# Patient Record
Sex: Female | Born: 1935 | Race: White | Hispanic: No | Smoking: Never smoker
Health system: Southern US, Community
[De-identification: ages and names within clinical notes are randomized; demographics above are authoritative.]

## PROBLEM LIST (undated history)

## (undated) DIAGNOSIS — H409 Unspecified glaucoma: Secondary | ICD-10-CM

## (undated) DIAGNOSIS — G4700 Insomnia, unspecified: Secondary | ICD-10-CM

## (undated) DIAGNOSIS — R1310 Dysphagia, unspecified: Secondary | ICD-10-CM

## (undated) DIAGNOSIS — E785 Hyperlipidemia, unspecified: Secondary | ICD-10-CM

## (undated) DIAGNOSIS — E039 Hypothyroidism, unspecified: Secondary | ICD-10-CM

## (undated) DIAGNOSIS — M542 Cervicalgia: Secondary | ICD-10-CM

## (undated) DIAGNOSIS — I639 Cerebral infarction, unspecified: Secondary | ICD-10-CM

## (undated) DIAGNOSIS — H9190 Unspecified hearing loss, unspecified ear: Secondary | ICD-10-CM

## (undated) DIAGNOSIS — M47812 Spondylosis without myelopathy or radiculopathy, cervical region: Secondary | ICD-10-CM

## (undated) DIAGNOSIS — IMO0001 Reserved for inherently not codable concepts without codable children: Secondary | ICD-10-CM

## (undated) DIAGNOSIS — K219 Gastro-esophageal reflux disease without esophagitis: Secondary | ICD-10-CM

## (undated) DIAGNOSIS — M47816 Spondylosis without myelopathy or radiculopathy, lumbar region: Secondary | ICD-10-CM

## (undated) DIAGNOSIS — I10 Essential (primary) hypertension: Secondary | ICD-10-CM

## (undated) DIAGNOSIS — I4891 Unspecified atrial fibrillation: Secondary | ICD-10-CM

## (undated) DIAGNOSIS — I4510 Unspecified right bundle-branch block: Secondary | ICD-10-CM

## (undated) DIAGNOSIS — R351 Nocturia: Secondary | ICD-10-CM

## (undated) DIAGNOSIS — M4180 Other forms of scoliosis, site unspecified: Secondary | ICD-10-CM

## (undated) DIAGNOSIS — G8929 Other chronic pain: Secondary | ICD-10-CM

## (undated) DIAGNOSIS — M1990 Unspecified osteoarthritis, unspecified site: Secondary | ICD-10-CM

## (undated) HISTORY — DX: Other chronic pain: G89.29

## (undated) HISTORY — DX: Other forms of scoliosis, site unspecified: M41.80

## (undated) HISTORY — PX: OTHER SURGICAL HISTORY: SHX169

## (undated) HISTORY — DX: Spondylosis without myelopathy or radiculopathy, cervical region: M47.812

## (undated) HISTORY — DX: Unspecified glaucoma: H40.9

## (undated) HISTORY — PX: BACK SURGERY: SHX140

## (undated) HISTORY — DX: Unspecified right bundle-branch block: I45.10

## (undated) HISTORY — DX: Unspecified hearing loss, unspecified ear: H91.90

## (undated) HISTORY — PX: BREAST SURGERY: SHX581

## (undated) HISTORY — DX: Spondylosis without myelopathy or radiculopathy, lumbar region: M47.816

## (undated) HISTORY — DX: Essential (primary) hypertension: I10

## (undated) HISTORY — DX: Insomnia, unspecified: G47.00

## (undated) HISTORY — DX: Hypothyroidism, unspecified: E03.9

## (undated) HISTORY — DX: Cerebral infarction, unspecified: I63.9

## (undated) HISTORY — DX: Nocturia: R35.1

## (undated) HISTORY — DX: Cervicalgia: M54.2

---

## 1998-07-08 HISTORY — PX: OTHER SURGICAL HISTORY: SHX169

## 1999-05-04 ENCOUNTER — Ambulatory Visit (HOSPITAL_BASED_OUTPATIENT_CLINIC_OR_DEPARTMENT_OTHER): Admission: RE | Admit: 1999-05-04 | Discharge: 1999-05-05 | Payer: Self-pay | Admitting: Plastic Surgery

## 2000-09-15 ENCOUNTER — Other Ambulatory Visit: Admission: RE | Admit: 2000-09-15 | Discharge: 2000-09-15 | Payer: Self-pay | Admitting: Internal Medicine

## 2000-09-19 ENCOUNTER — Encounter: Payer: Self-pay | Admitting: Ophthalmology

## 2000-09-23 ENCOUNTER — Ambulatory Visit (HOSPITAL_COMMUNITY): Admission: RE | Admit: 2000-09-23 | Discharge: 2000-09-23 | Payer: Self-pay | Admitting: Ophthalmology

## 2001-03-16 ENCOUNTER — Encounter: Admission: RE | Admit: 2001-03-16 | Discharge: 2001-03-16 | Payer: Self-pay | Admitting: Internal Medicine

## 2001-03-16 ENCOUNTER — Encounter: Payer: Self-pay | Admitting: Internal Medicine

## 2001-12-10 ENCOUNTER — Encounter: Admission: RE | Admit: 2001-12-10 | Discharge: 2001-12-10 | Payer: Self-pay | Admitting: Internal Medicine

## 2001-12-10 ENCOUNTER — Encounter: Payer: Self-pay | Admitting: Internal Medicine

## 2001-12-16 ENCOUNTER — Other Ambulatory Visit: Admission: RE | Admit: 2001-12-16 | Discharge: 2001-12-16 | Payer: Self-pay | Admitting: Gynecology

## 2002-06-07 ENCOUNTER — Encounter: Payer: Self-pay | Admitting: Urology

## 2002-06-08 ENCOUNTER — Encounter (INDEPENDENT_AMBULATORY_CARE_PROVIDER_SITE_OTHER): Payer: Self-pay

## 2002-06-08 ENCOUNTER — Ambulatory Visit (HOSPITAL_COMMUNITY): Admission: RE | Admit: 2002-06-08 | Discharge: 2002-06-08 | Payer: Self-pay | Admitting: Urology

## 2002-09-21 ENCOUNTER — Other Ambulatory Visit: Admission: RE | Admit: 2002-09-21 | Discharge: 2002-09-21 | Payer: Self-pay | Admitting: Internal Medicine

## 2003-01-19 ENCOUNTER — Encounter: Admission: RE | Admit: 2003-01-19 | Discharge: 2003-01-19 | Payer: Self-pay | Admitting: Neurosurgery

## 2003-01-19 ENCOUNTER — Encounter: Payer: Self-pay | Admitting: Neurosurgery

## 2003-02-16 ENCOUNTER — Encounter: Payer: Self-pay | Admitting: Neurosurgery

## 2003-02-21 ENCOUNTER — Inpatient Hospital Stay (HOSPITAL_COMMUNITY): Admission: RE | Admit: 2003-02-21 | Discharge: 2003-02-22 | Payer: Self-pay | Admitting: Neurosurgery

## 2003-02-21 ENCOUNTER — Encounter: Payer: Self-pay | Admitting: Neurosurgery

## 2003-03-24 ENCOUNTER — Encounter: Admission: RE | Admit: 2003-03-24 | Discharge: 2003-03-24 | Payer: Self-pay | Admitting: Neurosurgery

## 2003-03-24 ENCOUNTER — Encounter: Payer: Self-pay | Admitting: Neurosurgery

## 2003-06-09 ENCOUNTER — Encounter: Admission: RE | Admit: 2003-06-09 | Discharge: 2003-06-09 | Payer: Self-pay | Admitting: Neurosurgery

## 2004-10-11 ENCOUNTER — Other Ambulatory Visit: Admission: RE | Admit: 2004-10-11 | Discharge: 2004-10-11 | Payer: Self-pay | Admitting: Internal Medicine

## 2005-10-17 ENCOUNTER — Other Ambulatory Visit: Admission: RE | Admit: 2005-10-17 | Discharge: 2005-10-17 | Payer: Self-pay | Admitting: Internal Medicine

## 2007-11-16 ENCOUNTER — Other Ambulatory Visit: Admission: RE | Admit: 2007-11-16 | Discharge: 2007-11-16 | Payer: Self-pay | Admitting: Internal Medicine

## 2008-11-23 ENCOUNTER — Ambulatory Visit (HOSPITAL_COMMUNITY): Admission: RE | Admit: 2008-11-23 | Discharge: 2008-11-23 | Payer: Self-pay | Admitting: Gastroenterology

## 2010-05-23 ENCOUNTER — Other Ambulatory Visit: Admission: RE | Admit: 2010-05-23 | Discharge: 2010-05-23 | Payer: Self-pay | Admitting: Internal Medicine

## 2010-11-20 NOTE — Op Note (Signed)
[name], Keith                ACCOUNT NO.:  [id number]   MEDICAL RECORD NO.:  [id number]          PATIENT TYPE:  AMB   LOCATION:  ENDO                         FACILITY:  [facility]   PHYSICIAN:  Danise Edge, M.D.   DATE OF BIRTH:  [birth year]   DATE OF PROCEDURE:  DATE OF DISCHARGE:  11/23/2008                               OPERATIVE REPORT   REFERRING PHYSICIAN:  Candyce Churn, MD   PROCEDURE INDICATIONS:  [name] is a 77 year old female born  1929/09/11.  Ms. Holtrop has undergone an  esophagogastroduodenoscopy and diagnosed with an esophageal stricture in  the past.  She has developed esophageal dysphagia.   CHRONIC MEDICATION:  Levothyroxine, simvastatin, omeprazole, Diovan HCT,  Oxybutynin, Cosopt eye drops, Xalatan eye drops and Osteo Bi-Flex.   PAST MEDICAL HISTORY:  1. Hypercholesterolemia.  2. Hypertension.  3. Hypothyroidism.  4. Severe hearing loss.  5. Gastroesophageal reflux disease complicated by an esophageal      stricture.  6. Lumbar degenerative joint disease.  7. Glaucoma.  8. Laser surgery to the bladder to treat nocturia.   PAST SURGICAL HISTORY:  Bilateral saline breast implants, left carpal  tunnel surgery, facelift surgery, bilateral cataract surgery, lumbar  disk surgery, laser surgery of the bladder to treat nocturia.   MEDICATION ALLERGIES:  LIPITOR and ACE INHIBITORS.   ENDOSCOPIST:  Danise Edge, MD   PREMEDICATIONS:  Versed 2.5 mg and fentanyl 25 mcg.   PROCEDURE:  After obtaining informed consent, the patient was placed in  a left lateral decubitus position on the fluoroscopy table.  I  administered intravenous fentanyl and intravenous Versed to achieve  conscious sedation for the procedure.  The patient's blood pressure,  oxygen saturation and cardiac rhythm were monitored throughout the  procedure and documented in the medical record.   A Pentax gastroscope was passed through the posterior hypopharynx into  the  proximal esophagus without difficulty.  The hypopharynx, larynx and  vocal cords appeared normal.   ESOPHAGOSCOPY:  The proximal and mid segments of the esophagus appeared  normal.  At the esophagogastric junction (35 cm from the incisor teeth),  there is a short, benign-appearing peptic stricture.  There is no  endoscopic evidence for the presence of erosive esophagitis, Barrett  esophagus or esophageal tumor formation.   GASTROSCOPY:  There is a moderate-sized hiatal hernia.  Retroflex view  of the gastric cardia and fundus reveals a patulous diaphragmatic  hiatus.  The gastric body, antrum and pylorus appear normal.  The  pylorus is patent.   DUODENOSCOPY:  The duodenal bulb appears normal.  I was unable to  examine the second portion of the duodenum.   SAVARY ESOPHAGEAL DILATION:  The Savary dilator wire was passed through  the gastroscope and the tip of the guidewire advanced to the distal  gastric antrum.  Under fluoroscopic guidance, the 15-mm Savary dilator  passed without resistance.  Repeat esophagogastrostomy confirmed  satisfactory dilation of the benign peptic stricture at the  esophagogastric junction and no gastric trauma due to the guidewire.   ASSESSMENT:  Gastroesophageal reflux disease associated with a  moderate-  sized hiatal hernia and associated with a benign peptic stricture at the  esophagogastric junction (35 cm from the incisor teeth) dilated with the  15-mm Savary dilator.  There is no endoscopic evidence for the presence  of erosive esophagitis or Barrett esophagus.  The patient is on a proton  pump inhibitor.           ______________________________  Danise Edge, M.D.     MJ/MEDQ  D:  11/23/2008  T:  11/23/2008  Job:  [id number]   cc:   Candyce Churn, M.D.

## 2010-11-23 NOTE — Op Note (Signed)
NAME:  Angela Keith, Angela Keith                          ACCOUNT NO.:  [id number]   MEDICAL RECORD NO.:  [id number]                   PATIENT TYPE:  AMB   LOCATION:  DAY                                  [facility]ACILITY[facility]  [facility]   PHYSICIAN:  Jamison Neighbor, M.D.               DATE OF BIRTH:  Aug 08, 1929   DATE OF PROCEDURE:  06/08/2002  DATE OF DISCHARGE:                                 OPERATIVE REPORT   SERVICE:  Urology.   PREOPERATIVE DIAGNOSES:  Bladder lesion, rule out carcinoma in situ.   POSTOPERATIVE DIAGNOSES:  Bladder lesion, rule out carcinoma in situ.   PROCEDURE:  Cystoscopy, TURB.   SURGEON:  Jamison Neighbor, M.D.   ANESTHESIA:  General.   COMPLICATIONS:  None.   DRAINS:  Foley catheter.   BRIEF HISTORY:  This 77 year old female underwent a cystoscopic evaluation  because of hematuria. She was found to have a flat lesion on the right side  of the bladder back of the trigone. This was very flat and was felt to be  consistent with possible carcinoma in situ. The patient to undergo biopsy  and fulguration. She understands the risks and benefits of the procedure and  gave full and informed consent.   DESCRIPTION OF PROCEDURE:  After successful induction of general anesthesia,  the patient was placed in dorsal lithotomy position, prepped with Betadine  and draped in the usual sterile fashion. The bladder was carefully inspected  once again and the only area in question was the reddened patch on the right  hand side of the bladder. This appeared somewhat larger than it did when it  was evaluated with flexible cystoscope. There were no pedunculated tumors  and there were no other reddened patches anywhere else within the bladder.  The patient underwent resection of this area, the edges were carefully  fulgurated and all the reddened area was adequately resected. It was clear  that the resection had gone down into deeper tissues and because some fat  was seen it was felt the  patient should have a catheter for several days. A  Foley catheter was inserted after the chips had been irrigated. This was  placed to straight  drainage and ran freely. The patient tolerated the procedure well and was  taken to the recovery room in good condition. She will be sent home with  Darvocet-N 100, UroMax and Macrodantin one daily and will return to see [place]  in follow-up next week for catheter removal.                                               Jamison Neighbor, M.D.    RJE/MEDQ  D:  06/08/2002  T:  06/08/2002  Job:  [id number]   cc:   Maryln Gottron  Kevan Ny, M.D.  [address] [place]  [place] [address]  Fax: [phone]

## 2010-11-23 NOTE — Op Note (Signed)
NAME:  Angela Keith, Angela Keith                          ACCOUNT NO.:  [id number]   MEDICAL RECORD NO.:  [id number]                   PATIENT TYPE:  INP   LOCATION:  3172                                 FACILITY:  [facility]   PHYSICIAN:  Kathaleen Maser. Pool, M.D.                 DATE OF BIRTH:  01-28-1930   DATE OF PROCEDURE:  02/21/2003  DATE OF DISCHARGE:                                 OPERATIVE REPORT   PREOPERATIVE DIAGNOSES:  L4-5 degenerative spondylolisthesis with stenosis.   POSTOPERATIVE DIAGNOSES:  L4-5 degenerative spondylolisthesis with stenosis.   OPERATION PERFORMED:  L4-5 decompressive laminectomy with foraminotomies.  L4-5 posterior lumbar interbody fusion utilizing tangent wedges of local  autograft.  L4-5 posterolateral fusion utilizing pedicle screw  instrumentation and local autograft.   SURGEON:  Kathaleen Maser. Pool, M.D.   ASSISTANT:  Reinaldo Meeker, M.D.   ANESTHESIA:  General endotracheal.   INDICATIONS FOR PROCEDURE:  Angela Keith is a 77 year old female with a history  of severe back and bilateral lower extremity pain, right much greater than  left failing conservative management.  Work-up demonstrated evidence of  multiple level degenerative disk disease.  At L4-5 she has evidence of a  worsening grade 2 degenerative spondylolisthesis with severe stenosis.  The  patient presents now for decompression and fusion at the L4-5 level for  hopeful improvement in her symptoms.   DESCRIPTION OF PROCEDURE:  The patient was taken to the operating room and  placed on the table in the supine position.  After adequate level of  anesthesia was achieved, the patient was positioned prone onto a Wilson  frame and appropriately padded.  The patient's lumbar region was prepped and  draped sterilely.  A 10 blade was used to make a linear skin incision  overlying the L3, 4, and 5 levels.  This was carried down sharply in the  midline.  A subperiosteal dissection was then performed exposing the  lamina  and facet joints of L3, L4 and L5 as well as the transverse processes of L4  and L5.  Deep self-retaining retractor was placed.  Intraoperative x-ray was  taken, the levels were confirmed.  Decompressive laminectomy was then  performed at L4-5 utilizing Leksell rongeurs, Kerrison rongeurs and high  speed drill to completely remove the lamina at L4, remove the superior one  third of  the lamina at L5, completely remove the inferior facets at L4 and  completely remove the superior facets of L5.  All bone was cleaned and used  in later autografting.  The ligamentum flavum was then elevated and resected  in piecemeal fashion using Kerrison rongeurs.  The underlying thecal sac and  exiting L4 and L5 nerve roots were identified.  A wide foraminotomy was  performed along their courses.  Starting first at L4-5, epidural venous  plexus was coagulated and cut.  Thecal sac and nerve roots were protected on  the right.  Disk space was incised with a 15 blade in rectangular fashion.  A wide disk space cleanout was then achieved using pituitary rongeurs and  upward angled pituitary rongeurs and Epstein curets.  The procedure was then  repeated on the contralateral side.  Disk space was then sequentially  dilated up to 10 mm.  A 10 mm distractor was left on the right.  The nerve  root was protected on the left.  The disk space was then reamed and then cut  with 10 mm tangent instruments.  Soft tissue was removed from the  interspace.  A 10 x 26 mm tangent wedge was then impacted in place and  recessed approximately 1 mm from the posterior cortical surface.  Distractor  was removed from the contralateral side.  The thecal sac and nerve roots  were then protected on the right.  Disk space once again reamed and then  cut.  Soft tissue was removed.  Disk space further curettaged.  Morcelized  autograft was then packed into the interspace and then a second tangent  wedge was also packed and recessed  approximately 2 mm from the posterior  cortical margin.  The patient's spondylolisthesis was markedly reduced.  Pedicles at L4 and L5 were then isolated using surface landmarks and  intraoperative fluoroscopy.  Superficial bone overlying the pedicle was  removed using the high speed drill.  Each pedicle was then probed using  pedicle awls.  Each pedicle awl tract was found to be solidly within bone.  Each pedicle awl tract was then tapped with a 5.2 mm screw tap.  Each screw  tap hole was then probed and found to be solidly within bone.  6.75 x 45 mm  spiral 90 screws were placed bilaterally at L4.  6.75 x 40 mm screws were  placed bilaterally at L5.  Transverse processes at L4 and L5 were then  decorticated using a high speed drill.  Morcelized autograft was packed  posterolaterally.  Short segment titanium rod was then placed over the screw  heads of L4 and L5.  Locking caps were then placed over the screw heads.  The caps were then engaged in sequential fashion with the construct under  compression.  Gelfoam and a medium Hemovac drain was then left in the  interspace.  Prior to this a blunt probe was passed easily along the course  of the exiting nerve roots.  There was no evidence of injury to thecal sac  or nerve roots.  Final images revealed good position of bone grafts and  hardware at the proper operative level and normal alignment of the spine.  The wound was then irrigated one final time.  Then closed in routine  fashion.  Steri-Strips and sterile dressing were applied.  There were no  apparent complications.  The patient tolerated the procedure well and  returned to the recovery room postoperatively.                                                Henry A. Pool, M.D.    HAP/MEDQ  D:  02/21/2003  T:  02/21/2003  Job:  [id number]

## 2010-11-23 NOTE — Op Note (Signed)
[place]  Patient:    Angela Keith, Angela Keith                       MRN: [id number] Proc. Date: 09/23/00 Adm. Date:  [id number] Disc. Date: [id number] Attending:  Ivor Messier CC:         [name], M.D.   Operative Report  PREOPERATIVE DIAGNOSIS: Senile cataract, left eye.  POSTOPERATIVE DIAGNOSIS: Senile cataract, left eye.  OPERATION/PROCEDURE: Planned extracapsular cataract extraction with primary insertion of posterior chamber intraocular lens implant.  SURGEON: Guadelupe Sabin, M.D.  ASSISTANT: Nurse.  ANESTHESIA: Local 4% xylocaine, 0.75% Marcaine.  Anesthesia standby required. The patient was given sodium pentothal during the period of retrobulbar injection.  DESCRIPTION OF PROCEDURE: After the patient was prepped and draped a speculum was inserted in the left eye.  The eye was turned downward and a superior rectus traction suture placed.  Schiotz tonometry was recorded at 6-7 scale units with a 5.5 g weight.  A peritomy was performed adjacent to the limbus from the 11 oclock to the one oclock position.  The corneoscleral junction was cleaned and a corneoscleral groove made with a 45 degree Superblade.  The anterior chamber was then entered with the 2.5 mm diamond keratome.  The patient then had a circular capsulorrhexis performed with the Grabow forceps. Hydrodissection and hydrodelineation were performed using 1% xylocaine.  The 30 degree phacoemulsification tip was then inserted with slow emulsification of the lens nucleus.  Total ultrasonic time was one minute and 36 seconds. Average power level was ? 42%.  Fluid used was 80 ccs.  Following removal of the nucleus the residual cortex was aspirated with the irrigation-aspiration tip.  The posterior capsule appeared intact with a brilliant red fundus reflex.  It was therefore elected to insert an [employer] SI40MB silicone three-piece posterior chamber  intraocular lens implant with UV absorber.  Diopter strength was +25.00.  This was inserted with the [employer] forceps into the anterior chamber and then into the capsular bag with a Lister lens rotator.  The lens appeared to be well centered.  The Healon which had been used during the procedure was then aspirated and replaced with balanced salt solution and Miochol ophthalmic solution.  Maxitrol ointment was instilled in the conjunctival cul-de-sac and a light patch and protective shield applied.  Duration of the procedure and anesthesia administration was 45 minutes.  The patient tolerated the procedure well in general and left the operating room for the recovery room in good condition. DD:  09/23/00 TD:  09/23/00 Job: [id number]

## 2010-11-23 NOTE — H&P (Signed)
[place]  Patient:    Angela Keith                       MRN: [id number] Adm. Date:  [id number] Disc. Date: [id number] Attending:  Ivor Messier CC:         [name], M.D.   History and Physical  CHIEF COMPLAINT: This was a planned outpatient surgical admission of this 77 year old white female, admitted for cataract implant surgery of the left eye.  HISTORY OF PRESENT ILLNESS: This patient has noted gradual deterioration of vision in both eyes due to cataract formation.  The patient also has chronic open angle glaucoma in both eyes.  The patient has undergone previous cataract implant surgery by Dr. Antony Contras at the surgical center on Nov 26, 1999. The patient tolerated this procedure well and has done well since the operative procedure.  The unoperated eye has slowly deteriorated to 20/50 and the patient has elected to proceed with similar surgery of the left eye at this time.  She signed an informed consent and arrangements were made for her outpatient admission.  PAST MEDICAL HISTORY: The patient is in stable general health, under the care of her regular physician, Dr. Robley Fries.  She is said to be in satisfactory condition for the proposed surgery.  Dr. Kevan Ny notes the patients allergy to LIPITOR.  He is following the patient for hypothyroidism, hyperlipidemia, gastric reflux syndrome, history of right bundle branch block, and severe hearing loss.  ALLERGIES: LIPITOR.  REVIEW OF SYSTEMS: No cardiorespiratory complaints.  PHYSICAL EXAMINATION:  VITAL SIGNS: As recorded on admission, blood pressure was 156/76, temperature 97.6 degrees, heart rate 60.  GENERAL: The patient is a pleasant, well-developed, well-nourished white female in no acute distress.  HEENT: Ocular examination shows the eyes are white and clear.  Slit lamp examination reveals nuclear cataract formation in the left eye and a  clear posterior chamber intraocular lens implant in the right eye.  Applanation tonometry is stable at 19 mm in each eye.  Funduscopic examination reveals a clear vitreous and attached retina.  The optic nerve shows glaucomatous cupping, disc cup ratio 0.08 right eye and 0.06 left eye.  The blood vessels, macula, and retina appear normal.  CHEST: Lungs clear to auscultation and percussion.  HEART: Normal sinus rhythm.  No cardiomegaly.  No murmurs.  ABDOMEN: Negative.  EXTREMITIES: Negative.  ADMISSION DIAGNOSIS:  1. Senile cataract, left eye.  2. Pseudophakia, right eye.  SURGICAL PLAN: Cataract implant surgery, left eye. DD:  09/23/00 TD:  09/23/00 Job: [id number]

## 2011-07-11 DIAGNOSIS — Z1231 Encounter for screening mammogram for malignant neoplasm of breast: Secondary | ICD-10-CM | POA: Diagnosis not present

## 2011-11-20 DIAGNOSIS — E039 Hypothyroidism, unspecified: Secondary | ICD-10-CM | POA: Diagnosis not present

## 2011-11-20 DIAGNOSIS — I10 Essential (primary) hypertension: Secondary | ICD-10-CM | POA: Diagnosis not present

## 2011-11-20 DIAGNOSIS — E7800 Pure hypercholesterolemia, unspecified: Secondary | ICD-10-CM | POA: Diagnosis not present

## 2011-11-20 DIAGNOSIS — H9190 Unspecified hearing loss, unspecified ear: Secondary | ICD-10-CM | POA: Diagnosis not present

## 2011-12-19 DIAGNOSIS — R3915 Urgency of urination: Secondary | ICD-10-CM | POA: Diagnosis not present

## 2011-12-19 DIAGNOSIS — Z1331 Encounter for screening for depression: Secondary | ICD-10-CM | POA: Diagnosis not present

## 2011-12-19 DIAGNOSIS — R350 Frequency of micturition: Secondary | ICD-10-CM | POA: Diagnosis not present

## 2011-12-19 DIAGNOSIS — M5430 Sciatica, unspecified side: Secondary | ICD-10-CM | POA: Diagnosis not present

## 2012-02-12 DIAGNOSIS — H4011X0 Primary open-angle glaucoma, stage unspecified: Secondary | ICD-10-CM | POA: Diagnosis not present

## 2012-02-12 DIAGNOSIS — H409 Unspecified glaucoma: Secondary | ICD-10-CM | POA: Diagnosis not present

## 2012-02-24 DIAGNOSIS — H4011X0 Primary open-angle glaucoma, stage unspecified: Secondary | ICD-10-CM | POA: Diagnosis not present

## 2012-02-24 DIAGNOSIS — H353190 Nonexudative age-related macular degeneration, unspecified eye, stage unspecified: Secondary | ICD-10-CM | POA: Diagnosis not present

## 2012-03-11 DIAGNOSIS — M1710 Unilateral primary osteoarthritis, unspecified knee: Secondary | ICD-10-CM | POA: Diagnosis not present

## 2012-03-16 DIAGNOSIS — R079 Chest pain, unspecified: Secondary | ICD-10-CM | POA: Diagnosis not present

## 2012-04-01 DIAGNOSIS — I4510 Unspecified right bundle-branch block: Secondary | ICD-10-CM | POA: Diagnosis not present

## 2012-04-01 DIAGNOSIS — R609 Edema, unspecified: Secondary | ICD-10-CM | POA: Diagnosis not present

## 2012-04-01 DIAGNOSIS — I10 Essential (primary) hypertension: Secondary | ICD-10-CM | POA: Diagnosis not present

## 2012-04-01 DIAGNOSIS — R072 Precordial pain: Secondary | ICD-10-CM | POA: Diagnosis not present

## 2012-04-01 DIAGNOSIS — E782 Mixed hyperlipidemia: Secondary | ICD-10-CM | POA: Diagnosis not present

## 2012-04-08 DIAGNOSIS — R072 Precordial pain: Secondary | ICD-10-CM | POA: Diagnosis not present

## 2012-04-08 DIAGNOSIS — I4510 Unspecified right bundle-branch block: Secondary | ICD-10-CM | POA: Diagnosis not present

## 2012-04-08 DIAGNOSIS — I10 Essential (primary) hypertension: Secondary | ICD-10-CM | POA: Diagnosis not present

## 2012-04-08 DIAGNOSIS — R609 Edema, unspecified: Secondary | ICD-10-CM | POA: Diagnosis not present

## 2012-04-08 DIAGNOSIS — E782 Mixed hyperlipidemia: Secondary | ICD-10-CM | POA: Diagnosis not present

## 2012-04-13 DIAGNOSIS — E782 Mixed hyperlipidemia: Secondary | ICD-10-CM | POA: Diagnosis not present

## 2012-04-13 DIAGNOSIS — Z0000 Encounter for general adult medical examination without abnormal findings: Secondary | ICD-10-CM | POA: Diagnosis not present

## 2012-04-13 DIAGNOSIS — R1310 Dysphagia, unspecified: Secondary | ICD-10-CM | POA: Diagnosis not present

## 2012-04-13 DIAGNOSIS — Z23 Encounter for immunization: Secondary | ICD-10-CM | POA: Diagnosis not present

## 2012-04-13 DIAGNOSIS — I4510 Unspecified right bundle-branch block: Secondary | ICD-10-CM | POA: Diagnosis not present

## 2012-04-13 DIAGNOSIS — I10 Essential (primary) hypertension: Secondary | ICD-10-CM | POA: Diagnosis not present

## 2012-04-13 DIAGNOSIS — M1710 Unilateral primary osteoarthritis, unspecified knee: Secondary | ICD-10-CM | POA: Diagnosis not present

## 2012-04-13 DIAGNOSIS — R072 Precordial pain: Secondary | ICD-10-CM | POA: Diagnosis not present

## 2012-06-01 ENCOUNTER — Other Ambulatory Visit: Payer: Self-pay | Admitting: Internal Medicine

## 2012-06-01 ENCOUNTER — Other Ambulatory Visit (HOSPITAL_COMMUNITY)
Admission: RE | Admit: 2012-06-01 | Discharge: 2012-06-01 | Disposition: A | Discharge status: Home / Self Care | Payer: Medicare Other | Source: Ambulatory Visit | Attending: Internal Medicine | Admitting: Internal Medicine

## 2012-06-01 DIAGNOSIS — Z1151 Encounter for screening for human papillomavirus (HPV): Secondary | ICD-10-CM | POA: Insufficient documentation

## 2012-06-01 DIAGNOSIS — I4510 Unspecified right bundle-branch block: Secondary | ICD-10-CM | POA: Diagnosis not present

## 2012-06-01 DIAGNOSIS — Z01419 Encounter for gynecological examination (general) (routine) without abnormal findings: Secondary | ICD-10-CM | POA: Diagnosis not present

## 2012-06-01 DIAGNOSIS — E782 Mixed hyperlipidemia: Secondary | ICD-10-CM | POA: Diagnosis not present

## 2012-06-01 DIAGNOSIS — R1310 Dysphagia, unspecified: Secondary | ICD-10-CM | POA: Diagnosis not present

## 2012-06-01 DIAGNOSIS — Z0000 Encounter for general adult medical examination without abnormal findings: Secondary | ICD-10-CM | POA: Diagnosis not present

## 2012-06-01 DIAGNOSIS — Z1331 Encounter for screening for depression: Secondary | ICD-10-CM | POA: Diagnosis not present

## 2012-06-01 DIAGNOSIS — E039 Hypothyroidism, unspecified: Secondary | ICD-10-CM | POA: Diagnosis not present

## 2012-06-01 DIAGNOSIS — Z79899 Other long term (current) drug therapy: Secondary | ICD-10-CM | POA: Diagnosis not present

## 2012-06-01 DIAGNOSIS — I10 Essential (primary) hypertension: Secondary | ICD-10-CM | POA: Diagnosis not present

## 2012-06-01 DIAGNOSIS — R072 Precordial pain: Secondary | ICD-10-CM | POA: Diagnosis not present

## 2012-06-01 DIAGNOSIS — M1710 Unilateral primary osteoarthritis, unspecified knee: Secondary | ICD-10-CM | POA: Diagnosis not present

## 2012-06-08 ENCOUNTER — Ambulatory Visit (HOSPITAL_COMMUNITY)
Admission: RE | Admit: 2012-06-08 | Discharge: 2012-06-08 | Disposition: A | Discharge status: Home / Self Care | Payer: Medicare Other | Source: Ambulatory Visit | Attending: Gastroenterology | Admitting: Gastroenterology

## 2012-06-08 ENCOUNTER — Encounter (HOSPITAL_COMMUNITY)
Admission: RE | Disposition: A | Discharge status: Home / Self Care | Payer: Self-pay | Source: Ambulatory Visit | Attending: Gastroenterology

## 2012-06-08 ENCOUNTER — Encounter (HOSPITAL_COMMUNITY): Payer: Self-pay | Admitting: *Deleted

## 2012-06-08 DIAGNOSIS — Z79899 Other long term (current) drug therapy: Secondary | ICD-10-CM | POA: Diagnosis not present

## 2012-06-08 DIAGNOSIS — H409 Unspecified glaucoma: Secondary | ICD-10-CM | POA: Diagnosis not present

## 2012-06-08 DIAGNOSIS — K222 Esophageal obstruction: Secondary | ICD-10-CM | POA: Insufficient documentation

## 2012-06-08 DIAGNOSIS — I10 Essential (primary) hypertension: Secondary | ICD-10-CM | POA: Diagnosis not present

## 2012-06-08 DIAGNOSIS — K449 Diaphragmatic hernia without obstruction or gangrene: Secondary | ICD-10-CM | POA: Insufficient documentation

## 2012-06-08 DIAGNOSIS — K219 Gastro-esophageal reflux disease without esophagitis: Secondary | ICD-10-CM | POA: Insufficient documentation

## 2012-06-08 DIAGNOSIS — Z7982 Long term (current) use of aspirin: Secondary | ICD-10-CM | POA: Diagnosis not present

## 2012-06-08 DIAGNOSIS — E7800 Pure hypercholesterolemia, unspecified: Secondary | ICD-10-CM | POA: Insufficient documentation

## 2012-06-08 DIAGNOSIS — E039 Hypothyroidism, unspecified: Secondary | ICD-10-CM | POA: Insufficient documentation

## 2012-06-08 HISTORY — DX: Hypothyroidism, unspecified: E03.9

## 2012-06-08 HISTORY — DX: Gastro-esophageal reflux disease without esophagitis: K21.9

## 2012-06-08 HISTORY — DX: Hyperlipidemia, unspecified: E78.5

## 2012-06-08 HISTORY — DX: Dysphagia, unspecified: R13.10

## 2012-06-08 HISTORY — PX: ESOPHAGOGASTRODUODENOSCOPY (EGD) WITH ESOPHAGEAL DILATION: SHX5812

## 2012-06-08 HISTORY — DX: Unspecified osteoarthritis, unspecified site: M19.90

## 2012-06-08 SURGERY — ESOPHAGOGASTRODUODENOSCOPY (EGD) WITH ESOPHAGEAL DILATION
Anesthesia: Moderate Sedation

## 2012-06-08 MED ORDER — SODIUM CHLORIDE 0.9 % IV SOLN: INTRAVENOUS | Status: DC

## 2012-06-08 MED ORDER — SODIUM CHLORIDE 0.9 % IV SOLN
INTRAVENOUS | Status: DC
  Administered 2012-06-08: 500 mL via INTRAVENOUS

## 2012-06-08 MED ORDER — BUTAMBEN-TETRACAINE-BENZOCAINE 2-2-14 % EX AERO
INHALATION_SPRAY | CUTANEOUS | Status: DC | PRN
  Administered 2012-06-08: 1 via TOPICAL

## 2012-06-08 MED ORDER — FENTANYL CITRATE 0.05 MG/ML IJ SOLN
INTRAMUSCULAR | Status: DC | PRN
  Administered 2012-06-08: 25 ug via INTRAVENOUS

## 2012-06-08 MED ORDER — MIDAZOLAM HCL 10 MG/2ML IJ SOLN
INTRAMUSCULAR | Status: AC
  Filled 2012-06-08: qty 2

## 2012-06-08 MED ORDER — FENTANYL CITRATE 0.05 MG/ML IJ SOLN
INTRAMUSCULAR | Status: AC
  Filled 2012-06-08: qty 2

## 2012-06-08 MED ORDER — MIDAZOLAM HCL 10 MG/2ML IJ SOLN
INTRAMUSCULAR | Status: DC | PRN
  Administered 2012-06-08: 1 mg via INTRAVENOUS
  Administered 2012-06-08: 1.5 mg via INTRAVENOUS
  Administered 2012-06-08: 1 mg via INTRAVENOUS

## 2012-06-08 NOTE — H&P (Signed)
  Problem: Esophageal dysphagia associated with a benign distal esophageal stricture.  History: The patient is a 78 year old female born 07-Feb-1930.  The patient has a benign distal esophageal stricture which last required esophageal dilation in May 2010. There is no history of Barrett's esophagus.  The patient has developed solid food esophageal dysphagia without odynophagia. She is taking a proton pump inhibitor.  The patient is scheduled to undergo diagnostic esophagogastroduodenoscopy with balloon dilation of the distal esophageal stricture.  Medication allergies: Lipitor causes mood swings. ACE inhibitors cause fatigue.  Chronic medication: Aspirin. Calcium. Vitamin D. Oxybutynin. Levothyroxin. Ambien. Nasonex. Xalatan. Cosopt. Omeprazole. Nitroglycerin. Metoprolol. Diovan.  Past medical and surgical history: Hypercholesterolemia. Hypertension. Hypothyroidism. Bilateral hearing loss. Distal esophageal stricture associated with gastroesophageal reflux. Lumbar degenerative joint disease. Lumbar surgery. Glaucoma. Insomnia. Bilateral saline breast implants. Left carpal tunnel surgery. Facelift and eyelid surgery. Bilateral cataract surgery. Laser surgery of the urinary bladder.  Exam: The patient is alert and lying comfortably on the endoscopy stretcher. Cardiac exam reveals a regular rhythm. Lungs are clear to auscultation. Abdomen is soft, flat, and nontender to palpation in all quadrants.  Plan: Proceed with diagnostic esophagogastroduodenoscopy and dilation of the benign distal esophageal stricture.

## 2012-06-08 NOTE — Op Note (Signed)
Procedure: Diagnostic esophagogastroduodenoscopy with balloon dilation of a distal esophageal Schatzki's ring at the esophagogastric junction.  Endoscopist: Danise Edge  Premedication: Fentanyl 25 mcg intravenously. Versed 3.5 mg intravenously. Cetacaine spray.  Procedure: The patient was placed in the left lateral decubitus position. The Pentax gastroscope was passed through the posterior hypopharynx into the proximal esophagus without difficulty. The hypopharynx, larynx, and vocal cords appeared normal.  Esophagoscopy: The proximal and mid segments of the esophageal mucosa appear normal. The squamocolumnar junction is regular and noted at 30 cm from the incisor teeth. There is a shallow Schatzki's ring at the esophagogastric junction. There is no endoscopic evidence for the presence of erosive esophagitis, Barrett's esophagus, or esophageal cancer. Using the esophageal balloon dilator, the benign Schatzki's ring at the esophagogastric junction was dilated to 16.5 mm without apparent complications.  Gastroscopy: There is a large hiatal hernia. Retroflexed view of the gastric cardia and fundus was normal. The gastric body, antrum, and pylorus appeared normal. There were no Sheria Lang gastric erosions at the diaphragmatic hiatus.  Duodenoscopy: The duodenal bulb and descending duodenum appeared normal.  Assessment: Chronic gastroesophageal reflux associated with a large hiatal hernia and shallow Schatzki's ring at the esophagogastric junction dilated to 16.5 mm using the esophageal balloon dilator.

## 2012-06-09 ENCOUNTER — Encounter (HOSPITAL_COMMUNITY): Payer: Self-pay

## 2012-06-09 ENCOUNTER — Encounter (HOSPITAL_COMMUNITY): Payer: Self-pay | Admitting: Gastroenterology

## 2012-08-25 DIAGNOSIS — M1710 Unilateral primary osteoarthritis, unspecified knee: Secondary | ICD-10-CM | POA: Diagnosis not present

## 2012-09-07 DIAGNOSIS — H353190 Nonexudative age-related macular degeneration, unspecified eye, stage unspecified: Secondary | ICD-10-CM | POA: Diagnosis not present

## 2012-09-07 DIAGNOSIS — H4011X0 Primary open-angle glaucoma, stage unspecified: Secondary | ICD-10-CM | POA: Diagnosis not present

## 2012-10-28 DIAGNOSIS — J209 Acute bronchitis, unspecified: Secondary | ICD-10-CM | POA: Diagnosis not present

## 2012-10-29 ENCOUNTER — Inpatient Hospital Stay (HOSPITAL_BASED_OUTPATIENT_CLINIC_OR_DEPARTMENT_OTHER)
Admission: EM | Admit: 2012-10-29 | Discharge: 2012-10-31 | DRG: 391 | Disposition: A | Discharge status: Home / Self Care | Payer: Medicare Other | Attending: Internal Medicine | Admitting: Internal Medicine

## 2012-10-29 ENCOUNTER — Encounter (HOSPITAL_BASED_OUTPATIENT_CLINIC_OR_DEPARTMENT_OTHER): Payer: Self-pay | Admitting: Emergency Medicine

## 2012-10-29 DIAGNOSIS — E039 Hypothyroidism, unspecified: Secondary | ICD-10-CM | POA: Diagnosis present

## 2012-10-29 DIAGNOSIS — K219 Gastro-esophageal reflux disease without esophagitis: Secondary | ICD-10-CM | POA: Diagnosis present

## 2012-10-29 DIAGNOSIS — R112 Nausea with vomiting, unspecified: Secondary | ICD-10-CM | POA: Diagnosis not present

## 2012-10-29 DIAGNOSIS — Z79899 Other long term (current) drug therapy: Secondary | ICD-10-CM | POA: Diagnosis not present

## 2012-10-29 DIAGNOSIS — E860 Dehydration: Secondary | ICD-10-CM | POA: Diagnosis present

## 2012-10-29 DIAGNOSIS — E876 Hypokalemia: Secondary | ICD-10-CM | POA: Diagnosis not present

## 2012-10-29 DIAGNOSIS — R748 Abnormal levels of other serum enzymes: Secondary | ICD-10-CM | POA: Diagnosis not present

## 2012-10-29 DIAGNOSIS — K449 Diaphragmatic hernia without obstruction or gangrene: Secondary | ICD-10-CM | POA: Diagnosis not present

## 2012-10-29 DIAGNOSIS — Z7982 Long term (current) use of aspirin: Secondary | ICD-10-CM | POA: Diagnosis not present

## 2012-10-29 DIAGNOSIS — R1310 Dysphagia, unspecified: Secondary | ICD-10-CM | POA: Diagnosis present

## 2012-10-29 DIAGNOSIS — E785 Hyperlipidemia, unspecified: Secondary | ICD-10-CM | POA: Diagnosis present

## 2012-10-29 DIAGNOSIS — H9190 Unspecified hearing loss, unspecified ear: Secondary | ICD-10-CM | POA: Diagnosis present

## 2012-10-29 DIAGNOSIS — K859 Acute pancreatitis without necrosis or infection, unspecified: Secondary | ICD-10-CM | POA: Diagnosis not present

## 2012-10-29 DIAGNOSIS — M129 Arthropathy, unspecified: Secondary | ICD-10-CM | POA: Diagnosis present

## 2012-10-29 DIAGNOSIS — R197 Diarrhea, unspecified: Secondary | ICD-10-CM | POA: Diagnosis present

## 2012-10-29 DIAGNOSIS — I10 Essential (primary) hypertension: Secondary | ICD-10-CM | POA: Diagnosis present

## 2012-10-29 DIAGNOSIS — T368X5A Adverse effect of other systemic antibiotics, initial encounter: Secondary | ICD-10-CM | POA: Diagnosis present

## 2012-10-29 DIAGNOSIS — I4510 Unspecified right bundle-branch block: Secondary | ICD-10-CM | POA: Diagnosis not present

## 2012-10-29 DIAGNOSIS — R05 Cough: Secondary | ICD-10-CM | POA: Clinically undetermined

## 2012-10-29 LAB — COMPREHENSIVE METABOLIC PANEL
ALT: 21 U/L (ref 0–35)
AST: 24 U/L (ref 0–37)
Albumin: 4.2 g/dL (ref 3.5–5.2)
Alkaline Phosphatase: 62 U/L (ref 39–117)
Chloride: 103 mEq/L (ref 96–112)
Potassium: 3.6 mEq/L (ref 3.5–5.1)
Total Bilirubin: 0.4 mg/dL (ref 0.3–1.2)

## 2012-10-29 LAB — CBC WITH DIFFERENTIAL/PLATELET
Basophils Absolute: 0 10*3/uL (ref 0.0–0.1)
Basophils Relative: 0 % (ref 0–1)
Hemoglobin: 12.7 g/dL (ref 12.0–15.0)
Lymphocytes Relative: 11 % — ABNORMAL LOW (ref 12–46)
MCHC: 33.4 g/dL (ref 30.0–36.0)
Monocytes Relative: 4 % (ref 3–12)
Neutro Abs: 8.4 10*3/uL — ABNORMAL HIGH (ref 1.7–7.7)
Neutrophils Relative %: 84 % — ABNORMAL HIGH (ref 43–77)
RDW: 14.8 % (ref 11.5–15.5)
WBC: 9.9 10*3/uL (ref 4.0–10.5)

## 2012-10-29 MED ORDER — ONDANSETRON HCL 4 MG/2ML IJ SOLN
4.0000 mg | Freq: Once | INTRAMUSCULAR | Status: AC
  Administered 2012-10-29: 4 mg via INTRAVENOUS
  Filled 2012-10-29: qty 2

## 2012-10-29 MED ORDER — SODIUM CHLORIDE 0.9 % IV BOLUS (SEPSIS)
1000.0000 mL | Freq: Once | INTRAVENOUS | Status: AC
  Administered 2012-10-29: 1000 mL via INTRAVENOUS

## 2012-10-29 NOTE — ED Provider Notes (Signed)
History     CSN: [id number]  Arrival date & time 10/29/12  1941   First MD Initiated Contact with Patient 10/29/12 2008      Chief Complaint  Patient presents with  . Nausea  . Diarrhea  . Vomiting    (Consider location/radiation/quality/duration/timing/severity/associated sxs/prior treatment) HPI Comments: Patient brought by family for evaluation of a 12 hour history of n/v/d.  All have been non-bloody.  She denies abdominal pain.  No fevers or chills.  No urinary complaints.  Her husband passed away last week and she has had many family members from out of town, however none have been ill.    Patient is a 79 y.o. female presenting with vomiting. The history is provided by the patient.  Emesis Severity:  Moderate Duration:  12 hours Timing:  Constant Quality:  Stomach contents Progression:  Worsening Chronicity:  New Recent urination:  Normal Relieved by:  Nothing Worsened by:  Nothing tried Ineffective treatments:  None tried Associated symptoms: diarrhea   Associated symptoms: no abdominal pain, no chills, no fever and no headaches     Past Medical History  Diagnosis Date  . Hypothyroidism   . Arthritis   . GERD (gastroesophageal reflux disease)   . Dysphagia   . Hyperlipidemia   . Hypertension     Past Surgical History  Procedure Laterality Date  . Breast surgery    . Back surgery      lower back   . Esophagogastroduodenoscopy (egd) with esophageal dilation  06/08/2012    Procedure: ESOPHAGOGASTRODUODENOSCOPY (EGD) WITH ESOPHAGEAL DILATION;  Surgeon: Charolett Bumpers, MD;  Location: [facility];  Service: Endoscopy;  Laterality: N/A;    History reviewed. No pertinent family history.  History  Substance Use Topics  . Smoking status: Never Smoker   . Smokeless tobacco: Not on file  . Alcohol Use:     OB History   Grav Para Term Preterm Abortions TAB SAB Ect Mult Living                  Review of Systems  Constitutional: Negative for chills.   Gastrointestinal: Positive for vomiting and diarrhea. Negative for abdominal pain.  Neurological: Negative for headaches.  All other systems reviewed and are negative.    Allergies  Motrin  Home Medications   Current Outpatient Rx  Name  Route  Sig  Dispense  Refill  . Fluticasone-Salmeterol (ADVAIR) 100-50 MCG/DOSE AEPB   Inhalation   Inhale 1 puff into the lungs every 12 (twelve) hours.         Marland Kitchen levofloxacin (LEVAQUIN) 250 MG tablet   Oral   Take 250 mg by mouth daily.         Marland Kitchen aspirin 81 MG tablet   Oral   Take 81 mg by mouth daily.         . calcium carbonate (OS-CAL) 600 MG TABS   Oral   Take 600 mg by mouth daily.         . dorzolamide-timolol (COSOPT) 22.3-6.8 MG/ML ophthalmic solution   Both Eyes   Place 1 drop into both eyes 2 (two) times daily.         Marland Kitchen latanoprost (XALATAN) 0.005 % ophthalmic solution   Both Eyes   Place 1 drop into both eyes at bedtime.         Marland Kitchen levothyroxine (SYNTHROID, LEVOTHROID) 75 MCG tablet   Oral   Take 75 mcg by mouth daily.         Marland Kitchen  Multiple Vitamins-Minerals (ICAPS PO)   Oral   Take by mouth daily.         . nitroGLYCERIN (NITROSTAT) 0.4 MG SL tablet   Sublingual   Place 0.4 mg under the tongue as directed.         Marland Kitchen omeprazole (PRILOSEC) 20 MG capsule   Oral   Take 20 mg by mouth 2 (two) times daily.         Marland Kitchen oxybutynin (DITROPAN-XL) 10 MG 24 hr tablet   Oral   Take 10 mg by mouth daily.         . valsartan (DIOVAN) 80 MG tablet   Oral   Take 80 mg by mouth daily.           BP 153/70  Pulse 80  Temp(Src) 97.7 F (36.5 C) (Oral)  Resp 14  Ht 5\' 7"  (1.702 m)  Wt 160 lb (72.576 kg)  BMI 25.05 kg/m2  SpO2 98%  Physical Exam  Nursing note and vitals reviewed. Constitutional: She is oriented to person, place, and time. She appears well-developed and well-nourished. No distress.  She does appear somewhat pale.  HENT:  Head: Normocephalic and atraumatic.  Mouth/Throat:  Oropharynx is clear and moist.  Neck: Normal range of motion. Neck supple.  Cardiovascular: Normal rate and regular rhythm.  Exam reveals no gallop and no friction rub.   No murmur heard. Pulmonary/Chest: Effort normal and breath sounds normal. No respiratory distress. She has no wheezes.  Abdominal: Soft. Bowel sounds are normal. She exhibits no distension. There is no tenderness.  Musculoskeletal: Normal range of motion.  Neurological: She is alert and oriented to person, place, and time.  Skin: Skin is warm and dry. She is not diaphoretic.    ED Course  Procedures (including critical care time)  Labs Reviewed  CBC WITH DIFFERENTIAL  COMPREHENSIVE METABOLIC PANEL  LIPASE, BLOOD  URINALYSIS, ROUTINE W REFLEX MICROSCOPIC  TROPONIN I   No results found.   No diagnosis found.   Date: 10/29/2012  Rate: 78  Rhythm: normal sinus rhythm  QRS Axis: normal  Intervals: normal  ST/T Wave abnormalities: nonspecific T wave changes  Conduction Disutrbances:right bundle branch block  Narrative Interpretation:   Old EKG Reviewed: unchanged    MDM  The patient presents with epigastric pain for the past several weeks.  This has worsened over the past several days and now involves vomiting and some diarrhea.  The labs reveal an elevated lipase consistent with pancreatitis.  I have spoken with Dr. Toniann Fail from [facility] who agrees to accept the patient in transfer for treatment and further workup.  She will likely need a ct scan, but this will be ordered at the discretion of the admitting physician.  She was hydrated and medicated and is feeling somewhat better.         Geoffery Lyons, MD 10/30/12 [phone]

## 2012-10-29 NOTE — ED Notes (Signed)
Family reports patient vomiting since 230 yesterday, saw pcp yesterday, dx with bronchitis, family reports patient has been having severe epigastric pain for several weeks

## 2012-10-29 NOTE — ED Notes (Signed)
Per pt's family, pt started getting sick around 2/230 today. Per pt's family, pt is getting worse. Per pt's family, pt has not been able to keep any food/liquids down.

## 2012-10-29 NOTE — ED Notes (Signed)
Daughter dena notified of patients room assignment, provided carelink report

## 2012-10-30 ENCOUNTER — Inpatient Hospital Stay (HOSPITAL_COMMUNITY): Payer: Medicare Other

## 2012-10-30 ENCOUNTER — Encounter (HOSPITAL_COMMUNITY): Payer: Self-pay | Admitting: *Deleted

## 2012-10-30 DIAGNOSIS — I10 Essential (primary) hypertension: Secondary | ICD-10-CM

## 2012-10-30 DIAGNOSIS — R197 Diarrhea, unspecified: Secondary | ICD-10-CM | POA: Diagnosis not present

## 2012-10-30 DIAGNOSIS — R748 Abnormal levels of other serum enzymes: Secondary | ICD-10-CM

## 2012-10-30 DIAGNOSIS — E039 Hypothyroidism, unspecified: Secondary | ICD-10-CM

## 2012-10-30 DIAGNOSIS — K449 Diaphragmatic hernia without obstruction or gangrene: Secondary | ICD-10-CM | POA: Diagnosis not present

## 2012-10-30 DIAGNOSIS — K859 Acute pancreatitis without necrosis or infection, unspecified: Secondary | ICD-10-CM | POA: Diagnosis not present

## 2012-10-30 DIAGNOSIS — R112 Nausea with vomiting, unspecified: Secondary | ICD-10-CM | POA: Diagnosis not present

## 2012-10-30 HISTORY — DX: Hypothyroidism, unspecified: E03.9

## 2012-10-30 HISTORY — DX: Essential (primary) hypertension: I10

## 2012-10-30 LAB — CBC WITH DIFFERENTIAL/PLATELET
Eosinophils Absolute: 0 10*3/uL (ref 0.0–0.7)
Eosinophils Relative: 0 % (ref 0–5)
HCT: 34.2 % — ABNORMAL LOW (ref 36.0–46.0)
Hemoglobin: 11.8 g/dL — ABNORMAL LOW (ref 12.0–15.0)
Lymphs Abs: 0.6 10*3/uL — ABNORMAL LOW (ref 0.7–4.0)
MCH: 29.2 pg (ref 26.0–34.0)
MCV: 84.7 fL (ref 78.0–100.0)
Monocytes Absolute: 0.3 10*3/uL (ref 0.1–1.0)
Monocytes Relative: 5 % (ref 3–12)
RBC: 4.04 MIL/uL (ref 3.87–5.11)

## 2012-10-30 LAB — COMPREHENSIVE METABOLIC PANEL
AST: 24 U/L (ref 0–37)
Albumin: 3.6 g/dL (ref 3.5–5.2)
Calcium: 8.9 mg/dL (ref 8.4–10.5)
Chloride: 105 mEq/L (ref 96–112)
Creatinine, Ser: 0.79 mg/dL (ref 0.50–1.10)
Sodium: 142 mEq/L (ref 135–145)

## 2012-10-30 LAB — CLOSTRIDIUM DIFFICILE BY PCR: Toxigenic C. Difficile by PCR: NEGATIVE

## 2012-10-30 MED ORDER — ACETAMINOPHEN 650 MG RE SUPP: 650.0000 mg | Freq: Four times a day (QID) | RECTAL | Status: DC | PRN

## 2012-10-30 MED ORDER — ENOXAPARIN SODIUM 40 MG/0.4ML ~~LOC~~ SOLN
40.0000 mg | SUBCUTANEOUS | Status: DC
  Administered 2012-10-30: 40 mg via SUBCUTANEOUS
  Filled 2012-10-30 (×2): qty 0.4

## 2012-10-30 MED ORDER — ONDANSETRON HCL 4 MG/2ML IJ SOLN: 4.0000 mg | Freq: Four times a day (QID) | INTRAMUSCULAR | Status: DC | PRN

## 2012-10-30 MED ORDER — SODIUM CHLORIDE 0.9 % IV SOLN: INTRAVENOUS | Status: DC

## 2012-10-30 MED ORDER — ONDANSETRON HCL 4 MG PO TABS: 4.0000 mg | ORAL_TABLET | Freq: Four times a day (QID) | ORAL | Status: DC | PRN

## 2012-10-30 MED ORDER — SODIUM CHLORIDE 0.9 % IJ SOLN
3.0000 mL | Freq: Two times a day (BID) | INTRAMUSCULAR | Status: DC
  Administered 2012-10-30 (×2): 3 mL via INTRAVENOUS

## 2012-10-30 MED ORDER — DORZOLAMIDE HCL-TIMOLOL MAL 2-0.5 % OP SOLN
1.0000 [drp] | Freq: Two times a day (BID) | OPHTHALMIC | Status: DC
  Administered 2012-10-30 – 2012-10-31 (×3): 1 [drp] via OPHTHALMIC
  Filled 2012-10-30: qty 10

## 2012-10-30 MED ORDER — ONDANSETRON HCL 4 MG/2ML IJ SOLN: 4.0000 mg | Freq: Three times a day (TID) | INTRAMUSCULAR | Status: DC | PRN

## 2012-10-30 MED ORDER — LATANOPROST 0.005 % OP SOLN
1.0000 [drp] | Freq: Every day | OPHTHALMIC | Status: DC
  Administered 2012-10-30 (×2): 1 [drp] via OPHTHALMIC
  Filled 2012-10-30: qty 2.5

## 2012-10-30 MED ORDER — SODIUM CHLORIDE 0.9 % IV SOLN
INTRAVENOUS | Status: DC
  Administered 2012-10-30: 02:00:00 via INTRAVENOUS

## 2012-10-30 MED ORDER — MOMETASONE FURO-FORMOTEROL FUM 100-5 MCG/ACT IN AERO: 2.0000 | INHALATION_SPRAY | Freq: Two times a day (BID) | RESPIRATORY_TRACT | Status: DC

## 2012-10-30 MED ORDER — LOPERAMIDE HCL 2 MG PO CAPS
2.0000 mg | ORAL_CAPSULE | ORAL | Status: DC | PRN
  Administered 2012-10-30: 2 mg via ORAL
  Filled 2012-10-30: qty 1

## 2012-10-30 MED ORDER — IOHEXOL 300 MG/ML  SOLN
50.0000 mL | Freq: Once | INTRAMUSCULAR | Status: AC | PRN
  Administered 2012-10-30: 50 mL via ORAL

## 2012-10-30 MED ORDER — HYDROMORPHONE HCL PF 1 MG/ML IJ SOLN: 1.0000 mg | INTRAMUSCULAR | Status: AC | PRN

## 2012-10-30 MED ORDER — HYDRALAZINE HCL 20 MG/ML IJ SOLN: 10.0000 mg | INTRAMUSCULAR | Status: DC | PRN

## 2012-10-30 MED ORDER — ACETAMINOPHEN 325 MG PO TABS: 650.0000 mg | ORAL_TABLET | Freq: Four times a day (QID) | ORAL | Status: DC | PRN

## 2012-10-30 MED ORDER — IRBESARTAN 75 MG PO TABS
75.0000 mg | ORAL_TABLET | Freq: Every day | ORAL | Status: DC
  Administered 2012-10-30 – 2012-10-31 (×2): 75 mg via ORAL
  Filled 2012-10-30 (×2): qty 1

## 2012-10-30 MED ORDER — IOHEXOL 300 MG/ML  SOLN
80.0000 mL | Freq: Once | INTRAMUSCULAR | Status: AC | PRN
  Administered 2012-10-30: 80 mL via INTRAVENOUS

## 2012-10-30 MED ORDER — NITROGLYCERIN 0.4 MG SL SUBL: 0.4000 mg | SUBLINGUAL_TABLET | SUBLINGUAL | Status: DC

## 2012-10-30 MED ORDER — PANTOPRAZOLE SODIUM 40 MG PO TBEC
40.0000 mg | DELAYED_RELEASE_TABLET | Freq: Every day | ORAL | Status: DC
  Administered 2012-10-30: 40 mg via ORAL
  Filled 2012-10-30: qty 1

## 2012-10-30 MED ORDER — LEVOTHYROXINE SODIUM 75 MCG PO TABS
75.0000 ug | ORAL_TABLET | Freq: Every day | ORAL | Status: DC
  Administered 2012-10-30 – 2012-10-31 (×2): 75 ug via ORAL
  Filled 2012-10-30 (×3): qty 1

## 2012-10-30 MED ORDER — BENZONATATE 100 MG PO CAPS
100.0000 mg | ORAL_CAPSULE | Freq: Two times a day (BID) | ORAL | Status: DC
  Administered 2012-10-30 – 2012-10-31 (×2): 100 mg via ORAL
  Filled 2012-10-30 (×3): qty 1

## 2012-10-30 MED ORDER — KCL IN DEXTROSE-NACL 30-5-0.45 MEQ/L-%-% IV SOLN
INTRAVENOUS | Status: DC
  Administered 2012-10-30 – 2012-10-31 (×2): via INTRAVENOUS
  Filled 2012-10-30 (×3): qty 1000

## 2012-10-30 MED ORDER — OXYBUTYNIN CHLORIDE ER 10 MG PO TB24
10.0000 mg | ORAL_TABLET | Freq: Every day | ORAL | Status: DC
  Administered 2012-10-30 – 2012-10-31 (×2): 10 mg via ORAL
  Filled 2012-10-30 (×2): qty 1

## 2012-10-30 MED ORDER — MOMETASONE FURO-FORMOTEROL FUM 100-5 MCG/ACT IN AERO
2.0000 | INHALATION_SPRAY | Freq: Two times a day (BID) | RESPIRATORY_TRACT | Status: DC
  Administered 2012-10-30 – 2012-10-31 (×3): 2 via RESPIRATORY_TRACT
  Filled 2012-10-30: qty 8.8

## 2012-10-30 NOTE — H&P (Signed)
[facility] History and Physical  LAVERA VANDERMEER AVW:[id number] DOB: 03-04-1930 DOA: 10/29/2012  Referring physician: Dr.Delo. PCP: [name], MD  Specialists: Dr.Smith cardiologist.                      Dr. Danise Edge gastroenterologist.  Chief Complaint: Nausea vomiting and diarrhea.  HPI: Angela Keith is a 79 y.o. female with history of hypertension and hypothyroidism started experiencing nausea vomiting and diarrhea since last evening. Patient has had multiple episodes of both diarrhea and vomiting. But denies any abdominal pain fever chills. Patient denies any blood in the vomitus or diarrhea. Patient was dismissed on antibiotics for possible bronchitis 3 days ago. In the ER at the [facility] lab work show elevated lipase but patient denies any abdominal pain. At this time patient has been admitted for further management of her diarrhea and vomiting and possible pancreatitis. Patient otherwise denies any chest pain or shortness of breath and has been having productive cough.  Review of Systems: As presented in the history of presenting illness, rest negative.  Past Medical History  Diagnosis Date  . Hypothyroidism   . Arthritis   . GERD (gastroesophageal reflux disease)   . Dysphagia   . Hyperlipidemia   . Hypertension    Past Surgical History  Procedure Laterality Date  . Breast surgery    . Back surgery      lower back   . Esophagogastroduodenoscopy (egd) with esophageal dilation  06/08/2012    Procedure: ESOPHAGOGASTRODUODENOSCOPY (EGD) WITH ESOPHAGEAL DILATION;  Surgeon: Charolett Bumpers, MD;  Location: [facility];  Service: Endoscopy;  Laterality: N/A;   Social History:  reports that she has never smoked. She does not have any smokeless tobacco history on file. She reports that she does not drink alcohol or use illicit drugs. Lives at home. where does patient live-- Can do ADLs. Can patient participate in ADLs?  Allergies  Allergen  Reactions  . Motrin (Ibuprofen) Nausea And Vomiting    Family History  Problem Relation Age of Onset  . CAD Father   . CAD Brother       Prior to Admission medications   Medication Sig Start Date End Date Taking? Authorizing Provider  Fluticasone-Salmeterol (ADVAIR) 100-50 MCG/DOSE AEPB Inhale 1 puff into the lungs every 12 (twelve) hours.   Yes Historical Provider, MD  levofloxacin (LEVAQUIN) 250 MG tablet Take 250 mg by mouth daily.   Yes Historical Provider, MD  aspirin 81 MG tablet Take 81 mg by mouth daily.    Historical Provider, MD  calcium carbonate (OS-CAL) 600 MG TABS Take 600 mg by mouth daily.    Historical Provider, MD  dorzolamide-timolol (COSOPT) 22.3-6.8 MG/ML ophthalmic solution Place 1 drop into both eyes 2 (two) times daily.    Historical Provider, MD  latanoprost (XALATAN) 0.005 % ophthalmic solution Place 1 drop into both eyes at bedtime.    Historical Provider, MD  levothyroxine (SYNTHROID, LEVOTHROID) 75 MCG tablet Take 75 mcg by mouth daily.    Historical Provider, MD  Multiple Vitamins-Minerals (ICAPS PO) Take by mouth daily.    Historical Provider, MD  nitroGLYCERIN (NITROSTAT) 0.4 MG SL tablet Place 0.4 mg under the tongue as directed.    Historical Provider, MD  omeprazole (PRILOSEC) 20 MG capsule Take 20 mg by mouth 2 (two) times daily.    Historical Provider, MD  oxybutynin (DITROPAN-XL) 10 MG 24 hr tablet Take 10 mg by mouth daily.    Historical Provider, MD  valsartan (DIOVAN) 80 MG tablet Take 80 mg by mouth daily.    Historical Provider, MD   Physical Exam: Filed Vitals:   10/29/12 2012 10/30/12 0037  BP: 153/70 180/98  Pulse: 80 76  Temp: 97.7 F (36.5 C) 98.9 F (37.2 C)  TempSrc: Oral Oral  Resp: 14   Height: 5\' 7"  (1.702 m) 5\' 6"  (1.676 m)  Weight: 72.576 kg (160 lb) 70.3 kg (154 lb 15.7 oz)  SpO2: 98% 96%     General:  Well-developed and nourished.  Eyes: Anicteric no pallor.  ENT: No discharge from the ears eyes nose or  mouth.  Neck: No mass felt.  Cardiovascular: S1-S2 heard.  Respiratory: No rhonchi or crepitations.  Abdomen: Soft nontender bowel sounds present no guarding no rigidity.  Skin: No rash.  Musculoskeletal: No edema.  Psychiatric: Appears normal.  Neurologic: Alert awake oriented to time place and person. Moves all extremities.  Labs on Admission:  Basic Metabolic Panel:  Recent Labs Lab 10/29/12 2030  NA 139  K 3.6  CL 103  CO2 25  GLUCOSE 136*  BUN 20  CREATININE 0.90  CALCIUM 9.7   Liver Function Tests:  Recent Labs Lab 10/29/12 2030  AST 24  ALT 21  ALKPHOS 62  BILITOT 0.4  PROT 7.6  ALBUMIN 4.2    Recent Labs Lab 10/29/12 2030  LIPASE 535*   No results found for this basename: AMMONIA,  in the last 168 hours CBC:  Recent Labs Lab 10/29/12 2030  WBC 9.9  NEUTROABS 8.4*  HGB 12.7  HCT 38.0  MCV 87.2  PLT 242   Cardiac Enzymes:  Recent Labs Lab 10/29/12 2030  TROPONINI <0.30    BNP (last 3 results) No results found for this basename: PROBNP,  in the last 8760 hours CBG: No results found for this basename: GLUCAP,  in the last 168 hours  Radiological Exams on Admission: No results found.   Assessment/Plan Principal Problem:   Nausea vomiting and diarrhea Active Problems:   Elevated lipase   HTN (hypertension)   Unspecified hypothyroidism   1. Nausea vomiting and diarrhea with elevated lipase - patient's symptoms are more consistent with gastroenteritis. Check stool for C. difficile and stool cultures. But given significantly elevated lipase we'll check CT abdomen pelvis or any pancreatic abnormalities. Check UA. Patient still feels nauseated and has kept patient n.p.o. for now. If patient's symptoms improved by a.m. then slowly advance diet. 2. Dehydration from #1 - continue fluids. 3. Cough - presently will hold of antibiotics but continue inhalers. 4. Hypertension - continue home medications and patient is also on when  necessary IV hydralazine for systolic blood pressure more than 160. 5. Hypothyroidism - continue home medications.    Code Status: Full code.  Family Communication: Patient's daughter at the bedside.  Disposition Plan: Admit to inpatient.    Murel Shenberger N. [facility] Pager [phone].  If 7PM-7AM, please contact night-coverage [email or web address] Password [email or web address] 10/30/2012, 1:55 AM

## 2012-10-30 NOTE — Progress Notes (Signed)
Brief Nutrition Note:   RD pulled for malnutrition screening tool report of "unsure" of recent unintentional weight loss.   Chart and weight hx reviewed.  Wt Readings from Last 5 Encounters:  10/30/12 154 lb 15.7 oz (70.3 kg)  06/08/12 153 lb (69.4 kg)  06/08/12 153 lb (69.4 kg)  Weight stable.  Diet: NPO Body mass index is 25.03 kg/(m^2). WNL  Chart reviewed, no nutrition interventions warranted at this time. Please consult as needed.    Clarene Duke RD, LDN Pager [phone] After Hours pager [phone]

## 2012-10-30 NOTE — Progress Notes (Signed)
Subjective: Angela Keith is a very pleasant 79 year old female with severe hearing loss and history of hypertension and hypothyroidism.she presented to my office a couple days ago with bronchitis and was started on Levaquin. She developed nausea and vomiting diarrhea and presents with an elevated lipase. CT scan has not shown any acute pancreatitis. She may have had some pancreatic inflammation from the Levaquin. She is feeling better with IV hydration and has not started to eat yet.  No chest pain or shortness of breath. Alert and oriented this morning.  Objective: Weight change:   Intake/Output Summary (Last 24 hours) at 10/30/12 1429 Last data filed at 10/30/12 0859  Gross per 24 hour  Intake    300 ml  Output    750 ml  Net   -450 ml   Filed Vitals:   10/30/12 0425 10/30/12 0508 10/30/12 1225 10/30/12 1410  BP: 129/76 149/66  142/68  Pulse: 69 67  61  Temp: 98.5 F (36.9 C) 98.2 F (36.8 C)  98.8 F (37.1 C)  TempSrc: Oral Oral  Oral  Resp:  16  20  Height:      Weight:      SpO2: 96% 97% 95% 99%    General Appearance: Alert, cooperative, no distress, appears stated age Lungs: Clear to auscultation bilaterally, respirations unlabored Heart: Regular rate and rhythm, S1 and S2 normal, no murmur, rub or gallop Abdomen: Soft, non-tender, bowel sounds active all four quadrants, no masses, no organomegaly Extremities: Extremities normal, atraumatic, no cyanosis or edema Neuro: alert, nonfocal  Lab Results: Results for orders placed during the hospital encounter of 10/29/12 (from the past 48 hour(s))  CBC WITH DIFFERENTIAL     Status: Abnormal   Collection Time    10/29/12  8:30 PM      Result Value Range   WBC 9.9  4.0 - 10.5 K/uL   RBC 4.36  3.87 - 5.11 MIL/uL   Hemoglobin 12.7  12.0 - 15.0 g/dL   HCT 09.8  11.9 - 14.7 %   MCV 87.2  78.0 - 100.0 fL   MCH 29.1  26.0 - 34.0 pg   MCHC 33.4  30.0 - 36.0 g/dL   RDW 82.9  56.2 - 13.0 %   Platelets 242  150 - 400 K/uL   Neutrophils Relative 84 (*) 43 - 77 %   Neutro Abs 8.4 (*) 1.7 - 7.7 K/uL   Lymphocytes Relative 11 (*) 12 - 46 %   Lymphs Abs 1.1  0.7 - 4.0 K/uL   Monocytes Relative 4  3 - 12 %   Monocytes Absolute 0.4  0.1 - 1.0 K/uL   Eosinophils Relative 0  0 - 5 %   Eosinophils Absolute 0.0  0.0 - 0.7 K/uL   Basophils Relative 0  0 - 1 %   Basophils Absolute 0.0  0.0 - 0.1 K/uL  COMPREHENSIVE METABOLIC PANEL     Status: Abnormal   Collection Time    10/29/12  8:30 PM      Result Value Range   Sodium 139  135 - 145 mEq/L   Potassium 3.6  3.5 - 5.1 mEq/L   Chloride 103  96 - 112 mEq/L   CO2 25  19 - 32 mEq/L   Glucose, Bld 136 (*) 70 - 99 mg/dL   BUN 20  6 - 23 mg/dL   Creatinine, Ser 8.65  0.50 - 1.10 mg/dL   Calcium 9.7  8.4 - 78.4 mg/dL   Total Protein  7.6  6.0 - 8.3 g/dL   Albumin 4.2  3.5 - 5.2 g/dL   AST 24  0 - 37 U/L   ALT 21  0 - 35 U/L   Alkaline Phosphatase 62  39 - 117 U/L   Total Bilirubin 0.4  0.3 - 1.2 mg/dL   GFR calc non Af Amer 59 (*) >90 mL/min   GFR calc Af Amer 68 (*) >90 mL/min   Comment:            The eGFR has been calculated     using the CKD EPI equation.     This calculation has not been     validated in all clinical     situations.     eGFR's persistently     <90 mL/min signify     possible Chronic Kidney Disease.  LIPASE, BLOOD     Status: Abnormal   Collection Time    10/29/12  8:30 PM      Result Value Range   Lipase 535 (*) 11 - 59 U/L   Comment: RESULT CONFIRMED BY AUTOMATED DILUTION  TROPONIN I     Status: None   Collection Time    10/29/12  8:30 PM      Result Value Range   Troponin I <0.30  <0.30 ng/mL   Comment:            Due to the release kinetics of cTnI,     a negative result within the first hours     of the onset of symptoms does not rule out     myocardial infarction with certainty.     If myocardial infarction is still suspected,     repeat the test at appropriate intervals.  LIPASE, BLOOD     Status: Abnormal   Collection  Time    10/30/12  2:00 AM      Result Value Range   Lipase 120 (*) 11 - 59 U/L  COMPREHENSIVE METABOLIC PANEL     Status: Abnormal   Collection Time    10/30/12  2:00 AM      Result Value Range   Sodium 142  135 - 145 mEq/L   Potassium 3.3 (*) 3.5 - 5.1 mEq/L   Chloride 105  96 - 112 mEq/L   CO2 26  19 - 32 mEq/L   Glucose, Bld 112 (*) 70 - 99 mg/dL   BUN 17  6 - 23 mg/dL   Creatinine, Ser 3.08  0.50 - 1.10 mg/dL   Calcium 8.9  8.4 - 65.7 mg/dL   Total Protein 6.8  6.0 - 8.3 g/dL   Albumin 3.6  3.5 - 5.2 g/dL   AST 24  0 - 37 U/L   ALT 18  0 - 35 U/L   Alkaline Phosphatase 56  39 - 117 U/L   Total Bilirubin 0.4  0.3 - 1.2 mg/dL   GFR calc non Af Amer 77 (*) >90 mL/min   GFR calc Af Amer 89 (*) >90 mL/min   Comment:            The eGFR has been calculated     using the CKD EPI equation.     This calculation has not been     validated in all clinical     situations.     eGFR's persistently     <90 mL/min signify     possible Chronic Kidney Disease.  CBC WITH DIFFERENTIAL     Status: Abnormal  Collection Time    10/30/12  2:00 AM      Result Value Range   WBC 6.5  4.0 - 10.5 K/uL   RBC 4.04  3.87 - 5.11 MIL/uL   Hemoglobin 11.8 (*) 12.0 - 15.0 g/dL   HCT 16.1 (*) 09.6 - 04.5 %   MCV 84.7  78.0 - 100.0 fL   MCH 29.2  26.0 - 34.0 pg   MCHC 34.5  30.0 - 36.0 g/dL   RDW 40.9  81.1 - 91.4 %   Platelets 224  150 - 400 K/uL   Neutrophils Relative 86 (*) 43 - 77 %   Neutro Abs 5.6  1.7 - 7.7 K/uL   Lymphocytes Relative 9 (*) 12 - 46 %   Lymphs Abs 0.6 (*) 0.7 - 4.0 K/uL   Monocytes Relative 5  3 - 12 %   Monocytes Absolute 0.3  0.1 - 1.0 K/uL   Eosinophils Relative 0  0 - 5 %   Eosinophils Absolute 0.0  0.0 - 0.7 K/uL   Basophils Relative 0  0 - 1 %   Basophils Absolute 0.0  0.0 - 0.1 K/uL  CLOSTRIDIUM DIFFICILE BY PCR     Status: None   Collection Time    10/30/12  2:25 AM      Result Value Range   C difficile by pcr NEGATIVE  NEGATIVE    Studies/Results: Ct  Abdomen Pelvis W Contrast  10/30/2012  *RADIOLOGY REPORT*  Clinical Data: Nausea and vomiting.  Elevated lipase.  CT ABDOMEN AND PELVIS WITH CONTRAST  Technique:  Multidetector CT imaging of the abdomen and pelvis was performed following the standard protocol during bolus administration of intravenous contrast.  Contrast: 50mL OMNIPAQUE IOHEXOL 300 MG/ML  SOLN, 80mL OMNIPAQUE IOHEXOL 300 MG/ML  SOLN  Comparison: None.  Findings: A moderate size hiatal hernia is seen.  The liver, gallbladder, pancreas, spleen, adrenal glands, and kidneys are normal appearance.  There are no radiographic findings of acute pancreatitis.  No pancreatic calcifications are seen.  No soft tissue masses or lymphadenopathy identified within the abdomen or pelvis.  Uterus and adnexae are unremarkable in appearance.  No inflammatory process or abnormal fluid collections are identified.  No evidence of bowel wall thickening or dilatation. Lumbar spondylosis and previous lumbar spine fusion noted.  IMPRESSION:  1.  Moderate hiatal hernia. 2.  No radiographic evidence of acute pancreatitis or other acute findings.   Original Report Authenticated By: Myles Rosenthal, M.D.    Medications: Scheduled Meds: . dorzolamide-timolol  1 drop Both Eyes BID  . enoxaparin (LOVENOX) injection  40 mg Subcutaneous Q24H  . irbesartan  75 mg Oral Daily  . latanoprost  1 drop Both Eyes QHS  . levothyroxine  75 mcg Oral QAC breakfast  . mometasone-formoterol  2 puff Inhalation BID  . nitroGLYCERIN  0.4 mg Sublingual UD  . oxybutynin  10 mg Oral Daily  . pantoprazole  40 mg Oral Daily  . sodium chloride  3 mL Intravenous Q12H   Continuous Infusions: . dexrose 5 % and 0.45 % NaCl with KCl 30 mEq/L 50 mL/hr at 10/30/12 1400   PRN Meds:.acetaminophen, acetaminophen, hydrALAZINE, ondansetron (ZOFRAN) IV, ondansetron  Assessment/Plan: Principal Problem:   Nausea vomiting and diarrhea - symptomatically resolving   Pancreatitis - Will advance diet and  check lipase in a.m. And if improving likely discharge home on Saturday, April 26 Active Problems:   Elevated lipase - repeat in a.m., CT does not show pancreatitis   HTN (  hypertension) - controlled   Unspecified hypothyroidism - check thyroid function   Hypokalemia - replace IV and recheck in a.m.    LOS: 1 day   [name], MD 10/30/2012, 2:29 PM

## 2012-10-30 NOTE — Progress Notes (Signed)
   CARE MANAGEMENT NOTE 10/30/2012  Patient:  Angela Keith, Angela Keith   Account Number:  [id number]  Date Initiated:  10/30/2012  Documentation initiated by:  Letha Cape  Subjective/Objective Assessment:   dx gastroenteritis  admit- lives alone, but sister will be staying with pt after dc.     Action/Plan:   will need pt/ot eval   Anticipated DC Date:  11/01/2012   Anticipated DC Plan:  HOME W HOME HEALTH SERVICES      DC Planning Services  CM consult      Choice offered to / List presented to:             Status of service:  In process, will continue to follow Medicare Important Message given?   (If response is "NO", the following Medicare IM given date fields will be blank) Date Medicare IM given:   Date Additional Medicare IM given:    Discharge Disposition:    Per UR Regulation:  Reviewed for med. necessity/level of care/duration of stay  If discussed at Long Length of Stay Meetings, dates discussed:    Comments:  10/30/12 10:11 Letha Cape RN, BSN [phone] patient lives alone, but sister will be staying with patient at dc.  Willl need a pt/ot eval for patient, sticky note left for MD to order.  NCM will continue to follow for dc needs.

## 2012-10-31 DIAGNOSIS — R05 Cough: Secondary | ICD-10-CM | POA: Clinically undetermined

## 2012-10-31 DIAGNOSIS — R112 Nausea with vomiting, unspecified: Secondary | ICD-10-CM | POA: Diagnosis not present

## 2012-10-31 DIAGNOSIS — I10 Essential (primary) hypertension: Secondary | ICD-10-CM | POA: Diagnosis not present

## 2012-10-31 LAB — CBC WITH DIFFERENTIAL/PLATELET
Basophils Relative: 1 % (ref 0–1)
Eosinophils Absolute: 0.1 10*3/uL (ref 0.0–0.7)
Eosinophils Relative: 2 % (ref 0–5)
Hemoglobin: 11.6 g/dL — ABNORMAL LOW (ref 12.0–15.0)
Lymphs Abs: 1.8 10*3/uL (ref 0.7–4.0)
MCH: 28.8 pg (ref 26.0–34.0)
MCHC: 33.6 g/dL (ref 30.0–36.0)
MCV: 85.6 fL (ref 78.0–100.0)
Monocytes Relative: 15 % — ABNORMAL HIGH (ref 3–12)
RBC: 4.03 MIL/uL (ref 3.87–5.11)

## 2012-10-31 LAB — BASIC METABOLIC PANEL
BUN: 9 mg/dL (ref 6–23)
Calcium: 8.8 mg/dL (ref 8.4–10.5)
Creatinine, Ser: 0.9 mg/dL (ref 0.50–1.10)
GFR calc non Af Amer: 59 mL/min — ABNORMAL LOW (ref >90)
Glucose, Bld: 92 mg/dL (ref 70–99)

## 2012-10-31 MED ORDER — LOPERAMIDE HCL 2 MG PO CAPS: 2.0000 mg | ORAL_CAPSULE | ORAL | Status: DC | PRN

## 2012-10-31 MED ORDER — ACETAMINOPHEN 325 MG PO TABS: 650.0000 mg | ORAL_TABLET | Freq: Four times a day (QID) | ORAL | Status: DC | PRN

## 2012-10-31 MED ORDER — BENZONATATE 100 MG PO CAPS: 100.0000 mg | ORAL_CAPSULE | Freq: Three times a day (TID) | ORAL | Status: DC | PRN

## 2012-10-31 NOTE — Progress Notes (Signed)
Patient prepared for discharge home.  Family with her to hear instructions also.  Patient able to dress self.  Hard of hearing, IV d/c right forearm without problem.   Skin intact, alert, oriented, no questions regarding discharge instructions.  Via w/c wheeled to meet family for d/c.  Bonney Leitz RN

## 2012-10-31 NOTE — Discharge Summary (Signed)
Physician Discharge Summary  NAME:Angela Keith  ZOX:[id number]  DOB: [birth year]   Admit date: 10/29/2012 Discharge date: 10/31/2012  Discharge Diagnoses:  Principal Problem:   Nausea vomiting and diarrhea - likely secondary to intolerance to Levaquin with transient mild pancreatic inflammation, resolved Active Problems:   Elevated lipase - resolved - lipase at discharge is 20, normal   HTN (hypertension) - controlled   Unspecified hypothyroidism - aware   Cough - improved   Borderline hypokalemia - should improve back on valsartan   Discharge Physical Exam:  General Appearance: Alert, cooperative, no distress, appears stated age  Weight change:   Intake/Output Summary (Last 24 hours) at 10/31/12 1048 Last data filed at 10/31/12 8119  Gross per 24 hour  Intake    720 ml  Output   1950 ml  Net  -1230 ml   Filed Vitals:   10/30/12 2119 10/30/12 2132 10/31/12 0615 10/31/12 0844  BP: 132/74  158/84   Pulse: 72  66   Temp: 98.9 F (37.2 C)  98.4 F (36.9 C)   TempSrc: Oral  Oral   Resp: 18  18   Height:      Weight:      SpO2: 96% 98% 95% 96%    Lungs: Clear to auscultation bilaterally, respirations unlabored Heart: Regular rate and rhythm, S1 and S2 normal, no murmur, rub or gallop Abdomen: Soft, non-tender, bowel sounds active all four quadrants, no masses, no organomegaly Extremities: Extremities normal, atraumatic, no cyanosis or edema Neuro: Oriented x3, nonfocal  Discharge Condition: Improved  Hospital Course: 79 year old female with history of hypertension and hypothyroidism and severe hearing loss he developed multiple episodes of diarrhea and vomiting on the night prior to admission.  Her sister has now developed the same symptoms and it is possible that this is a viral gastroenteritis.  She was taking some Levaquin and lipase is elevated at approximately 500 but this has resolved.  Possibly this was a sensitivity to Levaquin but also could be noravirus or  rotavirus.  She has responded well to IV fluids and is tolerating food well.  No further diarrhea.  No nausea or vomiting.  She feels like she is back to normal.  She does have a slightly low potassium at that will followup as an outpatient.  Things to follow up in the outpatient setting: Borderline hypokalemia.  Valsartan HCT will be resumed and will followup potassium in a couple of weeks or sooner if she is not feeling well  Consults: Noncontributory  Disposition: 01-Home or Self Care  Discharge Orders   Future Orders Complete By Expires     Call MD for:  difficulty breathing, headache or visual disturbances  As directed     Call MD for:  persistant nausea and vomiting  As directed     Call MD for:  temperature >100.4  As directed     Diet - low sodium heart healthy  As directed     Discharge instructions  As directed     Comments:      Advance diet as tolerated but no fatty foods or foods containing milk products until diarrhea has resolved for at least 3 days    Increase activity slowly  As directed         Medication List    STOP taking these medications       levofloxacin 250 MG tablet  Commonly known as:  LEVAQUIN      TAKE these medications  acetaminophen 325 MG tablet  Commonly known as:  TYLENOL  Take 2 tablets (650 mg total) by mouth every 6 (six) hours as needed.     aspirin 81 MG tablet  Take 81 mg by mouth daily.     benzonatate 100 MG capsule  Commonly known as:  TESSALON  Take 1 capsule (100 mg total) by mouth 3 (three) times daily as needed for cough (cough).     calcium carbonate 600 MG Tabs  Commonly known as:  OS-CAL  Take 600 mg by mouth daily.     dorzolamide-timolol 22.3-6.8 MG/ML ophthalmic solution  Commonly known as:  COSOPT  Place 1 drop into both eyes 2 (two) times daily.     Fluticasone-Salmeterol 100-50 MCG/DOSE Aepb  Commonly known as:  ADVAIR  Inhale 1 puff into the lungs every 12 (twelve) hours.     ICAPS PO  Take by mouth  daily.     latanoprost 0.005 % ophthalmic solution  Commonly known as:  XALATAN  Place 1 drop into both eyes at bedtime.     levothyroxine 75 MCG tablet  Commonly known as:  SYNTHROID, LEVOTHROID  Take 75 mcg by mouth daily.     loperamide 2 MG capsule  Commonly known as:  IMODIUM  Take 1 capsule (2 mg total) by mouth as needed for diarrhea or loose stools (give after every loose stool up to 5 doses).     nitroGLYCERIN 0.4 MG SL tablet  Commonly known as:  NITROSTAT  Place 0.4 mg under the tongue as directed.     omeprazole 20 MG capsule  Commonly known as:  PRILOSEC  Take 20 mg by mouth 2 (two) times daily.     oxybutynin 10 MG 24 hr tablet  Commonly known as:  DITROPAN-XL  Take 10 mg by mouth daily.     valsartan-hydrochlorothiazide 80-12.5 MG per tablet  Commonly known as:  DIOVAN-HCT  Take 1 tablet by mouth daily.         The results of significant diagnostics from this hospitalization (including imaging, microbiology, ancillary and laboratory) are listed below for reference.    Significant Diagnostic Studies: Ct Abdomen Pelvis W Contrast  10/30/2012  *RADIOLOGY REPORT*  Clinical Data: Nausea and vomiting.  Elevated lipase.  CT ABDOMEN AND PELVIS WITH CONTRAST  Technique:  Multidetector CT imaging of the abdomen and pelvis was performed following the standard protocol during bolus administration of intravenous contrast.  Contrast: 50mL OMNIPAQUE IOHEXOL 300 MG/ML  SOLN, 80mL OMNIPAQUE IOHEXOL 300 MG/ML  SOLN  Comparison: None.  Findings: A moderate size hiatal hernia is seen.  The liver, gallbladder, pancreas, spleen, adrenal glands, and kidneys are normal appearance.  There are no radiographic findings of acute pancreatitis.  No pancreatic calcifications are seen.  No soft tissue masses or lymphadenopathy identified within the abdomen or pelvis.  Uterus and adnexae are unremarkable in appearance.  No inflammatory process or abnormal fluid collections are identified.  No  evidence of bowel wall thickening or dilatation. Lumbar spondylosis and previous lumbar spine fusion noted.  IMPRESSION:  1.  Moderate hiatal hernia. 2.  No radiographic evidence of acute pancreatitis or other acute findings.   Original Report Authenticated By: Myles Rosenthal, M.D.     Microbiology: Recent Results (from the past 240 hour(s))  CLOSTRIDIUM DIFFICILE BY PCR     Status: None   Collection Time    10/30/12  2:25 AM      Result Value Range Status   C difficile by pcr  NEGATIVE  NEGATIVE Final     Labs: Results for orders placed during the hospital encounter of 10/29/12  CLOSTRIDIUM DIFFICILE BY PCR      Result Value Range   C difficile by pcr NEGATIVE  NEGATIVE  CBC WITH DIFFERENTIAL      Result Value Range   WBC 9.9  4.0 - 10.5 K/uL   RBC 4.36  3.87 - 5.11 MIL/uL   Hemoglobin 12.7  12.0 - 15.0 g/dL   HCT 16.1  09.6 - 04.5 %   MCV 87.2  78.0 - 100.0 fL   MCH 29.1  26.0 - 34.0 pg   MCHC 33.4  30.0 - 36.0 g/dL   RDW 40.9  81.1 - 91.4 %   Platelets 242  150 - 400 K/uL   Neutrophils Relative 84 (*) 43 - 77 %   Neutro Abs 8.4 (*) 1.7 - 7.7 K/uL   Lymphocytes Relative 11 (*) 12 - 46 %   Lymphs Abs 1.1  0.7 - 4.0 K/uL   Monocytes Relative 4  3 - 12 %   Monocytes Absolute 0.4  0.1 - 1.0 K/uL   Eosinophils Relative 0  0 - 5 %   Eosinophils Absolute 0.0  0.0 - 0.7 K/uL   Basophils Relative 0  0 - 1 %   Basophils Absolute 0.0  0.0 - 0.1 K/uL  COMPREHENSIVE METABOLIC PANEL      Result Value Range   Sodium 139  135 - 145 mEq/L   Potassium 3.6  3.5 - 5.1 mEq/L   Chloride 103  96 - 112 mEq/L   CO2 25  19 - 32 mEq/L   Glucose, Bld 136 (*) 70 - 99 mg/dL   BUN 20  6 - 23 mg/dL   Creatinine, Ser 7.82  0.50 - 1.10 mg/dL   Calcium 9.7  8.4 - 95.6 mg/dL   Total Protein 7.6  6.0 - 8.3 g/dL   Albumin 4.2  3.5 - 5.2 g/dL   AST 24  0 - 37 U/L   ALT 21  0 - 35 U/L   Alkaline Phosphatase 62  39 - 117 U/L   Total Bilirubin 0.4  0.3 - 1.2 mg/dL   GFR calc non Af Amer 59 (*) >90 mL/min    GFR calc Af Amer 68 (*) >90 mL/min  LIPASE, BLOOD      Result Value Range   Lipase 535 (*) 11 - 59 U/L  TROPONIN I      Result Value Range   Troponin I <0.30  <0.30 ng/mL  LIPASE, BLOOD      Result Value Range   Lipase 120 (*) 11 - 59 U/L  COMPREHENSIVE METABOLIC PANEL      Result Value Range   Sodium 142  135 - 145 mEq/L   Potassium 3.3 (*) 3.5 - 5.1 mEq/L   Chloride 105  96 - 112 mEq/L   CO2 26  19 - 32 mEq/L   Glucose, Bld 112 (*) 70 - 99 mg/dL   BUN 17  6 - 23 mg/dL   Creatinine, Ser 2.13  0.50 - 1.10 mg/dL   Calcium 8.9  8.4 - 08.6 mg/dL   Total Protein 6.8  6.0 - 8.3 g/dL   Albumin 3.6  3.5 - 5.2 g/dL   AST 24  0 - 37 U/L   ALT 18  0 - 35 U/L   Alkaline Phosphatase 56  39 - 117 U/L   Total Bilirubin 0.4  0.3 - 1.2  mg/dL   GFR calc non Af Amer 77 (*) >90 mL/min   GFR calc Af Amer 89 (*) >90 mL/min  CBC WITH DIFFERENTIAL      Result Value Range   WBC 6.5  4.0 - 10.5 K/uL   RBC 4.04  3.87 - 5.11 MIL/uL   Hemoglobin 11.8 (*) 12.0 - 15.0 g/dL   HCT 47.8 (*) 29.5 - 62.1 %   MCV 84.7  78.0 - 100.0 fL   MCH 29.2  26.0 - 34.0 pg   MCHC 34.5  30.0 - 36.0 g/dL   RDW 30.8  65.7 - 84.6 %   Platelets 224  150 - 400 K/uL   Neutrophils Relative 86 (*) 43 - 77 %   Neutro Abs 5.6  1.7 - 7.7 K/uL   Lymphocytes Relative 9 (*) 12 - 46 %   Lymphs Abs 0.6 (*) 0.7 - 4.0 K/uL   Monocytes Relative 5  3 - 12 %   Monocytes Absolute 0.3  0.1 - 1.0 K/uL   Eosinophils Relative 0  0 - 5 %   Eosinophils Absolute 0.0  0.0 - 0.7 K/uL   Basophils Relative 0  0 - 1 %   Basophils Absolute 0.0  0.0 - 0.1 K/uL  CBC WITH DIFFERENTIAL      Result Value Range   WBC 4.5  4.0 - 10.5 K/uL   RBC 4.03  3.87 - 5.11 MIL/uL   Hemoglobin 11.6 (*) 12.0 - 15.0 g/dL   HCT 96.2 (*) 95.2 - 84.1 %   MCV 85.6  78.0 - 100.0 fL   MCH 28.8  26.0 - 34.0 pg   MCHC 33.6  30.0 - 36.0 g/dL   RDW 32.4  40.1 - 02.7 %   Platelets 219  150 - 400 K/uL   Neutrophils Relative 42 (*) 43 - 77 %   Neutro Abs 1.9  1.7 - 7.7  K/uL   Lymphocytes Relative 39  12 - 46 %   Lymphs Abs 1.8  0.7 - 4.0 K/uL   Monocytes Relative 15 (*) 3 - 12 %   Monocytes Absolute 0.7  0.1 - 1.0 K/uL   Eosinophils Relative 2  0 - 5 %   Eosinophils Absolute 0.1  0.0 - 0.7 K/uL   Basophils Relative 1  0 - 1 %   Basophils Absolute 0.1  0.0 - 0.1 K/uL  BASIC METABOLIC PANEL      Result Value Range   Sodium 141  135 - 145 mEq/L   Potassium 3.3 (*) 3.5 - 5.1 mEq/L   Chloride 107  96 - 112 mEq/L   CO2 25  19 - 32 mEq/L   Glucose, Bld 92  70 - 99 mg/dL   BUN 9  6 - 23 mg/dL   Creatinine, Ser 2.53  0.50 - 1.10 mg/dL   Calcium 8.8  8.4 - 66.4 mg/dL   GFR calc non Af Amer 59 (*) >90 mL/min   GFR calc Af Amer 68 (*) >90 mL/min  LIPASE, BLOOD      Result Value Range   Lipase 20  11 - 59 U/L    Time coordinating discharge: 35 minutes  Signed: [name], MD 10/31/2012, 10:48 AM

## 2012-11-02 LAB — STOOL CULTURE

## 2012-12-07 DIAGNOSIS — I10 Essential (primary) hypertension: Secondary | ICD-10-CM | POA: Diagnosis not present

## 2012-12-07 DIAGNOSIS — E782 Mixed hyperlipidemia: Secondary | ICD-10-CM | POA: Diagnosis not present

## 2012-12-07 DIAGNOSIS — E039 Hypothyroidism, unspecified: Secondary | ICD-10-CM | POA: Diagnosis not present

## 2012-12-07 DIAGNOSIS — J209 Acute bronchitis, unspecified: Secondary | ICD-10-CM | POA: Diagnosis not present

## 2013-03-31 DIAGNOSIS — H4011X0 Primary open-angle glaucoma, stage unspecified: Secondary | ICD-10-CM | POA: Diagnosis not present

## 2013-03-31 DIAGNOSIS — H409 Unspecified glaucoma: Secondary | ICD-10-CM | POA: Diagnosis not present

## 2013-04-05 DIAGNOSIS — H409 Unspecified glaucoma: Secondary | ICD-10-CM | POA: Diagnosis not present

## 2013-04-05 DIAGNOSIS — H4011X0 Primary open-angle glaucoma, stage unspecified: Secondary | ICD-10-CM | POA: Diagnosis not present

## 2013-04-05 DIAGNOSIS — H353190 Nonexudative age-related macular degeneration, unspecified eye, stage unspecified: Secondary | ICD-10-CM | POA: Diagnosis not present

## 2013-06-10 DIAGNOSIS — Z0000 Encounter for general adult medical examination without abnormal findings: Secondary | ICD-10-CM | POA: Diagnosis not present

## 2013-06-10 DIAGNOSIS — Z23 Encounter for immunization: Secondary | ICD-10-CM | POA: Diagnosis not present

## 2013-06-10 DIAGNOSIS — K219 Gastro-esophageal reflux disease without esophagitis: Secondary | ICD-10-CM | POA: Diagnosis not present

## 2013-06-10 DIAGNOSIS — E782 Mixed hyperlipidemia: Secondary | ICD-10-CM | POA: Diagnosis not present

## 2013-06-10 DIAGNOSIS — J209 Acute bronchitis, unspecified: Secondary | ICD-10-CM | POA: Diagnosis not present

## 2013-06-10 DIAGNOSIS — Z1331 Encounter for screening for depression: Secondary | ICD-10-CM | POA: Diagnosis not present

## 2013-06-10 DIAGNOSIS — E039 Hypothyroidism, unspecified: Secondary | ICD-10-CM | POA: Diagnosis not present

## 2013-06-10 DIAGNOSIS — M1710 Unilateral primary osteoarthritis, unspecified knee: Secondary | ICD-10-CM | POA: Diagnosis not present

## 2013-06-10 DIAGNOSIS — H9190 Unspecified hearing loss, unspecified ear: Secondary | ICD-10-CM | POA: Diagnosis not present

## 2013-06-10 DIAGNOSIS — I10 Essential (primary) hypertension: Secondary | ICD-10-CM | POA: Diagnosis not present

## 2013-06-10 DIAGNOSIS — Z79899 Other long term (current) drug therapy: Secondary | ICD-10-CM | POA: Diagnosis not present

## 2013-08-02 DIAGNOSIS — E039 Hypothyroidism, unspecified: Secondary | ICD-10-CM | POA: Diagnosis not present

## 2013-09-29 DIAGNOSIS — H4011X0 Primary open-angle glaucoma, stage unspecified: Secondary | ICD-10-CM | POA: Diagnosis not present

## 2013-09-29 DIAGNOSIS — H409 Unspecified glaucoma: Secondary | ICD-10-CM | POA: Diagnosis not present

## 2013-09-29 DIAGNOSIS — H353190 Nonexudative age-related macular degeneration, unspecified eye, stage unspecified: Secondary | ICD-10-CM | POA: Diagnosis not present

## 2014-01-14 DIAGNOSIS — E039 Hypothyroidism, unspecified: Secondary | ICD-10-CM | POA: Diagnosis not present

## 2014-01-14 DIAGNOSIS — B354 Tinea corporis: Secondary | ICD-10-CM | POA: Diagnosis not present

## 2014-01-14 DIAGNOSIS — I4510 Unspecified right bundle-branch block: Secondary | ICD-10-CM | POA: Diagnosis not present

## 2014-01-14 DIAGNOSIS — R55 Syncope and collapse: Secondary | ICD-10-CM | POA: Diagnosis not present

## 2014-03-07 DIAGNOSIS — H409 Unspecified glaucoma: Secondary | ICD-10-CM | POA: Diagnosis not present

## 2014-03-07 DIAGNOSIS — H4011X0 Primary open-angle glaucoma, stage unspecified: Secondary | ICD-10-CM | POA: Diagnosis not present

## 2014-03-07 DIAGNOSIS — H353190 Nonexudative age-related macular degeneration, unspecified eye, stage unspecified: Secondary | ICD-10-CM | POA: Diagnosis not present

## 2014-03-21 ENCOUNTER — Encounter: Payer: Self-pay | Admitting: Interventional Cardiology

## 2014-03-21 ENCOUNTER — Ambulatory Visit (INDEPENDENT_AMBULATORY_CARE_PROVIDER_SITE_OTHER): Payer: Medicare Other | Admitting: Interventional Cardiology

## 2014-03-21 VITALS — BP 128/72 | HR 75 | Ht 66.0 in | Wt 152.0 lb

## 2014-03-21 DIAGNOSIS — I4510 Unspecified right bundle-branch block: Secondary | ICD-10-CM | POA: Diagnosis not present

## 2014-03-21 DIAGNOSIS — R55 Syncope and collapse: Secondary | ICD-10-CM | POA: Diagnosis not present

## 2014-03-21 DIAGNOSIS — I10 Essential (primary) hypertension: Secondary | ICD-10-CM | POA: Diagnosis not present

## 2014-03-21 HISTORY — DX: Unspecified right bundle-branch block: I45.10

## 2014-03-21 NOTE — Patient Instructions (Signed)
Your physician recommends that you continue on your current medications as directed. Please refer to the Current Medication list given to you today.  Your physician has recommended that you wear an event monitor. Event monitors are medical devices that record the heart's electrical activity. Doctors most often [place] these monitors to diagnose arrhythmias. Arrhythmias are problems with the speed or rhythm of the heartbeat. The monitor is a small, portable device. You can wear one while you do your normal daily activities. This is usually used to diagnose what is causing palpitations/syncope (passing out).(To be worn for 7 days)  Your physician recommends that you schedule a follow-up appointment as needed

## 2014-03-21 NOTE — Progress Notes (Signed)
Patient ID: Angela Keith, female   DOB: 1929/11/29, 80 y.o.   MRN: [id number]    [address], [address] [place], [place]  [address] Phone: [phone] Fax:  [phone]  Date:  03/21/2014   ID:  Angela Keith, DOB [birth year], MRN [id number]  PCP:  [name], MD   ASSESSMENT:  1. Palpitations and syncope/near-syncope 2. History of right bundle branch block 3. Hypertension  PLAN:  1. 7 day event monitor 2. No further evaluation if no abnormalities and rhythm are identified.   SUBJECTIVE: Angela Keith is a 80 y.o. female who is having increasingly frequent episodes of unexplained weakness/near syncope. She denies chest pain. Episodes are nonexertional. It totally random and unpredictable. She denies orthopnea, PND, chest pain, edema, and claudication.   Wt Readings from Last 3 Encounters:  03/21/14 152 lb (68.947 kg)  10/30/12 154 lb 15.7 oz (70.3 kg)  06/08/12 153 lb (69.4 kg)     Past Medical History  Diagnosis Date  . Hypothyroidism   . Arthritis   . GERD (gastroesophageal reflux disease)   . Dysphagia   . Hyperlipidemia   . Hypertension   . Right bundle branch block 03/21/2014  . HTN (hypertension) 10/30/2012  . Unspecified hypothyroidism 10/30/2012    Current Outpatient Prescriptions  Medication Sig Dispense Refill  . acetaminophen (TYLENOL) 325 MG tablet Take 2 tablets (650 mg total) by mouth every 6 (six) hours as needed.  30 tablet  0  . aspirin 81 MG tablet Take 81 mg by mouth daily.      . calcium carbonate (OS-CAL) 600 MG TABS Take 600 mg by mouth daily.      . dorzolamide-timolol (COSOPT) 22.3-6.8 MG/ML ophthalmic solution Place 1 drop into both eyes 2 (two) times daily.      . Fluticasone-Salmeterol (ADVAIR) 100-50 MCG/DOSE AEPB Inhale 1 puff into the lungs every 12 (twelve) hours.      Marland Kitchen latanoprost (XALATAN) 0.005 % ophthalmic solution Place 1 drop into both eyes at bedtime.      Marland Kitchen levothyroxine (SYNTHROID, LEVOTHROID) 88 MCG tablet  Take 88 mcg by mouth daily before breakfast.      . loperamide (IMODIUM) 2 MG capsule Take 1 capsule (2 mg total) by mouth as needed for diarrhea or loose stools (give after every loose stool up to 5 doses).  20 capsule  0  . losartan (COZAAR) 50 MG tablet Take 50 mg by mouth daily.      . metoprolol tartrate (LOPRESSOR) 25 MG tablet Take 25 mg by mouth 2 (two) times daily.      . Multiple Vitamins-Minerals (ICAPS PO) Take by mouth daily.      . nitroGLYCERIN (NITROSTAT) 0.4 MG SL tablet Place 0.4 mg under the tongue as directed.      Marland Kitchen omeprazole (PRILOSEC) 20 MG capsule Take 20 mg by mouth 2 (two) times daily.      Marland Kitchen oxybutynin (DITROPAN-XL) 10 MG 24 hr tablet Take 10 mg by mouth daily.       No current facility-administered medications for this visit.    Allergies:    Allergies  Allergen Reactions  . Motrin [Ibuprofen] Nausea And Vomiting    Social History:  The patient  reports that she has never smoked. She does not have any smokeless tobacco history on file. She reports that she does not drink alcohol or use illicit drugs.   ROS:  Please see the history of present illness.   No headaches or transient  neurological complaints.   All other systems reviewed and negative.   OBJECTIVE: VS:  BP 128/72  Pulse 75  Ht  (1.676 m)  Wt 152 lb (68.947 kg)  BMI 24.55 kg/m2 Well nourished, well developed, in no acute distress, elderly HEENT: normal Neck: JVD flat. Carotid bruit absent  Cardiac:  normal S1, S2; RRR; no murmur Lungs:  clear to auscultation bilaterally, no wheezing, rhonchi or rales Abd: soft, nontender, no hepatomegaly Ext: Edema absent. Pulses 2+ and symmetric Skin: warm and dry Neuro:  CNs 2-12 intact, no focal abnormalities noted  EKG:  Right bundle branch block, ectopic atrial rhythm, occasional premature beats.       Signed, Darci Needle III, MD 03/21/2014 1:47 PM

## 2014-03-24 ENCOUNTER — Encounter (INDEPENDENT_AMBULATORY_CARE_PROVIDER_SITE_OTHER): Payer: Medicare Other

## 2014-03-24 ENCOUNTER — Encounter: Payer: Self-pay | Admitting: *Deleted

## 2014-03-24 DIAGNOSIS — R55 Syncope and collapse: Secondary | ICD-10-CM

## 2014-03-24 NOTE — Progress Notes (Signed)
Patient ID: Angela Keith, female   DOB: 02-04-1930, 80 y.o.   MRN: [id number] [employer] verite 7 day cardiac event monitor applied to patient.  Patient is HOH.  Instructions for verite easier to communicate.

## 2014-03-31 DIAGNOSIS — H409 Unspecified glaucoma: Secondary | ICD-10-CM | POA: Diagnosis not present

## 2014-03-31 DIAGNOSIS — H4011X0 Primary open-angle glaucoma, stage unspecified: Secondary | ICD-10-CM | POA: Diagnosis not present

## 2014-04-13 ENCOUNTER — Telehealth: Payer: Self-pay

## 2014-04-13 NOTE — Telephone Encounter (Signed)
returned call. pt daughter in law aware of cardiac monitor results -Normal  -No arrhythmia pt daughter inlaw verbalized understanding.

## 2014-04-13 NOTE — Telephone Encounter (Signed)
Follow up ° ° ° ° ° °Returning Lisa's call °

## 2014-04-13 NOTE — Telephone Encounter (Signed)
called to give pt cardiac monitor results.lmtcb 

## 2014-04-25 DIAGNOSIS — H4011X2 Primary open-angle glaucoma, moderate stage: Secondary | ICD-10-CM | POA: Diagnosis not present

## 2014-04-25 DIAGNOSIS — H3531 Nonexudative age-related macular degeneration: Secondary | ICD-10-CM | POA: Diagnosis not present

## 2014-05-05 DIAGNOSIS — H4011X2 Primary open-angle glaucoma, moderate stage: Secondary | ICD-10-CM | POA: Diagnosis not present

## 2014-05-05 DIAGNOSIS — Z961 Presence of intraocular lens: Secondary | ICD-10-CM | POA: Diagnosis not present

## 2014-05-05 DIAGNOSIS — H3531 Nonexudative age-related macular degeneration: Secondary | ICD-10-CM | POA: Diagnosis not present

## 2014-06-17 DIAGNOSIS — Z23 Encounter for immunization: Secondary | ICD-10-CM | POA: Diagnosis not present

## 2014-06-17 DIAGNOSIS — Z1389 Encounter for screening for other disorder: Secondary | ICD-10-CM | POA: Diagnosis not present

## 2014-06-17 DIAGNOSIS — D649 Anemia, unspecified: Secondary | ICD-10-CM | POA: Diagnosis not present

## 2014-06-17 DIAGNOSIS — E782 Mixed hyperlipidemia: Secondary | ICD-10-CM | POA: Diagnosis not present

## 2014-06-17 DIAGNOSIS — E039 Hypothyroidism, unspecified: Secondary | ICD-10-CM | POA: Diagnosis not present

## 2014-06-17 DIAGNOSIS — J310 Chronic rhinitis: Secondary | ICD-10-CM | POA: Diagnosis not present

## 2014-06-17 DIAGNOSIS — K219 Gastro-esophageal reflux disease without esophagitis: Secondary | ICD-10-CM | POA: Diagnosis not present

## 2014-06-17 DIAGNOSIS — Z0001 Encounter for general adult medical examination with abnormal findings: Secondary | ICD-10-CM | POA: Diagnosis not present

## 2014-06-17 DIAGNOSIS — I10 Essential (primary) hypertension: Secondary | ICD-10-CM | POA: Diagnosis not present

## 2014-09-05 ENCOUNTER — Inpatient Hospital Stay (HOSPITAL_COMMUNITY)
Admission: EM | Admit: 2014-09-05 | Discharge: 2014-09-07 | DRG: 063 | Disposition: A | Discharge status: Home / Self Care | Payer: Medicare Other | Attending: Neurology | Admitting: Neurology

## 2014-09-05 ENCOUNTER — Emergency Department (HOSPITAL_COMMUNITY): Payer: Medicare Other

## 2014-09-05 ENCOUNTER — Encounter (HOSPITAL_COMMUNITY): Payer: Self-pay | Admitting: *Deleted

## 2014-09-05 DIAGNOSIS — E785 Hyperlipidemia, unspecified: Secondary | ICD-10-CM | POA: Diagnosis present

## 2014-09-05 DIAGNOSIS — I6789 Other cerebrovascular disease: Secondary | ICD-10-CM | POA: Diagnosis not present

## 2014-09-05 DIAGNOSIS — E039 Hypothyroidism, unspecified: Secondary | ICD-10-CM | POA: Diagnosis present

## 2014-09-05 DIAGNOSIS — R4781 Slurred speech: Secondary | ICD-10-CM | POA: Diagnosis not present

## 2014-09-05 DIAGNOSIS — I63312 Cerebral infarction due to thrombosis of left middle cerebral artery: Secondary | ICD-10-CM | POA: Diagnosis not present

## 2014-09-05 DIAGNOSIS — Z7982 Long term (current) use of aspirin: Secondary | ICD-10-CM

## 2014-09-05 DIAGNOSIS — R4701 Aphasia: Secondary | ICD-10-CM | POA: Diagnosis present

## 2014-09-05 DIAGNOSIS — I6782 Cerebral ischemia: Secondary | ICD-10-CM | POA: Diagnosis not present

## 2014-09-05 DIAGNOSIS — M47892 Other spondylosis, cervical region: Secondary | ICD-10-CM | POA: Diagnosis present

## 2014-09-05 DIAGNOSIS — H9190 Unspecified hearing loss, unspecified ear: Secondary | ICD-10-CM | POA: Diagnosis present

## 2014-09-05 DIAGNOSIS — I639 Cerebral infarction, unspecified: Secondary | ICD-10-CM | POA: Diagnosis not present

## 2014-09-05 DIAGNOSIS — I10 Essential (primary) hypertension: Secondary | ICD-10-CM | POA: Diagnosis present

## 2014-09-05 DIAGNOSIS — R29810 Facial weakness: Secondary | ICD-10-CM | POA: Diagnosis not present

## 2014-09-05 DIAGNOSIS — K219 Gastro-esophageal reflux disease without esophagitis: Secondary | ICD-10-CM | POA: Diagnosis present

## 2014-09-05 DIAGNOSIS — I6330 Cerebral infarction due to thrombosis of unspecified cerebral artery: Secondary | ICD-10-CM | POA: Insufficient documentation

## 2014-09-05 DIAGNOSIS — I672 Cerebral atherosclerosis: Secondary | ICD-10-CM | POA: Diagnosis not present

## 2014-09-05 DIAGNOSIS — Z79899 Other long term (current) drug therapy: Secondary | ICD-10-CM

## 2014-09-05 DIAGNOSIS — H409 Unspecified glaucoma: Secondary | ICD-10-CM | POA: Diagnosis present

## 2014-09-05 DIAGNOSIS — R531 Weakness: Secondary | ICD-10-CM | POA: Diagnosis not present

## 2014-09-05 DIAGNOSIS — I160 Hypertensive urgency: Secondary | ICD-10-CM | POA: Diagnosis present

## 2014-09-05 DIAGNOSIS — G451 Carotid artery syndrome (hemispheric): Secondary | ICD-10-CM | POA: Diagnosis not present

## 2014-09-05 LAB — RAPID URINE DRUG SCREEN, HOSP PERFORMED
Amphetamines: NOT DETECTED
BARBITURATES: NOT DETECTED
Benzodiazepines: NOT DETECTED
COCAINE: NOT DETECTED
Opiates: NOT DETECTED
Tetrahydrocannabinol: NOT DETECTED

## 2014-09-05 LAB — COMPREHENSIVE METABOLIC PANEL
ALBUMIN: 3.8 g/dL (ref 3.5–5.2)
ALK PHOS: 56 U/L (ref 39–117)
ALT: 15 U/L (ref 0–35)
AST: 22 U/L (ref 0–37)
Anion gap: 12 (ref 5–15)
BUN: 16 mg/dL (ref 6–23)
CO2: 22 mmol/L (ref 19–32)
Calcium: 9.8 mg/dL (ref 8.4–10.5)
Chloride: 103 mmol/L (ref 96–112)
Creatinine, Ser: 0.93 mg/dL (ref 0.50–1.10)
GFR calc Af Amer: 65 mL/min — ABNORMAL LOW (ref >90)
GFR calc non Af Amer: 56 mL/min — ABNORMAL LOW (ref >90)
Glucose, Bld: 113 mg/dL — ABNORMAL HIGH (ref 70–99)
POTASSIUM: 4.1 mmol/L (ref 3.5–5.1)
SODIUM: 137 mmol/L (ref 135–145)
Total Bilirubin: 0.5 mg/dL (ref 0.3–1.2)
Total Protein: 7 g/dL (ref 6.0–8.3)

## 2014-09-05 LAB — URINALYSIS, ROUTINE W REFLEX MICROSCOPIC
BILIRUBIN URINE: NEGATIVE
Glucose, UA: NEGATIVE mg/dL
KETONES UR: NEGATIVE mg/dL
Leukocytes, UA: NEGATIVE
Nitrite: NEGATIVE
PH: 8 (ref 5.0–8.0)
Protein, ur: 30 mg/dL — AB
Specific Gravity, Urine: 1.009 (ref 1.005–1.030)
Urobilinogen, UA: 0.2 mg/dL (ref 0.0–1.0)

## 2014-09-05 LAB — CBC
HCT: 42 % (ref 36.0–46.0)
Hemoglobin: 13.4 g/dL (ref 12.0–15.0)
MCH: 28.6 pg (ref 26.0–34.0)
MCHC: 31.9 g/dL (ref 30.0–36.0)
MCV: 89.6 fL (ref 78.0–100.0)
Platelets: 241 10*3/uL (ref 150–400)
RBC: 4.69 MIL/uL (ref 3.87–5.11)
RDW: 14.8 % (ref 11.5–15.5)
WBC: 7.4 10*3/uL (ref 4.0–10.5)

## 2014-09-05 LAB — I-STAT CHEM 8, ED
BUN: 18 mg/dL (ref 6–23)
CREATININE: 0.9 mg/dL (ref 0.50–1.10)
Calcium, Ion: 1.16 mmol/L (ref 1.13–1.30)
Chloride: 102 mmol/L (ref 96–112)
Glucose, Bld: 112 mg/dL — ABNORMAL HIGH (ref 70–99)
HCT: 45 % (ref 36.0–46.0)
Hemoglobin: 15.3 g/dL — ABNORMAL HIGH (ref 12.0–15.0)
POTASSIUM: 4 mmol/L (ref 3.5–5.1)
SODIUM: 138 mmol/L (ref 135–145)
TCO2: 24 mmol/L (ref 0–100)

## 2014-09-05 LAB — URINE MICROSCOPIC-ADD ON

## 2014-09-05 LAB — CBG MONITORING, ED: Glucose-Capillary: 105 mg/dL — ABNORMAL HIGH (ref 70–99)

## 2014-09-05 LAB — DIFFERENTIAL
BASOS ABS: 0 10*3/uL (ref 0.0–0.1)
BASOS PCT: 0 % (ref 0–1)
EOS ABS: 0.1 10*3/uL (ref 0.0–0.7)
Eosinophils Relative: 1 % (ref 0–5)
Lymphocytes Relative: 31 % (ref 12–46)
Lymphs Abs: 2.3 10*3/uL (ref 0.7–4.0)
MONOS PCT: 6 % (ref 3–12)
Monocytes Absolute: 0.5 10*3/uL (ref 0.1–1.0)
NEUTROS ABS: 4.5 10*3/uL (ref 1.7–7.7)
Neutrophils Relative %: 62 % (ref 43–77)

## 2014-09-05 LAB — PROTIME-INR
INR: 0.96 (ref 0.00–1.49)
Prothrombin Time: 12.9 seconds (ref 11.6–15.2)

## 2014-09-05 LAB — I-STAT TROPONIN, ED: TROPONIN I, POC: 0 ng/mL (ref 0.00–0.08)

## 2014-09-05 LAB — APTT: aPTT: 29 seconds (ref 24–37)

## 2014-09-05 LAB — ETHANOL: Alcohol, Ethyl (B): <5 mg/dL (ref 0–9)

## 2014-09-05 LAB — MRSA PCR SCREENING: MRSA by PCR: NEGATIVE

## 2014-09-05 MED ORDER — PANTOPRAZOLE SODIUM 40 MG IV SOLR
40.0000 mg | Freq: Every day | INTRAVENOUS | Status: DC
  Administered 2014-09-05: 40 mg via INTRAVENOUS
  Filled 2014-09-05 (×2): qty 40

## 2014-09-05 MED ORDER — NICARDIPINE HCL IN NACL 20-0.86 MG/200ML-% IV SOLN
3.0000 mg/h | INTRAVENOUS | Status: DC
  Administered 2014-09-05: 3 mg/h via INTRAVENOUS

## 2014-09-05 MED ORDER — NICARDIPINE HCL IN NACL 20-0.86 MG/200ML-% IV SOLN
INTRAVENOUS | Status: AC
  Filled 2014-09-05: qty 200

## 2014-09-05 MED ORDER — STROKE: EARLY STAGES OF RECOVERY BOOK
Freq: Once | Status: AC
  Administered 2014-09-05: 13:00:00
  Filled 2014-09-05: qty 1

## 2014-09-05 MED ORDER — LABETALOL HCL 5 MG/ML IV SOLN
INTRAVENOUS | Status: AC
  Filled 2014-09-05: qty 4

## 2014-09-05 MED ORDER — ACETAMINOPHEN 650 MG RE SUPP: 650.0000 mg | RECTAL | Status: DC | PRN

## 2014-09-05 MED ORDER — LABETALOL HCL 5 MG/ML IV SOLN
10.0000 mg | Freq: Once | INTRAVENOUS | Status: AC
  Administered 2014-09-05: 10 mg via INTRAVENOUS

## 2014-09-05 MED ORDER — ALTEPLASE (STROKE) FULL DOSE INFUSION
0.9000 mg/kg | Freq: Once | INTRAVENOUS | Status: AC
  Administered 2014-09-05: 71 mg via INTRAVENOUS
  Filled 2014-09-05: qty 71

## 2014-09-05 MED ORDER — SODIUM CHLORIDE 0.9 % IV SOLN
INTRAVENOUS | Status: DC
  Administered 2014-09-05 (×2): via INTRAVENOUS

## 2014-09-05 MED ORDER — LABETALOL HCL 5 MG/ML IV SOLN
10.0000 mg | INTRAVENOUS | Status: DC | PRN
  Administered 2014-09-05 (×2): 10 mg via INTRAVENOUS
  Filled 2014-09-05: qty 4

## 2014-09-05 MED ORDER — ACETAMINOPHEN 325 MG PO TABS: 650.0000 mg | ORAL_TABLET | ORAL | Status: DC | PRN

## 2014-09-05 NOTE — Code Documentation (Addendum)
81yo [facility] at [facility]6.  EMS reports that patient had witnessed right sided facial droop and became nonverbal at 1050.  Her daughter in law was with her at the time.  EMS reports the patient had difficulty ambulating and holding a cup at 0800 this morning as well, however, patient's daughter in law reports that all symptoms had resolved once she had arrived at the patient's house.  EMS assessed patient to begin improving on site and was able to verbalize.  Patient was hypertensive en route at 200/120.  Code Stroke activated.  Stroke team at the bedside on arrival.  Labs drawn and patient cleared by Dr. Jodi MourningZavitz.  Patient to CT with team.  NIHSS 4, see documentation for details and code stroke times.  Patient with mild facial droop on arrival.  Patient hard of hearing, but able to follow commands and respond appropriately.  During exam patient with worsening right sided facial droop and slurred speech. Dr. Roseanne RenoStewart at the bedside.  Order to treat with tPA.  Pharmacy notified to mix tPA at 1212, tPA delivered to the bedside at 1220.  Patient's BP decreased without intervention.  tPA 7mg  IV bolus given at 1222 followed by 64mg /hr.  tPA paused at 1232 d/t elevated BP of 190/92.  Labetalol 10mg  IVP given at 1238 per Dr. Roseanne RenoStewart.   BP decreased to 155/85 and tPA resumed per Dr. Roseanne RenoStewart at [phone].  Patient monitored frequently with vital signs and neuro checks per tPA protocol.  Patient and family updated on the plan of care.  Patient to be admitted to the neuro ICU. Patient transported with ED RN to 9M10.  Patient's BP checked on arrival to 9M10, BP 191/76, tPA paused and Labetalol 10mg  IVP given per orders by 9M RN.  BP decreased to 152/76 and tPA resumed. Bedside handoff with Casilda CarlsKatie and Casey, RNs on [place].

## 2014-09-05 NOTE — Progress Notes (Signed)
Stroke RN to check on patient.  SBP 187 and bedside RN preparing to give Labetalol IVP.  Dr. Roseanne RenoStewart made aware of patient's continued hypertension.  Order for Cardene drip to keep BP in tPA parameters, start at 3mg /hr and titrate as needed per Dr. Roseanne RenoStewart.  Order placed by Stroke RN and bedside RN made aware.

## 2014-09-05 NOTE — ED Provider Notes (Signed)
CSN: [id number]     Arrival date & time 09/05/14  1146 History   First MD Initiated Contact with Patient 09/05/14 1220     Chief Complaint  Patient presents with  . Code Stroke    An emergency department physician performed an initial assessment on this suspected stroke patient at 1149. (Consider location/radiation/quality/duration/timing/severity/associated sxs/prior Treatment) HPI Comments: 81 year old female with history of high blood pressure presents as code stroke for right facial droop and aphasia since 1050 this morning. Patient was at baseline when she woke up. Stroke team in the ER to assist with evaluation on arrival. Symptoms had mild improvement since. Patient denies any active bleeding or significant blood thinners except for aspirin. Nonsmoker. No history of similar  The history is provided by the patient and a relative.    Past Medical History  Diagnosis Date  . Hypothyroidism   . Arthritis   . GERD (gastroesophageal reflux disease)   . Dysphagia   . Hyperlipidemia   . Right bundle branch block 03/21/2014  . HTN (hypertension) 10/30/2012  . Unspecified hypothyroidism 10/30/2012  . Severe hearing loss   . DJD (degenerative joint disease), lumbar   . Rotoscoliosis   . Glaucoma   . DJD (degenerative joint disease), cervical   . Chronic neck pain   . Nocturia   . Insomnia    Past Surgical History  Procedure Laterality Date  . Breast surgery    . Back surgery      lower back   . Esophagogastroduodenoscopy (egd) with esophageal dilation  06/08/2012    Procedure: ESOPHAGOGASTRODUODENOSCOPY (EGD) WITH ESOPHAGEAL DILATION;  Surgeon: Charolett Bumpers, MD;  Location: [facility];  Service: Endoscopy;  Laterality: N/A;  . Bladder laser surgery    . Left carpal tunnel surgery    . Facail surgery  2000    facelift and eyelid lift  . Cataract surgery     Family History  Problem Relation Age of Onset  . CAD Father   . Stroke Father   . CAD Brother   . Diabetes Mother     History  Substance Use Topics  . Smoking status: Never Smoker   . Smokeless tobacco: Not on file  . Alcohol Use: No   OB History    No data available     Review of Systems  Constitutional: Negative for fever and chills.  HENT: Negative for congestion.   Eyes: Negative for visual disturbance.  Respiratory: Negative for shortness of breath.   Cardiovascular: Negative for chest pain.  Gastrointestinal: Negative for vomiting and abdominal pain.  Genitourinary: Negative for dysuria and flank pain.  Musculoskeletal: Negative for back pain, neck pain and neck stiffness.  Skin: Negative for rash.  Neurological: Positive for speech difficulty and weakness. Negative for light-headedness and headaches.      Allergies  Ace inhibitors; Lipitor; and Motrin  Home Medications   Prior to Admission medications   Medication Sig Start Date End Date Taking? Authorizing Provider  aspirin 81 MG tablet Take 81 mg by mouth at bedtime.    Yes Historical Provider, MD  b complex vitamins tablet Take 1 tablet by mouth daily.   Yes Historical Provider, MD  calcium carbonate (OS-CAL) 600 MG TABS Take 600 mg by mouth daily.   Yes Historical Provider, MD  dorzolamide-timolol (COSOPT) 22.3-6.8 MG/ML ophthalmic solution Place 1 drop into both eyes 2 (two) times daily.   Yes Historical Provider, MD  Glucosamine-Chondroitin (MOVE FREE PO) Take 1 tablet by mouth 2 (two) times daily.  Yes Historical Provider, MD  glucosamine-chondroitin 500-400 MG tablet Take 1 tablet by mouth 2 (two) times daily. Move Free brand   Yes Historical Provider, MD  latanoprost (XALATAN) 0.005 % ophthalmic solution Place 1 drop into both eyes at bedtime.   Yes Historical Provider, MD  levothyroxine (SYNTHROID, LEVOTHROID) 88 MCG tablet Take 88 mcg by mouth daily before breakfast.   Yes Historical Provider, MD  losartan (COZAAR) 50 MG tablet Take 50 mg by mouth daily.   Yes Historical Provider, MD  Multiple Vitamins-Minerals (ICAPS  PO) Take by mouth daily.   Yes Historical Provider, MD  naproxen sodium (ANAPROX) 220 MG tablet Take 220 mg by mouth 2 (two) times daily as needed (for pain).    Yes Historical Provider, MD  omeprazole (PRILOSEC) 20 MG capsule Take 20 mg by mouth 2 (two) times daily.   Yes Historical Provider, MD  acetaminophen (TYLENOL) 325 MG tablet Take 2 tablets (650 mg total) by mouth every 6 (six) hours as needed. 10/31/12   Marden Noble, MD  loperamide (IMODIUM) 2 MG capsule Take 1 capsule (2 mg total) by mouth as needed for diarrhea or loose stools (give after every loose stool up to 5 doses). 10/31/12   Marden Noble, MD  nitroGLYCERIN (NITROSTAT) 0.4 MG SL tablet Place 0.4 mg under the tongue as directed.    Historical Provider, MD   BP 127/98 mmHg  Pulse 62  Temp(Src) 98.2 F (36.8 C) (Oral)  Resp 17  Ht  (1.702 m)  Wt 155 lb 3.3 oz (70.4 kg)  BMI 24.30 kg/m2  SpO2 96% Physical Exam  Constitutional: She is oriented to person, place, and time. She appears well-developed and well-nourished.  HENT:  Head: Normocephalic and atraumatic.  Eyes: Conjunctivae are normal. Right eye exhibits no discharge. Left eye exhibits no discharge.  Neck: Normal range of motion. Neck supple. No tracheal deviation present.  Cardiovascular: Normal rate and regular rhythm.   Pulmonary/Chest: Effort normal and breath sounds normal.  Abdominal: Soft. She exhibits no distension. There is no tenderness. There is no guarding.  Musculoskeletal: She exhibits no edema.  Neurological: She is alert and oriented to person, place, and time. Coordination normal. GCS eye subscore is 4. GCS verbal subscore is 5. GCS motor subscore is 6.  Mild right facial droop, mild aphasia. Equal strength bilateral upper and lower extremities.  Skin: Skin is warm. No rash noted.  Psychiatric: She has a normal mood and affect.  Nursing note and vitals reviewed.   ED Course  Procedures (including critical care time) CRITICAL CARE Performed  by: Enid Skeens   Total critical care time: 40 min  Critical care time was exclusive of separately billable procedures and treating other patients.  Critical care was necessary to treat or prevent imminent or life-threatening deterioration.  Critical care was time spent personally by me on the following activities: development of treatment plan with patient and/or surrogate as well as nursing, discussions with consultants, evaluation of patient's response to treatment, examination of patient, obtaining history from patient or surrogate, ordering and performing treatments and interventions, ordering and review of laboratory studies, ordering and review of radiographic studies, pulse oximetry and re-evaluation of patient's condition.  Labs Review Labs Reviewed  COMPREHENSIVE METABOLIC PANEL - Abnormal; Notable for the following:    Glucose, Bld 113 (*)    GFR calc non Af Amer 56 (*)    GFR calc Af Amer 65 (*)    All other components within normal limits  URINALYSIS,  ROUTINE W REFLEX MICROSCOPIC - Abnormal; Notable for the following:    Hgb urine dipstick MODERATE (*)    Protein, ur 30 (*)    All other components within normal limits  I-STAT CHEM 8, ED - Abnormal; Notable for the following:    Glucose, Bld 112 (*)    Hemoglobin 15.3 (*)    All other components within normal limits  CBG MONITORING, ED - Abnormal; Notable for the following:    Glucose-Capillary 105 (*)    All other components within normal limits  MRSA PCR SCREENING  ETHANOL  PROTIME-INR  APTT  CBC  DIFFERENTIAL  URINE RAPID DRUG SCREEN (HOSP PERFORMED)  URINE MICROSCOPIC-ADD ON  I-STAT TROPOININ, ED  I-STAT TROPOININ, ED    Imaging Review Ct Head Wo Contrast  09/05/2014   CLINICAL DATA:  Aphasia.  EXAM: CT HEAD WITHOUT CONTRAST  TECHNIQUE: Contiguous axial images were obtained from the base of the skull through the vertex without intravenous contrast.  COMPARISON:  None.  FINDINGS: Bony calvarium appears  intact. Mild diffuse cortical atrophy is noted. Mild chronic ischemic white matter disease is noted. Old lacunar infarction is noted in left basal ganglia. No mass effect or midline shift is noted. Ventricular size is within normal limits. There is no evidence of mass lesion, hemorrhage or acute infarction.  IMPRESSION: Mild diffuse cortical atrophy. Mild chronic ischemic white matter disease. No acute intracranial abnormality seen. These results were called by telephone at the time of interpretation on 09/05/2014 at 12:07 pm to Dr. Roseanne Reno, who verbally acknowledged these results.   Electronically Signed   By: Lupita Raider, M.D.   On: 09/05/2014 12:08     EKG Interpretation   Date/Time:  Monday September 05 2014 12:06:36 EST Ventricular Rate:  86 PR Interval:  193 QRS Duration: 130 QT Interval:  440 QTC Calculation: 526 R Axis:   29 Text Interpretation:  Sinus rhythm Ventricular premature complex LAE,  consider biatrial enlargement Right bundle branch block Abnormal inferior  Q waves Confirmed by Crystol Walpole  MD, Tyrelle Raczka ([id number]) on 09/05/2014 12:23:29 PM      MDM   Final diagnoses:  CVA (cerebral infarction)   Patient presents as code stroke. Patient within TPA window neurology at bedside assisting evaluation. Neurology discussed risks and benefits of TPA, patient family agreed to proceed. On reassessment mild improvement of symptoms, mild aphasia and mild facial droop persists.  Patient observed closely well TPA administered.  The patients results and plan were reviewed and discussed.   Any x-rays performed were personally reviewed by myself.   Differential diagnosis were considered with the presenting HPI.  Medications  0.9 %  sodium chloride infusion ( Intravenous New Bag/Given 09/05/14 1410)  acetaminophen (TYLENOL) tablet 650 mg (not administered)    Or  acetaminophen (TYLENOL) suppository 650 mg (not administered)  labetalol (NORMODYNE,TRANDATE) injection 10 mg (10 mg  Intravenous Given 09/05/14 1408)  pantoprazole (PROTONIX) injection 40 mg (not administered)  nicardipine (CARDENE)  in 0.86% saline IV infusion (0.1 mg/ml) (0 mg/hr Intravenous Stopped 09/05/14 1500)  alteplase (ACTIVASE) 1 mg/mL infusion 71 mg (71 mg Intravenous Restarted 09/05/14 1241)  labetalol (NORMODYNE,TRANDATE) injection 10 mg (10 mg Intravenous Given 09/05/14 1241)   stroke: mapping our early stages of recovery book ( Does not apply Given 09/05/14 1251)  niCARdipine in saline (CARDENE-IV) 20-0.86 MG/200ML-% infusion SOLN (  Duplicate 09/05/14 1415)    Filed Vitals:   09/05/14 1546 09/05/14 1600 09/05/14 1615 09/05/14 1630  BP:  142/73 155/70 127/98  Pulse:  62 57 62  Temp: 98.2 F (36.8 C)     TempSrc: Oral     Resp:  15 21 17   Height:      Weight:      SpO2:  98% 100% 96%    Final diagnoses:  CVA (cerebral infarction)    Admission/ observation were discussed with the admitting physician, patient and/or family and they are comfortable with the plan.     Enid SkeensJoshua M Staci Dack, MD 09/05/14 [phone]

## 2014-09-05 NOTE — Progress Notes (Signed)
Pt voided 350cc of bloody urine. At this time notified MD and will continue to monitor if continues call back for further orders.

## 2014-09-05 NOTE — ED Notes (Signed)
Pt. Is from home. Complaint of R sided facial droop and aphasia witnessed by family member starting at 1050. Pt. Woke up this AM around 0800 and made breakfast as normal. Pt. States she was having some difficulty with coordination at that time. Pt. Is alert at this time. EMS noted aphasia started to decline and pt. Was more alert in route.

## 2014-09-05 NOTE — H&P (Signed)
Admission H&P    Chief Complaint: Right facial droop with slurred speech.  HPI: Angela Keith is an 81 y.o. female with a history of hypertension, hyperlipidemia, hypothyroidism and degenerative joint disease, brought to the emergency room following onset of facial weakness on the right and slurred speech which occurred at about 10:50 AM today. She also complained of difficulty with using her right hand earlier around 8 AM. However, deficits well cleared prior to onset of facial weakness and slurred speech. She has no previous history of stroke nor TIA. She's been taking aspirin 81 mg per day for antiplatelet therapy. CT scan of her head showed no acute intracranial abnormality. NIH stroke score was 4. Severity of deficits were clearly fluctuating during the emergency room evaluation. IV thrombolytic therapy with TPA was elected. Patient was subsequently admitted to neuro intensive care unit for post TPA management.  LSN: 10:50 AM on 09/05/2014 tPA Given: Yes mRankin:  Past Medical History  Diagnosis Date  . Hypothyroidism   . Arthritis   . GERD (gastroesophageal reflux disease)   . Dysphagia   . Hyperlipidemia   . Right bundle branch block 03/21/2014  . HTN (hypertension) 10/30/2012  . Unspecified hypothyroidism 10/30/2012  . Severe hearing loss   . DJD (degenerative joint disease), lumbar   . Rotoscoliosis   . Glaucoma   . DJD (degenerative joint disease), cervical   . Chronic neck pain   . Nocturia   . Insomnia     Past Surgical History  Procedure Laterality Date  . Breast surgery    . Back surgery      lower back   . Esophagogastroduodenoscopy (egd) with esophageal dilation  06/08/2012    Procedure: ESOPHAGOGASTRODUODENOSCOPY (EGD) WITH ESOPHAGEAL DILATION;  Surgeon: Garlan Fair, MD;  Location: [facility];  Service: Endoscopy;  Laterality: N/A;  . Bladder laser surgery    . Left carpal tunnel surgery    . Facail surgery  2000    facelift and eyelid lift  . Cataract  surgery      Family History  Problem Relation Age of Onset  . CAD Father   . Stroke Father   . CAD Brother   . Diabetes Mother    Social History:  reports that she has never smoked. She does not have any smokeless tobacco history on file. She reports that she does not drink alcohol or use illicit drugs.  Allergies:  Allergies  Allergen Reactions  . Ace Inhibitors     Severe fatigue  . Lipitor [Atorvastatin]     Mood swings  . Motrin [Ibuprofen] Nausea And Vomiting    Medications: Preadmission medications were reviewed by me.  ROS: History obtained from patient's son and daughter-in-law  General ROS: negative for - chills, fatigue, fever, night sweats, weight gain or weight loss Psychological ROS: negative for - behavioral disorder, hallucinations, memory difficulties, mood swings or suicidal ideation Ophthalmic ROS: negative for - blurry vision, double vision, eye pain or loss of vision ENT ROS: negative for - epistaxis, nasal discharge, oral lesions, sore throat, tinnitus or vertigo Allergy and Immunology ROS: negative for - hives or itchy/watery eyes Hematological and Lymphatic ROS: negative for - bleeding problems, bruising or swollen lymph nodes Endocrine ROS: negative for - galactorrhea, hair pattern changes, polydipsia/polyuria or temperature intolerance Respiratory ROS: negative for - cough, hemoptysis, shortness of breath or wheezing Cardiovascular ROS: negative for - chest pain, dyspnea on exertion, edema or irregular heartbeat Gastrointestinal ROS: negative for - abdominal pain, diarrhea, hematemesis, nausea/vomiting  or stool incontinence Genito-Urinary ROS: negative for - dysuria, hematuria, incontinence or urinary frequency/urgency Musculoskeletal ROS: negative for - joint swelling or muscular weakness Neurological ROS: as noted in HPI Dermatological ROS: negative for rash and skin lesion changes   Physical Examination: Blood pressure 185/95, pulse 79,  temperature 98 F (36.7 C), temperature source Oral, resp. rate 17, height '5\' 8"'  (1.727 m), weight 78.472 kg (173 lb), SpO2 96 %.  HEENT-  Normocephalic, no lesions, without obvious abnormality.  Normal external eye and conjunctiva.  Normal TM's bilaterally.  Normal auditory canals and external ears. Normal external nose, mucus membranes and septum.  Normal pharynx. Neck supple with no masses, nodes, nodules or enlargement. Cardiovascular - regular rate and rhythm, S1, S2 normal, no murmur, click, rub or gallop Lungs - chest clear, no wheezing, rales, normal symmetric air entry Abdomen - soft, non-tender; bowel sounds normal; no masses,  no organomegaly Extremities - no joint deformities, effusion, or inflammation and no edema  Neurologic Examination: Mental Status: Alert, oriented, thought content appropriate.  Speech moderately slurred without evidence of aphasia. Able to follow commands without difficulty. Cranial Nerves: II-Visual fields were normal. III/IV/VI-Pupils were equal and reacted. Extraocular movements were full and conjugate.    V/VII-no facial numbness; moderate right lower facial weakness. VIII-marked difficulty with hearing bilaterally. X-moderately slurred speech. XI: trapezius strength/neck flexion strength normal bilaterally XII-midline tongue extension with normal strength. Motor: Mild drift of right lower extremity; otherwise normal motor exam Sensory: Normal throughout. Deep Tendon Reflexes: 2+ and symmetric. Plantars: Flexor bilaterally Cerebellar: Normal finger-to-nose testing. Carotid auscultation: Normal  Results for orders placed or performed during the hospital encounter of 09/05/14 (from the past 48 hour(s))  Protime-INR     Status: None   Collection Time: 09/05/14 11:00 AM  Result Value Ref Range   Prothrombin Time 12.9 11.6 - 15.2 seconds   INR 0.96 0.00 - 1.49  APTT     Status: None   Collection Time: 09/05/14 11:00 AM  Result Value Ref Range    aPTT 29 24 - 37 seconds  CBC     Status: None   Collection Time: 09/05/14 11:00 AM  Result Value Ref Range   WBC 7.4 4.0 - 10.5 K/uL   RBC 4.69 3.87 - 5.11 MIL/uL   Hemoglobin 13.4 12.0 - 15.0 g/dL   HCT 42.0 36.0 - 46.0 %   MCV 89.6 78.0 - 100.0 fL   MCH 28.6 26.0 - 34.0 pg   MCHC 31.9 30.0 - 36.0 g/dL   RDW 14.8 11.5 - 15.5 %   Platelets 241 150 - 400 K/uL  Differential     Status: None   Collection Time: 09/05/14 11:00 AM  Result Value Ref Range   Neutrophils Relative % 62 43 - 77 %   Neutro Abs 4.5 1.7 - 7.7 K/uL   Lymphocytes Relative 31 12 - 46 %   Lymphs Abs 2.3 0.7 - 4.0 K/uL   Monocytes Relative 6 3 - 12 %   Monocytes Absolute 0.5 0.1 - 1.0 K/uL   Eosinophils Relative 1 0 - 5 %   Eosinophils Absolute 0.1 0.0 - 0.7 K/uL   Basophils Relative 0 0 - 1 %   Basophils Absolute 0.0 0.0 - 0.1 K/uL  Comprehensive metabolic panel     Status: Abnormal   Collection Time: 09/05/14 11:00 AM  Result Value Ref Range   Sodium 137 135 - 145 mmol/L   Potassium 4.1 3.5 - 5.1 mmol/L   Chloride 103 96 -  112 mmol/L   CO2 22 19 - 32 mmol/L   Glucose, Bld 113 (H) 70 - 99 mg/dL   BUN 16 6 - 23 mg/dL   Creatinine, Ser 0.93 0.50 - 1.10 mg/dL   Calcium 9.8 8.4 - 10.5 mg/dL   Total Protein 7.0 6.0 - 8.3 g/dL   Albumin 3.8 3.5 - 5.2 g/dL   AST 22 0 - 37 U/L   ALT 15 0 - 35 U/L   Alkaline Phosphatase 56 39 - 117 U/L   Total Bilirubin 0.5 0.3 - 1.2 mg/dL   GFR calc non Af Amer 56 (L) >90 mL/min   GFR calc Af Amer 65 (L) >90 mL/min    Comment: (NOTE) The eGFR has been calculated using the CKD EPI equation. This calculation has not been validated in all clinical situations. eGFR's persistently <90 mL/min signify possible Chronic Kidney Disease.    Anion gap 12 5 - 15  CBG monitoring, ED     Status: Abnormal   Collection Time: 09/05/14 11:51 AM  Result Value Ref Range   Glucose-Capillary 105 (H) 70 - 99 mg/dL  I-Stat Troponin, ED (not at [facility])     Status: None   Collection Time: 09/05/14  11:54 AM  Result Value Ref Range   Troponin i, poc 0.00 0.00 - 0.08 ng/mL   Comment 3            Comment: Due to the release kinetics of cTnI, a negative result within the first hours of the onset of symptoms does not rule out myocardial infarction with certainty. If myocardial infarction is still suspected, repeat the test at appropriate intervals.   I-Stat Chem 8, ED     Status: Abnormal   Collection Time: 09/05/14 11:56 AM  Result Value Ref Range   Sodium 138 135 - 145 mmol/L   Potassium 4.0 3.5 - 5.1 mmol/L   Chloride 102 96 - 112 mmol/L   BUN 18 6 - 23 mg/dL   Creatinine, Ser 0.90 0.50 - 1.10 mg/dL   Glucose, Bld 112 (H) 70 - 99 mg/dL   Calcium, Ion 1.16 1.13 - 1.30 mmol/L   TCO2 24 0 - 100 mmol/L   Hemoglobin 15.3 (H) 12.0 - 15.0 g/dL   HCT 45.0 36.0 - 46.0 %   Ct Head Wo Contrast  09/05/2014   CLINICAL DATA:  Aphasia.  EXAM: CT HEAD WITHOUT CONTRAST  TECHNIQUE: Contiguous axial images were obtained from the base of the skull through the vertex without intravenous contrast.  COMPARISON:  None.  FINDINGS: Bony calvarium appears intact. Mild diffuse cortical atrophy is noted. Mild chronic ischemic white matter disease is noted. Old lacunar infarction is noted in left basal ganglia. No mass effect or midline shift is noted. Ventricular size is within normal limits. There is no evidence of mass lesion, hemorrhage or acute infarction.  IMPRESSION: Mild diffuse cortical atrophy. Mild chronic ischemic white matter disease. No acute intracranial abnormality seen. These results were called by telephone at the time of interpretation on 09/05/2014 at 12:07 pm to Dr. Nicole Kindred, who verbally acknowledged these results.   Electronically Signed   By: Marijo Conception, M.D.   On: 09/05/2014 12:08    Assessment: 81 y.o. female presenting with probable acute left MCA territory ischemic cerebral infarction.  Stroke Risk Factors - hyperlipidemia and hypertension  Plan: 1. Post TPA management and  neuro intensive care unit 2. MRI, MRA  of the brain without contrast 3. PT consult, OT consult, Speech consult 4.  Echocardiogram 5. Carotid dopplers 6. Prophylactic therapy-Antiplatelet med: Aspirin 325 mg per day if CT scan 24 hours post TPA administration shows no intracranial hemorrhage 7. Risk factor modification 8. HgbA1c, fasting lipid panel  This patient is critically ill and at significant risk of neurological worsening, death and care requires constant monitoring of vital signs, hemodynamics,respiratory and cardiac monitoring, neurological assessment, discussion with family, and medical decision making of high complexity.  C.R. Nicole Kindred, MD [facility] [phone] 09/05/2014, 12:47 PM

## 2014-09-06 ENCOUNTER — Inpatient Hospital Stay (HOSPITAL_COMMUNITY): Payer: Medicare Other

## 2014-09-06 DIAGNOSIS — I6789 Other cerebrovascular disease: Secondary | ICD-10-CM | POA: Diagnosis not present

## 2014-09-06 DIAGNOSIS — I6330 Cerebral infarction due to thrombosis of unspecified cerebral artery: Secondary | ICD-10-CM

## 2014-09-06 DIAGNOSIS — I10 Essential (primary) hypertension: Secondary | ICD-10-CM

## 2014-09-06 DIAGNOSIS — E785 Hyperlipidemia, unspecified: Secondary | ICD-10-CM

## 2014-09-06 LAB — LIPID PANEL
Cholesterol: 200 mg/dL (ref 0–200)
HDL: 53 mg/dL (ref >39)
LDL Cholesterol: 137 mg/dL — ABNORMAL HIGH (ref 0–99)
Total CHOL/HDL Ratio: 3.8 RATIO
Triglycerides: 51 mg/dL (ref <150)
VLDL: 10 mg/dL (ref 0–40)

## 2014-09-06 MED ORDER — CLOPIDOGREL BISULFATE 75 MG PO TABS
75.0000 mg | ORAL_TABLET | Freq: Every day | ORAL | Status: DC
  Administered 2014-09-06 – 2014-09-07 (×2): 75 mg via ORAL
  Filled 2014-09-06 (×2): qty 1

## 2014-09-06 MED ORDER — PERFLUTREN LIPID MICROSPHERE
1.0000 mL | INTRAVENOUS | Status: AC | PRN
  Administered 2014-09-06: 2 mL via INTRAVENOUS
  Filled 2014-09-06: qty 10

## 2014-09-06 MED ORDER — ASPIRIN EC 81 MG PO TBEC
81.0000 mg | DELAYED_RELEASE_TABLET | Freq: Every day | ORAL | Status: DC
  Administered 2014-09-07: 81 mg via ORAL
  Filled 2014-09-06: qty 1

## 2014-09-06 MED ORDER — ASPIRIN EC 325 MG PO TBEC
325.0000 mg | DELAYED_RELEASE_TABLET | Freq: Once | ORAL | Status: AC
  Administered 2014-09-06: 325 mg via ORAL
  Filled 2014-09-06: qty 1

## 2014-09-06 MED ORDER — PANTOPRAZOLE SODIUM 40 MG PO TBEC
40.0000 mg | DELAYED_RELEASE_TABLET | Freq: Every day | ORAL | Status: DC
  Administered 2014-09-06: 40 mg via ORAL
  Filled 2014-09-06: qty 1

## 2014-09-06 MED ORDER — DORZOLAMIDE HCL-TIMOLOL MAL 2-0.5 % OP SOLN
1.0000 [drp] | Freq: Two times a day (BID) | OPHTHALMIC | Status: DC
  Administered 2014-09-06 – 2014-09-07 (×2): 1 [drp] via OPHTHALMIC
  Filled 2014-09-06 (×2): qty 10

## 2014-09-06 MED ORDER — LEVOTHYROXINE SODIUM 88 MCG PO TABS
88.0000 ug | ORAL_TABLET | Freq: Every day | ORAL | Status: DC
  Administered 2014-09-07: 88 ug via ORAL
  Filled 2014-09-06 (×2): qty 1

## 2014-09-06 MED ORDER — PRAVASTATIN SODIUM 40 MG PO TABS
40.0000 mg | ORAL_TABLET | Freq: Every day | ORAL | Status: DC
  Administered 2014-09-06: 40 mg via ORAL
  Filled 2014-09-06 (×2): qty 1

## 2014-09-06 MED ORDER — LATANOPROST 0.005 % OP SOLN
1.0000 [drp] | Freq: Every day | OPHTHALMIC | Status: DC
  Administered 2014-09-07: 1 [drp] via OPHTHALMIC
  Filled 2014-09-06 (×2): qty 2.5

## 2014-09-06 NOTE — Progress Notes (Signed)
  Echocardiogram 2D Echocardiogram with Definity has been performed.  Dandrea Medders 09/06/2014, 11:21 AM

## 2014-09-06 NOTE — Evaluation (Signed)
Physical Therapy Evaluation Patient Details Name: Angela Keith MRN: [id number] DOB: Dec 11, 1929 Today's Date: 09/06/2014   History of Present Illness  81 y.o. female with a history of hypertension, hyperlipidemia, hypothyroidism, GERD and degenerative joint disease, brought to the emergency room following onset of facial weakness on the right and slurred speech. NIH stroke score was 4. CT negative for acute abnormalities.  Clinical Impression  Patient demonstrates deficits in functional mobility as indicated below. Will benefit from continued skilled PT to address deficitis and maximize function. Will see as indicated and progress as tolerated. Recommend HHPT and initial supervision upon discharge secondary to insatbility with ambulation.    Follow Up Recommendations Home health PT;Supervision for mobility/OOB    Equipment Recommendations  None recommended by PT    Recommendations for Other Services       Precautions / Restrictions Precautions Precautions: Fall Restrictions Weight Bearing Restrictions: No      Mobility  Bed Mobility               General bed mobility comments: received in chair  Transfers Overall transfer level: Needs assistance Equipment used: None Transfers: Sit to/from Stand Sit to Stand: Min guard         General transfer comment: some posterior lean modest instability noted  Ambulation/Gait Ambulation/Gait assistance: Min assist Ambulation Distance (Feet): 180 Feet Assistive device: 1 person hand held assist Gait Pattern/deviations: Step-through pattern;Drifts right/left;Narrow base of support;Scissoring Gait velocity: decreased Gait velocity interpretation: Below normal speed for age/gender General Gait Details: instbaility noted with gait, several incidence of LOb requiring minimal assist to stabilize, recommend use of RW next session  Stairs            Wheelchair Mobility    Modified Rankin (Stroke Patients Only) Modified  Rankin (Stroke Patients Only) Pre-Morbid Rankin Score: No symptoms Modified Rankin: Moderate disability     Balance Overall balance assessment: Needs assistance Sitting-balance support: Feet supported Sitting balance-Leahy Scale: Good     Standing balance support: During functional activity Standing balance-Leahy Scale: Fair                               Pertinent Vitals/Pain Pain Assessment: No/denies pain    Home Living Family/patient expects to be discharged to:: Private residence Living Arrangements: Alone   Type of Home: House Home Access: Level entry     Home Layout: Able to live on main level with bedroom/bathroom Home Equipment: Walker - 2 wheels;Wheelchair - manual      Prior Function Level of Independence: Independent               Hand Dominance        Extremity/Trunk Assessment               Lower Extremity Assessment: Generalized weakness (some coorindation deficits with ambulation)         Communication   Communication: HOH (wears glasses)  Cognition Arousal/Alertness: Awake/alert Behavior During Therapy: WFL for tasks assessed/performed Overall Cognitive Status: Within Functional Limits for tasks assessed                      General Comments      Exercises        Assessment/Plan    PT Assessment Patient needs continued PT services  PT Diagnosis Difficulty walking;Abnormality of gait;Generalized weakness   PT Problem List Decreased strength;Decreased balance;Decreased mobility;Decreased coordination  PT Treatment Interventions DME instruction;Gait training;Functional  mobility training;Therapeutic activities;Therapeutic exercise;Balance training;Patient/family education   PT Goals (Current goals can be found in the Care Plan section) Acute Rehab PT Goals Patient Stated Goal: to go home PT Goal Formulation: With patient Time For Goal Achievement: 09/20/14 Potential to Achieve Goals: Good     Frequency Min 4X/week   Barriers to discharge        Co-evaluation               End of Session Equipment Utilized During Treatment: Gait belt Activity Tolerance: Patient tolerated treatment well Patient left:  (in w/c for transport to new unit) Nurse Communication: Mobility status         Time: [id number] PT Time Calculation (min) (ACUTE ONLY): 20 min   Charges:   PT Evaluation $Initial PT Evaluation Tier I: 1 Procedure     PT G CodesFabio Asa:        Buckley Bradly J 09/06/2014, 3:12 PM Charlotte Crumbevon Shaelynn Dragos, PT DPT  [phone]

## 2014-09-06 NOTE — Progress Notes (Signed)
STROKE TEAM PROGRESS NOTE   HISTORY OF PRESENT ILLNESS Angela Keith is an 81 y.o. female with a history of hypertension, hyperlipidemia, hypothyroidism and degenerative joint disease, brought to the emergency room following onset of facial weakness on the right and slurred speech which occurred at about 10:50 AM today. She also complained of difficulty with using her right hand earlier around 8 AM. However, deficits well cleared prior to onset of facial weakness and slurred speech. She has no previous history of stroke nor TIA. She's been taking aspirin 81 mg per day for antiplatelet therapy. CT scan of her head showed no acute intracranial abnormality. NIH stroke score was 4. Severity of deficits were clearly fluctuating during the emergency room evaluation. IV thrombolytic therapy with TPA was elected. Patient was subsequently admitted to neuro intensive care unit for post TPA management.  LSN: 10:50 AM on 09/05/2014 tPA Given: Yes  SUBJECTIVE (INTERVAL HISTORY) Her daughter is at the bedside.  Overall she feels her condition is completely resolved. She is talkative and is going to have MRI. No complains.    OBJECTIVE Temp:  [98 F (36.7 C)-98.7 F (37.1 C)] 98.3 F (36.8 C) (03/01 0800) Pulse Rate:  [57-94] 72 (03/01 0700) Cardiac Rhythm:  [-] Normal sinus rhythm (03/01 0400) Resp:  [9-27] 16 (03/01 0700) BP: (106-191)/(48-104) 158/64 mmHg (03/01 0700) SpO2:  [92 %-100 %] 92 % (03/01 0700) Weight:  [155 lb 3.3 oz (70.4 kg)-173 lb (78.472 kg)] 155 lb 3.3 oz (70.4 kg) (02/29 1330)   Recent Labs Lab 09/05/14 1151  GLUCAP 105*    Recent Labs Lab 09/05/14 1100 09/05/14 1156  NA 137 138  K 4.1 4.0  CL 103 102  CO2 22  --   GLUCOSE 113* 112*  BUN 16 18  CREATININE 0.93 0.90  CALCIUM 9.8  --     Recent Labs Lab 09/05/14 1100  AST 22  ALT 15  ALKPHOS 56  BILITOT 0.5  PROT 7.0  ALBUMIN 3.8    Recent Labs Lab 09/05/14 1100 09/05/14 1156  WBC 7.4  --   NEUTROABS  4.5  --   HGB 13.4 15.3*  HCT 42.0 45.0  MCV 89.6  --   PLT 241  --    No results for input(s): CKTOTAL, CKMB, CKMBINDEX, TROPONINI in the last 168 hours.  Recent Labs  09/05/14 1100  LABPROT 12.9  INR 0.96    Recent Labs  09/05/14 1149  COLORURINE YELLOW  LABSPEC 1.009  PHURINE 8.0  GLUCOSEU NEGATIVE  HGBUR MODERATE*  BILIRUBINUR NEGATIVE  KETONESUR NEGATIVE  PROTEINUR 30*  UROBILINOGEN 0.2  NITRITE NEGATIVE  LEUKOCYTESUR NEGATIVE       Component Value Date/Time   CHOL 200 09/06/2014 0224   TRIG 51 09/06/2014 0224   HDL 53 09/06/2014 0224   CHOLHDL 3.8 09/06/2014 0224   VLDL 10 09/06/2014 0224   LDLCALC 137* 09/06/2014 0224   No results found for: HGBA1C    Component Value Date/Time   LABOPIA NONE DETECTED 09/05/2014 1149   COCAINSCRNUR NONE DETECTED 09/05/2014 1149   LABBENZ NONE DETECTED 09/05/2014 1149   AMPHETMU NONE DETECTED 09/05/2014 1149   THCU NONE DETECTED 09/05/2014 1149   LABBARB NONE DETECTED 09/05/2014 1149     Recent Labs Lab 09/05/14 1149  ETH <5    I have personally reviewed the radiological images below and agree with the radiology interpretations.  Ct Head Wo Contrast  09/05/2014    IMPRESSION: Mild diffuse cortical atrophy. Mild chronic ischemic white  matter disease. No acute intracranial abnormality seen.    Carotid Doppler  pending  2D Echocardiogram  Pending  MRI and MRA - pending  EKG  Sinus rhythm, Ventricular premature complex  PHYSICAL EXAM  Temp:  [98 F (36.7 C)-98.7 F (37.1 C)] 98.3 F (36.8 C) (03/01 0800) Pulse Rate:  [57-94] 72 (03/01 0700) Resp:  [9-27] 16 (03/01 0700) BP: (106-191)/(48-104) 158/64 mmHg (03/01 0700) SpO2:  [92 %-100 %] 92 % (03/01 0700) Weight:  [155 lb 3.3 oz (70.4 kg)-173 lb (78.472 kg)] 155 lb 3.3 oz (70.4 kg) (02/29 1330)  General - Well nourished, well developed, in no apparent distress.  Ophthalmologic - fundi not visualized due to cataracts.  Cardiovascular - Regular rate  and rhythm with no murmur.  Neck - supple, no carotid bruits  Mental Status -  Level of arousal and orientation to time, place, and person were intact. Language including expression, naming, repetition, comprehension was assessed and found intact.  Cranial Nerves II - XII - II - Visual field intact OU. III, IV, VI - Extraocular movements intact. V - Facial sensation intact bilaterally. VII - Facial movement intact bilaterally. VIII - Hearing & vestibular intact bilaterally. X - Palate elevates symmetrically. XI - Chin turning & shoulder shrug intact bilaterally. XII - Tongue protrusion intact.  Motor Strength - The patient's strength was normal in all extremities and pronator drift was absent.  Bulk was normal and fasciculations were absent.   Motor Tone - Muscle tone was assessed at the neck and appendages and was normal.  Reflexes - The patient's reflexes were symmetrical in all extremities and she had no pathological reflexes.  Sensory - Light touch, temperature/pinprick were assessed and were symmetrical.    Coordination - The patient had normal movements in the hands and feet with no ataxia or dysmetria.  Tremor was absent.  Gait and Station - not tested due to going for MRI.   ASSESSMENT/PLAN Angela Keith is a 81 y.o. female with history of HTN, HLD and hypothyroidism admitted for right facial droop and slurry speech. S/p tPA. Symptoms completely resolved.    Stroke vs. TIA s/p tPA  MRI  pending  MRA  pending  Carotid Doppler  pending  2D Echo  pending  LDL 137, not at goal  HgbA1c Pending  Within tPA window for VTE prophylaxis  Diet Heart   aspirin 81 mg orally every day prior to admission, now on no antithrombotic due to still within window of tPA  Patient counseled to be compliant with her antithrombotic medications   Ongoing aggressive stroke risk factor management  Therapy recommendations:  pending  Disposition:   pending  Hypertension  Home meds:   losartan Permissive hypertension (OK if <220/120) for 24-48 hours post stroke and then gradually normalized within 5-7 days. Currently on no BP meds  Stable  patient counseled to be compliant with her blood pressure medications  Hyperlipidemia  Home meds:  none   LDL 137, goal < 70  Add pravastatin  daily  Not tolerating lipitor due to mood swing in the past  Continue statin at discharge  Other Stroke Risk Factors  Advanced age  Other Active Problems  hypothyroidism  Other Pertinent History    Hospital day # 1  This patient is critically ill due to stroke s/p tPA and at significant risk of neurological worsening, death form hemorrhagic transformation, recurrent stroke. This patient's care requires constant monitoring of vital signs, hemodynamics, respiratory and cardiac monitoring, review of multiple  databases, neurological assessment, discussion with family, other specialists and medical decision making of high complexity. I spent 35 minutes of neurocritical care time in the care of this patient.   Marvel PlanJindong Kumar Falwell, MD PhD Stroke Neurology 09/06/2014 8:50 AM    To contact Stroke Continuity provider, please refer to [email or web address]. After hours, contact General Neurology

## 2014-09-06 NOTE — Evaluation (Signed)
Occupational Therapy Evaluation Patient Details Name: Angela Keith MRN: [id number] DOB: 08/31/1929 Today's Date: 09/06/2014    History of Present Illness 81 y.o. female with a history of hypertension, hyperlipidemia, hypothyroidism, GERD and degenerative joint disease, brought to the emergency room following onset of facial weakness on the right and slurred speech. NIH stroke score was 4. CT negative for acute abnormalities.   Clinical Impression   Patient independent PTA. Patient currently functioning at an overall supervision level. Patient will benefit from acute OT to increase overall independence in the areas of ADLs, functional mobility, and overall safety in order to safely discharge home with intermittent assist from family.     Follow Up Recommendations  No OT follow up;Supervision - Intermittent    Equipment Recommendations  Tub/shower seat (Pt reports her family can get her a shower seat)    Recommendations for Other Services  None at this time     Precautions / Restrictions Precautions Precautions: Fall Restrictions Weight Bearing Restrictions: No      Mobility Bed Mobility General bed mobility comments: received in chair  Transfers Overall transfer level: Needs assistance Equipment used: None Transfers: Sit to/from Stand Sit to Stand: Supervision General transfer comment: some posterior lean modest instability noted    Balance Overall balance assessment: Needs assistance Sitting-balance support: Feet supported Sitting balance-Leahy Scale: Good     Standing balance support: During functional activity Standing balance-Leahy Scale: Fair     ADL Overall ADL's : Needs assistance/impaired Eating/Feeding: Independent;Sitting   Grooming: Supervision/safety;Standing   Upper Body Bathing: Set up;Sitting   Lower Body Bathing: Supervison/ safety;Sit to/from stand   Upper Body Dressing : Set up;Sitting   Lower Body Dressing: Supervision/safety;Sit  to/from stand   Toilet Transfer: Supervision/safety;Grab bars;Comfort height toilet   Toileting- Clothing Manipulation and Hygiene: Supervision/safety;Sit to/from stand       Functional mobility during ADLs: Supervision/safety;Rolling walker General ADL Comments: Patient able to reach BLEs for LB ADLs. Patient able to ambulate without use of RW safely, but required supervision at this time. Patient will benefit from continued ADL retraining education for overall safety.      Vision Vision Assessment?: No apparent visual deficits   Perception Perception Perception Tested?: No   Praxis Praxis Praxis tested?: Within functional limits    Pertinent Vitals/Pain Pain Assessment: No/denies pain     Hand Dominance Right   Extremity/Trunk Assessment Upper Extremity Assessment Upper Extremity Assessment: Overall WFL for tasks assessed   Lower Extremity Assessment Lower Extremity Assessment: Defer to PT evaluation   Cervical / Trunk Assessment Cervical / Trunk Assessment: Normal   Communication Communication Communication: HOH   Cognition Arousal/Alertness: Awake/alert Behavior During Therapy: WFL for tasks assessed/performed Overall Cognitive Status: Within Functional Limits for tasks assessed              Home Living Family/patient expects to be discharged to:: Private residence Living Arrangements: Alone   Type of Home: House Home Access: Level entry     Home Layout: Able to live on main level with bedroom/bathroom     Bathroom Shower/Tub: Tub/shower unit;Curtain   Bathroom Toilet: Handicapped height     Home Equipment: Environmental consultant - 2 wheels;Wheelchair - manual          Prior Functioning/Environment Level of Independence: Independent      OT Diagnosis: Generalized weakness   OT Problem List: Decreased strength;Decreased activity tolerance;Impaired balance (sitting and/or standing);Decreased safety awareness   OT Treatment/Interventions: Self-care/ADL  training;Energy conservation;DME and/or AE instruction;Therapeutic activities;Patient/family education;Balance training  OT Goals(Current goals can be found in the care plan section) Acute Rehab OT Goals Patient Stated Goal: to go home OT Goal Formulation: With patient Time For Goal Achievement: 09/13/14 Potential to Achieve Goals: Good ADL Goals Pt Will Perform Grooming: with modified independence;standing Pt Will Perform Lower Body Bathing: with modified independence;sit to/from stand Pt Will Perform Lower Body Dressing: with modified independence;sit to/from stand Pt Will Transfer to Toilet: with modified independence;ambulating Pt Will Perform Toileting - Clothing Manipulation and hygiene: with modified independence;sit to/from stand Pt Will Perform Tub/Shower Transfer: Tub transfer;with modified independence;shower seat;ambulating  OT Frequency: Min 2X/week   Barriers to D/C: Decreased caregiver support          End of Session    Activity Tolerance: Patient tolerated treatment well Patient left: in chair;with call bell/phone within reach;with chair alarm set   Time: 1510-1530 OT Time Calculation (min): 20 min Charges:  OT General Charges $OT Visit: 1 Procedure OT Evaluation $Initial OT Evaluation Tier I: 1 Procedure  Ellean Firman , MS, OTR/L, CLT Pager: [phone]  09/06/2014, 3:46 PM

## 2014-09-06 NOTE — Evaluation (Signed)
Speech Language Pathology Evaluation Patient Details Name: Angela Keith MRN: [id number] DOB: 07-22-1929 Today's Date: 09/06/2014 Time: 1017-1030 SLP Time Calculation (min) (ACUTE ONLY): 13 min  Problem List:  Patient Active Problem List   Diagnosis Date Noted  . CVA (cerebral infarction) 09/05/2014  . Hypertensive urgency 09/05/2014  . Right bundle branch block 03/21/2014  . Cough 10/31/2012  . Nausea vomiting and diarrhea 10/30/2012  . Elevated lipase 10/30/2012  . HTN (hypertension) 10/30/2012  . Unspecified hypothyroidism 10/30/2012   Past Medical History:  Past Medical History  Diagnosis Date  . Hypothyroidism   . Arthritis   . GERD (gastroesophageal reflux disease)   . Dysphagia   . Hyperlipidemia   . Right bundle branch block 03/21/2014  . HTN (hypertension) 10/30/2012  . Unspecified hypothyroidism 10/30/2012  . Severe hearing loss   . DJD (degenerative joint disease), lumbar   . Rotoscoliosis   . Glaucoma   . DJD (degenerative joint disease), cervical   . Chronic neck pain   . Nocturia   . Insomnia    Past Surgical History:  Past Surgical History  Procedure Laterality Date  . Breast surgery    . Back surgery      lower back   . Esophagogastroduodenoscopy (egd) with esophageal dilation  06/08/2012    Procedure: ESOPHAGOGASTRODUODENOSCOPY (EGD) WITH ESOPHAGEAL DILATION;  Surgeon: Charolett BumpersMartin K Johnson, MD;  Location: [facility];  Service: Endoscopy;  Laterality: N/A;  . Bladder laser surgery    . Left carpal tunnel surgery    . Facail surgery  2000    facelift and eyelid lift  . Cataract surgery     HPI:  81 y.o. female with a history of hypertension, hyperlipidemia, hypothyroidism, GERD and degenerative joint disease, brought to the emergency room following onset of facial weakness on the right and slurred speech. NIH stroke score was 4. CT negative for acute abnormalities. MRI compleleted but not read.    Assessment / Plan / Recommendation Clinical  Impression  Pt reported difficulty formulating thoughts and delayed reponses during episode yesterday which has resolved. Right labial droop resolved as well. She scored within normal limits on 4 word recall on Cognistat subtest. Awareness, problem solving and attention are functional. Speech is 100% intelligible. No ST needded.     SLP Assessment  Patient does not need any further Speech Lanaguage Pathology Services    Follow Up Recommendations  None    Frequency and Duration        Pertinent Vitals/Pain Pain Assessment: No/denies pain   SLP Goals     SLP Evaluation Prior Functioning  Cognitive/Linguistic Baseline: Within functional limits Vocation: Retired   IT consultantCognition  Overall Cognitive Status: Within Functional Limits for tasks assessed Orientation Level: Oriented X4 Memory:  (scored within normal limits for 4 word recall on Cognistat)    Comprehension  Auditory Comprehension Overall Auditory Comprehension: Appears within functional limits for tasks assessed Visual Recognition/Discrimination Discrimination: Not tested Reading Comprehension Reading Status:  (NT, no difficulties reported)    Expression Expression Primary Mode of Expression: Verbal Verbal Expression Overall Verbal Expression: Appears within functional limits for tasks assessed Pragmatics: No impairment Written Expression Written Expression: Not tested   Oral / Motor Oral Motor/Sensory Function Overall Oral Motor/Sensory Function: Appears within functional limits for tasks assessed (no right labial/facial droop present, has resolved)   GO     Roque CashLitaker, Breck CoonsLisa Willis 09/06/2014, 10:50 AM   Breck CoonsLisa Willis Gunter Conde M.Ed [employer]-SLP Pager [phone]

## 2014-09-06 NOTE — Progress Notes (Signed)
OT Cancellation Note  Patient Details Name: Angela Keith MRN: [id number] DOB: 1930/02/25   Cancelled Treatment:    Reason Eval/Treat Not Completed: Medical issues which prohibited therapy Active bedrest orders. Please update activity orders when appropriate for therapy. Thanks Ayham Word,HILLARY] Luisa DagoHilary Donie Lemelin, OTR/L  [phone] 09/06/2014 09/06/2014, 7:49 AM

## 2014-09-07 DIAGNOSIS — G451 Carotid artery syndrome (hemispheric): Secondary | ICD-10-CM

## 2014-09-07 DIAGNOSIS — E785 Hyperlipidemia, unspecified: Secondary | ICD-10-CM | POA: Diagnosis present

## 2014-09-07 LAB — HEMOGLOBIN A1C
Hgb A1c MFr Bld: 6 % — ABNORMAL HIGH (ref 4.8–5.6)
Mean Plasma Glucose: 126 mg/dL

## 2014-09-07 MED ORDER — PRAVASTATIN SODIUM 40 MG PO TABS: 40.0000 mg | ORAL_TABLET | Freq: Every day | ORAL | Status: DC

## 2014-09-07 MED ORDER — ASPIRIN 81 MG PO TABS
81.0000 mg | ORAL_TABLET | Freq: Every day | ORAL | Status: AC
Start: 2014-09-07 — End: 2014-12-08

## 2014-09-07 MED ORDER — CLOPIDOGREL BISULFATE 75 MG PO TABS: 75.0000 mg | ORAL_TABLET | Freq: Every day | ORAL | Status: DC

## 2014-09-07 NOTE — Progress Notes (Signed)
Talked to patient with daughter present about DCP/ HHC choices, patient refused HHC services stated " my daughter is a physical therapist and she can help me and I do my exercises at home.... I  know what to do." CM informed patient that when she goes home and wants physical therapy after discharge to call her PCP and he will make the arrangements for home health care; B Ave Filterhandler RN,BSN,MHA

## 2014-09-07 NOTE — Progress Notes (Signed)
Physical Therapy Treatment Patient Details Name: DANYELL SHADER MRN: [id number] DOB: [birth year] Today's Date: 09/07/2014    History of Present Illness 81 y.o. female with a history of hypertension, hyperlipidemia, hypothyroidism, GERD and degenerative joint disease, brought to the emergency room following onset of facial weakness on the right and slurred speech. NIH stroke score was 4. CT negative for acute abnormalities.    PT Comments    Patient progressing well. Able to walk safely with use of RW. Patient has used RW before and stated that she did not mind having to use it again. Patient is hoping to discharge today, she is waiting to speak to MD. Patient does have a step to enter the house and wanted to practice so we complete stair training and safety. Patient safe to D/C from a mobility standpoint based on progression towards goals set on PT eval.    Follow Up Recommendations  Home health PT;Supervision for mobility/OOB     Equipment Recommendations  None recommended by PT    Recommendations for Other Services       Precautions / Restrictions Precautions Precautions: Fall Restrictions Weight Bearing Restrictions: No    Mobility  Bed Mobility Overal bed mobility: Independent                Transfers Overall transfer level: Modified independent                  Ambulation/Gait Ambulation/Gait assistance: Supervision Ambulation Distance (Feet): 600 Feet Assistive device: Rolling walker (2 wheeled) Gait Pattern/deviations: Step-through pattern;Decreased stride length   Gait velocity interpretation: at or above normal speed for age/gender General Gait Details: utilized RW this session and patient did well. Minor cues for safety with RW but otherwise did very well.    Stairs Stairs: Yes Stairs assistance: Supervision Stair Management: Step to pattern;One rail Left Number of Stairs: 5 General stair comments: Patient demos safe stair  training  Wheelchair Mobility    Modified Rankin (Stroke Patients Only) Modified Rankin (Stroke Patients Only) Pre-Morbid Rankin Score: No symptoms Modified Rankin: Moderate disability     Balance                                    Cognition Arousal/Alertness: Awake/alert Behavior During Therapy: WFL for tasks assessed/performed Overall Cognitive Status: Within Functional Limits for tasks assessed                      Exercises      General Comments        Pertinent Vitals/Pain Pain Assessment: No/denies pain    Home Living                      Prior Function            PT Goals (current goals can now be found in the care plan section) Progress towards PT goals: Progressing toward goals    Frequency  Min 4X/week    PT Plan Current plan remains appropriate    Co-evaluation             End of Session Equipment Utilized During Treatment: Gait belt Activity Tolerance: Patient tolerated treatment well Patient left: in chair;with call bell/phone within reach;with chair alarm set;with family/visitor present     Time: 5784-6962 PT Time Calculation (min) (ACUTE ONLY): 13 min  Charges:  $Gait Training: 8-22 mins  G Codes:      Fredrich BirksRobinette, Julia Elizabeth 09/07/2014, 11:02 AM

## 2014-09-07 NOTE — Progress Notes (Signed)
Occupational Therapy Treatment Patient Details Name: Angela PavlovRachel E Keith MRN: [id number] DOB: Jan 23, 1930 Today's Date: 09/07/2014    History of present illness 81 y.o. female with a history of hypertension, hyperlipidemia, hypothyroidism, GERD and degenerative joint disease, brought to the emergency room following onset of facial weakness on the right and slurred speech. NIH stroke score was 4. CT negative for acute abnormalities.   OT comments  Patient progressing towards goals, continue plan of care for now. Patient's daughter in law present during session and states that patient will have intermittent supervision at home. Patient overall supervision>mod I using RW during OT session.   Follow Up Recommendations  No OT follow up;Supervision - Intermittent    Equipment Recommendations  None recommended by OT    Recommendations for Other Services  None at this time    Precautions / Restrictions Precautions Precautions: Fall Restrictions Weight Bearing Restrictions: No       Mobility Bed Mobility Overal bed mobility: Independent  Transfers Overall transfer level: Modified independent   Balance Overall balance assessment: Needs assistance Sitting-balance support: Feet supported;No upper extremity supported Sitting balance-Leahy Scale: Good     Standing balance support: Bilateral upper extremity supported;During functional activity Standing balance-Leahy Scale: Good    ADL   Tub/ Shower Transfer: Tub transfer;Ambulation;Rolling walker;Tub bench     General ADL Comments: Patient overall supervision->mod I for all mobility and transfers using RW at this time. Patient's daughter in law present and states they will be able to provide supervision prn. Daughter in law stated pt is refusing HHPT, notified pts PT of this.      Cognition   Behavior During Therapy: WFL for tasks assessed/performed Overall Cognitive Status: Within Functional Limits for tasks assessed                 Pertinent Vitals/ Pain       Pain Assessment: No/denies pain         Frequency Min 2X/week     Progress Toward Goals  OT Goals(current goals can now be found in the care plan section)  Progress towards OT goals: Progressing toward goals     Plan Discharge plan remains appropriate       End of Session Equipment Utilized During Treatment: Rolling walker   Activity Tolerance Patient tolerated treatment well   Patient Left in chair;with call bell/phone within reach;with family/visitor present     Time: 1331-1350 OT Time Calculation (min): 19 min  Charges: OT General Charges $OT Visit: 1 Procedure OT Treatments $Self Care/Home Management : 8-22 mins  Namine Beahm , MS, OTR/L, CLT Pager: [phone]  09/07/2014, 2:04 PM

## 2014-09-07 NOTE — Progress Notes (Signed)
Patient DC'd home via car with family.  DC instructions and information regarding prescription were given to patient and fully understood.  Patient refused EMMI.  Vital signs and assessments were stable.

## 2014-09-07 NOTE — Discharge Summary (Addendum)
Stroke Discharge Summary  Patient ID: Angela Keith   MRN: [id number]      DOB: 01-25-1930  Date of Admission: 09/05/2014 Date of Discharge: 09/07/2014  Attending Physician:  Marvel Plan, MD, Stroke MD  Consulting Physician(s):      one Patient's PCP:  [name], MD  DISCHARGE DIAGNOSIS:  Principal Problem:   Presumed CVA (cerebral infarction) s/p IV tPA, MRI negative Active Problems:   HTN (hypertension)   Hypertensive urgency   Hyperlipidemia LDL goal <70  BMI: Body mass index is 24.3 kg/(m^2).  Past Medical History  Diagnosis Date  . Hypothyroidism   . Arthritis   . GERD (gastroesophageal reflux disease)   . Dysphagia   . Hyperlipidemia   . Right bundle branch block 03/21/2014  . HTN (hypertension) 10/30/2012  . Unspecified hypothyroidism 10/30/2012  . Severe hearing loss   . DJD (degenerative joint disease), lumbar   . Rotoscoliosis   . Glaucoma   . DJD (degenerative joint disease), cervical   . Chronic neck pain   . Nocturia   . Insomnia    Past Surgical History  Procedure Laterality Date  . Breast surgery    . Back surgery      lower back   . Esophagogastroduodenoscopy (egd) with esophageal dilation  06/08/2012    Procedure: ESOPHAGOGASTRODUODENOSCOPY (EGD) WITH ESOPHAGEAL DILATION;  Surgeon: Charolett Bumpers, MD;  Location: [facility];  Service: Endoscopy;  Laterality: N/A;  . Bladder laser surgery    . Left carpal tunnel surgery    . Facail surgery  2000    facelift and eyelid lift  . Cataract surgery        Medication List    TAKE these medications        acetaminophen 325 MG tablet  Commonly known as:  TYLENOL  Take 2 tablets (650 mg total) by mouth every 6 (six) hours as needed.     aspirin 81 MG tablet  Take 1 tablet (81 mg total) by mouth at bedtime.     b complex vitamins tablet  Take 1 tablet by mouth daily.     calcium carbonate 600 MG Tabs tablet  Commonly known as:  OS-CAL  Take 600 mg by mouth daily.     clopidogrel  75 MG tablet  Commonly known as:  PLAVIX  Take 1 tablet (75 mg total) by mouth daily.     dorzolamide-timolol 22.3-6.8 MG/ML ophthalmic solution  Commonly known as:  COSOPT  Place 1 drop into both eyes 2 (two) times daily.     ICAPS PO  Take by mouth daily.     latanoprost 0.005 % ophthalmic solution  Commonly known as:  XALATAN  Place 1 drop into both eyes at bedtime.     levothyroxine 88 MCG tablet  Commonly known as:  SYNTHROID, LEVOTHROID  Take 88 mcg by mouth daily before breakfast.     loperamide 2 MG capsule  Commonly known as:  IMODIUM  Take 1 capsule (2 mg total) by mouth as needed for diarrhea or loose stools (give after every loose stool up to 5 doses).     losartan 50 MG tablet  Commonly known as:  COZAAR  Take 50 mg by mouth daily.     MOVE FREE PO  Take 1 tablet by mouth 2 (two) times daily.     naproxen sodium 220 MG tablet  Commonly known as:  ANAPROX  Take 220 mg by mouth 2 (two) times daily as needed (  for pain).     nitroGLYCERIN 0.4 MG SL tablet  Commonly known as:  NITROSTAT  Place 0.4 mg under the tongue as directed.     omeprazole 20 MG capsule  Commonly known as:  PRILOSEC  Take 20 mg by mouth 2 (two) times daily.     pravastatin 40 MG tablet  Commonly known as:  PRAVACHOL  Take 1 tablet (40 mg total) by mouth daily at 6 PM.        LABORATORY STUDIES CBC    Component Value Date/Time   WBC 7.4 09/05/2014 1100   RBC 4.69 09/05/2014 1100   HGB 15.3* 09/05/2014 1156   HCT 45.0 09/05/2014 1156   PLT 241 09/05/2014 1100   MCV 89.6 09/05/2014 1100   MCH 28.6 09/05/2014 1100   MCHC 31.9 09/05/2014 1100   RDW 14.8 09/05/2014 1100   LYMPHSABS 2.3 09/05/2014 1100   MONOABS 0.5 09/05/2014 1100   EOSABS 0.1 09/05/2014 1100   BASOSABS 0.0 09/05/2014 1100   CMP    Component Value Date/Time   NA 138 09/05/2014 1156   K 4.0 09/05/2014 1156   CL 102 09/05/2014 1156   CO2 22 09/05/2014 1100   GLUCOSE 112* 09/05/2014 1156   BUN 18  09/05/2014 1156   CREATININE 0.90 09/05/2014 1156   CALCIUM 9.8 09/05/2014 1100   PROT 7.0 09/05/2014 1100   ALBUMIN 3.8 09/05/2014 1100   AST 22 09/05/2014 1100   ALT 15 09/05/2014 1100   ALKPHOS 56 09/05/2014 1100   BILITOT 0.5 09/05/2014 1100   GFRNONAA 56* 09/05/2014 1100   GFRAA 65* 09/05/2014 1100   COAGS Lab Results  Component Value Date   INR 0.96 09/05/2014   Lipid Panel    Component Value Date/Time   CHOL 200 09/06/2014 0224   TRIG 51 09/06/2014 0224   HDL 53 09/06/2014 0224   CHOLHDL 3.8 09/06/2014 0224   VLDL 10 09/06/2014 0224   LDLCALC 137* 09/06/2014 0224   HgbA1C  Lab Results  Component Value Date   HGBA1C 6.0* 09/06/2014   Cardiac Panel (last 3 results) No results for input(s): CKTOTAL, CKMB, TROPONINI, RELINDX in the last 72 hours. Urinalysis    Component Value Date/Time   COLORURINE YELLOW 09/05/2014 1149   APPEARANCEUR CLEAR 09/05/2014 1149   LABSPEC 1.009 09/05/2014 1149   PHURINE 8.0 09/05/2014 1149   GLUCOSEU NEGATIVE 09/05/2014 1149   HGBUR MODERATE* 09/05/2014 1149   BILIRUBINUR NEGATIVE 09/05/2014 1149   KETONESUR NEGATIVE 09/05/2014 1149   PROTEINUR 30* 09/05/2014 1149   UROBILINOGEN 0.2 09/05/2014 1149   NITRITE NEGATIVE 09/05/2014 1149   LEUKOCYTESUR NEGATIVE 09/05/2014 1149   Urine Drug Screen     Component Value Date/Time   LABOPIA NONE DETECTED 09/05/2014 1149   COCAINSCRNUR NONE DETECTED 09/05/2014 1149   LABBENZ NONE DETECTED 09/05/2014 1149   AMPHETMU NONE DETECTED 09/05/2014 1149   THCU NONE DETECTED 09/05/2014 1149   LABBARB NONE DETECTED 09/05/2014 1149    Alcohol Level    Component Value Date/Time   ETH <5 09/05/2014 1149     SIGNIFICANT DIAGNOSTIC STUDIES  Ct Head Wo Contrast 09/05/2014    Mild diffuse cortical atrophy. Mild chronic ischemic white matter disease. No acute intracranial abnormality seen.   Mr Brain Wo Contrast 09/06/2014    Negative for acute infarct or hemorrhage.  Atrophy and mild chronic  microvascular ischemia  Intracranial atherosclerotic disease as above.  Mr Maxine GlennMra Head/brain Wo Cm 09/06/2014    No large vessel occlusion.   2D  Echocardiogram  LVEF 65-70%, mild LVH, normal wall motion, diastolic dysfunction, indeterminate LV filling pressure, no significant valvulardisease. No source of embolus.   Carotid Doppler  Mild heterogeneous plaque carotid bifurcations bilaterally. 1-39% carotid stenosis bilaterally.      HISTORY OF PRESENT ILLNESS  Angela Keith is an 81 y.o. female with a history of hypertension, hyperlipidemia, hypothyroidism and degenerative joint disease, brought to the emergency room following onset of facial weakness on the right and slurred speech which occurred at about 10:50 AM today 09/05/2014. She also complained of difficulty with using her right hand earlier around 8 AM. However, deficits well cleared prior to onset of facial weakness and slurred speech. She has no previous history of stroke nor TIA. She's been taking aspirin 81 mg per day for antiplatelet therapy. CT scan of her head showed no acute intracranial abnormality. NIH stroke score was 4. Severity of deficits were clearly fluctuating during the emergency room evaluation. IV thrombolytic therapy with TPA was elected. Patient was subsequently admitted to neuro intensive care unit for post TPA management.   HOSPITAL COURSE Symptoms resolved by the next day. MRI negative and stroke work up showed intracranial athero. Put on dural antiplatelet. High LDL and put on pravastatin due to lipito/crestor intolerance. Other stroke work up unremarkable. She was discharged in good condition. PT recommend home PT.   Presumed stroke with right facial weakness and dysarthria resolved s/p tPA  Patient tolerated tPA without complication. Imaging at 24 hours shows no hemorrhage.   MRI negative for ischemic infarct   MRA without large vessel stenosis, intracranial atherosclerosis present  Carotid Doppler No  significant stenosis   2D Echo No source of embolus   HgbA1c 6.0, WNL  She was on  aspirin 81 mg orally every day prior to admission. Given intracranial atherosclerosis, she was started on aspirin 81 mg orally every day and clopidogrel 75 mg orally every day for secondary stroke prevention. Patient should be treated with aspirin 81 mg and clopidogrel 75 mg orally every day x 3 months for secondary stroke prevention. After 3 months, change to plavix alone. Long-term dual antiplatelets are contraindicated due to risk for intracerebral hemorrhage.   Patient counseled to be compliant with her antithrombotic medications   Ongoing aggressive stroke risk factor management recommended  Therapy recommendations: no OT needs. HH PT recommended and ordered  Disposition: d/c home  Hypertension  Home meds: losartan  labetalol 10 mg IV x 2 given to lower BP within acceptable range for IV tPA administration  BP stabilized after that  Resume home meds at time of discharge  Hyperlipidemia  Home meds: none   LDL 137, goal < 70  Added pravastatin  daily as not tolerating lipitor in the past due to mood swing   Continue statin at discharge  Other Stroke Risk Factors  Advanced age  Other Active Problems  hypothyroidism   DISCHARGE EXAM Blood pressure 114/93, pulse 67, temperature 97.6 F (36.4 C), temperature source Oral, resp. rate 18, height  (1.702 m), weight 70.4 kg (155 lb 3.3 oz), SpO2 96 %. General - Well nourished, well developed, in no apparent distress.  Ophthalmologic - fundi not visualized due to cataracts.  Cardiovascular - Regular rate and rhythm with no murmur.  Neck - supple, no carotid bruits  Mental Status -  Level of arousal and orientation to time, place, and person were intact. Language including expression, naming, repetition, comprehension was assessed and found intact.  Cranial Nerves II - XII - II -  Visual field intact OU. III, IV, VI  - Extraocular movements intact. V - Facial sensation intact bilaterally. VII - Facial movement intact bilaterally. VIII - Hearing & vestibular intact bilaterally. X - Palate elevates symmetrically. XI - Chin turning & shoulder shrug intact bilaterally. XII - Tongue protrusion intact.  Motor Strength - The patient's strength was normal in all extremities and pronator drift was absent. Bulk was normal and fasciculations were absent.  Motor Tone - Muscle tone was assessed at the neck and appendages and was normal.  Reflexes - The patient's reflexes were symmetrical in all extremities and she had no pathological reflexes.  Sensory - Light touch, temperature/pinprick were assessed and were symmetrical.   Coordination - The patient had normal movements in the hands and feet with no ataxia or dysmetria. Tremor was absent.  Gait and Station - not tested due to going for MRI.  Discharge Diet   Diet Heart thin liquids  DISCHARGE PLAN  Disposition:  Return home  [employer] PT   aspirin 81 mg orally every day and clopidogrel 75 mg orally every day for secondary stroke prevention x 3 months then plavix alone  Follow-up GATES,[name], MD in 2 weeks.  Follow-up with Dr. Marvel Plan, Stroke Clinic in 2 months.  45 minutes were spent preparing discharge.  Rhoderick Moody [facility] See Amion for Pager information 09/07/2014 3:10 PM    I, the attending vascular neurologist, have personally obtained a history, examined the patient, evaluated laboratory data, individually viewed imaging studies and agree with radiology interpretations. I also discussed with pt's daughter at bedside. Together with the NP/PA, we formulated the assessment and plan of care which reflects our mutual decision.  I have made any additions or clarifications directly to the above note and agree with the findings and plan as currently documented.    Marvel Plan, MD PhD Stroke Neurology 09/07/2014 3:54  PM

## 2014-09-07 NOTE — Progress Notes (Signed)
VASCULAR LAB PRELIMINARY  PRELIMINARY  PRELIMINARY  PRELIMINARY  Carotid Duplex completed.    Preliminary report:  Mild heterogeneous plaque carotid bifurcations bilaterally.   1-39% carotid stenosis bilaterally.   Tyrone AppleKB  Neldon Shepard F, RVT 09/07/2014, 1:36 PM

## 2014-09-09 DIAGNOSIS — I10 Essential (primary) hypertension: Secondary | ICD-10-CM | POA: Diagnosis not present

## 2014-09-09 DIAGNOSIS — I639 Cerebral infarction, unspecified: Secondary | ICD-10-CM | POA: Diagnosis not present

## 2014-09-13 DIAGNOSIS — I10 Essential (primary) hypertension: Secondary | ICD-10-CM | POA: Diagnosis not present

## 2014-09-13 DIAGNOSIS — I639 Cerebral infarction, unspecified: Secondary | ICD-10-CM | POA: Diagnosis not present

## 2014-09-13 DIAGNOSIS — E782 Mixed hyperlipidemia: Secondary | ICD-10-CM | POA: Diagnosis not present

## 2014-09-23 DIAGNOSIS — I6330 Cerebral infarction due to thrombosis of unspecified cerebral artery: Secondary | ICD-10-CM | POA: Insufficient documentation

## 2014-09-26 ENCOUNTER — Telehealth: Payer: Self-pay | Admitting: *Deleted

## 2014-09-26 ENCOUNTER — Encounter: Payer: Self-pay | Admitting: *Deleted

## 2014-09-26 NOTE — Telephone Encounter (Signed)
Called patient to give results, no answers, no voice mail, letter will be mailed with results

## 2014-10-06 DIAGNOSIS — I10 Essential (primary) hypertension: Secondary | ICD-10-CM | POA: Diagnosis not present

## 2014-10-17 DIAGNOSIS — H3531 Nonexudative age-related macular degeneration: Secondary | ICD-10-CM | POA: Diagnosis not present

## 2014-10-17 DIAGNOSIS — H4011X2 Primary open-angle glaucoma, moderate stage: Secondary | ICD-10-CM | POA: Diagnosis not present

## 2014-10-21 DIAGNOSIS — K219 Gastro-esophageal reflux disease without esophagitis: Secondary | ICD-10-CM | POA: Diagnosis not present

## 2014-10-21 DIAGNOSIS — I10 Essential (primary) hypertension: Secondary | ICD-10-CM | POA: Diagnosis not present

## 2014-11-08 ENCOUNTER — Ambulatory Visit (INDEPENDENT_AMBULATORY_CARE_PROVIDER_SITE_OTHER): Payer: Medicare Other | Admitting: Neurology

## 2014-11-08 ENCOUNTER — Encounter: Payer: Self-pay | Admitting: Neurology

## 2014-11-08 VITALS — BP 142/73 | HR 55 | Ht 64.5 in | Wt 160.0 lb

## 2014-11-08 DIAGNOSIS — G451 Carotid artery syndrome (hemispheric): Secondary | ICD-10-CM | POA: Diagnosis not present

## 2014-11-08 DIAGNOSIS — M5410 Radiculopathy, site unspecified: Secondary | ICD-10-CM

## 2014-11-08 DIAGNOSIS — M549 Dorsalgia, unspecified: Secondary | ICD-10-CM

## 2014-11-08 DIAGNOSIS — G8929 Other chronic pain: Secondary | ICD-10-CM | POA: Insufficient documentation

## 2014-11-08 DIAGNOSIS — I10 Essential (primary) hypertension: Secondary | ICD-10-CM | POA: Diagnosis not present

## 2014-11-08 DIAGNOSIS — E785 Hyperlipidemia, unspecified: Secondary | ICD-10-CM

## 2014-11-08 DIAGNOSIS — I639 Cerebral infarction, unspecified: Secondary | ICD-10-CM | POA: Diagnosis not present

## 2014-11-08 NOTE — Patient Instructions (Signed)
-   continue ASA and plavix for another one month and then plavix alone - continue pravastatin for stroke prevention - check BP at home - Follow up with your primary care physician for stroke risk factor modification. Recommend maintain blood pressure goal <130/80, diabetes with hemoglobin A1c goal below 6.5% and lipids with LDL cholesterol goal below 70 mg/dL.  - will check nerve conduction study for left leg numbness and weakness - refer to outpt PT  - follow up in 3 months.

## 2014-11-08 NOTE — Progress Notes (Signed)
STROKE NEUROLOGY FOLLOW UP NOTE  NAME: Angela Keith DOB: 04-Feb-1930  REASON FOR VISIT: stroke follow up HISTORY FROM: pt and chart  Today we had the pleasure of seeing Angela Keith in follow-up at our Neurology Clinic. Pt was accompanied by daughter-in-law.   History Summary Angela Keith is an 81 y.o. female with a history of HTN, HLD, hypothyroidism and DJD s/p back surgery in 2004, was admitted on 09/05/14 for right facial weakness and slurred speech. Symptoms were fluctuating in ER and tPA was given. Patient was subsequently admitted to neuro intensive care unit for post TPA management. Symptoms resolved by the next day. MRI negative and stroke work up showed intracranial athero. Put on dural antiplatelet. High LDL and put on pravastatin due to lipito/crestor intolerance. Other stroke work up unremarkable. She was discharged in good condition with home PT.   Interval History During the interval time, the patient has been doing well. No recurrent symptoms. However, she stated that after discharge she has intermittent left leg numbness from bottom of foot to the hip. It can happen during walking or sitting. Currently she did not have any numbness. Her BP today 142/73 but she said she is 120s at home.  REVIEW OF SYSTEMS: Full 14 system review of systems performed and notable only for those listed below and in HPI above, all others are negative:  Constitutional:  fatigue Cardiovascular:  Ear/Nose/Throat:  Hearing loss, ringing in ears Skin:  Eyes:   Respiratory:   Gastroitestinal:  nausea Genitourinary:  Hematology/Lymphatic:   Endocrine:  Musculoskeletal:  Back pain, walking difficulty Allergy/Immunology:   Neurological:  HA, numbness, weakness, tremors Psychiatric:  Sleep:   The following represents the patient's updated allergies and side effects list: Allergies  Allergen Reactions  . Ace Inhibitors Other (See Comments)    Severe fatigue  . Lipitor [Atorvastatin]  Other (See Comments)    Mood swings  . Motrin [Ibuprofen] Nausea And Vomiting    The neurologically relevant items on the patient's problem list were reviewed on today's visit.  Neurologic Examination  A problem focused neurological exam (12 or more points of the single system neurologic examination, vital signs counts as 1 point, cranial nerves count for 8 points) was performed.  Blood pressure 142/73, pulse 55, height 5' 4.5" (1.638 m), weight 160 lb (72.576 kg).  General - Well nourished, well developed, in no apparent distress.  Ophthalmologic - Sharp disc margins OU.  Cardiovascular - Regular rate and rhythm with no murmur.  Mental Status -  Level of arousal and orientation to time, place, and person were intact. Language including expression, naming, repetition, comprehension was assessed and found intact.  Cranial Nerves II - XII - II - Visual field intact OU. III, IV, VI - Extraocular movements intact. V - Facial sensation intact bilaterally. VII - Facial movement intact bilaterally. VIII - Hearing & vestibular intact bilaterally. X - Palate elevates symmetrically. XI - Chin turning & shoulder shrug intact bilaterally. XII - Tongue protrusion intact.  Motor Strength - The patient's strength was normal in all extremities and pronator drift was absent.  Bulk was normal and fasciculations were absent.   Motor Tone - Muscle tone was assessed at the neck and appendages and was normal.  Reflexes - The patient's reflexes were 1+ in all extremities and she had no pathological reflexes.  Sensory - Light touch, temperature/pinprick were assessed and were normal.    Coordination - The patient had normal movements in the hands and feet  with no ataxia or dysmetria.  Tremor was absent.  Gait and Station - The patient's transfers, posture, gait, station, and turns were observed as normal.  Data reviewed: I personally reviewed the images and agree with the radiology  interpretations.  Ct Head Wo Contrast 09/05/2014 Mild diffuse cortical atrophy. Mild chronic ischemic white matter disease. No acute intracranial abnormality seen.   Mr Brain Wo Contrast 09/06/2014 Negative for acute infarct or hemorrhage. Atrophy and mild chronic microvascular ischemia Intracranial atherosclerotic disease as above.  Mr Maxine Glenn Head/brain Wo Cm 09/06/2014 No large vessel occlusion.   2D Echocardiogram LVEF 65-70%, mild LVH, normal wall motion, diastolic dysfunction, indeterminate LV filling pressure, no significant valvulardisease. No source of embolus.   Carotid Doppler Mild heterogeneous plaque carotid bifurcations bilaterally. 1-39% carotid stenosis bilaterally.  Component     Latest Ref Rng 09/06/2014  Cholesterol     0 - 200 mg/dL 409  Triglycerides     <150 mg/dL 51  HDL Cholesterol     >39 mg/dL 53  Total CHOL/HDL Ratio      3.8  VLDL     0 - 40 mg/dL 10  LDL (calc)     0 - 99 mg/dL 811 (H)  Hemoglobin B1Y     4.8 - 5.6 % 6.0 (H)  Mean Plasma Glucose      126    Assessment: As you may recall, she is a 81 y.o. Caucasian female with PMH of HTN, HLD, hypothyroidism and DJD s/p back surgery in 2004, was admitted on 09/05/14 for right facial weakness and slurred speech. Symptoms were fluctuating in ER and tPA was given. Symptoms resolved by the next day. MRI negative and stroke work up showed intracranial athero. Put on dural antiplatelet and pravastatine. During the interval time, no recurrent symptoms. Today she complain of intermittent left leg numbness, likely due to known lumbar DJD. Will refer to outpt PT and do EMG.   Plan:  - continue ASA and plavix for total 3 months and then plavix alone - continue pravastatin for stroke prevention - check BP at home - Follow up with your primary care physician for stroke risk factor modification. Recommend maintain blood pressure goal <130/80, diabetes with hemoglobin A1c goal below 6.5% and lipids with LDL  cholesterol goal below 70 mg/dL.  - EMG/NCS for lumbar radiculopathy - refer to outpt PT  - RTC in 3 months.  Orders Placed This Encounter  Procedures  . Ambulatory referral to Physical Therapy    Referral Priority:  Routine    Referral Type:  Physical Medicine    Referral Reason:  Specialty Services Required    Requested Specialty:  Physical Therapy    Number of Visits Requested:  1  . NCV with EMG(electromyography)    Rule out left LE radiculopathy    Standing Status: Future     Number of Occurrences:      Standing Expiration Date: 11/08/2015    Order Specific Question:  Where should this test be performed?    Answer:  [facility]    Meds ordered this encounter  Medications  . metoprolol tartrate (LOPRESSOR) 25 MG tablet    Sig: Take 25 mg by mouth 2 (two) times daily.  . pantoprazole (PROTONIX) 40 MG tablet    Sig: Take 40 mg by mouth 2 (two) times daily.  Marland Kitchen amLODipine (NORVASC) 5 MG tablet    Sig: Take 5 mg by mouth daily.  Marland Kitchen losartan-hydrochlorothiazide (HYZAAR) 50-12.5 MG per tablet    Sig: Take  1 tablet by mouth daily.    Patient Instructions  - continue ASA and plavix for another one month and then plavix alone - continue pravastatin for stroke prevention - check BP at home - Follow up with your primary care physician for stroke risk factor modification. Recommend maintain blood pressure goal <130/80, diabetes with hemoglobin A1c goal below 6.5% and lipids with LDL cholesterol goal below 70 mg/dL.  - will check nerve conduction study for left leg numbness and weakness - refer to outpt PT  - follow up in 3 months.    Marvel PlanJindong Haylin Camilli, MD PhD [facility] [address], [address] [place], [place] [address] [phone]

## 2014-11-10 DIAGNOSIS — I10 Essential (primary) hypertension: Secondary | ICD-10-CM | POA: Insufficient documentation

## 2014-11-10 DIAGNOSIS — G451 Carotid artery syndrome (hemispheric): Secondary | ICD-10-CM | POA: Insufficient documentation

## 2014-11-10 DIAGNOSIS — E785 Hyperlipidemia, unspecified: Secondary | ICD-10-CM | POA: Insufficient documentation

## 2014-11-15 ENCOUNTER — Ambulatory Visit (INDEPENDENT_AMBULATORY_CARE_PROVIDER_SITE_OTHER): Payer: Medicare Other | Admitting: Neurology

## 2014-11-15 ENCOUNTER — Ambulatory Visit (INDEPENDENT_AMBULATORY_CARE_PROVIDER_SITE_OTHER): Payer: Self-pay | Admitting: Neurology

## 2014-11-15 ENCOUNTER — Encounter: Payer: Self-pay | Admitting: Neurology

## 2014-11-15 DIAGNOSIS — M5417 Radiculopathy, lumbosacral region: Secondary | ICD-10-CM

## 2014-11-15 DIAGNOSIS — Z0289 Encounter for other administrative examinations: Secondary | ICD-10-CM

## 2014-11-15 DIAGNOSIS — M5410 Radiculopathy, site unspecified: Secondary | ICD-10-CM

## 2014-11-15 NOTE — Progress Notes (Signed)
See procedure note.

## 2014-11-17 DIAGNOSIS — M4806 Spinal stenosis, lumbar region: Secondary | ICD-10-CM | POA: Diagnosis not present

## 2014-11-17 DIAGNOSIS — Z6826 Body mass index (BMI) 26.0-26.9, adult: Secondary | ICD-10-CM | POA: Diagnosis not present

## 2014-11-17 DIAGNOSIS — I10 Essential (primary) hypertension: Secondary | ICD-10-CM | POA: Diagnosis not present

## 2014-11-17 NOTE — Progress Notes (Signed)
  [facility]    Provider:  Dr Lucia GaskinsAhern Referring Provider: Marden NobleGates, Robert, MD Primary Care Physician:  Pearla DubonnetGATES,[name], MD  HPI:  Angela Keith is a 81 y.o. female here for evaluation of low back pain and left leg pain. The pain starts at the hip and radiates down the side of the left leg to the foot. Patient reports her leg "gives out". The right leg is unaffected.  Summary: Nerve conduction studies were performed on the bilateral lower extremities.  Bilateral Peroneal  motor conductions were within normal limits. The left tibial motor nerve showed reduced amplitude (2.0 mV, N>3)  The right tibial motor nerve showed reduced amplitude (2.0 mV, N>3)  Bilateral Sural and Peroneal sensory conductions were within normal limits Bilateral H Reflexes were within normal limits F Wave studies indicate that the Right peroneal F wave has prolonged latency (58.6 ms, N<56).  The Right tibial F wave has prolonged latency (58.4 ms, N<58). The left-sided F Wave latencies were normal limits.    EMG needle study was performed on selected left lower extremity muscles: The left Biceps Femoris(long head) showed diminished recruitment. The left gluteus maximus showed polyphasic motor units, prolonged duration and diminished recruitment. The left Iliopsoas, left Vastus Medialis, left Anterior Tibialis, left Medial Gastrocnemius, left Gluteus Medius muscles were within normal limits. Could not perform EMG needle exam on lumbar paraspinals as those are unreliable after surgery.     Conclusion: This is an abnormal study.   1. The chronic neurogenic changes seen in left proximal leg muscles that are supplied by the S1>L5 nerve roots in conjunction with  normal sensory conductions and reduced tibial motor potential suggest a remote S1>L5 radiculopathy. No denervation was seen to indicate acute or ongoing radiculopathy.  2. Prolonged right-sided F-Responses and reduced tibial motor potential  may  be due to S1>L5 radiculopathy however patient is asymptomatic on the right. 3. Clinical correlation recommended    Angela DeanAntonia Xzavier Swinger, MD  [facility] [address] [place], [place] [address]  Phone [phone] Fax [phone]

## 2014-11-17 NOTE — Procedures (Signed)
[facility]    Provider:  Dr Lucia GaskinsAhern Referring Provider: Marden NobleGates, Robert, MD Primary Care Physician:  Pearla DubonnetGATES,[name], MD  HPI:  Norva PavlovRachel E Keith is a 81 y.o. female here for evaluation of low back pain and left leg pain. The pain starts at the hip and radiates down the side of the left leg to the foot. Patient reports her leg "gives out". The right leg is unaffected.  Summary: Nerve conduction studies were performed on the bilateral lower extremities.  Bilateral Peroneal  motor conductions were within normal limits. The left tibial motor nerve showed reduced amplitude (2.0 mV, N>3)  The right tibial motor nerve showed reduced amplitude (2.0 mV, N>3)  Bilateral Sural and Peroneal sensory conductions were within normal limits Bilateral H Reflexes were within normal limits F Wave studies indicate that the Right peroneal F wave has prolonged latency (58.6 ms, N<56).  The Right tibial F wave has prolonged latency (58.4 ms, N<58). The left-sided F Wave latencies were normal limits.    EMG needle study was performed on selected left lower extremity muscles: The left Biceps Femoris(long head) showed diminished recruitment. The left gluteus maximus showed polyphasic motor units, prolonged duration and diminished recruitment. The left Iliopsoas, left Vastus Medialis, left Anterior Tibialis, left Medial Gastrocnemius, left Gluteus Medius muscles were within normal limits. Could not perform EMG needle exam on lumbar paraspinals as those are unreliable after surgery.     Conclusion: This is an abnormal study.   1. The chronic neurogenic changes seen in left proximal leg muscles that are supplied by the S1>L5 nerve roots in conjunction with  normal sensory conductions and reduced tibial motor potential suggest a remote S1>L5 radiculopathy. No denervation was seen to indicate acute or ongoing radiculopathy.  2. Prolonged right-sided F-Responses and reduced tibial motor potential  may  be due to S1>L5 radiculopathy however patient is asymptomatic on the right. 3. Clinical correlation recommended    Naomie DeanAntonia Davie Sagona, MD  [facility] [address] [place], [place] [address]  Phone [phone] Fax [phone]

## 2014-11-29 DIAGNOSIS — M4806 Spinal stenosis, lumbar region: Secondary | ICD-10-CM | POA: Diagnosis not present

## 2014-12-12 DIAGNOSIS — M5416 Radiculopathy, lumbar region: Secondary | ICD-10-CM | POA: Diagnosis not present

## 2014-12-12 DIAGNOSIS — Z6826 Body mass index (BMI) 26.0-26.9, adult: Secondary | ICD-10-CM | POA: Diagnosis not present

## 2014-12-12 DIAGNOSIS — M4806 Spinal stenosis, lumbar region: Secondary | ICD-10-CM | POA: Diagnosis not present

## 2014-12-16 DIAGNOSIS — M4806 Spinal stenosis, lumbar region: Secondary | ICD-10-CM | POA: Diagnosis not present

## 2014-12-21 DIAGNOSIS — M5136 Other intervertebral disc degeneration, lumbar region: Secondary | ICD-10-CM | POA: Diagnosis not present

## 2014-12-21 DIAGNOSIS — M4806 Spinal stenosis, lumbar region: Secondary | ICD-10-CM | POA: Diagnosis not present

## 2014-12-21 DIAGNOSIS — M47816 Spondylosis without myelopathy or radiculopathy, lumbar region: Secondary | ICD-10-CM | POA: Diagnosis not present

## 2014-12-21 DIAGNOSIS — M5416 Radiculopathy, lumbar region: Secondary | ICD-10-CM | POA: Diagnosis not present

## 2014-12-29 ENCOUNTER — Other Ambulatory Visit: Payer: Self-pay | Admitting: Neurosurgery

## 2014-12-29 DIAGNOSIS — Z6826 Body mass index (BMI) 26.0-26.9, adult: Secondary | ICD-10-CM | POA: Diagnosis not present

## 2014-12-29 DIAGNOSIS — M4806 Spinal stenosis, lumbar region: Secondary | ICD-10-CM | POA: Diagnosis not present

## 2014-12-30 ENCOUNTER — Telehealth: Payer: Self-pay | Admitting: Neurology

## 2014-12-30 NOTE — Telephone Encounter (Signed)
Patient had a left brain TIA on 09/05/14. She may stop Plavix for 3 days prior to scheduled procedure with a small but acceptable preprocedure risk of TIA/stroke if acceptable to the patient

## 2014-12-30 NOTE — Telephone Encounter (Signed)
Patient's daughter-in-law is calling regarding the patient. Dr. Dutch Quint is doing surgery on the patient on 01-17-15 and the patient needs to be off clopidogrel (PLAVIX) 75 MG tablet for 3 days. Is this ok? Please call and advise or send through patient's my chart. Thank you.

## 2015-01-02 ENCOUNTER — Telehealth: Payer: Self-pay

## 2015-01-02 ENCOUNTER — Other Ambulatory Visit: Payer: Self-pay

## 2015-01-02 NOTE — Telephone Encounter (Signed)
Spoke with daughter-in-law, Chaya JanDena and gave her Dr Marlis EdelsonSethi's detailed recommendation. She stated she had no further questions, and she verbalized understanding, appreciation.

## 2015-01-02 NOTE — Telephone Encounter (Signed)
Informed Patients daughter-n-law that patient may stop Plavix for 3 days prior to scheduled procedure with a small but acceptable preprocedure risk of TIA/stroke if acceptable to the patient.  She verbalized understanding.

## 2015-01-10 DIAGNOSIS — K219 Gastro-esophageal reflux disease without esophagitis: Secondary | ICD-10-CM | POA: Diagnosis not present

## 2015-01-10 DIAGNOSIS — M5416 Radiculopathy, lumbar region: Secondary | ICD-10-CM | POA: Diagnosis not present

## 2015-01-10 DIAGNOSIS — I639 Cerebral infarction, unspecified: Secondary | ICD-10-CM | POA: Diagnosis not present

## 2015-01-10 DIAGNOSIS — I10 Essential (primary) hypertension: Secondary | ICD-10-CM | POA: Diagnosis not present

## 2015-01-11 ENCOUNTER — Encounter (HOSPITAL_COMMUNITY)
Admission: RE | Admit: 2015-01-11 | Discharge: 2015-01-11 | Disposition: A | Discharge status: Home / Self Care | Payer: Medicare Other | Source: Ambulatory Visit | Attending: Neurosurgery | Admitting: Neurosurgery

## 2015-01-11 ENCOUNTER — Encounter (HOSPITAL_COMMUNITY): Payer: Self-pay

## 2015-01-11 DIAGNOSIS — E785 Hyperlipidemia, unspecified: Secondary | ICD-10-CM | POA: Diagnosis not present

## 2015-01-11 DIAGNOSIS — Z01812 Encounter for preprocedural laboratory examination: Secondary | ICD-10-CM | POA: Diagnosis not present

## 2015-01-11 DIAGNOSIS — I4510 Unspecified right bundle-branch block: Secondary | ICD-10-CM | POA: Diagnosis not present

## 2015-01-11 DIAGNOSIS — E039 Hypothyroidism, unspecified: Secondary | ICD-10-CM | POA: Diagnosis not present

## 2015-01-11 DIAGNOSIS — I10 Essential (primary) hypertension: Secondary | ICD-10-CM | POA: Diagnosis not present

## 2015-01-11 HISTORY — DX: Reserved for inherently not codable concepts without codable children: IMO0001

## 2015-01-11 LAB — BASIC METABOLIC PANEL
Anion gap: 7 (ref 5–15)
BUN: 19 mg/dL (ref 6–20)
CO2: 29 mmol/L (ref 22–32)
Calcium: 9.8 mg/dL (ref 8.9–10.3)
Chloride: 101 mmol/L (ref 101–111)
Creatinine, Ser: 1.06 mg/dL — ABNORMAL HIGH (ref 0.44–1.00)
GFR calc Af Amer: 55 mL/min — ABNORMAL LOW (ref >60)
GFR calc non Af Amer: 48 mL/min — ABNORMAL LOW (ref >60)
Glucose, Bld: 102 mg/dL — ABNORMAL HIGH (ref 65–99)
Potassium: 3.7 mmol/L (ref 3.5–5.1)
Sodium: 137 mmol/L (ref 135–145)

## 2015-01-11 LAB — CBC WITH DIFFERENTIAL/PLATELET
Basophils Absolute: 0 10*3/uL (ref 0.0–0.1)
Basophils Relative: 1 % (ref 0–1)
Eosinophils Absolute: 0.2 10*3/uL (ref 0.0–0.7)
Eosinophils Relative: 4 % (ref 0–5)
HCT: 40.4 % (ref 36.0–46.0)
Hemoglobin: 13 g/dL (ref 12.0–15.0)
Lymphocytes Relative: 35 % (ref 12–46)
Lymphs Abs: 2.3 10*3/uL (ref 0.7–4.0)
MCH: 28.1 pg (ref 26.0–34.0)
MCHC: 32.2 g/dL (ref 30.0–36.0)
MCV: 87.4 fL (ref 78.0–100.0)
Monocytes Absolute: 0.7 10*3/uL (ref 0.1–1.0)
Monocytes Relative: 11 % (ref 3–12)
Neutro Abs: 3.3 10*3/uL (ref 1.7–7.7)
Neutrophils Relative %: 49 % (ref 43–77)
Platelets: 261 10*3/uL (ref 150–400)
RBC: 4.62 MIL/uL (ref 3.87–5.11)
RDW: 14.2 % (ref 11.5–15.5)
WBC: 6.5 10*3/uL (ref 4.0–10.5)

## 2015-01-11 LAB — SURGICAL PCR SCREEN
MRSA, PCR: NEGATIVE
Staphylococcus aureus: NEGATIVE

## 2015-01-11 NOTE — Progress Notes (Signed)
Anesthesia Chart Review:  Pt is 81 year old female scheduled for L2-3, L3-4 laminecotmy and foraminotomy on 01/17/2015 with Dr. Jordan LikesPool.   PCP is Dr. Marden Nobleobert Gates.   PMH includes: HTN, RBBB, TIA (09/05/14), stroke, glaucoma, hyperlipidemia, GERD, hypothyroidism, hearing loss. Never smoker. BMI 24.   Medications include: amlodipine, plavix, dorzolamide-timolol, levothyroxine, losartan-hctz, metoprolol, protonix, pravastatin. Plavix to be stopped 3 days prior to surgery.   Preoperative labs reviewed.    EKG 09/05/2014: Sinus rhythm. Ventricular premature complex LAE, consider biatrial enlargement. Right bundle branch block. Abnormal inferior Q waves  Carotid duplex US 09/07/2014: -1-39% carotid stenosis bilaterally -vertebrals are patent with antegrade flow  Echo 09/06/2014: - LVEF 65-70%, mild LVH, normal wall motion, diastolic dysfunction, indeterminate LV filling pressure, no significant valvular disease.  Cardiac event monitor 03/24/2014: No arrhythmia.  Dr. Pearlean BrownieSethi with neurology is aware of pt's surgery and gave ok to hold plavix for 3 days prior to surgery in Epic telephone encounter dated 01/02/2015.   Rica Mastngela Eusevio Schriver, FNP-BC [facility] Phone: [phone] 01/11/2015 4:57 PM

## 2015-01-11 NOTE — Pre-Procedure Instructions (Signed)
    Angela LangtonRachel E Keith  01/11/2015      [facility] - [place], [place] - [address] [place] [address] Phone: [phone] Fax: [phone]    Your procedure is scheduled on 01/17/15.  [facility].M.  Call this number if you have problems the morning of surgery:  [phone]   Remember:  Do not eat food or drink liquids after midnight.  Take these medicines the morning of surgery with A SIP OF WATER tylenol,norvasc,eye drops,synthroid,lopressor,protonix   Do not wear jewelry, make-up or nail polish.  Do not wear lotions, powders, or perfumes.  You may wear deodorant.  Do not shave 48 hours prior to surgery.  Men may shave face and neck.  Do not bring valuables to the hospital.  [facility] is not responsible for any belongings or valuables.  Contacts, dentures or bridgework may not be worn into surgery.  Leave your suitcase in the car.  After surgery it may be brought to your room.  For patients admitted to the hospital, discharge time will be determined by your treatment team.  Patients discharged the day of surgery will not be allowed to drive home.   Name and phone number of your driver:    Special instructions:    Please read over the following fact sheets that you were given. Pain Booklet, Coughing and Deep Breathing, MRSA Information and Surgical Site Infection Prevention

## 2015-01-16 MED ORDER — CEFAZOLIN SODIUM-DEXTROSE 2-3 GM-% IV SOLR
2.0000 g | INTRAVENOUS | Status: AC
  Administered 2015-01-17: 2 g via INTRAVENOUS
  Filled 2015-01-16: qty 50

## 2015-01-16 MED ORDER — DEXAMETHASONE SODIUM PHOSPHATE 10 MG/ML IJ SOLN
10.0000 mg | INTRAMUSCULAR | Status: AC
  Administered 2015-01-17: 10 mg via INTRAVENOUS
  Filled 2015-01-16: qty 1

## 2015-01-17 ENCOUNTER — Encounter (HOSPITAL_COMMUNITY): Payer: Self-pay | Admitting: Anesthesiology

## 2015-01-17 ENCOUNTER — Encounter (HOSPITAL_COMMUNITY)
Admission: RE | Disposition: A | Discharge status: Home / Self Care | Payer: Self-pay | Source: Ambulatory Visit | Attending: Neurosurgery

## 2015-01-17 ENCOUNTER — Ambulatory Visit (HOSPITAL_COMMUNITY)
Admission: RE | Admit: 2015-01-17 | Discharge: 2015-01-17 | Disposition: A | Discharge status: Home / Self Care | Payer: Medicare Other | Source: Ambulatory Visit | Attending: Neurosurgery | Admitting: Neurosurgery

## 2015-01-17 ENCOUNTER — Inpatient Hospital Stay (HOSPITAL_COMMUNITY): Payer: Medicare Other | Admitting: Anesthesiology

## 2015-01-17 ENCOUNTER — Inpatient Hospital Stay (HOSPITAL_COMMUNITY): Payer: Medicare Other

## 2015-01-17 ENCOUNTER — Inpatient Hospital Stay (HOSPITAL_COMMUNITY): Payer: Medicare Other | Admitting: Emergency Medicine

## 2015-01-17 DIAGNOSIS — M4326 Fusion of spine, lumbar region: Secondary | ICD-10-CM | POA: Diagnosis not present

## 2015-01-17 DIAGNOSIS — M7138 Other bursal cyst, other site: Secondary | ICD-10-CM

## 2015-01-17 DIAGNOSIS — E039 Hypothyroidism, unspecified: Secondary | ICD-10-CM

## 2015-01-17 DIAGNOSIS — R29810 Facial weakness: Secondary | ICD-10-CM | POA: Diagnosis not present

## 2015-01-17 DIAGNOSIS — I63421 Cerebral infarction due to embolism of right anterior cerebral artery: Secondary | ICD-10-CM | POA: Diagnosis not present

## 2015-01-17 DIAGNOSIS — I4510 Unspecified right bundle-branch block: Secondary | ICD-10-CM | POA: Diagnosis present

## 2015-01-17 DIAGNOSIS — Z888 Allergy status to other drugs, medicaments and biological substances status: Secondary | ICD-10-CM

## 2015-01-17 DIAGNOSIS — R351 Nocturia: Secondary | ICD-10-CM

## 2015-01-17 DIAGNOSIS — M48062 Spinal stenosis, lumbar region with neurogenic claudication: Secondary | ICD-10-CM | POA: Diagnosis present

## 2015-01-17 DIAGNOSIS — Z8673 Personal history of transient ischemic attack (TIA), and cerebral infarction without residual deficits: Secondary | ICD-10-CM

## 2015-01-17 DIAGNOSIS — M5136 Other intervertebral disc degeneration, lumbar region: Secondary | ICD-10-CM

## 2015-01-17 DIAGNOSIS — M47892 Other spondylosis, cervical region: Secondary | ICD-10-CM | POA: Diagnosis present

## 2015-01-17 DIAGNOSIS — I10 Essential (primary) hypertension: Secondary | ICD-10-CM

## 2015-01-17 DIAGNOSIS — G9519 Other vascular myelopathies: Secondary | ICD-10-CM | POA: Insufficient documentation

## 2015-01-17 DIAGNOSIS — R0602 Shortness of breath: Secondary | ICD-10-CM | POA: Diagnosis not present

## 2015-01-17 DIAGNOSIS — Z79899 Other long term (current) drug therapy: Secondary | ICD-10-CM

## 2015-01-17 DIAGNOSIS — H409 Unspecified glaucoma: Secondary | ICD-10-CM | POA: Diagnosis present

## 2015-01-17 DIAGNOSIS — Z419 Encounter for procedure for purposes other than remedying health state, unspecified: Secondary | ICD-10-CM

## 2015-01-17 DIAGNOSIS — G8194 Hemiplegia, unspecified affecting left nondominant side: Secondary | ICD-10-CM | POA: Diagnosis present

## 2015-01-17 DIAGNOSIS — K219 Gastro-esophageal reflux disease without esophagitis: Secondary | ICD-10-CM | POA: Insufficient documentation

## 2015-01-17 DIAGNOSIS — M48061 Spinal stenosis, lumbar region without neurogenic claudication: Secondary | ICD-10-CM | POA: Diagnosis present

## 2015-01-17 DIAGNOSIS — R51 Headache: Secondary | ICD-10-CM | POA: Diagnosis not present

## 2015-01-17 DIAGNOSIS — G459 Transient cerebral ischemic attack, unspecified: Secondary | ICD-10-CM | POA: Diagnosis not present

## 2015-01-17 DIAGNOSIS — R200 Anesthesia of skin: Secondary | ICD-10-CM | POA: Diagnosis not present

## 2015-01-17 DIAGNOSIS — G8929 Other chronic pain: Secondary | ICD-10-CM

## 2015-01-17 DIAGNOSIS — H9190 Unspecified hearing loss, unspecified ear: Secondary | ICD-10-CM | POA: Diagnosis present

## 2015-01-17 DIAGNOSIS — Z9889 Other specified postprocedural states: Secondary | ICD-10-CM | POA: Diagnosis not present

## 2015-01-17 DIAGNOSIS — M419 Scoliosis, unspecified: Secondary | ICD-10-CM | POA: Insufficient documentation

## 2015-01-17 DIAGNOSIS — Z7901 Long term (current) use of anticoagulants: Secondary | ICD-10-CM

## 2015-01-17 DIAGNOSIS — M4806 Spinal stenosis, lumbar region: Secondary | ICD-10-CM | POA: Insufficient documentation

## 2015-01-17 DIAGNOSIS — M1990 Unspecified osteoarthritis, unspecified site: Secondary | ICD-10-CM | POA: Diagnosis not present

## 2015-01-17 DIAGNOSIS — G451 Carotid artery syndrome (hemispheric): Secondary | ICD-10-CM | POA: Diagnosis present

## 2015-01-17 DIAGNOSIS — E785 Hyperlipidemia, unspecified: Secondary | ICD-10-CM | POA: Diagnosis present

## 2015-01-17 HISTORY — PX: LUMBAR LAMINECTOMY/DECOMPRESSION MICRODISCECTOMY: SHX5026

## 2015-01-17 SURGERY — LUMBAR LAMINECTOMY/DECOMPRESSION MICRODISCECTOMY 2 LEVELS
Anesthesia: General | Site: Back

## 2015-01-17 SURGERY — Surgical Case
Anesthesia: *Unknown

## 2015-01-17 MED ORDER — LEVOTHYROXINE SODIUM 88 MCG PO TABS
88.0000 ug | ORAL_TABLET | Freq: Every day | ORAL | Status: DC
  Filled 2015-01-17: qty 1

## 2015-01-17 MED ORDER — LOSARTAN POTASSIUM-HCTZ 50-12.5 MG PO TABS: 1.0000 | ORAL_TABLET | Freq: Every day | ORAL | Status: DC

## 2015-01-17 MED ORDER — PROPOFOL 10 MG/ML IV BOLUS
INTRAVENOUS | Status: AC
  Filled 2015-01-17: qty 20

## 2015-01-17 MED ORDER — CEFAZOLIN SODIUM 1-5 GM-% IV SOLN
1.0000 g | Freq: Three times a day (TID) | INTRAVENOUS | Status: DC
  Filled 2015-01-17 (×2): qty 50

## 2015-01-17 MED ORDER — GLYCOPYRROLATE 0.2 MG/ML IJ SOLN
INTRAMUSCULAR | Status: DC | PRN
  Administered 2015-01-17: 0.6 mg via INTRAVENOUS
  Administered 2015-01-17: 0.4 mg via INTRAVENOUS

## 2015-01-17 MED ORDER — FENTANYL CITRATE (PF) 250 MCG/5ML IJ SOLN
INTRAMUSCULAR | Status: AC
  Filled 2015-01-17: qty 5

## 2015-01-17 MED ORDER — PHENYLEPHRINE 40 MCG/ML (10ML) SYRINGE FOR IV PUSH (FOR BLOOD PRESSURE SUPPORT)
PREFILLED_SYRINGE | INTRAVENOUS | Status: AC
  Filled 2015-01-17: qty 10

## 2015-01-17 MED ORDER — HYDROMORPHONE HCL 1 MG/ML IJ SOLN: 0.5000 mg | INTRAMUSCULAR | Status: DC | PRN

## 2015-01-17 MED ORDER — PROPOFOL 10 MG/ML IV BOLUS
INTRAVENOUS | Status: DC | PRN
  Administered 2015-01-17: 130 mg via INTRAVENOUS

## 2015-01-17 MED ORDER — VANCOMYCIN HCL 1000 MG IV SOLR
INTRAVENOUS | Status: DC | PRN
  Administered 2015-01-17: 1000 mg

## 2015-01-17 MED ORDER — SODIUM CHLORIDE 0.9 % IJ SOLN: 3.0000 mL | INTRAMUSCULAR | Status: DC | PRN

## 2015-01-17 MED ORDER — DORZOLAMIDE HCL-TIMOLOL MAL 2-0.5 % OP SOLN
1.0000 [drp] | Freq: Two times a day (BID) | OPHTHALMIC | Status: DC
  Filled 2015-01-17: qty 10

## 2015-01-17 MED ORDER — PHENYLEPHRINE HCL 10 MG/ML IJ SOLN
INTRAMUSCULAR | Status: DC | PRN
  Administered 2015-01-17: 80 ug via INTRAVENOUS
  Administered 2015-01-17: 40 ug via INTRAVENOUS

## 2015-01-17 MED ORDER — PRAVASTATIN SODIUM 40 MG PO TABS
40.0000 mg | ORAL_TABLET | Freq: Every day | ORAL | Status: DC
  Filled 2015-01-17: qty 1

## 2015-01-17 MED ORDER — HYDROCODONE-ACETAMINOPHEN 5-325 MG PO TABS: 1.0000 | ORAL_TABLET | ORAL | Status: DC | PRN

## 2015-01-17 MED ORDER — SODIUM CHLORIDE 0.9 % IV SOLN: 250.0000 mL | INTRAVENOUS | Status: DC

## 2015-01-17 MED ORDER — ROCURONIUM BROMIDE 100 MG/10ML IV SOLN
INTRAVENOUS | Status: DC | PRN
  Administered 2015-01-17: 30 mg via INTRAVENOUS

## 2015-01-17 MED ORDER — HYDROCHLOROTHIAZIDE 12.5 MG PO CAPS: 12.5000 mg | ORAL_CAPSULE | Freq: Every day | ORAL | Status: DC

## 2015-01-17 MED ORDER — LIDOCAINE HCL (CARDIAC) 20 MG/ML IV SOLN
INTRAVENOUS | Status: AC
  Filled 2015-01-17: qty 5

## 2015-01-17 MED ORDER — SODIUM CHLORIDE 0.9 % IJ SOLN
INTRAMUSCULAR | Status: AC
  Filled 2015-01-17: qty 10

## 2015-01-17 MED ORDER — CALCIUM CARBONATE 1250 (500 CA) MG PO TABS
1.0000 | ORAL_TABLET | Freq: Every day | ORAL | Status: DC
  Filled 2015-01-17 (×2): qty 1

## 2015-01-17 MED ORDER — PHENOL 1.4 % MT LIQD: 1.0000 | OROMUCOSAL | Status: DC | PRN

## 2015-01-17 MED ORDER — NEOSTIGMINE METHYLSULFATE 10 MG/10ML IV SOLN
INTRAVENOUS | Status: DC | PRN
  Administered 2015-01-17: 4 mg via INTRAVENOUS

## 2015-01-17 MED ORDER — LACTATED RINGERS IV SOLN
INTRAVENOUS | Status: DC | PRN
  Administered 2015-01-17 (×2): via INTRAVENOUS

## 2015-01-17 MED ORDER — GLYCOPYRROLATE 0.2 MG/ML IJ SOLN
INTRAMUSCULAR | Status: AC
  Filled 2015-01-17: qty 1

## 2015-01-17 MED ORDER — THROMBIN 5000 UNITS EX SOLR
CUTANEOUS | Status: DC | PRN
  Administered 2015-01-17 (×2): 5000 [IU] via TOPICAL

## 2015-01-17 MED ORDER — NITROGLYCERIN 0.4 MG SL SUBL: 0.4000 mg | SUBLINGUAL_TABLET | SUBLINGUAL | Status: DC | PRN

## 2015-01-17 MED ORDER — ONDANSETRON HCL 4 MG/2ML IJ SOLN
INTRAMUSCULAR | Status: DC | PRN
  Administered 2015-01-17: 4 mg via INTRAVENOUS

## 2015-01-17 MED ORDER — ALBUMIN HUMAN 5 % IV SOLN
INTRAVENOUS | Status: DC | PRN
  Administered 2015-01-17: 09:00:00 via INTRAVENOUS

## 2015-01-17 MED ORDER — CALCIUM CARBONATE 600 MG PO TABS
600.0000 mg | ORAL_TABLET | Freq: Every day | ORAL | Status: DC
  Filled 2015-01-17: qty 1

## 2015-01-17 MED ORDER — MENTHOL 3 MG MT LOZG: 1.0000 | LOZENGE | OROMUCOSAL | Status: DC | PRN

## 2015-01-17 MED ORDER — SUCCINYLCHOLINE CHLORIDE 20 MG/ML IJ SOLN
INTRAMUSCULAR | Status: AC
  Filled 2015-01-17: qty 1

## 2015-01-17 MED ORDER — EPHEDRINE SULFATE 50 MG/ML IJ SOLN
INTRAMUSCULAR | Status: DC | PRN
  Administered 2015-01-17 (×2): 10 mg via INTRAVENOUS

## 2015-01-17 MED ORDER — ROCURONIUM BROMIDE 50 MG/5ML IV SOLN
INTRAVENOUS | Status: AC
  Filled 2015-01-17: qty 1

## 2015-01-17 MED ORDER — METOPROLOL TARTRATE 25 MG PO TABS
25.0000 mg | ORAL_TABLET | Freq: Two times a day (BID) | ORAL | Status: DC
  Filled 2015-01-17: qty 1

## 2015-01-17 MED ORDER — ONDANSETRON HCL 4 MG/2ML IJ SOLN
INTRAMUSCULAR | Status: AC
  Filled 2015-01-17: qty 2

## 2015-01-17 MED ORDER — LATANOPROST 0.005 % OP SOLN
1.0000 [drp] | Freq: Every day | OPHTHALMIC | Status: DC
  Filled 2015-01-17: qty 2.5

## 2015-01-17 MED ORDER — BUPIVACAINE HCL (PF) 0.25 % IJ SOLN
INTRAMUSCULAR | Status: DC | PRN
  Administered 2015-01-17: 20 mL

## 2015-01-17 MED ORDER — PANTOPRAZOLE SODIUM 40 MG PO TBEC: 40.0000 mg | DELAYED_RELEASE_TABLET | Freq: Two times a day (BID) | ORAL | Status: DC

## 2015-01-17 MED ORDER — ACETAMINOPHEN 10 MG/ML IV SOLN
INTRAVENOUS | Status: AC
  Administered 2015-01-17: 1000 mg via INTRAVENOUS
  Filled 2015-01-17: qty 100

## 2015-01-17 MED ORDER — ACETAMINOPHEN 325 MG PO TABS: 650.0000 mg | ORAL_TABLET | ORAL | Status: DC | PRN

## 2015-01-17 MED ORDER — PHENYLEPHRINE HCL 10 MG/ML IJ SOLN
10.0000 mg | INTRAVENOUS | Status: DC | PRN
  Administered 2015-01-17: 20 ug/min via INTRAVENOUS

## 2015-01-17 MED ORDER — VANCOMYCIN HCL 1000 MG IV SOLR
INTRAVENOUS | Status: AC
  Filled 2015-01-17: qty 1000

## 2015-01-17 MED ORDER — HEMOSTATIC AGENTS (NO CHARGE) OPTIME
TOPICAL | Status: DC | PRN
  Administered 2015-01-17: 1 via TOPICAL

## 2015-01-17 MED ORDER — ONDANSETRON HCL 4 MG/2ML IJ SOLN: 4.0000 mg | Freq: Once | INTRAMUSCULAR | Status: DC | PRN

## 2015-01-17 MED ORDER — LIDOCAINE HCL (CARDIAC) 20 MG/ML IV SOLN
INTRAVENOUS | Status: DC | PRN
  Administered 2015-01-17: 100 mg via INTRAVENOUS
  Administered 2015-01-17: 100 mg via INTRATRACHEAL

## 2015-01-17 MED ORDER — CYCLOBENZAPRINE HCL 10 MG PO TABS: 10.0000 mg | ORAL_TABLET | Freq: Three times a day (TID) | ORAL | Status: DC | PRN

## 2015-01-17 MED ORDER — FENTANYL CITRATE (PF) 100 MCG/2ML IJ SOLN
INTRAMUSCULAR | Status: DC | PRN
  Administered 2015-01-17: 100 ug via INTRAVENOUS

## 2015-01-17 MED ORDER — SODIUM CHLORIDE 0.9 % IR SOLN
Status: DC | PRN
  Administered 2015-01-17: 500 mL

## 2015-01-17 MED ORDER — 0.9 % SODIUM CHLORIDE (POUR BTL) OPTIME
TOPICAL | Status: DC | PRN
  Administered 2015-01-17: 1000 mL

## 2015-01-17 MED ORDER — ONDANSETRON HCL 4 MG/2ML IJ SOLN: 4.0000 mg | INTRAMUSCULAR | Status: DC | PRN

## 2015-01-17 MED ORDER — SODIUM CHLORIDE 0.9 % IJ SOLN: 3.0000 mL | Freq: Two times a day (BID) | INTRAMUSCULAR | Status: DC

## 2015-01-17 MED ORDER — OXYCODONE-ACETAMINOPHEN 5-325 MG PO TABS: 1.0000 | ORAL_TABLET | ORAL | Status: DC | PRN

## 2015-01-17 MED ORDER — LOSARTAN POTASSIUM 50 MG PO TABS: 50.0000 mg | ORAL_TABLET | Freq: Every day | ORAL | Status: DC

## 2015-01-17 MED ORDER — EPHEDRINE SULFATE 50 MG/ML IJ SOLN
INTRAMUSCULAR | Status: AC
  Filled 2015-01-17: qty 1

## 2015-01-17 MED ORDER — ACETAMINOPHEN 650 MG RE SUPP: 650.0000 mg | RECTAL | Status: DC | PRN

## 2015-01-17 MED ORDER — AMLODIPINE BESYLATE 2.5 MG PO TABS: 2.5000 mg | ORAL_TABLET | Freq: Every day | ORAL | Status: DC

## 2015-01-17 SURGICAL SUPPLY — 56 items
ADH SKN CLS APL DERMABOND .7 (GAUZE/BANDAGES/DRESSINGS) ×1
APL SKNCLS STERI-STRIP NONHPOA (GAUZE/BANDAGES/DRESSINGS) ×1
BAG DECANTER FOR FLEXI CONT (MISCELLANEOUS) ×3 IMPLANT
BENZOIN TINCTURE PRP APPL 2/3 (GAUZE/BANDAGES/DRESSINGS) ×3 IMPLANT
BLADE CLIPPER SURG (BLADE) IMPLANT
BRUSH SCRUB EZ PLAIN DRY (MISCELLANEOUS) ×3 IMPLANT
BUR CUTTER 7.0 ROUND (BURR) ×3 IMPLANT
CANISTER SUCT 3000ML PPV (MISCELLANEOUS) ×3 IMPLANT
CLOSURE WOUND 1/2 X4 (GAUZE/BANDAGES/DRESSINGS) ×1
CONT SPEC 4OZ CLIKSEAL STRL BL (MISCELLANEOUS) ×3 IMPLANT
DECANTER SPIKE VIAL GLASS SM (MISCELLANEOUS) ×3 IMPLANT
DERMABOND ADVANCED (GAUZE/BANDAGES/DRESSINGS) ×2
DERMABOND ADVANCED .7 DNX12 (GAUZE/BANDAGES/DRESSINGS) IMPLANT
DRAPE LAPAROTOMY 100X72X124 (DRAPES) ×3 IMPLANT
DRAPE MICROSCOPE LEICA (MISCELLANEOUS) ×3 IMPLANT
DRAPE POUCH INSTRU U-SHP 10X18 (DRAPES) ×3 IMPLANT
DRAPE PROXIMA HALF (DRAPES) IMPLANT
DRAPE SURG 17X23 STRL (DRAPES) ×6 IMPLANT
DRSG OPSITE POSTOP 4X6 (GAUZE/BANDAGES/DRESSINGS) ×2 IMPLANT
DURAPREP 26ML APPLICATOR (WOUND CARE) ×3 IMPLANT
ELECT REM PT RETURN 9FT ADLT (ELECTROSURGICAL) ×3
ELECTRODE REM PT RTRN 9FT ADLT (ELECTROSURGICAL) ×1 IMPLANT
GAUZE SPONGE 4X4 12PLY STRL (GAUZE/BANDAGES/DRESSINGS) ×3 IMPLANT
GAUZE SPONGE 4X4 16PLY XRAY LF (GAUZE/BANDAGES/DRESSINGS) IMPLANT
GLOVE ECLIPSE 7.5 STRL STRAW (GLOVE) ×6 IMPLANT
GLOVE ECLIPSE 8.0 STRL XLNG CF (GLOVE) ×2 IMPLANT
GLOVE ECLIPSE 9.0 STRL (GLOVE) ×3 IMPLANT
GLOVE EXAM NITRILE LRG STRL (GLOVE) IMPLANT
GLOVE EXAM NITRILE MD LF STRL (GLOVE) IMPLANT
GLOVE EXAM NITRILE XL STR (GLOVE) IMPLANT
GLOVE EXAM NITRILE XS STR PU (GLOVE) IMPLANT
GLOVE INDICATOR 8.0 STRL GRN (GLOVE) ×2 IMPLANT
GLOVE INDICATOR 8.5 STRL (GLOVE) ×2 IMPLANT
GOWN STRL REUS W/ TWL LRG LVL3 (GOWN DISPOSABLE) IMPLANT
GOWN STRL REUS W/ TWL XL LVL3 (GOWN DISPOSABLE) ×1 IMPLANT
GOWN STRL REUS W/TWL 2XL LVL3 (GOWN DISPOSABLE) ×4 IMPLANT
GOWN STRL REUS W/TWL LRG LVL3 (GOWN DISPOSABLE)
GOWN STRL REUS W/TWL XL LVL3 (GOWN DISPOSABLE) ×3
KIT BASIN OR (CUSTOM PROCEDURE TRAY) ×3 IMPLANT
KIT ROOM TURNOVER OR (KITS) ×3 IMPLANT
LIQUID BAND (GAUZE/BANDAGES/DRESSINGS) ×3 IMPLANT
NDL SPNL 22GX3.5 QUINCKE BK (NEEDLE) ×1 IMPLANT
NEEDLE HYPO 22GX1.5 SAFETY (NEEDLE) ×3 IMPLANT
NEEDLE SPNL 22GX3.5 QUINCKE BK (NEEDLE) ×3 IMPLANT
NS IRRIG 1000ML POUR BTL (IV SOLUTION) ×3 IMPLANT
PACK LAMINECTOMY NEURO (CUSTOM PROCEDURE TRAY) ×3 IMPLANT
PAD ARMBOARD 7.5X6 YLW CONV (MISCELLANEOUS) ×9 IMPLANT
RUBBERBAND STERILE (MISCELLANEOUS) ×6 IMPLANT
SPONGE SURGIFOAM ABS GEL SZ50 (HEMOSTASIS) ×3 IMPLANT
STRIP CLOSURE SKIN 1/2X4 (GAUZE/BANDAGES/DRESSINGS) ×2 IMPLANT
SUT VIC AB 2-0 CT1 18 (SUTURE) ×5 IMPLANT
SUT VIC AB 3-0 SH 8-18 (SUTURE) ×3 IMPLANT
SYR 20ML ECCENTRIC (SYRINGE) ×3 IMPLANT
TOWEL OR 17X24 6PK STRL BLUE (TOWEL DISPOSABLE) ×3 IMPLANT
TOWEL OR 17X26 10 PK STRL BLUE (TOWEL DISPOSABLE) ×3 IMPLANT
WATER STERILE IRR 1000ML POUR (IV SOLUTION) ×3 IMPLANT

## 2015-01-17 NOTE — Discharge Summary (Signed)
Physician Discharge Summary  Patient ID: Angela Keith MRN: [id number] DOB/AGE: Aug 04, 1929 81 y.o.  Admit date: 01/17/2015 Discharge date: 01/17/2015  Admission Diagnoses:  Discharge Diagnoses:  Principal Problem:   Spinal stenosis, lumbar region, with neurogenic claudication Active Problems:   Lumbar stenosis   Discharged Condition: good  Hospital Course: The patient was admitted to the hospital where she underwent uncomplicated lumbar decompressive laminectomy and foraminotomies. Postop she's doing very well. Preoperative back and leg pain and weakness are resolved. She is standing and ambulating. Her pain is well-controlled. Patient desires discharge home tonight.  Consults:   Significant Diagnostic Studies:   Treatments:   Discharge Exam: Blood pressure 150/69, pulse 54, temperature 98 F (36.7 C), temperature source Oral, resp. rate 16, height 5\' 8"  (1.727 m), weight 71.215 kg (157 lb), SpO2 94 %. Awake and alert. Oriented and appropriate. Motor and sensory function intact. Wound clean and dry. Chest and abdomen benign.  Disposition: 01-Home or Self Care     Medication List    TAKE these medications        amLODipine 5 MG tablet  Commonly known as:  NORVASC  Take 2.5-5 mg by mouth daily. On Mon, Wed, Fri. Take 2.5 mg if BP <145/90     Bilberry 1000 MG Caps  Take 100 mg by mouth daily.     calcium carbonate 600 MG Tabs tablet  Commonly known as:  OS-CAL  Take 600 mg by mouth daily.     clopidogrel 75 MG tablet  Commonly known as:  PLAVIX  Take 1 tablet (75 mg total) by mouth daily.     CoQ10 200 MG Caps  Take 200 mg by mouth daily.     dorzolamide-timolol 22.3-6.8 MG/ML ophthalmic solution  Commonly known as:  COSOPT  Place 1 drop into both eyes 2 (two) times daily.     HYDROcodone-acetaminophen 5-325 MG per tablet  Commonly known as:  NORCO/VICODIN  Take 1-2 tablets by mouth every 4 (four) hours as needed (mild pain).     ICAPS PO  Take by mouth  daily.     latanoprost 0.005 % ophthalmic solution  Commonly known as:  XALATAN  Place 1 drop into both eyes at bedtime.     levothyroxine 88 MCG tablet  Commonly known as:  SYNTHROID, LEVOTHROID  Take 88 mcg by mouth daily before breakfast.     losartan-hydrochlorothiazide 50-12.5 MG per tablet  Commonly known as:  HYZAAR  Take 1 tablet by mouth daily.     metoprolol tartrate 25 MG tablet  Commonly known as:  LOPRESSOR  Take 25 mg by mouth 2 (two) times daily.     MOVE FREE PO  Take 1 tablet by mouth 2 (two) times daily.     nitroGLYCERIN 0.4 MG SL tablet  Commonly known as:  NITROSTAT  Place 0.4 mg under the tongue as directed.     pantoprazole 40 MG tablet  Commonly known as:  PROTONIX  Take 40 mg by mouth 2 (two) times daily.     pravastatin 40 MG tablet  Commonly known as:  PRAVACHOL  Take 1 tablet (40 mg total) by mouth daily at 6 PM.           Follow-up Information    Follow up with Temple PaciniPOOL,Johnnetta Holstine A, MD.   Specialty:  Neurosurgery   Contact information:   [address] [place] [address] [phone]       Signed: Temple PaciniOOL,Kalijah Westfall A 01/17/2015, 1:59 PM

## 2015-01-17 NOTE — Discharge Instructions (Signed)

## 2015-01-17 NOTE — Progress Notes (Signed)
Pt and family given D/C instructions with Rx, verbal understanding was provided. Pt's incision is clean and dry with no sign of infection. Pt's IV was removed prior to D/C. Pt D/C'd home via wheelchair @ 1510 per MD order. Pt is stable @ D/C and has no other needs at this time. Rema FendtAshley Larkyn Greenberger, RN

## 2015-01-17 NOTE — Transfer of Care (Signed)
Immediate Anesthesia Transfer of Care Note  Patient: Angela PavlovRachel E Keith  Procedure(s) Performed: Procedure(s) with comments: Laminectomy and Foraminotomy - L2-L3 - L3-L4 (N/A) - Laminectomy and Foraminotomy - L2-L3 - L3-L4  Patient Location: PACU  Anesthesia Type:General  Level of Consciousness: awake, sedated and patient cooperative  Airway & Oxygen Therapy: Patient Spontanous Breathing and Patient connected to face mask oxygen  Post-op Assessment: Report given to RN and Post -op Vital signs reviewed and stable  Post vital signs: Reviewed  Last Vitals:  Filed Vitals:   01/17/15 0621  BP: 172/85  Pulse: 63  Temp: 36.6 C  Resp: 18    Complications: No apparent anesthesia complications

## 2015-01-17 NOTE — Anesthesia Postprocedure Evaluation (Signed)
  Anesthesia Post-op Note  Patient: Angela Keith  Procedure(s) Performed: Procedure(s) with comments: Laminectomy and Foraminotomy - L2-L3 - L3-L4 (N/A) - Laminectomy and Foraminotomy - L2-L3 - L3-L4  Patient Location: PACU  Anesthesia Type:General  Level of Consciousness: awake, alert , oriented and patient cooperative  Airway and Oxygen Therapy: Patient Spontanous Breathing  Post-op Pain: none  Post-op Assessment: Post-op Vital signs reviewed, Patient's Cardiovascular Status Stable, Respiratory Function Stable, Patent Airway, No signs of Nausea or vomiting and Pain level controlled              Post-op Vital Signs: stable  Last Vitals:  Filed Vitals:   01/17/15 1109  BP: 161/94  Pulse: 73  Temp:   Resp: 16    Complications: No apparent anesthesia complications

## 2015-01-17 NOTE — H&P (Signed)
Angela PavlovRachel E Torre is an 81 y.o. female.   Chief Complaint: Bilateral leg pain and weakness HPI: 81 year old female with progressive bilateral lower extremity pain and weakness consistent with neurogenic claudication and failing conservative management. Workup demonstrates evidence of severe scoliosis with stenosis at L2-3 and L3-4. Patient is status post previous decompression and fusion surgery at L4-5. Patient's failed conservative management. She presents now for decompressive surgery at L2-3 and L3-4.  Past Medical History  Diagnosis Date  . Hypothyroidism   . Arthritis   . GERD (gastroesophageal reflux disease)   . Dysphagia   . Hyperlipidemia   . Right bundle branch block 03/21/2014  . HTN (hypertension) 10/30/2012  . Unspecified hypothyroidism 10/30/2012  . Severe hearing loss   . DJD (degenerative joint disease), lumbar   . Rotoscoliosis   . Glaucoma   . DJD (degenerative joint disease), cervical   . Chronic neck pain   . Nocturia   . Insomnia   . Stroke   . Shortness of breath dyspnea     Past Surgical History  Procedure Laterality Date  . Breast surgery    . Back surgery      lower back   . Esophagogastroduodenoscopy (egd) with esophageal dilation  06/08/2012    Procedure: ESOPHAGOGASTRODUODENOSCOPY (EGD) WITH ESOPHAGEAL DILATION;  Surgeon: Charolett BumpersMartin K Johnson, MD;  Location: [facility];  Service: Endoscopy;  Laterality: N/A;  . Bladder laser surgery    . Left carpal tunnel surgery    . Facail surgery  2000    facelift and eyelid lift  . Cataract surgery      Family History  Problem Relation Age of Onset  . CAD Father   . Stroke Father   . CAD Brother   . Diabetes Mother   . Lung cancer Other    Social History:  reports that she has never smoked. She does not have any smokeless tobacco history on file. She reports that she does not drink alcohol or use illicit drugs.  Allergies:  Allergies  Allergen Reactions  . Ace Inhibitors Other (See Comments)    Severe  fatigue  . Lipitor [Atorvastatin] Other (See Comments)    Mood swings  . Motrin [Ibuprofen] Nausea And Vomiting    Medications Prior to Admission  Medication Sig Dispense Refill  . amLODipine (NORVASC) 5 MG tablet Take 2.5-5 mg by mouth daily. On Mon, Wed, Fri. Take 2.5 mg if BP <145/90    . Bilberry 1000 MG CAPS Take 100 mg by mouth daily.    . calcium carbonate (OS-CAL) 600 MG TABS Take 600 mg by mouth daily.    . clopidogrel (PLAVIX) 75 MG tablet Take 1 tablet (75 mg total) by mouth daily. 30 tablet 2  . Coenzyme Q10 (COQ10) 200 MG CAPS Take 200 mg by mouth daily.    . dorzolamide-timolol (COSOPT) 22.3-6.8 MG/ML ophthalmic solution Place 1 drop into both eyes 2 (two) times daily.    . Glucosamine-Chondroitin (MOVE FREE PO) Take 1 tablet by mouth 2 (two) times daily.    Marland Kitchen. latanoprost (XALATAN) 0.005 % ophthalmic solution Place 1 drop into both eyes at bedtime.    Marland Kitchen. levothyroxine (SYNTHROID, LEVOTHROID) 88 MCG tablet Take 88 mcg by mouth daily before breakfast.    . losartan-hydrochlorothiazide (HYZAAR) 50-12.5 MG per tablet Take 1 tablet by mouth daily.    . metoprolol tartrate (LOPRESSOR) 25 MG tablet Take 25 mg by mouth 2 (two) times daily.    . Multiple Vitamins-Minerals (ICAPS PO) Take by mouth daily.    .Marland Kitchen  nitroGLYCERIN (NITROSTAT) 0.4 MG SL tablet Place 0.4 mg under the tongue as directed.    . pantoprazole (PROTONIX) 40 MG tablet Take 40 mg by mouth 2 (two) times daily.    . pravastatin (PRAVACHOL) 40 MG tablet Take 1 tablet (40 mg total) by mouth daily at 6 PM. 30 tablet 2    No results found for this or any previous visit (from the past 48 hour(s)). No results found.  A comprehensive review of systems was negative.  Blood pressure 172/85, pulse 63, temperature 97.8 F (36.6 C), temperature source Oral, resp. rate 18, height  (1.727 m), weight 71.215 kg (157 lb), SpO2 97 %.  Patient is awake and alert. She is oriented and appropriate. Cranial nerve function is intact.  Speech is fluent. Judgment and insight are intact. Motor and sensory examination extremities are grossly normal. Reflexes are hypoactive but symmetric. There is no evidence of long track signs. Gait is slow and somewhat steppage. She has a moderately flexed posture. Examination head ears eyes don't is unremarkable. Chest and abdomen are benign. Extremities are free from injury or deformity area Assessment/Plan Severe spinal stenosis at L2-3 and L3-4 with neurogenic claudication. Plan L2-3 L3-4 decompressive laminectomy with foraminotomies. Risks and benefits of been explained. Patient wishes to proceed.  POOL,HENRY A 01/17/2015, 7:42 AM

## 2015-01-17 NOTE — Brief Op Note (Signed)
01/17/2015  9:40 AM  PATIENT:  Angela Keith  81 y.o. female  PRE-OPERATIVE DIAGNOSIS:  Stenosis  POST-OPERATIVE DIAGNOSIS:  Stenosis  PROCEDURE:  Procedure(s) with comments: Laminectomy and Foraminotomy - L2-L3 - L3-L4 (N/A) - Laminectomy and Foraminotomy - L2-L3 - L3-L4  SURGEON:  Surgeon(s) and Role:    * Julio SicksHenry Kiearra Oyervides, MD - Primary    * Barnett AbuHenry Elsner, MD - Assisting  PHYSICIAN ASSISTANT:   ASSISTANTS:    ANESTHESIA:   general  EBL:  Total I/O In: 1500 [I.V.:1500] Out: 200 [Blood:200]  BLOOD ADMINISTERED:none  DRAINS: none   LOCAL MEDICATIONS USED:  MARCAINE     SPECIMEN:  No Specimen  DISPOSITION OF SPECIMEN:  N/A  COUNTS:  YES  TOURNIQUET:  * No tourniquets in log *  DICTATION: .Dragon Dictation  PLAN OF CARE: Admit for overnight observation  PATIENT DISPOSITION:  PACU - hemodynamically stable.   Delay start of Pharmacological VTE agent (>24hrs) due to surgical blood loss or risk of bleeding: yes

## 2015-01-17 NOTE — Op Note (Signed)
Date of procedure: 01/17/2015  Date of dictation: Same  Service: Neurosurgery  Preoperative diagnosis: L2-3, L3-4 stenosis with neurogenic claudication  Postoperative diagnosis: Same  Procedure Name: L2-3, L3-4 decompressive laminectomies with bilateral L2, L3, L4 decompressive foraminotomies  Surgeon:Laurelai Lepp A.Melizza Kanode, M.D.  Asst. Surgeon: Danielle DessElsner  Anesthesia: General  Indication: 81 year old female status post previous L4-5 decompression infusion presents with worsening back and bilateral lower extremity pain left much greater than right. Workup demonstrates evidence of progressive disc degeneration, collapse and scoliotic deformity with severe foraminal stenosis at multiple levels and severe central stenosis at L2-3 and L3-4. Patient is a poor candidate for extension of her fusion. We've decided proceed with simple decompressive surgery in hopes of improving her symptoms.  Operative note: After induction of anesthesia, patient position prone onto Wilson frame and a properly padded. Lumbar region prepped and draped. Incision made overlying L2-3 4. Dissection performed. Retractor placed. X-ray taken. Level confirmed. Decompressive laminectomies then performed using high-speed drill and Kerrison rongeurs to remove the entire lamina of L2 the entire lamina of L3 and the medial aspect the L2-3 and L3-4 facet joints. Ligament flavum and epidural scar were elevated and resected. Decompressive foraminotomies were performed by undercutting the lateral gutters using Kerrison rongeurs. The L2-L3 and L4 nerve roots were all identified and free from any obvious compression. On the left at L2-3 there was a foraminal synovial cyst which was resected. There was no evidence of injury to thecal sac or nerve roots. Wound is then irrigated with and bike solution. Gelfoam was placed topically for hemostasis. Vancomycin powder was left in the deep wound space. Wounds and close in layers. Steri-Strips and sterile dressing  were applied. There were no apparent complications. Patient tolerated the procedure well and she returns to the recovery room postop.

## 2015-01-17 NOTE — Anesthesia Preprocedure Evaluation (Addendum)
Anesthesia Evaluation  Patient identified by MRN, date of birth, ID band Patient awake    Reviewed: Allergy & Precautions, NPO status , Patient's Chart, lab work & pertinent test results  Airway Mallampati: I  TM Distance: >3 FB Neck ROM: Full    Dental  (+) Partial Upper, Partial Lower, Dental Advisory Given   Pulmonary  breath sounds clear to auscultation  Pulmonary exam normal       Cardiovascular hypertension, Normal cardiovascular exam+ dysrhythmias     Neuro/Psych TIA Neuromuscular disease    GI/Hepatic GERD-  ,  Endo/Other  Hypothyroidism   Renal/GU      Musculoskeletal  (+) Arthritis -,   Abdominal   Peds  Hematology   Anesthesia Other Findings   Reproductive/Obstetrics                            Anesthesia Physical Anesthesia Plan  ASA: II  Anesthesia Plan: General   Post-op Pain Management:    Induction: Intravenous  Airway Management Planned: Oral ETT  Additional Equipment:   Intra-op Plan:   Post-operative Plan: Extubation in OR  Informed Consent: I have reviewed the patients History and Physical, chart, labs and discussed the procedure including the risks, benefits and alternatives for the proposed anesthesia with the patient or authorized representative who has indicated his/her understanding and acceptance.     Plan Discussed with: CRNA, Anesthesiologist and Surgeon  Anesthesia Plan Comments:         Anesthesia Quick Evaluation

## 2015-01-17 NOTE — Anesthesia Procedure Notes (Signed)
Procedure Name: Intubation Date/Time: 01/17/2015 7:56 AM Performed by: Jenne Campus Pre-anesthesia Checklist: Patient identified, Emergency Drugs available, Suction available, Patient being monitored and Timeout performed Patient Re-evaluated:Patient Re-evaluated prior to inductionOxygen Delivery Method: Circle system utilized Preoxygenation: Pre-oxygenation with 100% oxygen Intubation Type: IV induction Ventilation: Mask ventilation without difficulty Laryngoscope Size: Miller and 2 Grade View: Grade I Tube type: Oral Tube size: 7.0 mm Number of attempts: 1 Airway Equipment and Method: Stylet and LTA kit utilized Placement Confirmation: ETT inserted through vocal cords under direct vision,  positive ETCO2,  CO2 detector and breath sounds checked- equal and bilateral Secured at: 22 cm Tube secured with: Tape Dental Injury: Teeth and Oropharynx as per pre-operative assessment

## 2015-01-17 NOTE — Progress Notes (Signed)
pts teeth and hearing aids returned

## 2015-01-18 ENCOUNTER — Other Ambulatory Visit: Payer: Self-pay

## 2015-01-18 ENCOUNTER — Emergency Department (HOSPITAL_COMMUNITY): Payer: Medicare Other

## 2015-01-18 ENCOUNTER — Encounter (HOSPITAL_COMMUNITY): Payer: Self-pay

## 2015-01-18 ENCOUNTER — Inpatient Hospital Stay (HOSPITAL_COMMUNITY)
Admission: EM | Admit: 2015-01-18 | Discharge: 2015-01-20 | DRG: 040 | Disposition: A | Discharge status: Rehabilitation Facility | Payer: Medicare Other | Attending: Internal Medicine | Admitting: Internal Medicine

## 2015-01-18 ENCOUNTER — Observation Stay (HOSPITAL_COMMUNITY): Payer: Medicare Other

## 2015-01-18 DIAGNOSIS — Z8673 Personal history of transient ischemic attack (TIA), and cerebral infarction without residual deficits: Secondary | ICD-10-CM | POA: Insufficient documentation

## 2015-01-18 DIAGNOSIS — M5136 Other intervertebral disc degeneration, lumbar region: Secondary | ICD-10-CM | POA: Diagnosis present

## 2015-01-18 DIAGNOSIS — R29810 Facial weakness: Secondary | ICD-10-CM | POA: Diagnosis not present

## 2015-01-18 DIAGNOSIS — K219 Gastro-esophageal reflux disease without esophagitis: Secondary | ICD-10-CM | POA: Diagnosis present

## 2015-01-18 DIAGNOSIS — G9519 Other vascular myelopathies: Secondary | ICD-10-CM | POA: Diagnosis not present

## 2015-01-18 DIAGNOSIS — I639 Cerebral infarction, unspecified: Secondary | ICD-10-CM | POA: Diagnosis present

## 2015-01-18 DIAGNOSIS — G8929 Other chronic pain: Secondary | ICD-10-CM | POA: Diagnosis present

## 2015-01-18 DIAGNOSIS — R51 Headache: Secondary | ICD-10-CM | POA: Diagnosis not present

## 2015-01-18 DIAGNOSIS — Z7901 Long term (current) use of anticoagulants: Secondary | ICD-10-CM | POA: Diagnosis not present

## 2015-01-18 DIAGNOSIS — G8194 Hemiplegia, unspecified affecting left nondominant side: Secondary | ICD-10-CM | POA: Diagnosis not present

## 2015-01-18 DIAGNOSIS — I6611 Occlusion and stenosis of right anterior cerebral artery: Secondary | ICD-10-CM | POA: Diagnosis not present

## 2015-01-18 DIAGNOSIS — G253 Myoclonus: Secondary | ICD-10-CM | POA: Diagnosis not present

## 2015-01-18 DIAGNOSIS — I6789 Other cerebrovascular disease: Secondary | ICD-10-CM | POA: Diagnosis not present

## 2015-01-18 DIAGNOSIS — G451 Carotid artery syndrome (hemispheric): Secondary | ICD-10-CM | POA: Diagnosis present

## 2015-01-18 DIAGNOSIS — E785 Hyperlipidemia, unspecified: Secondary | ICD-10-CM | POA: Diagnosis present

## 2015-01-18 DIAGNOSIS — M549 Dorsalgia, unspecified: Secondary | ICD-10-CM

## 2015-01-18 DIAGNOSIS — H9190 Unspecified hearing loss, unspecified ear: Secondary | ICD-10-CM | POA: Diagnosis present

## 2015-01-18 DIAGNOSIS — R200 Anesthesia of skin: Secondary | ICD-10-CM

## 2015-01-18 DIAGNOSIS — M48061 Spinal stenosis, lumbar region without neurogenic claudication: Secondary | ICD-10-CM | POA: Insufficient documentation

## 2015-01-18 DIAGNOSIS — M4806 Spinal stenosis, lumbar region: Secondary | ICD-10-CM | POA: Diagnosis not present

## 2015-01-18 DIAGNOSIS — I10 Essential (primary) hypertension: Secondary | ICD-10-CM | POA: Diagnosis present

## 2015-01-18 DIAGNOSIS — Z888 Allergy status to other drugs, medicaments and biological substances status: Secondary | ICD-10-CM | POA: Diagnosis not present

## 2015-01-18 DIAGNOSIS — R609 Edema, unspecified: Secondary | ICD-10-CM | POA: Diagnosis not present

## 2015-01-18 DIAGNOSIS — M6289 Other specified disorders of muscle: Secondary | ICD-10-CM | POA: Diagnosis not present

## 2015-01-18 DIAGNOSIS — R351 Nocturia: Secondary | ICD-10-CM | POA: Diagnosis present

## 2015-01-18 DIAGNOSIS — G9782 Other postprocedural complications and disorders of nervous system: Secondary | ICD-10-CM

## 2015-01-18 DIAGNOSIS — I4510 Unspecified right bundle-branch block: Secondary | ICD-10-CM | POA: Diagnosis present

## 2015-01-18 DIAGNOSIS — Z79899 Other long term (current) drug therapy: Secondary | ICD-10-CM | POA: Diagnosis not present

## 2015-01-18 DIAGNOSIS — R259 Unspecified abnormal involuntary movements: Secondary | ICD-10-CM | POA: Diagnosis not present

## 2015-01-18 DIAGNOSIS — M47892 Other spondylosis, cervical region: Secondary | ICD-10-CM | POA: Diagnosis present

## 2015-01-18 DIAGNOSIS — I6330 Cerebral infarction due to thrombosis of unspecified cerebral artery: Secondary | ICD-10-CM | POA: Diagnosis not present

## 2015-01-18 DIAGNOSIS — R0602 Shortness of breath: Secondary | ICD-10-CM | POA: Diagnosis not present

## 2015-01-18 DIAGNOSIS — M7138 Other bursal cyst, other site: Secondary | ICD-10-CM | POA: Diagnosis present

## 2015-01-18 DIAGNOSIS — R531 Weakness: Secondary | ICD-10-CM | POA: Diagnosis present

## 2015-01-18 DIAGNOSIS — R05 Cough: Secondary | ICD-10-CM | POA: Diagnosis not present

## 2015-01-18 DIAGNOSIS — I63421 Cerebral infarction due to embolism of right anterior cerebral artery: Secondary | ICD-10-CM | POA: Diagnosis not present

## 2015-01-18 DIAGNOSIS — G8190 Hemiplegia, unspecified affecting unspecified side: Secondary | ICD-10-CM | POA: Diagnosis not present

## 2015-01-18 DIAGNOSIS — E039 Hypothyroidism, unspecified: Secondary | ICD-10-CM | POA: Diagnosis present

## 2015-01-18 DIAGNOSIS — H409 Unspecified glaucoma: Secondary | ICD-10-CM | POA: Diagnosis present

## 2015-01-18 DIAGNOSIS — G459 Transient cerebral ischemic attack, unspecified: Secondary | ICD-10-CM | POA: Diagnosis present

## 2015-01-18 LAB — DIFFERENTIAL
BASOS ABS: 0 10*3/uL (ref 0.0–0.1)
Basophils Relative: 0 % (ref 0–1)
Eosinophils Absolute: 0 10*3/uL (ref 0.0–0.7)
Eosinophils Relative: 0 % (ref 0–5)
LYMPHS PCT: 15 % (ref 12–46)
Lymphs Abs: 1.7 10*3/uL (ref 0.7–4.0)
Monocytes Absolute: 1.4 10*3/uL — ABNORMAL HIGH (ref 0.1–1.0)
Monocytes Relative: 13 % — ABNORMAL HIGH (ref 3–12)
NEUTROS ABS: 7.9 10*3/uL — AB (ref 1.7–7.7)
NEUTROS PCT: 72 % (ref 43–77)

## 2015-01-18 LAB — APTT: aPTT: 26 seconds (ref 24–37)

## 2015-01-18 LAB — COMPREHENSIVE METABOLIC PANEL
ALBUMIN: 3.8 g/dL (ref 3.5–5.0)
ALK PHOS: 47 U/L (ref 38–126)
ALT: 17 U/L (ref 14–54)
ANION GAP: 8 (ref 5–15)
AST: 27 U/L (ref 15–41)
BUN: 14 mg/dL (ref 6–20)
CALCIUM: 9.6 mg/dL (ref 8.9–10.3)
CO2: 27 mmol/L (ref 22–32)
Chloride: 99 mmol/L — ABNORMAL LOW (ref 101–111)
Creatinine, Ser: 1.06 mg/dL — ABNORMAL HIGH (ref 0.44–1.00)
GFR calc non Af Amer: 48 mL/min — ABNORMAL LOW (ref >60)
GFR, EST AFRICAN AMERICAN: 55 mL/min — AB (ref >60)
Glucose, Bld: 118 mg/dL — ABNORMAL HIGH (ref 65–99)
POTASSIUM: 3.9 mmol/L (ref 3.5–5.1)
Sodium: 134 mmol/L — ABNORMAL LOW (ref 135–145)
Total Bilirubin: 0.7 mg/dL (ref 0.3–1.2)
Total Protein: 6.7 g/dL (ref 6.5–8.1)

## 2015-01-18 LAB — CBC
HCT: 34.8 % — ABNORMAL LOW (ref 36.0–46.0)
Hemoglobin: 11.5 g/dL — ABNORMAL LOW (ref 12.0–15.0)
MCH: 28.2 pg (ref 26.0–34.0)
MCHC: 33 g/dL (ref 30.0–36.0)
MCV: 85.3 fL (ref 78.0–100.0)
Platelets: 224 10*3/uL (ref 150–400)
RBC: 4.08 MIL/uL (ref 3.87–5.11)
RDW: 13.9 % (ref 11.5–15.5)
WBC: 10.9 10*3/uL — ABNORMAL HIGH (ref 4.0–10.5)

## 2015-01-18 LAB — URINALYSIS, ROUTINE W REFLEX MICROSCOPIC
Bilirubin Urine: NEGATIVE
Glucose, UA: NEGATIVE mg/dL
HGB URINE DIPSTICK: NEGATIVE
Ketones, ur: NEGATIVE mg/dL
Leukocytes, UA: NEGATIVE
NITRITE: NEGATIVE
Protein, ur: NEGATIVE mg/dL
Specific Gravity, Urine: 1.009 (ref 1.005–1.030)
Urobilinogen, UA: 0.2 mg/dL (ref 0.0–1.0)
pH: 7 (ref 5.0–8.0)

## 2015-01-18 LAB — I-STAT CHEM 8, ED
BUN: 16 mg/dL (ref 6–20)
CREATININE: 1 mg/dL (ref 0.44–1.00)
Calcium, Ion: 1.23 mmol/L (ref 1.13–1.30)
Chloride: 98 mmol/L — ABNORMAL LOW (ref 101–111)
Glucose, Bld: 115 mg/dL — ABNORMAL HIGH (ref 65–99)
HCT: 39 % (ref 36.0–46.0)
HEMOGLOBIN: 13.3 g/dL (ref 12.0–15.0)
Potassium: 3.8 mmol/L (ref 3.5–5.1)
Sodium: 136 mmol/L (ref 135–145)
TCO2: 23 mmol/L (ref 0–100)

## 2015-01-18 LAB — I-STAT TROPONIN, ED: TROPONIN I, POC: 0.01 ng/mL (ref 0.00–0.08)

## 2015-01-18 LAB — PROTIME-INR
INR: 1.06 (ref 0.00–1.49)
Prothrombin Time: 14 seconds (ref 11.6–15.2)

## 2015-01-18 MED ORDER — CLOPIDOGREL BISULFATE 75 MG PO TABS
75.0000 mg | ORAL_TABLET | Freq: Every day | ORAL | Status: DC
  Administered 2015-01-18 – 2015-01-20 (×2): 75 mg via ORAL
  Filled 2015-01-18 (×2): qty 1

## 2015-01-18 MED ORDER — AMLODIPINE BESYLATE 5 MG PO TABS
5.0000 mg | ORAL_TABLET | Freq: Every day | ORAL | Status: DC
  Administered 2015-01-18: 5 mg via ORAL
  Filled 2015-01-18: qty 1

## 2015-01-18 MED ORDER — PANTOPRAZOLE SODIUM 40 MG PO TBEC
40.0000 mg | DELAYED_RELEASE_TABLET | Freq: Two times a day (BID) | ORAL | Status: DC
  Administered 2015-01-18 – 2015-01-20 (×4): 40 mg via ORAL
  Filled 2015-01-18 (×4): qty 1

## 2015-01-18 MED ORDER — METHOCARBAMOL 500 MG PO TABS
500.0000 mg | ORAL_TABLET | Freq: Two times a day (BID) | ORAL | Status: DC | PRN
  Administered 2015-01-18 – 2015-01-20 (×4): 500 mg via ORAL
  Filled 2015-01-18 (×5): qty 1

## 2015-01-18 MED ORDER — DORZOLAMIDE HCL-TIMOLOL MAL 2-0.5 % OP SOLN
1.0000 [drp] | Freq: Two times a day (BID) | OPHTHALMIC | Status: DC
  Administered 2015-01-18 – 2015-01-20 (×5): 1 [drp] via OPHTHALMIC
  Filled 2015-01-18: qty 10

## 2015-01-18 MED ORDER — HYDROCODONE-ACETAMINOPHEN 5-325 MG PO TABS
0.5000 | ORAL_TABLET | ORAL | Status: DC | PRN
  Administered 2015-01-18 – 2015-01-19 (×3): 1 via ORAL
  Filled 2015-01-18 (×3): qty 1

## 2015-01-18 MED ORDER — STROKE: EARLY STAGES OF RECOVERY BOOK
Freq: Once | Status: DC
  Filled 2015-01-18: qty 1

## 2015-01-18 MED ORDER — METOPROLOL TARTRATE 25 MG PO TABS
25.0000 mg | ORAL_TABLET | Freq: Two times a day (BID) | ORAL | Status: DC
  Administered 2015-01-18 – 2015-01-20 (×2): 25 mg via ORAL
  Filled 2015-01-18 (×4): qty 1

## 2015-01-18 MED ORDER — LOSARTAN POTASSIUM-HCTZ 50-12.5 MG PO TABS: 1.0000 | ORAL_TABLET | Freq: Every day | ORAL | Status: DC

## 2015-01-18 MED ORDER — LATANOPROST 0.005 % OP SOLN
1.0000 [drp] | Freq: Every day | OPHTHALMIC | Status: DC
  Administered 2015-01-18 – 2015-01-19 (×2): 1 [drp] via OPHTHALMIC
  Filled 2015-01-18: qty 2.5

## 2015-01-18 MED ORDER — METHOCARBAMOL 1000 MG/10ML IJ SOLN
500.0000 mg | Freq: Once | INTRAVENOUS | Status: AC
  Administered 2015-01-18: 500 mg via INTRAVENOUS
  Filled 2015-01-18: qty 5

## 2015-01-18 MED ORDER — LEVOTHYROXINE SODIUM 88 MCG PO TABS
88.0000 ug | ORAL_TABLET | Freq: Every day | ORAL | Status: DC
  Administered 2015-01-18 – 2015-01-20 (×2): 88 ug via ORAL
  Filled 2015-01-18 (×4): qty 1

## 2015-01-18 MED ORDER — HYDROCHLOROTHIAZIDE 12.5 MG PO CAPS
12.5000 mg | ORAL_CAPSULE | Freq: Every day | ORAL | Status: DC
  Administered 2015-01-18: 12.5 mg via ORAL
  Filled 2015-01-18: qty 1

## 2015-01-18 MED ORDER — PRAVASTATIN SODIUM 40 MG PO TABS
40.0000 mg | ORAL_TABLET | Freq: Every day | ORAL | Status: DC
  Administered 2015-01-18 – 2015-01-19 (×2): 40 mg via ORAL
  Filled 2015-01-18 (×3): qty 1

## 2015-01-18 MED ORDER — LOSARTAN POTASSIUM 50 MG PO TABS
50.0000 mg | ORAL_TABLET | Freq: Every day | ORAL | Status: DC
  Administered 2015-01-18: 50 mg via ORAL
  Filled 2015-01-18 (×2): qty 1

## 2015-01-18 MED ORDER — COQ10 200 MG PO CAPS: 200.0000 mg | ORAL_CAPSULE | Freq: Every day | ORAL | Status: DC

## 2015-01-18 NOTE — ED Notes (Addendum)
Dr. Nicanor AlconPalumbo at the bedside. DIL stating she had a little trouble voiding after the procedure yesterday and she was able to void but then they did an in and out cath.

## 2015-01-18 NOTE — Progress Notes (Signed)
The patient was admitted the hospital with an acute right anterior cerebral artery infarct. She has significant left-sided weakness. Prior to the onset of the symptoms she had been home doing well.  It is fine from my standpoint to restart her Plavix. I'm afraid I don't have any other options to add. It is fine for her to be mobilized ad lib. I will follow with you.

## 2015-01-18 NOTE — Evaluation (Addendum)
Clinical/Bedside Swallow Evaluation Patient Details  Name: Angela Keith MRN: [id number] Date of Birth: 05-Sep-1929  Today's Date: 01/18/2015 Time: SLP Start Time (ACUTE ONLY): 1120 SLP Stop Time (ACUTE ONLY): 1159 SLP Time Calculation (min) (ACUTE ONLY): 39 min  Past Medical History:  Past Medical History  Diagnosis Date  . Hypothyroidism   . Arthritis   . GERD (gastroesophageal reflux disease)   . Dysphagia   . Hyperlipidemia   . Right bundle branch block 03/21/2014  . HTN (hypertension) 10/30/2012  . Unspecified hypothyroidism 10/30/2012  . Severe hearing loss   . DJD (degenerative joint disease), lumbar   . Rotoscoliosis   . Glaucoma   . DJD (degenerative joint disease), cervical   . Chronic neck pain   . Nocturia   . Insomnia   . Stroke   . Shortness of breath dyspnea    Past Surgical History:  Past Surgical History  Procedure Laterality Date  . Breast surgery    . Back surgery      lower back   . Esophagogastroduodenoscopy (egd) with esophageal dilation  06/08/2012    Procedure: ESOPHAGOGASTRODUODENOSCOPY (EGD) WITH ESOPHAGEAL DILATION;  Surgeon: Charolett Bumpers, MD;  Location: [facility];  Service: Endoscopy;  Laterality: N/A;  . Bladder laser surgery    . Left carpal tunnel surgery    . Facail surgery  2000    facelift and eyelid lift  . Cataract surgery     HPI:  Angela MAYO is a 81 y.o. female with PMH significant for HTN, HLD, presumed CVA in past which is s/p TPA but with negative MRI.  Patient underwent uncomplicated lumbar decompressive laminectomy with Dr. Jordan Likes yesterday.  After going home in the afternoon she developed intermittent episodes of L sided weakness and facial droop.  Weakness involves both upper and lower extremities.  Pt had developed increased tone, decorticate posturing, and facial droop, all of this on the left side per MD note.  MRI revealed punctate infarct left thalamus and right ACA infarct impacting motor strip. PMH + for esophageal  dilatations (2013 and 2010).   Pt CXR showed shallow inspiration, nothing acute.    Assessment / Plan / Recommendation Clinical Impression  Pt presents with functional swallow based on clinical swallow evaluation.  No s/s of aspiration apparent. She does have h/o esophageal dilatation x2 - last being in 2013.  Pt admits to some dysphagia with intake of rice consumed at home after surgery yesterday.  She also admits to some large pill dysphagia and reports improved tolerance with pureed.  Rapid rate of intake observed - advised pt to consume po slowly for maximal airway protection.  No oral residuals noted and pt's voice was clear throughout intake.  Recommend regular/thin with genral aspiration/reflux precautions.  Reviewed information with pf/family and had pt read swallow precaution sign out loud for understanding.   Given h/o esophageal dysphagia, decreased tolerance of rice s/p intubation and current cva, will follow up x1 to assure tolerance.  Note pt with mild dysarthria - pt and family concurs.  Will see for SLE after today as ordered.     Aspiration Risk  Mild    Diet Recommendation Age appropriate regular solids;Thin   Medication Administration: Whole meds with puree (follow with liquids) Compensations: Slow rate;Small sips/bites;Follow solids with liquid    Other  Recommendations Oral Care Recommendations: Oral care BID   Follow Up Recommendations       Frequency and Duration min 1 x/week  1 week   Pertinent  Vitals/Pain Afebrile, decreased      Swallow Study Prior Functional Status   see HHX    General Date of Onset: 01/18/15 Other Pertinent Information: Angela Keith is a 81 y.o. female with PMH significant for HTN, HLD, presumed CVA in past which is s/p TPA but with negative MRI.  Patient underwent uncomplicated lumbar decompressive laminectomy with Dr. Jordan LikesPool yesterday.  After going home in the afternoon she developed intermittent episodes of L sided weakness and facial  droop.  Weakness involves both upper and lower extremities.  Pt had developed increased tone, decorticate posturing, and facial droop, all of this on the left side per MD note.  MRI revealed punctate infarct left thalamus and right ACA infarct impacting motor strip. PMH + for esophageal dilatations (2013 and 2010).   Pt CXR showed shallow inspiration, nothing acute.  Type of Study: Bedside swallow evaluation Diet Prior to this Study: NPO Temperature Spikes Noted: No Respiratory Status: Room air History of Recent Intubation: Yes (intubation for surgery) Behavior/Cognition: Alert;Cooperative;Pleasant mood;Other (Comment) (VERY HOH) Oral Cavity - Dentition: Adequate natural dentition/normal for age;Missing dentition;Other (Comment) (upper plate) Self-Feeding Abilities: Able to feed self Patient Positioning: Partially reclined (pt s/p laminectomy 7/12) Baseline Vocal Quality: Low vocal intensity Volitional Cough: Weak Volitional Swallow: Unable to elicit    Oral/Motor/Sensory Function Overall Oral Motor/Sensory Function: Impaired Labial ROM: Reduced left Lingual ROM: Reduced left Lingual Strength: Reduced Facial ROM: Reduced left Facial Symmetry: Left droop;Left drooping eyelid Velum: Within Functional Limits   Ice Chips Ice chips: Not tested   Thin Liquid Thin Liquid: Within functional limits Presentation: Cup;Straw    Nectar Thick Nectar Thick Liquid: Not tested   Honey Thick Honey Thick Liquid: Not tested   Puree Puree: Within functional limits Presentation: Self Fed;Spoon   Solid   GO    Solid: Within functional limits Presentation: Self Angela Keith      Angela Boak, MS [facility] SLP [phone] [id number]

## 2015-01-18 NOTE — Consult Note (Signed)
ELECTROPHYSIOLOGY CONSULT NOTE  Patient ID: Angela Keith MRN: [id number], DOB/AGE: 02/11/1930   Admit date: 01/18/2015 Date of Consult: 01/19/2015  Primary Physician: [name], MD Primary Cardiologist: New Reason for Consultation: Cryptogenic stroke; recommendations regarding Implantable Loop Recorder  History of Present Illness Angela Keith was admitted on 01/18/2015 with acute CVA. she has been monitored on telemetry which has demonstrated no arrhythmias. No cause has been identified. Inpatient stroke work-up is to be completed with a TEE. EP has been asked to evaluate for placement of an implantable loop recorder to monitor for atrial fibrillation.  Past Medical History Past Medical History  Diagnosis Date  . Hypothyroidism   . Arthritis   . GERD (gastroesophageal reflux disease)   . Dysphagia   . Hyperlipidemia   . Right bundle branch block 03/21/2014  . HTN (hypertension) 10/30/2012  . Unspecified hypothyroidism 10/30/2012  . Severe hearing loss   . DJD (degenerative joint disease), lumbar   . Rotoscoliosis   . Glaucoma   . DJD (degenerative joint disease), cervical   . Chronic neck pain   . Nocturia   . Insomnia   . Stroke   . Shortness of breath dyspnea     Past Surgical History Past Surgical History  Procedure Laterality Date  . Breast surgery    . Back surgery      lower back   . Esophagogastroduodenoscopy (egd) with esophageal dilation  06/08/2012    Procedure: ESOPHAGOGASTRODUODENOSCOPY (EGD) WITH ESOPHAGEAL DILATION;  Surgeon: Charolett Bumpers, MD;  Location: [facility];  Service: Endoscopy;  Laterality: N/A;  . Bladder laser surgery    . Left carpal tunnel surgery    . Facail surgery  2000    facelift and eyelid lift  . Cataract surgery    . Lumbar laminectomy/decompression microdiscectomy N/A 01/17/2015    Procedure: Laminectomy and Foraminotomy - L2-L3 - L3-L4;  Surgeon: [name], MD;  Location: [facility];  Service: Neurosurgery;   Laterality: N/A;  Laminectomy and Foraminotomy - L2-L3 - L3-L4    Allergies/Intolerances Allergies  Allergen Reactions  . Ace Inhibitors Other (See Comments)    Severe fatigue  . Lipitor [Atorvastatin] Other (See Comments)    Mood swings  . Motrin [Ibuprofen] Nausea And Vomiting   Inpatient Medications .  stroke: mapping our early stages of recovery book   Does not apply Once  . clopidogrel  75 mg Oral Daily  . dorzolamide-timolol  1 drop Both Eyes BID  . latanoprost  1 drop Both Eyes QHS  . levothyroxine  88 mcg Oral QAC breakfast  . metoprolol tartrate  25 mg Oral BID  . pantoprazole  40 mg Oral BID  . pravastatin  40 mg Oral q1800     Social History History   Social History  . Marital Status: Married    Spouse Name: widowed now  . Number of Children: N/A  . Years of Education: 10   Occupational History  . retired     Veterinary surgeon, office work   Social History Main Topics  . Smoking status: Never Smoker   . Smokeless tobacco: Not on file  . Alcohol Use: No  . Drug Use: No  . Sexual Activity: No   Other Topics Concern  . Not on file   Social History Narrative   Widowed, 2 sons   Right handed   caffeine use- none    Review of Systems General: No chills, fever, night sweats or weight changes  Cardiovascular:  No  chest pain, dyspnea on exertion, edema, orthopnea, palpitations, paroxysmal nocturnal dyspnea Dermatological: No rash, lesions or masses Respiratory: No cough, dyspnea Urologic: No hematuria, dysuria Abdominal: No nausea, vomiting, diarrhea, bright red blood per rectum, melena, or hematemesis Neurologic: No visual changes, weakness, changes in mental status All other systems reviewed and are otherwise negative except as noted above.  Physical Exam Blood pressure 130/57, pulse 75, temperature 98.6 F (37 C), temperature source Oral, resp. rate 16, SpO2 98 %.  General: Well developed, well appearing 81 y.o. female in no acute distress. HEENT:  Normocephalic, atraumatic. EOMs intact. Sclera nonicteric. Oropharynx clear.  Neck: Supple without bruits. No JVD. Lungs: Respirations regular and unlabored, CTA bilaterally. No wheezes, rales or rhonchi. Heart: RRR. S1, S2 present. No murmurs, rub, S3 or S4. Abdomen: Soft, non-tender, non-distended. BS present x 4 quadrants. No hepatosplenomegaly.  Extremities: No clubbing, cyanosis or edema. DP/PT/Radials 2+ and equal bilaterally. Psych: Normal affect. Neuro: Alert and oriented X 3. Moves all extremities spontaneously. Musculoskeletal: No kyphosis. Skin: Intact. Warm and dry. No rashes or petechiae in exposed areas.   Labs Lab Results  Component Value Date   WBC 10.9* 01/18/2015   HGB 13.3 01/18/2015   HCT 39.0 01/18/2015   MCV 85.3 01/18/2015   PLT 224 01/18/2015    Recent Labs Lab 01/18/15 0341  01/19/15 0452  NA 134*  < > 133*  K 3.9  < > 3.6  CL 99*  < > 98*  CO2 27  --  25  BUN 14  < > 11  CREATININE 1.06*  < > 0.82  CALCIUM 9.6  --  9.0  PROT 6.7  --   --   BILITOT 0.7  --   --   ALKPHOS 47  --   --   ALT 17  --   --   AST 27  --   --   GLUCOSE 118*  < > 121*  < > = values in this interval not displayed.  Recent Labs  01/19/15 0452  INR 1.11    Radiology/Studies Dg Chest 2 View  01/18/2015   CLINICAL DATA:  Shortness of breath. Hypertension. Postoperative numbness.  EXAM: CHEST  2 VIEW  COMPARISON:  07/17/2009  FINDINGS: Shallow inspiration. Normal heart size and pulmonary vascularity. No focal airspace disease or consolidation in the lungs. No blunting of costophrenic angles. No pneumothorax. Calcified and tortuous aorta. Bilateral breast implants. Degenerative changes in the spine.  IMPRESSION: Shallow inspiration.  No evidence of active pulmonary disease.   Electronically Signed   By: Burman Nieves M.D.   On: 01/18/2015 04:55   Ct Head Wo Contrast  01/18/2015   CLINICAL DATA:  TIA symptoms. Patient has surgery yesterday and was released. Family noticed  left-sided facial droop and left-sided weakness of the upper and lower extremities around 1600 hours. Symptoms on off since then.  EXAM: CT HEAD WITHOUT CONTRAST  TECHNIQUE: Contiguous axial images were obtained from the base of the skull through the vertex without intravenous contrast.  COMPARISON:  MRI brain 09/06/2014.  CT head 09/05/2014.  FINDINGS: Diffuse cerebral atrophy. Low-attenuation changes in the deep white matter consistent with small vessel ischemia. Old lacune in the left basal ganglia. No mass effect or midline shift. No abnormal extra-axial fluid collections. Gray-white matter junctions are distinct. Basal cisterns are not effaced. No evidence of acute intracranial hemorrhage. No depressed skull fractures. Visualized paranasal sinuses and mastoid air cells are not opacified. Vascular calcifications.  IMPRESSION: No acute intracranial abnormalities. Chronic  atrophy and small vessel ischemic changes. Old lacunar infarct in the left basal ganglia. No change since prior study.   Electronically Signed   By: Burman Nieves M.D.   On: 01/18/2015 04:49   Mr Shirlee Latch XB Contrast  01/18/2015   ADDENDUM REPORT: 01/18/2015 07:56  ADDENDUM: Study discussed by telephone with Dr. Thana Farr on 01/18/2015 at 0744 hours.   Electronically Signed   By: Odessa Fleming M.D.   On: 01/18/2015 07:56   01/18/2015   CLINICAL DATA:  81 year old female status post recent lumbar surgery with acute left side weakness and facial droop. Initial encounter.  EXAM: MRI HEAD WITHOUT CONTRAST  MRA HEAD WITHOUT CONTRAST  TECHNIQUE: Multiplanar, multiecho pulse sequences of the brain and surrounding structures were obtained without intravenous contrast. Angiographic images of the head were obtained using MRA technique without contrast.  COMPARISON:  Head CT without contrast 0418 hours today. Brain MRI and intracranial MRA 09/06/2014.  FINDINGS: MRI HEAD FINDINGS  Confluent 2.5 cm area of restricted diffusion in the posterior right  ACA territory, with patchy involvement of the medial motor strip. Patchy right cingulate gyrus involvement. Subtle associated T2 and FLAIR hyperintensity. No definite associated hemorrhage. No mass effect.  Superimposed punctate restricted diffusion along the medial left thalamus (series 7, image 14). No other left hemisphere restricted diffusion identified. No posterior fossa restricted diffusion identified.  Major intracranial vascular flow voids are stable at the skullbase. Loss of the distal right ACA flow void with associated increased FLAIR signal, see MRA findings below.  Scattered round areas of T2* susceptibility appears related to the subarachnoid space, and corresponds to several small foci of very low density (negative 200-300 Hounsfield units) on the head CT ear earlier today.  Stable gray and white matter signal elsewhere. No ventriculomegaly. No midline shift. Basilar cisterns are patent. No extra-axial collection. No intracranial mass lesion. Pituitary and cervicomedullary junction appear stable. Normal bone marrow signal.  Internal auditory structures, paranasal sinuses, mastoids, orbits, and scalp soft tissues are stable.  MRA HEAD FINDINGS  Stable antegrade flow in the distal vertebral arteries. No distal vertebral or basilar artery stenosis. PICA origins remain patent. Stable SCA and PCA origins. Stable bilateral PCA branches, with mild to moderate irregularity and stenosis of the right P2 segment but preserved distal flow. Posterior communicating arteries are diminutive or absent.  Stable antegrade flow in both ICA siphons. Carotid termini remain patent. MCA and ACA origins are stable and within normal limits.  Increased irregularity of the bilateral MCA M1 segments and right ACA A1 segment, might in part be related to mild motion artifact today. Evidence of moderate to severe stenosis now in the left M1 segment. However, both MCA bifurcations remain patent. No M2 branch occlusion is identified,  with bilateral M2 branch irregularity re- identified.  The left ACA appears stable. The right ACA A1 segment is mildly irregular, an the right A2 segment is occluded in the pericallosal artery region. See series 4, images 118-123).  IMPRESSION: 1. Right ACA distal A2 segment occlusion with acute posterior right ACA infarct, involving some of the medial motor strip. Mild cytotoxic edema without hemorrhage or mass effect. 2. Increased left worse than right MCA M1 segment irregularity may reflect new thromboembolic disease. No major MCA branch occlusion. 3. Punctate acute infarct in the medial left thalamus with no mass effect or hemorrhage. The posterior circulation appears stable since March. 4. Scattered small areas of susceptibility appear related to small very low density foci in the subarachnoid space  on this CT earlier today. Favor trace pneumocephalus, presumably arising from the recent lumbar surgery.  Electronically Signed: By: Odessa FlemingH  Hall M.D. On: 01/18/2015 07:39   Mr Brain Wo Contrast  01/18/2015   ADDENDUM REPORT: 01/18/2015 07:56  ADDENDUM: Study discussed by telephone with Dr. Thana FarrLeslie Reynolds on 01/18/2015 at 0744 hours.   Electronically Signed   By: Odessa FlemingH  Hall M.D.   On: 01/18/2015 07:56   01/18/2015   CLINICAL DATA:  81 year old female status post recent lumbar surgery with acute left side weakness and facial droop. Initial encounter.  EXAM: MRI HEAD WITHOUT CONTRAST  MRA HEAD WITHOUT CONTRAST  TECHNIQUE: Multiplanar, multiecho pulse sequences of the brain and surrounding structures were obtained without intravenous contrast. Angiographic images of the head were obtained using MRA technique without contrast.  COMPARISON:  Head CT without contrast 0418 hours today. Brain MRI and intracranial MRA 09/06/2014.  FINDINGS: MRI HEAD FINDINGS  Confluent 2.5 cm area of restricted diffusion in the posterior right ACA territory, with patchy involvement of the medial motor strip. Patchy right cingulate gyrus  involvement. Subtle associated T2 and FLAIR hyperintensity. No definite associated hemorrhage. No mass effect.  Superimposed punctate restricted diffusion along the medial left thalamus (series 7, image 14). No other left hemisphere restricted diffusion identified. No posterior fossa restricted diffusion identified.  Major intracranial vascular flow voids are stable at the skullbase. Loss of the distal right ACA flow void with associated increased FLAIR signal, see MRA findings below.  Scattered round areas of T2* susceptibility appears related to the subarachnoid space, and corresponds to several small foci of very low density (negative 200-300 Hounsfield units) on the head CT ear earlier today.  Stable gray and white matter signal elsewhere. No ventriculomegaly. No midline shift. Basilar cisterns are patent. No extra-axial collection. No intracranial mass lesion. Pituitary and cervicomedullary junction appear stable. Normal bone marrow signal.  Internal auditory structures, paranasal sinuses, mastoids, orbits, and scalp soft tissues are stable.  MRA HEAD FINDINGS  Stable antegrade flow in the distal vertebral arteries. No distal vertebral or basilar artery stenosis. PICA origins remain patent. Stable SCA and PCA origins. Stable bilateral PCA branches, with mild to moderate irregularity and stenosis of the right P2 segment but preserved distal flow. Posterior communicating arteries are diminutive or absent.  Stable antegrade flow in both ICA siphons. Carotid termini remain patent. MCA and ACA origins are stable and within normal limits.  Increased irregularity of the bilateral MCA M1 segments and right ACA A1 segment, might in part be related to mild motion artifact today. Evidence of moderate to severe stenosis now in the left M1 segment. However, both MCA bifurcations remain patent. No M2 branch occlusion is identified, with bilateral M2 branch irregularity re- identified.  The left ACA appears stable. The right  ACA A1 segment is mildly irregular, an the right A2 segment is occluded in the pericallosal artery region. See series 4, images 118-123).  IMPRESSION: 1. Right ACA distal A2 segment occlusion with acute posterior right ACA infarct, involving some of the medial motor strip. Mild cytotoxic edema without hemorrhage or mass effect. 2. Increased left worse than right MCA M1 segment irregularity may reflect new thromboembolic disease. No major MCA branch occlusion. 3. Punctate acute infarct in the medial left thalamus with no mass effect or hemorrhage. The posterior circulation appears stable since March. 4. Scattered small areas of susceptibility appear related to small very low density foci in the subarachnoid space on this CT earlier today. Favor trace pneumocephalus, presumably arising  from the recent lumbar surgery.  Electronically Signed: By: Odessa Fleming M.D. On: 01/18/2015 07:39   Dg Lumbar Spine 1 View  01/17/2015   CLINICAL DATA:  Lumbar spine surgery.  EXAM: LUMBAR SPINE - 1 VIEW  COMPARISON:  MRI 12/21/2014 .  FINDINGS: Posterior and interbody fusion L4-L5. Metallic marker is noted along the posterior aspect of L4 . Lumbar vertebra numbered as per prior MRI of 12/21/2014.  IMPRESSION: Metallic marker noted along the posterior aspect of L4.   Electronically Signed   By: Maisie Fus  Register   On: 01/17/2015 09:58    Echocardiogram  TEE - pending  12-lead ECG SR w/ RBBB Telemetry SR   Assessment and Plan 1. Cryptogenic stroke  If the TEE is negative, we recommend loop recorder insertion to monitor for AF. The indication for loop recorder insertion / monitoring for AF in setting of cryptogenic stroke was discussed with the patient. The loop recorder insertion procedure was reviewed in detail including risks and benefits. These risks include but are not limited to bleeding and infection. The patient expressed verbal understanding and agrees to proceed. The patient was also counseled regarding wound care and  device follow-up.  SignedRobbie Lis 01/19/2015, 9:06 AM  EP Attending  Patient seen and examined. Agree with the findings as documented by Robbie Lis PA-C above. Her findings agree with mine. The patient has an unexplained stroke and has so far had a negative workup. We have been asked to see for possible insertion of an ILR. Agree with proceeding. See above.   Leonia Reeves.D.

## 2015-01-18 NOTE — Progress Notes (Signed)
Adonis HousekeeperBJ Keith stated that it was ok for Angela GrimeStephanie Keith to call. Please give information as suitable. Thanks.

## 2015-01-18 NOTE — Progress Notes (Signed)
STROKE TEAM PROGRESS NOTE   HISTORY Angela Keith is an 81 y.o. female with a past medical history significant for HTN, hyperlipidemia, presumed CVA (cerebral infarction) s/p IV tPA, MRI negative, GERD, who underwent uncomplicated lumbar decompressive laminectomy and foraminotomies on 7/12, brought in for further evaluation of intermittent left sided weakness, left face droop. Patient was doing well after discharge home, ambulating without assistance, but family indicated that yesterday around 4 pm (LKW 01/17/2015 at 1600) patient was noted to be intermittently dragging the left leg and complaining that the left leg was numb and weak. In addition, family also noted episodes in which her left arm was weak. Further, noted to have left face droopiness. As per patient and family, such episodes of left LE weakness can lasted for approximately one hour. She said that the left leg was weaker than the right leg before surgery. Complains of a mild frontal HA but denies vertigo, double vision, difficulty swallowing, unsteadiness, slurred speech, confusion, fatigue, language or vision impairment. Having intermittent cramps right LE. CT brain was reviewed and showed no acute abnormality. It is worth noting that she has been off plavix for 4 days due to recent surgery. Patient was not administered TPA secondary to surgery 7/12 and mild intermittent deficits. She was admitted for further evaluation and treatment.   SUBJECTIVE (INTERVAL HISTORY) Her daughter Judeth Cornfield and family are at the bedside.  She started loosing feeling on her L side yesterday - family recounted history with Dr. [name].    OBJECTIVE Temp:  [97.8 F (36.6 C)-98.6 F (37 C)] 97.8 F (36.6 C) (07/13 1122) Pulse Rate:  [54-83] 68 (07/13 1122) Cardiac Rhythm:  [-] Normal sinus rhythm;Bundle branch block (07/13 0251) Resp:  [16-29] 29 (07/13 1122) BP: (124-150)/(58-70) 124/64 mmHg (07/13 1122) SpO2:  [91 %-95 %] 95 % (07/13 1122)  No results  for input(s): GLUCAP in the last 168 hours.  Recent Labs Lab 01/11/15 1333 01/18/15 0341 01/18/15 0349  NA 137 134* 136  K 3.7 3.9 3.8  CL 101 99* 98*  CO2 29 27  --   GLUCOSE 102* 118* 115*  BUN CREATININE 1.06* 1.06* 1.00  CALCIUM 9.8 9.6  --     Recent Labs Lab 01/18/15 0341  AST 27  ALT 17  ALKPHOS 47  BILITOT 0.7  PROT 6.7  ALBUMIN 3.8    Recent Labs Lab 01/11/15 1333 01/18/15 0341 01/18/15 0349  WBC 6.5 10.9*  --   NEUTROABS 3.3 7.9*  --   HGB 13.0 11.5* 13.3  HCT 40.4 34.8* 39.0  MCV 87.4 85.3  --   PLT 261 224  --    No results for input(s): CKTOTAL, CKMB, CKMBINDEX, TROPONINI in the last 168 hours.  Recent Labs  01/18/15 0341  LABPROT 14.0  INR 1.06    Recent Labs  01/18/15 0513  COLORURINE YELLOW  LABSPEC 1.009  PHURINE 7.0  GLUCOSEU NEGATIVE  HGBUR NEGATIVE  BILIRUBINUR NEGATIVE  KETONESUR NEGATIVE  PROTEINUR NEGATIVE  UROBILINOGEN 0.2  NITRITE NEGATIVE  LEUKOCYTESUR NEGATIVE       Component Value Date/Time   CHOL 200 09/06/2014 0224   TRIG 51 09/06/2014 0224   HDL 53 09/06/2014 0224   CHOLHDL 3.8 09/06/2014 0224   VLDL 10 09/06/2014 0224   LDLCALC 137* 09/06/2014 0224   Lab Results  Component Value Date   HGBA1C 6.0* 09/06/2014      Component Value Date/Time   LABOPIA NONE DETECTED 09/05/2014 1149   COCAINSCRNUR NONE  DETECTED 09/05/2014 1149   LABBENZ NONE DETECTED 09/05/2014 1149   AMPHETMU NONE DETECTED 09/05/2014 1149   THCU NONE DETECTED 09/05/2014 1149   LABBARB NONE DETECTED 09/05/2014 1149    No results for input(s): ETH in the last 168 hours.  Dg Chest 2 View 01/18/2015    Shallow inspiration.  No evidence of active pulmonary disease.     Ct Head Wo Contrast 01/18/2015   No acute intracranial abnormalities. Chronic atrophy and small vessel ischemic changes. Old lacunar infarct in the left basal ganglia. No change since prior study.     MRI & MRA Brain Wo Contrast 01/18/2015    1. Right ACA distal  A2 segment occlusion with acute posterior right ACA infarct, involving some of the medial motor strip. Mild cytotoxic edema without hemorrhage or mass effect. 2. Increased left worse than right MCA M1 segment irregularity may reflect new thromboembolic disease. No major MCA branch occlusion. 3. Punctate acute infarct in the medial left thalamus with no mass effect or hemorrhage. The posterior circulation appears stable since March. 4. Scattered small areas of susceptibility appear related to small very low density foci in the subarachnoid space on this CT earlier today. Favor trace pneumocephalus, presumably arising from the recent lumbar surgery.    Dg Lumbar Spine 1 View 01/17/2015    Metallic marker noted along the posterior aspect of L4.       PHYSICAL EXAM Pleasant elderly caucasian lady not in distress. . Afebrile. Head is nontraumatic. Neck is supple without bruit.    Cardiac exam no murmur or gallop. Lungs are clear to auscultation. Distal pulses are well felt. Neurological Exam :  Awake alert oriented x 3 normal speech and language. Mild left lower face asymmetry. Tongue midline. No drift. Mild diminished fine finger movements on left. Orbits right over left upper extremity. Mild left grip weak..LLe 3/5 strength Diminished left hemibody sensation . Normal coordination. ASSESSMENT/PLAN [name] is a 81 y.o. female with history of HTN, hyperlipidemia, presumed CVA (cerebral infarction) s/p IV tPA, MRI negative, GERD, who underwent uncomplicated lumbar decompressive laminectomy and foraminotomies on 7/12, brought in for further evaluation of intermittent left sided weakness, left face droop. She did not receive IV t-PA due to recent surgery and mild intermittent deficits.   Stroke:  Non-dominant right ACA and punctate L thalamic infarcts,  embolic secondary to unknown source  Resultant  Left sided hemiparesis, L facial weakness  MRI  R ACA infarct, punctate L thalamic infarct  MRA   L > R MCA M1 disease, R A2 occlusion (new since March), posterior circulation okay TEE to look for embolic source. Arranged with [facility] for tomorrow.  If positive for PFO (patent foramen ovale), check bilateral lower extremity venous dopplers to rule out DVT as possible source of stroke. (I have made patient NPO after midnight tonight).  If TEE negative, a [facility] electrophysiologist will consult and consider placement of an implantable loop recorder to evaluate for atrial fibrillation as etiology of stroke. This has been explained to patient/family by Dr. Pearlean BrownieSethi and they are agreeable.   LDL 137 in March  HgbA1c 6.0 in March  No VTE prophylaxis noted  Diet NPO time specified  clopidogrel 75 mg orally every day prior to admission (had been on dual antiplatelets x 3 mos following stroke in March), now on no antithrombotics ok to resume Plavix from surgeon standpoint. Have reordered  Ongoing aggressive stroke risk factor management  Therapy  recommendations:  pending   Disposition:  pending   Hypertension  Stable  Hyperlipidemia  Home meds:  pravachol 40, resumed in hospital  LDL 137 in March, goal < 70  As already on a statin, will not recheck LDL as plan to continue statin at discharge  Other Stroke Risk Factors  Advanced age  Hx stroke/TIA - presumed CVA (cerebral infarction) s/p IV tPA, MRI negative,  Family hx stroke (father)  Other Active Problems  Severe spinal stenosis at L2-3 and L3-4 with neurogenic claudication s/p L2-3 L3-4 decompressive laminectomy with foraminotomies  Hospital day # 0  [facility]oder[facility] See Amion for Pager information 01/18/2015 10:48 PM  I have personally examined this patient, reviewed notes, independently viewed imaging studies, participated in medical decision making and plan of care. I have made any additions or clarifications directly to the above note. Agree  with note above.  She has h/o prior stroke and new difficulty walking from new right ACA and left medial thalamicinfarcts likely from unknown embolic source and is at risk for neurological worsening, recurrent stroke and TIA and needs ongoing stroke evaluation and aggressive risk factor control. Check TEE and loop recorder implant. D/w patient, daughetr and answered questions.Resume plavix when safe post surgery   Delia Heady, MD Medical Director [facility] Pager: [phone] 01/18/2015 11:28 PM    To contact Stroke Continuity provider, please refer to [email or web address]. After hours, contact General Neurology

## 2015-01-18 NOTE — ED Notes (Signed)
Dr. Camillo at the bedside.  

## 2015-01-18 NOTE — Progress Notes (Signed)
Pt admitted after midnight. Chart reviewed. Pt examined. MRI with acute CVA.  Neuro aware.  Crista Curborinna Kippy Gohman, MD [facility]

## 2015-01-18 NOTE — ED Notes (Signed)
Family at bedside. 

## 2015-01-18 NOTE — ED Notes (Addendum)
Dr. Julian ReilGardner at the bedside speaking with patient and family. Pt gone to MRI at this time.

## 2015-01-18 NOTE — Significant Event (Signed)
Patient seen and evaluated at bedside: has been having what appears to be sick sinus syndrome on the monitor since about 5 pm today.  Has been as brady as 40 on monitor, but nothing below on the actual electrical monitor (pleth in room is incorrectly reading as low as 25 including while I was in there but this does not correlate with the electrical monitor).  Patient is currently asymptomatic from the bradycardia standpoint and this is likely unrelated to her stroke this admission.  It may (or may not) however, explain her syncopal episode that she had some 10 months ago (syncope last sept, saw cardiology, 7 day recorder yielded no findings).  Updated Dr. Shirlee LatchMcLean with this info, he says as long as patient dosent go any below 40 and remains asymptomatic, to just get an EKG and the ECP doc will review her monitor wave forms in the morning (ECP is already seeing this patient tomorrow for implantable loop-recorder).

## 2015-01-18 NOTE — Consult Note (Signed)
Referring Physician: Dr Randal Buba    Chief Complaint: intermittent left sided weakness, left face droop  HPI:                                                                                                                                         Angela Keith is an 81 y.o. female with a past medical history significant for HTN, hyperlipidemia, presumed CVA (cerebral infarction) s/p IV tPA, MRI negative, GERD, who underwent uncomplicated lumbar decompressive laminectomy and foraminotomies on 7/12, brought in for further evaluation of the aforementioned symptoms. Patient was doing well after discharge home, ambulating without assistance, but family indicated that yesterday around 4 pm patient was noted to be intermittently dragging the left leg and complaining that the left leg was numb and weak. In addition, family also noted episodes in which her left arm was weak. Further, noted to have left face droopiness. As per patient and family, such episodes of left LE weakness can lasted for approximately one hour. She said that the left leg was weaker than the right leg before surgery. Complains of a mild frontal HA but denies vertigo, double vision, difficulty swallowing, unsteadiness, slurred speech, confusion,  fatigue, language or vision impairment. Having intermittent cramps right LE. CT brain was personally reviewed and showed no acute abnormality. It is worth noting that she has been off plavix for 4 days due to recent surgery.   Date last known well: 01/17/15 Time last known well: 4 pm tPA Given: no, surgery 7/12, mild intermittent deficits.   Past Medical History  Diagnosis Date  . Hypothyroidism   . Arthritis   . GERD (gastroesophageal reflux disease)   . Dysphagia   . Hyperlipidemia   . Right bundle branch block 03/21/2014  . HTN (hypertension) 10/30/2012  . Unspecified hypothyroidism 10/30/2012  . Severe hearing loss   . DJD (degenerative joint disease), lumbar   . Rotoscoliosis   .  Glaucoma   . DJD (degenerative joint disease), cervical   . Chronic neck pain   . Nocturia   . Insomnia   . Stroke   . Shortness of breath dyspnea     Past Surgical History  Procedure Laterality Date  . Breast surgery    . Back surgery      lower back   . Esophagogastroduodenoscopy (egd) with esophageal dilation  06/08/2012    Procedure: ESOPHAGOGASTRODUODENOSCOPY (EGD) WITH ESOPHAGEAL DILATION;  Surgeon: Garlan Fair, MD;  Location: [facility];  Service: Endoscopy;  Laterality: N/A;  . Bladder laser surgery    . Left carpal tunnel surgery    . Facail surgery  2000    facelift and eyelid lift  . Cataract surgery      Family History  Problem Relation Age of Onset  . CAD Father   . Stroke Father   . CAD Brother   . Diabetes Mother   . Lung cancer  Other    Social History:  reports that she has never smoked. She does not have any smokeless tobacco history on file. She reports that she does not drink alcohol or use illicit drugs.  Allergies:  Allergies  Allergen Reactions  . Ace Inhibitors Other (See Comments)    Severe fatigue  . Lipitor [Atorvastatin] Other (See Comments)    Mood swings  . Motrin [Ibuprofen] Nausea And Vomiting    Medications:                                                                                                                           I have reviewed the patient's current medications.  ROS:                                                                                                                                       History obtained from family, chart review and the patient  General ROS: negative for - chills, fatigue, fever, night sweats, weight gain or weight loss Psychological ROS: negative for - behavioral disorder, hallucinations, memory difficulties, mood swings or suicidal ideation Ophthalmic ROS: negative for - blurry vision, double vision, eye pain or loss of vision ENT ROS: negative for - epistaxis, nasal discharge,  oral lesions, sore throat, tinnitus or vertigo Allergy and Immunology ROS: negative for - hives or itchy/watery eyes Hematological and Lymphatic ROS: negative for - bleeding problems, bruising or swollen lymph nodes Endocrine ROS: negative for - galactorrhea, hair pattern changes, polydipsia/polyuria or temperature intolerance Respiratory ROS: negative for - cough, hemoptysis, shortness of breath or wheezing Cardiovascular ROS: negative for - chest pain, dyspnea on exertion, edema or irregular heartbeat Gastrointestinal ROS: negative for - abdominal pain, diarrhea, hematemesis, nausea/vomiting or stool incontinence Genito-Urinary ROS: negative for - dysuria or hematuria Musculoskeletal ROS: negative for - joint swelling o Neurological ROS: as noted in HPI Dermatological ROS: negative for rash and skin lesion changes  Physical exam: pleasant female in no apparent distress. Blood pressure 135/62, pulse 82, temperature 98.5 F (36.9 C), temperature source Oral, resp. rate 17, SpO2 93 %. Head: normocephalic. Neck: supple, no bruits, no JVD. Cardiac: no murmurs. Lungs: clear. Abdomen: soft, no tender, no mass. Extremities: no edema. Skin: no rash  Neurologic Examination:  General: Mental Status: Alert, oriented, thought content appropriate.  Speech fluent without evidence of aphasia.  Able to follow 3 step commands without difficulty. Cranial Nerves: II: Discs flat bilaterally; Visual fields grossly normal, pupils equal, round, reactive to light and accommodation III,IV, VI: ptosis not present, extra-ocular motions intact bilaterally V,VII: smile asymmetric due to subtle left face weakness, facial light touch sensation normal bilaterally VIII: hearing normal bilaterally IX,X: uvula rises symmetrically XI: bilateral shoulder shrug XII: midline tongue extension without atrophy or  fasciculations  Motor: Right : Upper extremity   5/5    Left:     Upper extremity   5/5  Lower extremity   5/5     Lower extremity   5/5 Tone and bulk:normal tone throughout; no atrophy noted Sensory: Pinprick and light touch intact throughout, bilaterally Deep Tendon Reflexes:  Right: Upper Extremity   Left: Upper extremity   biceps (C-5 to C-6) 2/4   biceps (C-5 to C-6) 2/4 tricep (C7) 2/4    triceps (C7) 2/4 Brachioradialis (C6) 2/4  Brachioradialis (C6) 2/4  Lower Extremity Lower Extremity  quadriceps (L-2 to L-4) 2/4   quadriceps (L-2 to L-4) 2/4 Achilles (S1) 2/4   Achilles (S1) 2/4  Plantars: Right: downgoing   Left: downgoing Cerebellar: normal finger-to-nose,  normal heel-to-shin test Gait:  No tested due to multiple leads and recent surgery    Results for orders placed or performed during the hospital encounter of 01/18/15 (from the past 48 hour(s))  Protime-INR     Status: None   Collection Time: 01/18/15  3:41 AM  Result Value Ref Range   Prothrombin Time 14.0 11.6 - 15.2 seconds   INR 1.06 0.00 - 1.49  APTT     Status: None   Collection Time: 01/18/15  3:41 AM  Result Value Ref Range   aPTT 26 24 - 37 seconds  CBC     Status: Abnormal   Collection Time: 01/18/15  3:41 AM  Result Value Ref Range   WBC 10.9 (H) 4.0 - 10.5 K/uL   RBC 4.08 3.87 - 5.11 MIL/uL   Hemoglobin 11.5 (L) 12.0 - 15.0 g/dL   HCT 34.8 (L) 36.0 - 46.0 %   MCV 85.3 78.0 - 100.0 fL   MCH 28.2 26.0 - 34.0 pg   MCHC 33.0 30.0 - 36.0 g/dL   RDW 13.9 11.5 - 15.5 %   Platelets 224 150 - 400 K/uL  Differential     Status: Abnormal   Collection Time: 01/18/15  3:41 AM  Result Value Ref Range   Neutrophils Relative % 72 43 - 77 %   Neutro Abs 7.9 (H) 1.7 - 7.7 K/uL   Lymphocytes Relative 15 12 - 46 %   Lymphs Abs 1.7 0.7 - 4.0 K/uL   Monocytes Relative 13 (H) 3 - 12 %   Monocytes Absolute 1.4 (H) 0.1 - 1.0 K/uL   Eosinophils Relative 0 0 - 5 %   Eosinophils Absolute 0.0 0.0 - 0.7 K/uL    Basophils Relative 0 0 - 1 %   Basophils Absolute 0.0 0.0 - 0.1 K/uL  Comprehensive metabolic panel     Status: Abnormal   Collection Time: 01/18/15  3:41 AM  Result Value Ref Range   Sodium 134 (L) 135 - 145 mmol/L   Potassium 3.9 3.5 - 5.1 mmol/L   Chloride 99 (L) 101 - 111 mmol/L   CO2 27 22 - 32 mmol/L   Glucose, Bld 118 (H) 65 - 99 mg/dL  BUN 14 6 - 20 mg/dL   Creatinine, Ser 1.06 (H) 0.44 - 1.00 mg/dL   Calcium 9.6 8.9 - 10.3 mg/dL   Total Protein 6.7 6.5 - 8.1 g/dL   Albumin 3.8 3.5 - 5.0 g/dL   AST 27 15 - 41 U/L   ALT 17 14 - 54 U/L   Alkaline Phosphatase 47 38 - 126 U/L   Total Bilirubin 0.7 0.3 - 1.2 mg/dL   GFR calc non Af Amer 48 (L) >60 mL/min   GFR calc Af Amer 55 (L) >60 mL/min    Comment: (NOTE) The eGFR has been calculated using the CKD EPI equation. This calculation has not been validated in all clinical situations. eGFR's persistently <60 mL/min signify possible Chronic Kidney Disease.    Anion gap 8 5 - 15  I-stat troponin, ED (not at [facility])     Status: None   Collection Time: 01/18/15  3:47 AM  Result Value Ref Range   Troponin i, poc 0.01 0.00 - 0.08 ng/mL   Comment 3            Comment: Due to the release kinetics of cTnI, a negative result within the first hours of the onset of symptoms does not rule out myocardial infarction with certainty. If myocardial infarction is still suspected, repeat the test at appropriate intervals.   I-Stat Chem 8, ED  (not at [facility])     Status: Abnormal   Collection Time: 01/18/15  3:49 AM  Result Value Ref Range   Sodium 136 135 - 145 mmol/L   Potassium 3.8 3.5 - 5.1 mmol/L   Chloride 98 (L) 101 - 111 mmol/L   BUN 16 6 - 20 mg/dL   Creatinine, Ser 1.00 0.44 - 1.00 mg/dL   Glucose, Bld 115 (H) 65 - 99 mg/dL   Calcium, Ion 1.23 1.13 - 1.30 mmol/L   TCO2 23 0 - 100 mmol/L   Hemoglobin 13.3 12.0 - 15.0 g/dL   HCT 39.0 36.0 - 46.0 %   Dg Chest 2 View  01/18/2015   CLINICAL DATA:  Shortness of breath.  Hypertension. Postoperative numbness.  EXAM: CHEST  2 VIEW  COMPARISON:  07/17/2009  FINDINGS: Shallow inspiration. Normal heart size and pulmonary vascularity. No focal airspace disease or consolidation in the lungs. No blunting of costophrenic angles. No pneumothorax. Calcified and tortuous aorta. Bilateral breast implants. Degenerative changes in the spine.  IMPRESSION: Shallow inspiration.  No evidence of active pulmonary disease.   Electronically Signed   By: Lucienne Capers M.D.   On: 01/18/2015 04:55   Ct Head Wo Contrast  01/18/2015   CLINICAL DATA:  TIA symptoms. Patient has surgery yesterday and was released. Family noticed left-sided facial droop and left-sided weakness of the upper and lower extremities around 1600 hours. Symptoms on off since then.  EXAM: CT HEAD WITHOUT CONTRAST  TECHNIQUE: Contiguous axial images were obtained from the base of the skull through the vertex without intravenous contrast.  COMPARISON:  MRI brain 09/06/2014.  CT head 09/05/2014.  FINDINGS: Diffuse cerebral atrophy. Low-attenuation changes in the deep white matter consistent with small vessel ischemia. Old lacune in the left basal ganglia. No mass effect or midline shift. No abnormal extra-axial fluid collections. Gray-white matter junctions are distinct. Basal cisterns are not effaced. No evidence of acute intracranial hemorrhage. No depressed skull fractures. Visualized paranasal sinuses and mastoid air cells are not opacified. Vascular calcifications.  IMPRESSION: No acute intracranial abnormalities. Chronic atrophy and small vessel ischemic changes.  Old lacunar infarct in the left basal ganglia. No change since prior study.   Electronically Signed   By: Lucienne Capers M.D.   On: 01/18/2015 04:49   Dg Lumbar Spine 1 View  01/17/2015   CLINICAL DATA:  Lumbar spine surgery.  EXAM: LUMBAR SPINE - 1 VIEW  COMPARISON:  MRI 12/21/2014 .  FINDINGS: Posterior and interbody fusion L4-L5. Metallic marker is noted along  the posterior aspect of L4 . Lumbar vertebra numbered as per prior MRI of 12/21/2014.  IMPRESSION: Metallic marker noted along the posterior aspect of L4.   Electronically Signed   By: Marcello Moores  Register   On: 01/17/2015 09:58    Assessment: 81 y.o. female with a past medical history significant for HTN, hyperlipidemia, presumed CVA (cerebral infarction) s/p IV tPA, MRI negative, presents with transient episodes of left sided weakness, left face droop. Neuro-exam is not particularly impressive, perhaps very subtle left face weakness. Off plavix for the past 4 days due to surgery. Unclear to me if she is certainly having right brain TIA's, or the LLE weakness being related to very recent surgery. In any case, she had comprehensive stroke work up completed 3/16 and no need to repeat this. MRI brain. Will discussed with neurosurgery likely need to resume plavix sooner. Stroke team will resume care tomorrow.  Stroke Risk Factors - age,  HTN, hyperlipidemia, presumed CVA   Dorian Pod, MD [facility] [phone]  01/18/2015, 5:04 AM

## 2015-01-18 NOTE — Progress Notes (Signed)
PHARMACIST - PHYSICIAN ORDER COMMUNICATION  CONCERNING: P&T Medication Policy on Herbal Medications  DESCRIPTION:  This patient's order for:  CoQ10  has been noted.  This product(s) is classified as an "herbal" or natural product. Due to a lack of definitive safety studies or FDA approval, nonstandard manufacturing practices, plus the potential risk of unknown drug-drug interactions while on inpatient medications, the Pharmacy and Therapeutics Committee does not permit the use of "herbal" or natural products of this type within [place].   ACTION TAKEN: The pharmacy department is unable to verify this order at this time and your patient has been informed of this safety policy. Please reevaluate patient's clinical condition at discharge and address if the herbal or natural product(s) should be resumed at that time.  Maston Wight S. Katriel Cutsforth, PharmD, BCPS Clinical Staff Pharmacist Pager [phone]   

## 2015-01-18 NOTE — ED Provider Notes (Signed)
CSN: [id number]   Arrival date & time 01/18/15 0244  History  This chart was scribed for  Aliyana Dlugosz, MD by Bethel Born, ED Scribe. This patient was seen in room B19C/B19C and the patient's care was started at 3:25 AM.  Chief Complaint  Patient presents with  . Transient Ischemic Attack    HPI Patient is a 81 y.o. female presenting with weakness. The history is provided by the patient and a relative. No language interpreter was used.  Weakness This is a new problem. The current episode started yesterday. The problem occurs hourly. The problem has not changed since onset.Associated symptoms include headaches. Nothing aggravates the symptoms. Nothing relieves the symptoms. She has tried nothing for the symptoms. The treatment provided no relief.   Angela Keith is a 81 y.o. female with PMHx of stroke, HTN, HLD, hypothyroidism, and RBBB who presents to the Emergency Department complaining of left sided weakness with onset yesterday (01/17/15) after surgery . Pt had microdiskectomy surgery today and after discharge her daughter noticed intermittent left sided weakness.  The weakness is in the [name] and LLE. The episodes last for 5-10 minutes minimally and 2 hours maximally. In between the episodes the pt was able to get up and walk. She also complains of intermittent headache and decreased urination. Pt was catheterized yesterday and 550 ccs of urine were expelled. She has been off Plavix for 3 days. Nurse at bedside for interview.   Past Medical History  Diagnosis Date  . Hypothyroidism   . Arthritis   . GERD (gastroesophageal reflux disease)   . Dysphagia   . Hyperlipidemia   . Right bundle branch block 03/21/2014  . HTN (hypertension) 10/30/2012  . Unspecified hypothyroidism 10/30/2012  . Severe hearing loss   . DJD (degenerative joint disease), lumbar   . Rotoscoliosis   . Glaucoma   . DJD (degenerative joint disease), cervical   . Chronic neck pain   . Nocturia   . Insomnia   .  Stroke   . Shortness of breath dyspnea     Past Surgical History  Procedure Laterality Date  . Breast surgery    . Back surgery      lower back   . Esophagogastroduodenoscopy (egd) with esophageal dilation  06/08/2012    Procedure: ESOPHAGOGASTRODUODENOSCOPY (EGD) WITH ESOPHAGEAL DILATION;  Surgeon: Charolett Bumpers, MD;  Location: [facility];  Service: Endoscopy;  Laterality: N/A;  . Bladder laser surgery    . Left carpal tunnel surgery    . Facail surgery  2000    facelift and eyelid lift  . Cataract surgery      Family History  Problem Relation Age of Onset  . CAD Father   . Stroke Father   . CAD Brother   . Diabetes Mother   . Lung cancer Other     History  Substance Use Topics  . Smoking status: Never Smoker   . Smokeless tobacco: Not on file  . Alcohol Use: No     Review of Systems  Genitourinary:       Decreased urination  Neurological: Positive for focal weakness, weakness and headaches. Negative for dizziness.  All other systems reviewed and are negative.   Home Medications   Prior to Admission medications   Medication Sig Start Date End Date Taking? Authorizing Provider  amLODipine (NORVASC) 5 MG tablet Take 2.5-5 mg by mouth daily. On Mon, Wed, Fri. Take 2.5 mg if BP <145/90    Historical Provider, MD  Bilberry 1000  MG CAPS Take 100 mg by mouth daily.    Historical Provider, MD  calcium carbonate (OS-CAL) 600 MG TABS Take 600 mg by mouth daily.    Historical Provider, MD  clopidogrel (PLAVIX) 75 MG tablet Take 1 tablet (75 mg total) by mouth daily. 09/07/14   Layne BentonSharon L Biby, NP  Coenzyme Q10 (COQ10) 200 MG CAPS Take 200 mg by mouth daily.    Historical Provider, MD  dorzolamide-timolol (COSOPT) 22.3-6.8 MG/ML ophthalmic solution Place 1 drop into both eyes 2 (two) times daily.    Historical Provider, MD  Glucosamine-Chondroitin (MOVE FREE PO) Take 1 tablet by mouth 2 (two) times daily.    Historical Provider, MD  HYDROcodone-acetaminophen (NORCO/VICODIN)  5-325 MG per tablet Take 1-2 tablets by mouth every 4 (four) hours as needed (mild pain). 01/17/15   Julio SicksHenry Pool, MD  latanoprost (XALATAN) 0.005 % ophthalmic solution Place 1 drop into both eyes at bedtime.    Historical Provider, MD  levothyroxine (SYNTHROID, LEVOTHROID) 88 MCG tablet Take 88 mcg by mouth daily before breakfast.    Historical Provider, MD  losartan-hydrochlorothiazide (HYZAAR) 50-12.5 MG per tablet Take 1 tablet by mouth daily.    Historical Provider, MD  metoprolol tartrate (LOPRESSOR) 25 MG tablet Take 25 mg by mouth 2 (two) times daily.    Historical Provider, MD  Multiple Vitamins-Minerals (ICAPS PO) Take by mouth daily.    Historical Provider, MD  nitroGLYCERIN (NITROSTAT) 0.4 MG SL tablet Place 0.4 mg under the tongue as directed.    Historical Provider, MD  pantoprazole (PROTONIX) 40 MG tablet Take 40 mg by mouth 2 (two) times daily.    Historical Provider, MD  pravastatin (PRAVACHOL) 40 MG tablet Take 1 tablet (40 mg total) by mouth daily at 6 PM. 09/07/14   Layne BentonSharon L Biby, NP    Allergies  Ace inhibitors; Lipitor; and Motrin  Triage Vitals: BP 135/62 mmHg  Pulse 82  Temp(Src) 98.5 F (36.9 C) (Oral)  Resp 17  SpO2 93%  Physical Exam  Constitutional: She is oriented to person, place, and time. She appears well-developed and well-nourished. No distress.  HENT:  Head: Normocephalic and atraumatic.  Mouth/Throat: Oropharynx is clear and moist.  Moist mucous membranes  Eyes: Conjunctivae and EOM are normal.  Lens implants after the year 2001  Neck: Normal range of motion. Neck supple. No tracheal deviation present.  Cardiovascular: Normal rate and regular rhythm.   Pulses:      Dorsalis pedis pulses are 2+ on the right side, and 2+ on the left side.  Pulmonary/Chest: Effort normal and breath sounds normal. No respiratory distress. She has no wheezes. She has no rales.  Abdominal: Soft. Bowel sounds are normal. She exhibits no mass. There is no tenderness. There is  no rebound and no guarding.  Musculoskeletal: Normal range of motion. She exhibits no edema.  Neurological: She is alert and oriented to person, place, and time. She has normal reflexes. She displays normal reflexes. No cranial nerve deficit.  Cranial nerves intact Normal lower DTRs Babinski down going on right, up going on the left   Skin: Skin is warm and dry.  81 week old bruise at left knee  Psychiatric: She has a normal mood and affect. Her behavior is normal.  Nursing note and vitals reviewed.   ED Course  Procedures   DIAGNOSTIC STUDIES: Oxygen Saturation is 93% on RA, normal by my interpretation.    COORDINATION OF CARE: 3:34 AM Discussed treatment plan which includes lab work and EKG  with pt at bedside and pt agreed to plan.  Labs Review-  Labs Reviewed  PROTIME-INR  APTT  CBC  DIFFERENTIAL  COMPREHENSIVE METABOLIC PANEL  I-STAT TROPOININ, ED  I-STAT CHEM 8, ED    Imaging Review Dg Lumbar Spine 1 View  01/17/2015   CLINICAL DATA:  Lumbar spine surgery.  EXAM: LUMBAR SPINE - 1 VIEW  COMPARISON:  MRI 12/21/2014 .  FINDINGS: Posterior and interbody fusion L4-L5. Metallic marker is noted along the posterior aspect of L4 . Lumbar vertebra numbered as per prior MRI of 12/21/2014.  IMPRESSION: Metallic marker noted along the posterior aspect of L4.   Electronically Signed   By: Maisie Fus  Register   On: 01/17/2015 09:58    EKG Interpretation  Date/Time:  Wednesday January 18 2015 02:55:32 EDT Ventricular Rate:  80 PR Interval:  183 QRS Duration: 130 QT Interval:  445 QTC Calculation: 513 R Axis:   35 Text Interpretation:  Sinus rhythm Right bundle branch block Confirmed by [facility]  MD, Morene Antu ([id number]) on 01/18/2015 2:57:34 AM       MDM   Final diagnoses:  None   See by Dr. Leroy Kennedy, admit to medicine Results for orders placed or performed during the hospital encounter of 01/18/15  Protime-INR  Result Value Ref Range   Prothrombin Time 14.0 11.6 - 15.2 seconds    INR 1.06 0.00 - 1.49  APTT  Result Value Ref Range   aPTT 26 24 - 37 seconds  CBC  Result Value Ref Range   WBC 10.9 (H) 4.0 - 10.5 K/uL   RBC 4.08 3.87 - 5.11 MIL/uL   Hemoglobin 11.5 (L) 12.0 - 15.0 g/dL   HCT 60.4 (L) 54.0 - 98.1 %   MCV 85.3 78.0 - 100.0 fL   MCH 28.2 26.0 - 34.0 pg   MCHC 33.0 30.0 - 36.0 g/dL   RDW 19.1 47.8 - 29.5 %   Platelets 224 150 - 400 K/uL  Differential  Result Value Ref Range   Neutrophils Relative % 72 43 - 77 %   Neutro Abs 7.9 (H) 1.7 - 7.7 K/uL   Lymphocytes Relative 15 12 - 46 %   Lymphs Abs 1.7 0.7 - 4.0 K/uL   Monocytes Relative 13 (H) 3 - 12 %   Monocytes Absolute 1.4 (H) 0.1 - 1.0 K/uL   Eosinophils Relative 0 0 - 5 %   Eosinophils Absolute 0.0 0.0 - 0.7 K/uL   Basophils Relative 0 0 - 1 %   Basophils Absolute 0.0 0.0 - 0.1 K/uL  Comprehensive metabolic panel  Result Value Ref Range   Sodium 134 (L) 135 - 145 mmol/L   Potassium 3.9 3.5 - 5.1 mmol/L   Chloride 99 (L) 101 - 111 mmol/L   CO2 27 22 - 32 mmol/L   Glucose, Bld 118 (H) 65 - 99 mg/dL   BUN 14 6 - 20 mg/dL   Creatinine, Ser 6.21 (H) 0.44 - 1.00 mg/dL   Calcium 9.6 8.9 - 30.8 mg/dL   Total Protein 6.7 6.5 - 8.1 g/dL   Albumin 3.8 3.5 - 5.0 g/dL   AST 27 15 - 41 U/L   ALT 17 14 - 54 U/L   Alkaline Phosphatase 47 38 - 126 U/L   Total Bilirubin 0.7 0.3 - 1.2 mg/dL   GFR calc non Af Amer 48 (L) >60 mL/min   GFR calc Af Amer 55 (L) >60 mL/min   Anion gap 8 5 - 15  I-stat troponin, ED (not at  [facility], [facility])  Result Value Ref Range   Troponin i, poc 0.01 0.00 - 0.08 ng/mL   Comment 3          I-Stat Chem 8, ED  (not at [facility], [facility])  Result Value Ref Range   Sodium 136 135 - 145 mmol/L   Potassium 3.8 3.5 - 5.1 mmol/L   Chloride 98 (L) 101 - 111 mmol/L   BUN 16 6 - 20 mg/dL   Creatinine, Ser 1.61 0.44 - 1.00 mg/dL   Glucose, Bld 096 (H) 65 - 99 mg/dL   Calcium, Ion 0.45 4.09 - 1.30 mmol/L   TCO2 23 0 - 100 mmol/L   Hemoglobin 13.3 12.0 - 15.0 g/dL   HCT 81.1 91.4 - 78.2  %   Dg Chest 2 View  01/18/2015   CLINICAL DATA:  Shortness of breath. Hypertension. Postoperative numbness.  EXAM: CHEST  2 VIEW  COMPARISON:  07/17/2009  FINDINGS: Shallow inspiration. Normal heart size and pulmonary vascularity. No focal airspace disease or consolidation in the lungs. No blunting of costophrenic angles. No pneumothorax. Calcified and tortuous aorta. Bilateral breast implants. Degenerative changes in the spine.  IMPRESSION: Shallow inspiration.  No evidence of active pulmonary disease.   Electronically Signed   By: Burman Nieves M.D.   On: 01/18/2015 04:55   Ct Head Wo Contrast  01/18/2015   CLINICAL DATA:  TIA symptoms. Patient has surgery yesterday and was released. Family noticed left-sided facial droop and left-sided weakness of the upper and lower extremities around 1600 hours. Symptoms on off since then.  EXAM: CT HEAD WITHOUT CONTRAST  TECHNIQUE: Contiguous axial images were obtained from the base of the skull through the vertex without intravenous contrast.  COMPARISON:  MRI brain 09/06/2014.  CT head 09/05/2014.  FINDINGS: Diffuse cerebral atrophy. Low-attenuation changes in the deep white matter consistent with small vessel ischemia. Old lacune in the left basal ganglia. No mass effect or midline shift. No abnormal extra-axial fluid collections. Gray-white matter junctions are distinct. Basal cisterns are not effaced. No evidence of acute intracranial hemorrhage. No depressed skull fractures. Visualized paranasal sinuses and mastoid air cells are not opacified. Vascular calcifications.  IMPRESSION: No acute intracranial abnormalities. Chronic atrophy and small vessel ischemic changes. Old lacunar infarct in the left basal ganglia. No change since prior study.   Electronically Signed   By: Burman Nieves M.D.   On: 01/18/2015 04:49   Dg Lumbar Spine 1 View  01/17/2015   CLINICAL DATA:  Lumbar spine surgery.  EXAM: LUMBAR SPINE - 1 VIEW  COMPARISON:  MRI 12/21/2014 .   FINDINGS: Posterior and interbody fusion L4-L5. Metallic marker is noted along the posterior aspect of L4 . Lumbar vertebra numbered as per prior MRI of 12/21/2014.  IMPRESSION: Metallic marker noted along the posterior aspect of L4.   Electronically Signed   By: Maisie Fus  Register   On: 01/17/2015 09:58      I personally performed the services described in this documentation, which was scribed in my presence. The recorded information has been reviewed and is accurate.     Cy Blamer, MD 01/18/15 [phone]

## 2015-01-18 NOTE — Progress Notes (Signed)
Patient arrived around 0930 alert and oriented MRI positive for right acute ACA infarct, SCD ordered, she is NPO at this time SLP evaluation ordered q2 nero'ws and vital until 2130, just had L2-3, L3-4 decompressive laminectomies with bilateral L2, L3, L4 decompressive foraminotomies performed by DR Jordan LikesPool she had a stroke post surgery. Will continue to monitor.

## 2015-01-18 NOTE — Progress Notes (Signed)
Patient's pulse is very irregular dropping into 20's and back up to 80's and 90's occasionally going as high as 200 but dropping right down, family is concerned and I have started oxygen at 2 litre's which seems to stabilize her for a while and it started back. Will notify attending.

## 2015-01-18 NOTE — ED Notes (Signed)
Pt taken to CT scan.

## 2015-01-18 NOTE — H&P (Signed)
[facility] History and Physical  Angela PavlovRachel E Keiper ZOX:[id number] DOB: 03-Apr-1930 DOA: 01/18/2015  Referring physician: EDP PCP: Pearla DubonnetGATES,[name], MD   Chief Complaint: Intermittent L sided weakness and facial droop   HPI: Angela Keith is a 81 y.o. female with PMH significant for HTN, HLD, presumed CVA in past which is s/p TPA but with negative MRI.  Patient underwent uncomplicated lumbar decompressive laminectomy with Dr. Jordan LikesPool yesterday.  After going home in the afternoon she developed intermittent episodes of L sided weakness and facial droop.  Weakness involves both upper and lower extremities.  Initially in the ED her exam findings were unremarkable; however, as I was going to admit the patient, I noted that she had developed increased tone, decorticate posturing, and facial droop, all of this on the left side.  Dr. Leroy Kennedyamilo has been notified and seen the patient and changes in neuro exam.  Review of Systems: Systems reviewed.  As above, otherwise negative  Past Medical History  Diagnosis Date  . Hypothyroidism   . Arthritis   . GERD (gastroesophageal reflux disease)   . Dysphagia   . Hyperlipidemia   . Right bundle branch block 03/21/2014  . HTN (hypertension) 10/30/2012  . Unspecified hypothyroidism 10/30/2012  . Severe hearing loss   . DJD (degenerative joint disease), lumbar   . Rotoscoliosis   . Glaucoma   . DJD (degenerative joint disease), cervical   . Chronic neck pain   . Nocturia   . Insomnia   . Stroke   . Shortness of breath dyspnea    Past Surgical History  Procedure Laterality Date  . Breast surgery    . Back surgery      lower back   . Esophagogastroduodenoscopy (egd) with esophageal dilation  06/08/2012    Procedure: ESOPHAGOGASTRODUODENOSCOPY (EGD) WITH ESOPHAGEAL DILATION;  Surgeon: Charolett BumpersMartin K Johnson, MD;  Location: [facility];  Service: Endoscopy;  Laterality: N/A;  . Bladder laser surgery    . Left carpal tunnel surgery    . Facail surgery   2000    facelift and eyelid lift  . Cataract surgery     Social History:  reports that she has never smoked. She does not have any smokeless tobacco history on file. She reports that she does not drink alcohol or use illicit drugs.  Allergies  Allergen Reactions  . Ace Inhibitors Other (See Comments)    Severe fatigue  . Lipitor [Atorvastatin] Other (See Comments)    Mood swings  . Motrin [Ibuprofen] Nausea And Vomiting    Family History  Problem Relation Age of Onset  . CAD Father   . Stroke Father   . CAD Brother   . Diabetes Mother   . Lung cancer Other      Prior to Admission medications   Medication Sig Start Date End Date Taking? Authorizing Provider  amLODipine (NORVASC) 5 MG tablet Take 5 mg by mouth daily.    Yes Historical Provider, MD  Bilberry 1000 MG CAPS Take 100 mg by mouth daily.   Yes Historical Provider, MD  calcium carbonate (OS-CAL) 600 MG TABS Take 600 mg by mouth daily.   Yes Historical Provider, MD  Coenzyme Q10 (COQ10) 200 MG CAPS Take 200 mg by mouth daily.   Yes Historical Provider, MD  dorzolamide-timolol (COSOPT) 22.3-6.8 MG/ML ophthalmic solution Place 1 drop into both eyes 2 (two) times daily.   Yes Historical Provider, MD  Glucosamine-Chondroitin (MOVE FREE PO) Take 1 tablet by mouth 2 (two) times daily.  Yes Historical Provider, MD  HYDROcodone-acetaminophen (NORCO/VICODIN) 5-325 MG per tablet Take 1-2 tablets by mouth every 4 (four) hours as needed (mild pain). Patient taking differently: Take 0.5-2 tablets by mouth every 4 (four) hours as needed (mild pain).  01/17/15  Yes [name], MD  latanoprost (XALATAN) 0.005 % ophthalmic solution Place 1 drop into both eyes at bedtime.   Yes Historical Provider, MD  levothyroxine (SYNTHROID, LEVOTHROID) 88 MCG tablet Take 88 mcg by mouth daily before breakfast.   Yes Historical Provider, MD  losartan-hydrochlorothiazide (HYZAAR) 50-12.5 MG per tablet Take 1 tablet by mouth daily.   Yes Historical Provider,  MD  metoprolol tartrate (LOPRESSOR) 25 MG tablet Take 25 mg by mouth 2 (two) times daily.   Yes Historical Provider, MD  Multiple Vitamins-Minerals (ICAPS PO) Take 1 tablet by mouth daily.    Yes Historical Provider, MD  nitroGLYCERIN (NITROSTAT) 0.4 MG SL tablet Place 0.4 mg under the tongue as directed.   Yes Historical Provider, MD  pantoprazole (PROTONIX) 40 MG tablet Take 40 mg by mouth 2 (two) times daily.   Yes Historical Provider, MD  pravastatin (PRAVACHOL) 40 MG tablet Take 1 tablet (40 mg total) by mouth daily at 6 PM. 09/07/14  Yes Layne Benton, NP  clopidogrel (PLAVIX) 75 MG tablet Take 1 tablet (75 mg total) by mouth daily. 09/07/14   Layne Benton, NP   Physical Exam: Filed Vitals:   01/18/15 0530  BP: 127/58  Pulse: 81  Temp:   Resp: 17    BP 127/58 mmHg  Pulse 81  Temp(Src) 98.3 F (36.8 C) (Oral)  Resp 17  SpO2 92%  General Appearance:    Alert, oriented, no distress, appears stated age  Head:    Normocephalic, atraumatic  Eyes:    PERRL, EOMI, sclera non-icteric        Nose:   Nares without drainage or epistaxis. Mucosa, turbinates normal  Throat:   Moist mucous membranes. Oropharynx without erythema or exudate.  Neck:   Supple. No carotid bruits.  No thyromegaly.  No lymphadenopathy.   Back:     No CVA tenderness, no spinal tenderness  Lungs:     Clear to auscultation bilaterally, without wheezes, rhonchi or rales  Chest wall:    No tenderness to palpitation  Heart:    Regular rate and rhythm without murmurs, gallops, rubs  Abdomen:     Soft, non-tender, nondistended, normal bowel sounds, no organomegaly  Genitalia:    deferred  Rectal:    deferred  Extremities:   No clubbing, cyanosis or edema.  Pulses:   2+ and symmetric all extremities  Skin:   Skin color, texture, turgor normal, no rashes or lesions  Lymph nodes:   Cervical, supraclavicular, and axillary nodes normal  Neurologic:   Patient has 5/5 strength on the R side, on the L side she has increased  tone in [name] and LLE, decorticate positioning of [name] and LLE.  Is able to show some movement of these extremities with great difficulty but does not have total range of motion or voluntary movement.  Does have L sided facial droop as well.  Has intermittent muscle spasms / fasciculations of the RLE but no other findings on the R side.    Labs on Admission:  Basic Metabolic Panel:  Recent Labs Lab 01/11/15 1333 01/18/15 0341 01/18/15 0349  NA 137 134* 136  K 3.7 3.9 3.8  CL 101 99* 98*  CO2 29 27  --   GLUCOSE  102* 118* 115*  BUN 19 14 16   CREATININE 1.06* 1.06* 1.00  CALCIUM 9.8 9.6  --    Liver Function Tests:  Recent Labs Lab 01/18/15 0341  AST 27  ALT 17  ALKPHOS 47  BILITOT 0.7  PROT 6.7  ALBUMIN 3.8   No results for input(s): LIPASE, AMYLASE in the last 168 hours. No results for input(s): AMMONIA in the last 168 hours. CBC:  Recent Labs Lab 01/11/15 1333 01/18/15 0341 01/18/15 0349  WBC 6.5 10.9*  --   NEUTROABS 3.3 7.9*  --   HGB 13.0 11.5* 13.3  HCT 40.4 34.8* 39.0  MCV 87.4 85.3  --   PLT 261 224  --    Cardiac Enzymes: No results for input(s): CKTOTAL, CKMB, CKMBINDEX, TROPONINI in the last 168 hours.  BNP (last 3 results) No results for input(s): PROBNP in the last 8760 hours. CBG: No results for input(s): GLUCAP in the last 168 hours.  Radiological Exams on Admission: Dg Chest 2 View  01/18/2015   CLINICAL DATA:  Shortness of breath. Hypertension. Postoperative numbness.  EXAM: CHEST  2 VIEW  COMPARISON:  07/17/2009  FINDINGS: Shallow inspiration. Normal heart size and pulmonary vascularity. No focal airspace disease or consolidation in the lungs. No blunting of costophrenic angles. No pneumothorax. Calcified and tortuous aorta. Bilateral breast implants. Degenerative changes in the spine.  IMPRESSION: Shallow inspiration.  No evidence of active pulmonary disease.   Electronically Signed   By: Burman Nieves M.D.   On: 01/18/2015 04:55   Ct Head  Wo Contrast  01/18/2015   CLINICAL DATA:  TIA symptoms. Patient has surgery yesterday and was released. Family noticed left-sided facial droop and left-sided weakness of the upper and lower extremities around 1600 hours. Symptoms on off since then.  EXAM: CT HEAD WITHOUT CONTRAST  TECHNIQUE: Contiguous axial images were obtained from the base of the skull through the vertex without intravenous contrast.  COMPARISON:  MRI brain 09/06/2014.  CT head 09/05/2014.  FINDINGS: Diffuse cerebral atrophy. Low-attenuation changes in the deep white matter consistent with small vessel ischemia. Old lacune in the left basal ganglia. No mass effect or midline shift. No abnormal extra-axial fluid collections. Gray-white matter junctions are distinct. Basal cisterns are not effaced. No evidence of acute intracranial hemorrhage. No depressed skull fractures. Visualized paranasal sinuses and mastoid air cells are not opacified. Vascular calcifications.  IMPRESSION: No acute intracranial abnormalities. Chronic atrophy and small vessel ischemic changes. Old lacunar infarct in the left basal ganglia. No change since prior study.   Electronically Signed   By: Burman Nieves M.D.   On: 01/18/2015 04:49   Dg Lumbar Spine 1 View  01/17/2015   CLINICAL DATA:  Lumbar spine surgery.  EXAM: LUMBAR SPINE - 1 VIEW  COMPARISON:  MRI 12/21/2014 .  FINDINGS: Posterior and interbody fusion L4-L5. Metallic marker is noted along the posterior aspect of L4 . Lumbar vertebra numbered as per prior MRI of 12/21/2014.  IMPRESSION: Metallic marker noted along the posterior aspect of L4.   Electronically Signed   By: Maisie Fus  Register   On: 01/17/2015 09:58    EKG: Independently reviewed.  Assessment/Plan Principal Problem:   Left-sided weakness Active Problems:   HTN (hypertension)   Hemispheric carotid artery syndrome   HLD (hyperlipidemia)   TIA (transient ischemic attack)   1. Left sided weakness - increased tone in [name]/LLE, decorticate  positioning, and L sided facial droop 1. With changing neurologic findings during ED stay, notified Dr. Leroy Kennedy of  the change in neuro exam, he re-evaluated patient at bedside and saw these findings which have changed since his initial exam. 2. MRI/MRA brain 3. No anticoagulants ordered due to CNS surgery less than 24 hours ago (SCDs for DVT ppx). 1. Not candidate for TPA 2. Neurology to d/w neurosurgery when we can resume plavix 4. If MRI brain does not show stroke, consider ordering meds for partial seizure treatment if neuro agrees. 5. Given increased tone and spasm of RLE, if MRI does show stroke, would order muscle relaxants (holding off on this for now pending results of MRI). 2. HTN - 1. Continue home meds for now 2. If MRI brain shows stroke, consider permissive HTN 3. HLD - continue home meds  Dr. Leroy Kennedy is on board, has seen the patient, and has seen the changes in neuro exam here in the ED.  He added on an MRA in addition to MRI brain.  Code Status: Full  Family Communication: Family at bedside Disposition Plan: Admit to inpatient   Time spent: 70 min  GARDNER, JARED M. [facility] Pager [phone]  If 7AM-7PM, please contact the day team taking care of the patient [email or web address] Password [facility] 01/18/2015, 6:40 AM

## 2015-01-18 NOTE — ED Notes (Addendum)
Per [facility], pt from home for TIA symptoms. Pt had a spinal surgery yesterday and was released. Family noticed she started having left sided facial droop and left sided weakness of upper and lower extremities around 1600 but kept going home. Told EMS she has been having symptoms off and on since then. They will resolve and then come back. Also has spasms in her right arm. 20g to RAC. Pt is alert and oriented. Pt states she was having to drag her left leg some before the surgery and now after it her left arm has been weak. Has been off her Plavix for 4 days.

## 2015-01-19 ENCOUNTER — Inpatient Hospital Stay (HOSPITAL_COMMUNITY): Payer: Medicare Other

## 2015-01-19 ENCOUNTER — Encounter (HOSPITAL_COMMUNITY)
Admission: EM | Disposition: A | Discharge status: Rehabilitation Facility | Payer: Self-pay | Source: Home / Self Care | Attending: Internal Medicine

## 2015-01-19 ENCOUNTER — Encounter (HOSPITAL_COMMUNITY): Payer: Self-pay | Admitting: *Deleted

## 2015-01-19 DIAGNOSIS — I639 Cerebral infarction, unspecified: Secondary | ICD-10-CM | POA: Diagnosis present

## 2015-01-19 DIAGNOSIS — Z8673 Personal history of transient ischemic attack (TIA), and cerebral infarction without residual deficits: Secondary | ICD-10-CM | POA: Insufficient documentation

## 2015-01-19 HISTORY — PX: TEE WITHOUT CARDIOVERSION: SHX5443

## 2015-01-19 HISTORY — PX: EP IMPLANTABLE DEVICE: SHX172B

## 2015-01-19 LAB — BASIC METABOLIC PANEL
Anion gap: 10 (ref 5–15)
BUN: 11 mg/dL (ref 6–20)
CALCIUM: 9 mg/dL (ref 8.9–10.3)
CHLORIDE: 98 mmol/L — AB (ref 101–111)
CO2: 25 mmol/L (ref 22–32)
Creatinine, Ser: 0.82 mg/dL (ref 0.44–1.00)
GLUCOSE: 121 mg/dL — AB (ref 65–99)
Potassium: 3.6 mmol/L (ref 3.5–5.1)
SODIUM: 133 mmol/L — AB (ref 135–145)

## 2015-01-19 LAB — LIPID PANEL
Cholesterol: 122 mg/dL (ref 0–200)
HDL: 52 mg/dL (ref >40)
LDL Cholesterol: 66 mg/dL (ref 0–99)
TRIGLYCERIDES: 20 mg/dL (ref <150)
Total CHOL/HDL Ratio: 2.3 RATIO
VLDL: 4 mg/dL (ref 0–40)

## 2015-01-19 LAB — CREATININE, SERUM
Creatinine, Ser: 0.83 mg/dL (ref 0.44–1.00)
GFR calc Af Amer: >60 mL/min (ref >60)
GFR calc non Af Amer: >60 mL/min (ref >60)

## 2015-01-19 LAB — CBC
HCT: 36.6 % (ref 36.0–46.0)
Hemoglobin: 12.3 g/dL (ref 12.0–15.0)
MCH: 29.1 pg (ref 26.0–34.0)
MCHC: 33.6 g/dL (ref 30.0–36.0)
MCV: 86.5 fL (ref 78.0–100.0)
Platelets: 224 10*3/uL (ref 150–400)
RBC: 4.23 MIL/uL (ref 3.87–5.11)
RDW: 13.9 % (ref 11.5–15.5)
WBC: 10.3 10*3/uL (ref 4.0–10.5)

## 2015-01-19 LAB — PROTIME-INR
INR: 1.11 (ref 0.00–1.49)
Prothrombin Time: 14.5 seconds (ref 11.6–15.2)

## 2015-01-19 SURGERY — ECHOCARDIOGRAM, TRANSESOPHAGEAL
Anesthesia: Moderate Sedation

## 2015-01-19 SURGERY — LOOP RECORDER INSERTION
Anesthesia: LOCAL

## 2015-01-19 MED ORDER — HEPARIN SODIUM (PORCINE) 5000 UNIT/ML IJ SOLN
5000.0000 [IU] | Freq: Three times a day (TID) | INTRAMUSCULAR | Status: DC
  Administered 2015-01-19 – 2015-01-20 (×4): 5000 [IU] via SUBCUTANEOUS
  Filled 2015-01-19 (×4): qty 1

## 2015-01-19 MED ORDER — FENTANYL CITRATE (PF) 100 MCG/2ML IJ SOLN
INTRAMUSCULAR | Status: DC | PRN
  Administered 2015-01-19: 25 ug via INTRAVENOUS

## 2015-01-19 MED ORDER — SODIUM CHLORIDE 0.9 % IV SOLN: INTRAVENOUS | Status: DC

## 2015-01-19 MED ORDER — LIDOCAINE-EPINEPHRINE 1 %-1:100000 IJ SOLN
INTRAMUSCULAR | Status: DC | PRN
  Administered 2015-01-19: 20 mL via INTRADERMAL

## 2015-01-19 MED ORDER — LIDOCAINE VISCOUS 2 % MT SOLN
OROMUCOSAL | Status: AC
  Filled 2015-01-19: qty 15

## 2015-01-19 MED ORDER — MIDAZOLAM HCL 10 MG/2ML IJ SOLN
INTRAMUSCULAR | Status: DC | PRN
  Administered 2015-01-19 (×3): 1 mg via INTRAVENOUS

## 2015-01-19 MED ORDER — MIDAZOLAM HCL 5 MG/ML IJ SOLN
INTRAMUSCULAR | Status: AC
  Filled 2015-01-19: qty 2

## 2015-01-19 MED ORDER — BUTAMBEN-TETRACAINE-BENZOCAINE 2-2-14 % EX AERO
INHALATION_SPRAY | CUTANEOUS | Status: DC | PRN
  Administered 2015-01-19: 2 via TOPICAL

## 2015-01-19 MED ORDER — ACETAMINOPHEN 325 MG PO TABS: 325.0000 mg | ORAL_TABLET | ORAL | Status: DC | PRN

## 2015-01-19 MED ORDER — FENTANYL CITRATE (PF) 100 MCG/2ML IJ SOLN
INTRAMUSCULAR | Status: AC
  Filled 2015-01-19: qty 2

## 2015-01-19 MED ORDER — ONDANSETRON HCL 4 MG/2ML IJ SOLN: 4.0000 mg | Freq: Four times a day (QID) | INTRAMUSCULAR | Status: DC | PRN

## 2015-01-19 SURGICAL SUPPLY — 2 items
LOOP REVEAL LINQSYS (Prosthesis & Implant Heart) ×1 IMPLANT
PACK LOOP INSERTION (CUSTOM PROCEDURE TRAY) ×2 IMPLANT

## 2015-01-19 NOTE — Consult Note (Signed)
Physical Medicine and Rehabilitation Consult Reason for Consult: Right ACA and punctate left thalamic infarcts Referring Physician: [facility]   HPI: Angela Keith is a 81 y.o. right handed female with history of hypertension, TIA versus CVA March 2016 with MRI negative and patient was placed on dual antiplatelets therapy with carotid Dopplers and echocardiogram unremarkable and recent lumbar L2-3, 3-4 decompressive laminectomy for lumbar stenosis with radiculopathy 01/17/2015 per Dr. Jordan Likes and discharged to home same day. Presented 01/18/2015 a left sided weakness. MRI/MRA of the brain showed right distal ACA segment occlusion with acute posterior right ACA infarct as well as punctate acute infarct medial left thalamus with no mass effect. Patient did not receive TPA. Cardiology consulted with attempts at TEE but was aborted because they were unable to pass the ultrasound probe after several attempts. Underwent loop recorder placement 01/19/2015.. Currently maintained on Plavix for CVA prophylaxis. Physical occupational therapy evaluations completed recommendations of physical medicine rehabilitation consult. Patient does wear a back brace for comfort after recent back surgery.   Review of Systems  Constitutional: Negative for fever and chills.  HENT: Positive for hearing loss.   Eyes: Negative for blurred vision and double vision.  Respiratory:       Shortness of breath with exertion  Cardiovascular: Negative for chest pain and palpitations.  Gastrointestinal: Positive for heartburn and constipation.       GERD  Genitourinary: Negative for urgency.  Musculoskeletal: Positive for back pain and neck pain.  Skin: Negative for rash.  Neurological: Positive for weakness and headaches.  Psychiatric/Behavioral: The patient has insomnia.    Past Medical History  Diagnosis Date  . Hypothyroidism   . Arthritis   . GERD (gastroesophageal reflux disease)   . Dysphagia   . Hyperlipidemia   .  Right bundle branch block 03/21/2014  . HTN (hypertension) 10/30/2012  . Unspecified hypothyroidism 10/30/2012  . Severe hearing loss   . DJD (degenerative joint disease), lumbar   . Rotoscoliosis   . Glaucoma   . DJD (degenerative joint disease), cervical   . Chronic neck pain   . Nocturia   . Insomnia   . Stroke   . Shortness of breath dyspnea    Past Surgical History  Procedure Laterality Date  . Breast surgery    . Back surgery      lower back   . Esophagogastroduodenoscopy (egd) with esophageal dilation  06/08/2012    Procedure: ESOPHAGOGASTRODUODENOSCOPY (EGD) WITH ESOPHAGEAL DILATION;  Surgeon: Charolett Bumpers, MD;  Location: [facility];  Service: Endoscopy;  Laterality: N/A;  . Bladder laser surgery    . Left carpal tunnel surgery    . Facail surgery  2000    facelift and eyelid lift  . Cataract surgery    . Lumbar laminectomy/decompression microdiscectomy N/A 01/17/2015    Procedure: Laminectomy and Foraminotomy - L2-L3 - L3-L4;  Surgeon: [name], MD;  Location: [facility];  Service: Neurosurgery;  Laterality: N/A;  Laminectomy and Foraminotomy - L2-L3 - L3-L4   Family History  Problem Relation Age of Onset  . CAD Father   . Stroke Father   . CAD Brother   . Diabetes Mother   . Lung cancer Other    Social History:  reports that she has never smoked. She does not have any smokeless tobacco history on file. She reports that she does not drink alcohol or use illicit drugs. Allergies:  Allergies  Allergen Reactions  . Ace Inhibitors Other (See Comments)  Severe fatigue  . Lipitor [Atorvastatin] Other (See Comments)    Mood swings  . Motrin [Ibuprofen] Nausea And Vomiting   Medications Prior to Admission  Medication Sig Dispense Refill  . amLODipine (NORVASC) 5 MG tablet Take 5 mg by mouth daily.     . Bilberry 1000 MG CAPS Take 100 mg by mouth daily.    . calcium carbonate (OS-CAL) 600 MG TABS Take 600 mg by mouth daily.    . Coenzyme Q10 (COQ10) 200 MG  CAPS Take 200 mg by mouth daily.    . dorzolamide-timolol (COSOPT) 22.3-6.8 MG/ML ophthalmic solution Place 1 drop into both eyes 2 (two) times daily.    . Glucosamine-Chondroitin (MOVE FREE PO) Take 1 tablet by mouth 2 (two) times daily.    Marland Kitchen HYDROcodone-acetaminophen (NORCO/VICODIN) 5-325 MG per tablet Take 1-2 tablets by mouth every 4 (four) hours as needed (mild pain). (Patient taking differently: Take 0.5-2 tablets by mouth every 4 (four) hours as needed (mild pain). ) 30 tablet 0  . latanoprost (XALATAN) 0.005 % ophthalmic solution Place 1 drop into both eyes at bedtime.    Marland Kitchen levothyroxine (SYNTHROID, LEVOTHROID) 88 MCG tablet Take 88 mcg by mouth daily before breakfast.    . losartan-hydrochlorothiazide (HYZAAR) 50-12.5 MG per tablet Take 1 tablet by mouth daily.    . metoprolol tartrate (LOPRESSOR) 25 MG tablet Take 25 mg by mouth 2 (two) times daily.    . Multiple Vitamins-Minerals (ICAPS PO) Take 1 tablet by mouth daily.     . nitroGLYCERIN (NITROSTAT) 0.4 MG SL tablet Place 0.4 mg under the tongue as directed.    . pantoprazole (PROTONIX) 40 MG tablet Take 40 mg by mouth 2 (two) times daily.    . pravastatin (PRAVACHOL) 40 MG tablet Take 1 tablet (40 mg total) by mouth daily at 6 PM. 30 tablet 2  . clopidogrel (PLAVIX) 75 MG tablet Take 1 tablet (75 mg total) by mouth daily. 30 tablet 2    Home: Home Living Family/patient expects to be discharged to:: Private residence Living Arrangements: Alone Type of Home: House Home Access: Level entry Home Layout: Able to live on main level with bedroom/bathroom Bathroom Shower/Tub: Tub/shower unit, Curtain Bathroom Toilet: Handicapped height Home Equipment: Environmental consultant - 2 wheels, Wheelchair - manual, Shower seat  Functional History: Prior Function Level of Independence: Independent Functional Status:  Mobility: Bed Mobility Overal bed mobility: Needs Assistance Bed Mobility: Rolling, Sidelying to Sit, Sit to Sidelying Rolling: Mod  assist, +2 for physical assistance Sidelying to sit: Max assist Sit to sidelying: Max assist, +2 for physical assistance General bed mobility comments: Patient able to initiate rolling with Right side but requires assist for positioning and rolling to side lying, maximial assist to power to upright (some pushing with RUE noted) and assist for trunk and LEs to return to sidelying and reposition Transfers General transfer comment: unable to perform Ambulation/Gait General Gait Details: unable to perform    ADL: ADL Overall ADL's : Needs assistance/impaired Eating/Feeding: NPO Grooming: Moderate assistance, Sitting (supported sitting) Upper Body Bathing: Moderate assistance, Sitting (supported sitting) Lower Body Bathing: Maximal assistance, Sit to/from stand, Adhering to back precautions Upper Body Dressing : Moderate assistance, Sitting Lower Body Dressing: Maximal assistance, Sit to/from stand, Adhering to back precautions Toilet Transfer: Maximal assistance, +2 for physical assistance Toileting- Clothing Manipulation and Hygiene: Sit to/from stand, Moderate assistance General ADL Comments: Pt limited by back pain  Cognition: Cognition Overall Cognitive Status: Difficult to assess Orientation Level: Oriented X4 Cognition Arousal/Alertness: Awake/alert  Behavior During Therapy: Flat affect Overall Cognitive Status: Difficult to assess Area of Impairment: Attention, Problem solving Current Attention Level: Focused Memory: Decreased recall of precautions, Decreased short-term memory Problem Solving: Slow processing, Decreased initiation, Difficulty sequencing, Requires verbal cues, Requires tactile cues General Comments: Patient extremely hard of hearing and easily distracted throughout session Difficult to assess due to: Hard of hearing/deaf  Blood pressure 142/88, pulse 80, temperature 98.7 F (37.1 C), temperature source Oral, resp. rate 20, SpO2 100 %. Physical Exam    Constitutional:  81 year old right-handed female that is severely hard of hearing of bilateral hearing aids.  Eyes: EOM are normal.  Neck: Normal range of motion. Neck supple. No thyromegaly present.  Cardiovascular: Normal rate and regular rhythm.   Respiratory: Effort normal and breath sounds normal. No respiratory distress.  GI: Soft. Bowel sounds are normal. She exhibits no distension.  Neurological: She is alert.  Patient did not have her hearing aids in place during exam. She was able to provide her name and place. Follows simple commands. HOH. [name] spastic with underlying 1-2/5 strength. LLE: 1-2 hf, ke and 1 ankle. Likely some apraxia as well as inattention to left also.  Skin: Skin is warm and dry.  Psychiatric: She has a normal mood and affect.    Results for orders placed or performed during the hospital encounter of 01/18/15 (from the past 24 hour(s))  Lipid panel     Status: None   Collection Time: 01/19/15  4:52 AM  Result Value Ref Range   Cholesterol 122 0 - 200 mg/dL   Triglycerides 20 <161 mg/dL   HDL 52 >09 mg/dL   Total CHOL/HDL Ratio 2.3 RATIO   VLDL 4 0 - 40 mg/dL   LDL Cholesterol 66 0 - 99 mg/dL  Basic metabolic panel     Status: Abnormal   Collection Time: 01/19/15  4:52 AM  Result Value Ref Range   Sodium 133 (L) 135 - 145 mmol/L   Potassium 3.6 3.5 - 5.1 mmol/L   Chloride 98 (L) 101 - 111 mmol/L   CO2 25 22 - 32 mmol/L   Glucose, Bld 121 (H) 65 - 99 mg/dL   BUN 11 6 - 20 mg/dL   Creatinine, Ser 6.04 0.44 - 1.00 mg/dL   Calcium 9.0 8.9 - 54.0 mg/dL   GFR calc non Af Amer >60 >60 mL/min   GFR calc Af Amer >60 >60 mL/min   Anion gap 10 5 - 15  Protime-INR     Status: None   Collection Time: 01/19/15  4:52 AM  Result Value Ref Range   Prothrombin Time 14.5 11.6 - 15.2 seconds   INR 1.11 0.00 - 1.49   Dg Chest 2 View  01/18/2015   CLINICAL DATA:  Shortness of breath. Hypertension. Postoperative numbness.  EXAM: CHEST  2 VIEW  COMPARISON:  07/17/2009   FINDINGS: Shallow inspiration. Normal heart size and pulmonary vascularity. No focal airspace disease or consolidation in the lungs. No blunting of costophrenic angles. No pneumothorax. Calcified and tortuous aorta. Bilateral breast implants. Degenerative changes in the spine.  IMPRESSION: Shallow inspiration.  No evidence of active pulmonary disease.   Electronically Signed   By: Burman Nieves M.D.   On: 01/18/2015 04:55   Ct Head Wo Contrast  01/18/2015   CLINICAL DATA:  TIA symptoms. Patient has surgery yesterday and was released. Family noticed left-sided facial droop and left-sided weakness of the upper and lower extremities around 1600 hours. Symptoms on off since then.  EXAM: CT HEAD WITHOUT CONTRAST  TECHNIQUE: Contiguous axial images were obtained from the base of the skull through the vertex without intravenous contrast.  COMPARISON:  MRI brain 09/06/2014.  CT head 09/05/2014.  FINDINGS: Diffuse cerebral atrophy. Low-attenuation changes in the deep white matter consistent with small vessel ischemia. Old lacune in the left basal ganglia. No mass effect or midline shift. No abnormal extra-axial fluid collections. Gray-white matter junctions are distinct. Basal cisterns are not effaced. No evidence of acute intracranial hemorrhage. No depressed skull fractures. Visualized paranasal sinuses and mastoid air cells are not opacified. Vascular calcifications.  IMPRESSION: No acute intracranial abnormalities. Chronic atrophy and small vessel ischemic changes. Old lacunar infarct in the left basal ganglia. No change since prior study.   Electronically Signed   By: Burman Nieves M.D.   On: 01/18/2015 04:49   Mr Shirlee Latch ZO Contrast  01/18/2015   ADDENDUM REPORT: 01/18/2015 07:56  ADDENDUM: Study discussed by telephone with Dr. Thana Farr on 01/18/2015 at 0744 hours.   Electronically Signed   By: Odessa Fleming M.D.   On: 01/18/2015 07:56   01/18/2015   CLINICAL DATA:  81 year old female status post recent  lumbar surgery with acute left side weakness and facial droop. Initial encounter.  EXAM: MRI HEAD WITHOUT CONTRAST  MRA HEAD WITHOUT CONTRAST  TECHNIQUE: Multiplanar, multiecho pulse sequences of the brain and surrounding structures were obtained without intravenous contrast. Angiographic images of the head were obtained using MRA technique without contrast.  COMPARISON:  Head CT without contrast 0418 hours today. Brain MRI and intracranial MRA 09/06/2014.  FINDINGS: MRI HEAD FINDINGS  Confluent 2.5 cm area of restricted diffusion in the posterior right ACA territory, with patchy involvement of the medial motor strip. Patchy right cingulate gyrus involvement. Subtle associated T2 and FLAIR hyperintensity. No definite associated hemorrhage. No mass effect.  Superimposed punctate restricted diffusion along the medial left thalamus (series 7, image 14). No other left hemisphere restricted diffusion identified. No posterior fossa restricted diffusion identified.  Major intracranial vascular flow voids are stable at the skullbase. Loss of the distal right ACA flow void with associated increased FLAIR signal, see MRA findings below.  Scattered round areas of T2* susceptibility appears related to the subarachnoid space, and corresponds to several small foci of very low density (negative 200-300 Hounsfield units) on the head CT ear earlier today.  Stable gray and white matter signal elsewhere. No ventriculomegaly. No midline shift. Basilar cisterns are patent. No extra-axial collection. No intracranial mass lesion. Pituitary and cervicomedullary junction appear stable. Normal bone marrow signal.  Internal auditory structures, paranasal sinuses, mastoids, orbits, and scalp soft tissues are stable.  MRA HEAD FINDINGS  Stable antegrade flow in the distal vertebral arteries. No distal vertebral or basilar artery stenosis. PICA origins remain patent. Stable SCA and PCA origins. Stable bilateral PCA branches, with mild to  moderate irregularity and stenosis of the right P2 segment but preserved distal flow. Posterior communicating arteries are diminutive or absent.  Stable antegrade flow in both ICA siphons. Carotid termini remain patent. MCA and ACA origins are stable and within normal limits.  Increased irregularity of the bilateral MCA M1 segments and right ACA A1 segment, might in part be related to mild motion artifact today. Evidence of moderate to severe stenosis now in the left M1 segment. However, both MCA bifurcations remain patent. No M2 branch occlusion is identified, with bilateral M2 branch irregularity re- identified.  The left ACA appears stable. The right ACA A1 segment is mildly  irregular, an the right A2 segment is occluded in the pericallosal artery region. See series 4, images 118-123).  IMPRESSION: 1. Right ACA distal A2 segment occlusion with acute posterior right ACA infarct, involving some of the medial motor strip. Mild cytotoxic edema without hemorrhage or mass effect. 2. Increased left worse than right MCA M1 segment irregularity may reflect new thromboembolic disease. No major MCA branch occlusion. 3. Punctate acute infarct in the medial left thalamus with no mass effect or hemorrhage. The posterior circulation appears stable since March. 4. Scattered small areas of susceptibility appear related to small very low density foci in the subarachnoid space on this CT earlier today. Favor trace pneumocephalus, presumably arising from the recent lumbar surgery.  Electronically Signed: By: Odessa FlemingH  Hall M.D. On: 01/18/2015 07:39   Mr Brain Wo Contrast  01/18/2015   ADDENDUM REPORT: 01/18/2015 07:56  ADDENDUM: Study discussed by telephone with Dr. Thana FarrLeslie Reynolds on 01/18/2015 at 0744 hours.   Electronically Signed   By: Odessa FlemingH  Hall M.D.   On: 01/18/2015 07:56   01/18/2015   CLINICAL DATA:  81 year old female status post recent lumbar surgery with acute left side weakness and facial droop. Initial encounter.  EXAM: MRI  HEAD WITHOUT CONTRAST  MRA HEAD WITHOUT CONTRAST  TECHNIQUE: Multiplanar, multiecho pulse sequences of the brain and surrounding structures were obtained without intravenous contrast. Angiographic images of the head were obtained using MRA technique without contrast.  COMPARISON:  Head CT without contrast 0418 hours today. Brain MRI and intracranial MRA 09/06/2014.  FINDINGS: MRI HEAD FINDINGS  Confluent 2.5 cm area of restricted diffusion in the posterior right ACA territory, with patchy involvement of the medial motor strip. Patchy right cingulate gyrus involvement. Subtle associated T2 and FLAIR hyperintensity. No definite associated hemorrhage. No mass effect.  Superimposed punctate restricted diffusion along the medial left thalamus (series 7, image 14). No other left hemisphere restricted diffusion identified. No posterior fossa restricted diffusion identified.  Major intracranial vascular flow voids are stable at the skullbase. Loss of the distal right ACA flow void with associated increased FLAIR signal, see MRA findings below.  Scattered round areas of T2* susceptibility appears related to the subarachnoid space, and corresponds to several small foci of very low density (negative 200-300 Hounsfield units) on the head CT ear earlier today.  Stable gray and white matter signal elsewhere. No ventriculomegaly. No midline shift. Basilar cisterns are patent. No extra-axial collection. No intracranial mass lesion. Pituitary and cervicomedullary junction appear stable. Normal bone marrow signal.  Internal auditory structures, paranasal sinuses, mastoids, orbits, and scalp soft tissues are stable.  MRA HEAD FINDINGS  Stable antegrade flow in the distal vertebral arteries. No distal vertebral or basilar artery stenosis. PICA origins remain patent. Stable SCA and PCA origins. Stable bilateral PCA branches, with mild to moderate irregularity and stenosis of the right P2 segment but preserved distal flow. Posterior  communicating arteries are diminutive or absent.  Stable antegrade flow in both ICA siphons. Carotid termini remain patent. MCA and ACA origins are stable and within normal limits.  Increased irregularity of the bilateral MCA M1 segments and right ACA A1 segment, might in part be related to mild motion artifact today. Evidence of moderate to severe stenosis now in the left M1 segment. However, both MCA bifurcations remain patent. No M2 branch occlusion is identified, with bilateral M2 branch irregularity re- identified.  The left ACA appears stable. The right ACA A1 segment is mildly irregular, an the right A2 segment is occluded in the  pericallosal artery region. See series 4, images 118-123).  IMPRESSION: 1. Right ACA distal A2 segment occlusion with acute posterior right ACA infarct, involving some of the medial motor strip. Mild cytotoxic edema without hemorrhage or mass effect. 2. Increased left worse than right MCA M1 segment irregularity may reflect new thromboembolic disease. No major MCA branch occlusion. 3. Punctate acute infarct in the medial left thalamus with no mass effect or hemorrhage. The posterior circulation appears stable since March. 4. Scattered small areas of susceptibility appear related to small very low density foci in the subarachnoid space on this CT earlier today. Favor trace pneumocephalus, presumably arising from the recent lumbar surgery.  Electronically Signed: By: Odessa Fleming M.D. On: 01/18/2015 07:39    Assessment/Plan: Diagnosis: s/p lumbar decompression with post-op right ACA, left thalamic infarcts 1. Does the need for close, 24 hr/day medical supervision in concert with the patient's rehab needs make it unreasonable for this patient to be served in a less intensive setting? Yes 2. Co-Morbidities requiring supervision/potential complications: htn, RBBB 3. Due to bladder management, bowel management, safety, skin/wound care, disease management, medication administration, pain  management and patient education, does the patient require 24 hr/day rehab nursing? Yes 4. Does the patient require coordinated care of a physician, rehab nurse, PT (1-2 hrs/day, 5 days/week), OT (1-2 hrs/day, 5 days/week) and SLP (1-2 hrs/day, 5 days/week) to address physical and functional deficits in the context of the above medical diagnosis(es)? Yes Addressing deficits in the following areas: balance, endurance, locomotion, strength, transferring, bowel/bladder control, bathing, dressing, feeding, grooming, toileting, cognition, language, swallowing and psychosocial support 5. Can the patient actively participate in an intensive therapy program of at least 3 hrs of therapy per day at least 5 days per week? Yes 6. The potential for patient to make measurable gains while on inpatient rehab is good 7. Anticipated functional outcomes upon discharge from inpatient rehab are min assist  with PT, supervision and min assist with OT, supervision with SLP. 8. Estimated rehab length of stay to reach the above functional goals is: 18-24 days 9. Does the patient have adequate social supports and living environment to accommodate these discharge functional goals? Yes 10. Anticipated D/C setting: Home 11. Anticipated post D/C treatments: HH therapy and Outpatient therapy 12. Overall Rehab/Functional Prognosis: excellent  RECOMMENDATIONS: This patient's condition is appropriate for continued rehabilitative care in the following setting: CIR Patient has agreed to participate in recommended program. Yes Note that insurance prior authorization may be required for reimbursement for recommended care.  Comment: Rehab Admissions Coordinator to follow up.  Thanks,  Ranelle Oyster, MD, [place] [facility]     01/19/2015

## 2015-01-19 NOTE — Progress Notes (Signed)
[facility] PROGRESS NOTE  Angela PavlovRachel E Keith ZOX:[id number] DOB: 02/04/1930 DOA: 01/18/2015 PCP: Pearla DubonnetGATES,[name], MD  Assessment/Plan:  Principal Problem:   Cryptogenic stroke Active Problems:   HTN (hypertension)   Back pain with left-sided radiculopathy   Hemispheric carotid artery syndrome   HLD (hyperlipidemia)  Came by to see patient to evaluate twice and down for procedure. Discussed with rehabilitation medicine and stroke service. Workup underway. Chart reviewed.  Objective: Filed Vitals:   01/19/15 1059  BP: 142/88  Pulse: 80  Temp: 98.7 F (37.1 C)  Resp: 20    Intake/Output Summary (Last 24 hours) at 01/19/15 1348 Last data filed at 01/19/15 0536  Gross per 24 hour  Intake   1525 ml  Output   1350 ml  Net    175 ml   There were no vitals filed for this visit.  Exam: Patient not in room twice when I attempted to evaluate.  Basic Metabolic Panel:  Recent Labs Lab 01/18/15 0341 01/18/15 0349 01/19/15 0452  NA 134* 136 133*  K 3.9 3.8 3.6  CL 99* 98* 98*  CO2 27  --  25  GLUCOSE 118* 115* 121*  BUN 14 16 11   CREATININE 1.06* 1.00 0.82  CALCIUM 9.6  --  9.0   Liver Function Tests:  Recent Labs Lab 01/18/15 0341  AST 27  ALT 17  ALKPHOS 47  BILITOT 0.7  PROT 6.7  ALBUMIN 3.8   No results for input(s): LIPASE, AMYLASE in the last 168 hours. No results for input(s): AMMONIA in the last 168 hours. CBC:  Recent Labs Lab 01/18/15 0341 01/18/15 0349  WBC 10.9*  --   NEUTROABS 7.9*  --   HGB 11.5* 13.3  HCT 34.8* 39.0  MCV 85.3  --   PLT 224  --    Cardiac Enzymes: No results for input(s): CKTOTAL, CKMB, CKMBINDEX, TROPONINI in the last 168 hours. BNP (last 3 results) No results for input(s): BNP in the last 8760 hours.  ProBNP (last 3 results) No results for input(s): PROBNP in the last 8760 hours.  CBG: No results for input(s): GLUCAP in the last 168 hours.  Recent Results (from the past 240 hour(s))  Surgical pcr  screen     Status: None   Collection Time: 01/11/15  1:33 PM  Result Value Ref Range Status   MRSA, PCR NEGATIVE NEGATIVE Final   Staphylococcus aureus NEGATIVE NEGATIVE Final    Comment:        The Xpert SA Assay (FDA approved for NASAL specimens in patients over 79 years of age), is one component of a comprehensive surveillance program.  Test performance has been validated by [facility] for patients greater than or equal to 79 year old. It is not intended to diagnose infection nor to guide or monitor treatment.      Studies: Dg Chest 2 View  01/18/2015   CLINICAL DATA:  Shortness of breath. Hypertension. Postoperative numbness.  EXAM: CHEST  2 VIEW  COMPARISON:  07/17/2009  FINDINGS: Shallow inspiration. Normal heart size and pulmonary vascularity. No focal airspace disease or consolidation in the lungs. No blunting of costophrenic angles. No pneumothorax. Calcified and tortuous aorta. Bilateral breast implants. Degenerative changes in the spine.  IMPRESSION: Shallow inspiration.  No evidence of active pulmonary disease.   Electronically Signed   By: Burman NievesWilliam  Stevens M.D.   On: 01/18/2015 04:55   Ct Head Wo Contrast  01/18/2015   CLINICAL DATA:  TIA symptoms. Patient has surgery yesterday and  was released. Family noticed left-sided facial droop and left-sided weakness of the upper and lower extremities around 1600 hours. Symptoms on off since then.  EXAM: CT HEAD WITHOUT CONTRAST  TECHNIQUE: Contiguous axial images were obtained from the base of the skull through the vertex without intravenous contrast.  COMPARISON:  MRI brain 09/06/2014.  CT head 09/05/2014.  FINDINGS: Diffuse cerebral atrophy. Low-attenuation changes in the deep white matter consistent with small vessel ischemia. Old lacune in the left basal ganglia. No mass effect or midline shift. No abnormal extra-axial fluid collections. Gray-white matter junctions are distinct. Basal cisterns are not effaced. No evidence of acute  intracranial hemorrhage. No depressed skull fractures. Visualized paranasal sinuses and mastoid air cells are not opacified. Vascular calcifications.  IMPRESSION: No acute intracranial abnormalities. Chronic atrophy and small vessel ischemic changes. Old lacunar infarct in the left basal ganglia. No change since prior study.   Electronically Signed   By: Burman Nieves M.D.   On: 01/18/2015 04:49   Mr Shirlee Latch ZO Contrast  01/18/2015   ADDENDUM REPORT: 01/18/2015 07:56  ADDENDUM: Study discussed by telephone with Dr. Thana Farr on 01/18/2015 at 0744 hours.   Electronically Signed   By: Odessa Fleming M.D.   On: 01/18/2015 07:56   01/18/2015   CLINICAL DATA:  81 year old female status post recent lumbar surgery with acute left side weakness and facial droop. Initial encounter.  EXAM: MRI HEAD WITHOUT CONTRAST  MRA HEAD WITHOUT CONTRAST  TECHNIQUE: Multiplanar, multiecho pulse sequences of the brain and surrounding structures were obtained without intravenous contrast. Angiographic images of the head were obtained using MRA technique without contrast.  COMPARISON:  Head CT without contrast 0418 hours today. Brain MRI and intracranial MRA 09/06/2014.  FINDINGS: MRI HEAD FINDINGS  Confluent 2.5 cm area of restricted diffusion in the posterior right ACA territory, with patchy involvement of the medial motor strip. Patchy right cingulate gyrus involvement. Subtle associated T2 and FLAIR hyperintensity. No definite associated hemorrhage. No mass effect.  Superimposed punctate restricted diffusion along the medial left thalamus (series 7, image 14). No other left hemisphere restricted diffusion identified. No posterior fossa restricted diffusion identified.  Major intracranial vascular flow voids are stable at the skullbase. Loss of the distal right ACA flow void with associated increased FLAIR signal, see MRA findings below.  Scattered round areas of T2* susceptibility appears related to the subarachnoid space, and  corresponds to several small foci of very low density (negative 200-300 Hounsfield units) on the head CT ear earlier today.  Stable gray and white matter signal elsewhere. No ventriculomegaly. No midline shift. Basilar cisterns are patent. No extra-axial collection. No intracranial mass lesion. Pituitary and cervicomedullary junction appear stable. Normal bone marrow signal.  Internal auditory structures, paranasal sinuses, mastoids, orbits, and scalp soft tissues are stable.  MRA HEAD FINDINGS  Stable antegrade flow in the distal vertebral arteries. No distal vertebral or basilar artery stenosis. PICA origins remain patent. Stable SCA and PCA origins. Stable bilateral PCA branches, with mild to moderate irregularity and stenosis of the right P2 segment but preserved distal flow. Posterior communicating arteries are diminutive or absent.  Stable antegrade flow in both ICA siphons. Carotid termini remain patent. MCA and ACA origins are stable and within normal limits.  Increased irregularity of the bilateral MCA M1 segments and right ACA A1 segment, might in part be related to mild motion artifact today. Evidence of moderate to severe stenosis now in the left M1 segment. However, both MCA bifurcations remain patent. No  M2 branch occlusion is identified, with bilateral M2 branch irregularity re- identified.  The left ACA appears stable. The right ACA A1 segment is mildly irregular, an the right A2 segment is occluded in the pericallosal artery region. See series 4, images 118-123).  IMPRESSION: 1. Right ACA distal A2 segment occlusion with acute posterior right ACA infarct, involving some of the medial motor strip. Mild cytotoxic edema without hemorrhage or mass effect. 2. Increased left worse than right MCA M1 segment irregularity may reflect new thromboembolic disease. No major MCA branch occlusion. 3. Punctate acute infarct in the medial left thalamus with no mass effect or hemorrhage. The posterior circulation  appears stable since March. 4. Scattered small areas of susceptibility appear related to small very low density foci in the subarachnoid space on this CT earlier today. Favor trace pneumocephalus, presumably arising from the recent lumbar surgery.  Electronically Signed: By: Odessa Fleming M.D. On: 01/18/2015 07:39   Mr Brain Wo Contrast  01/18/2015   ADDENDUM REPORT: 01/18/2015 07:56  ADDENDUM: Study discussed by telephone with Dr. Thana Farr on 01/18/2015 at 0744 hours.   Electronically Signed   By: Odessa Fleming M.D.   On: 01/18/2015 07:56   01/18/2015   CLINICAL DATA:  81 year old female status post recent lumbar surgery with acute left side weakness and facial droop. Initial encounter.  EXAM: MRI HEAD WITHOUT CONTRAST  MRA HEAD WITHOUT CONTRAST  TECHNIQUE: Multiplanar, multiecho pulse sequences of the brain and surrounding structures were obtained without intravenous contrast. Angiographic images of the head were obtained using MRA technique without contrast.  COMPARISON:  Head CT without contrast 0418 hours today. Brain MRI and intracranial MRA 09/06/2014.  FINDINGS: MRI HEAD FINDINGS  Confluent 2.5 cm area of restricted diffusion in the posterior right ACA territory, with patchy involvement of the medial motor strip. Patchy right cingulate gyrus involvement. Subtle associated T2 and FLAIR hyperintensity. No definite associated hemorrhage. No mass effect.  Superimposed punctate restricted diffusion along the medial left thalamus (series 7, image 14). No other left hemisphere restricted diffusion identified. No posterior fossa restricted diffusion identified.  Major intracranial vascular flow voids are stable at the skullbase. Loss of the distal right ACA flow void with associated increased FLAIR signal, see MRA findings below.  Scattered round areas of T2* susceptibility appears related to the subarachnoid space, and corresponds to several small foci of very low density (negative 200-300 Hounsfield units) on the  head CT ear earlier today.  Stable gray and white matter signal elsewhere. No ventriculomegaly. No midline shift. Basilar cisterns are patent. No extra-axial collection. No intracranial mass lesion. Pituitary and cervicomedullary junction appear stable. Normal bone marrow signal.  Internal auditory structures, paranasal sinuses, mastoids, orbits, and scalp soft tissues are stable.  MRA HEAD FINDINGS  Stable antegrade flow in the distal vertebral arteries. No distal vertebral or basilar artery stenosis. PICA origins remain patent. Stable SCA and PCA origins. Stable bilateral PCA branches, with mild to moderate irregularity and stenosis of the right P2 segment but preserved distal flow. Posterior communicating arteries are diminutive or absent.  Stable antegrade flow in both ICA siphons. Carotid termini remain patent. MCA and ACA origins are stable and within normal limits.  Increased irregularity of the bilateral MCA M1 segments and right ACA A1 segment, might in part be related to mild motion artifact today. Evidence of moderate to severe stenosis now in the left M1 segment. However, both MCA bifurcations remain patent. No M2 branch occlusion is identified, with bilateral M2 branch irregularity  re- identified.  The left ACA appears stable. The right ACA A1 segment is mildly irregular, an the right A2 segment is occluded in the pericallosal artery region. See series 4, images 118-123).  IMPRESSION: 1. Right ACA distal A2 segment occlusion with acute posterior right ACA infarct, involving some of the medial motor strip. Mild cytotoxic edema without hemorrhage or mass effect. 2. Increased left worse than right MCA M1 segment irregularity may reflect new thromboembolic disease. No major MCA branch occlusion. 3. Punctate acute infarct in the medial left thalamus with no mass effect or hemorrhage. The posterior circulation appears stable since March. 4. Scattered small areas of susceptibility appear related to small very  low density foci in the subarachnoid space on this CT earlier today. Favor trace pneumocephalus, presumably arising from the recent lumbar surgery.  Electronically Signed: By: Odessa Fleming M.D. On: 01/18/2015 07:39    Scheduled Meds: .  stroke: mapping our early stages of recovery book   Does not apply Once  . clopidogrel  75 mg Oral Daily  . dorzolamide-timolol  1 drop Both Eyes BID  . latanoprost  1 drop Both Eyes QHS  . levothyroxine  88 mcg Oral QAC breakfast  . metoprolol tartrate  25 mg Oral BID  . pantoprazole  40 mg Oral BID  . pravastatin  40 mg Oral q1800   Continuous Infusions:   Oma Marzan L  [facility] [email or web address], password [facility] 01/19/2015, 1:48 PM  LOS: 1 day

## 2015-01-19 NOTE — Evaluation (Signed)
Occupational Therapy Evaluation Patient Details Name: Angela Keith MRN: [id number] DOB: 1929/08/31 Today's Date: 01/19/2015    History of Present Illness 81 y.o. female with a past medical history significant for HTN, hyperlipidemia, presents with transient episodes of left sided weakness, left face droop. MRI revealed multiple acute infarcts. Pt underwent uncomplicated lumbar decompressive laminectomy with Dr. Jordan Likes on 7/12.    Clinical Impression   Pt willing to participate in therapy, but limited by pain from recent back surgery on 7/12. Pt HOH and reads lips; wearing hearing aide on R side only. Pt sat EOB for 10 minutes with VC's and assistance for balance. Pt has L UE and LE deficits that impact her ability to complete ADLs. Pt would benefit from CIR to increase safety and independence with ADLs.     Follow Up Recommendations  CIR;Supervision/Assistance - 24 hour    Equipment Recommendations  Other (comment) (TBD next venue)    Recommendations for Other Services       Precautions / Restrictions Precautions Precautions: Back Precaution Comments: unable to recall precuations,  Required Braces or Orthoses:  (Has brace at home for comfort ) Restrictions Weight Bearing Restrictions: No      Mobility Bed Mobility Overal bed mobility: Needs Assistance Bed Mobility: Rolling;Sidelying to Sit;Sit to Sidelying Rolling: Mod assist;+2 for physical assistance Sidelying to sit: Max assist     Sit to sidelying: Max assist;+2 for physical assistance General bed mobility comments: Patient able to initiate rolling with Right side but requires assist for positioning and rolling to side lying, maximial assist to power to upright (some pushing with RUE noted) and assist for trunk and LEs to return to sidelying and reposition  Transfers                 General transfer comment: unable to perform    Balance Overall balance assessment: Needs assistance Sitting-balance support:  Single extremity supported;Feet supported Sitting balance-Leahy Scale: Poor Sitting balance - Comments: required moderate to maximial assist to sustain balance Postural control: Posterior lean                                  ADL Overall ADL's : Needs assistance/impaired Eating/Feeding: NPO   Grooming: Moderate assistance;Sitting (supported sitting)   Upper Body Bathing: Moderate assistance;Sitting (supported sitting)   Lower Body Bathing: Maximal assistance;Sit to/from stand;Adhering to back precautions   Upper Body Dressing : Moderate assistance;Sitting   Lower Body Dressing: Maximal assistance;Sit to/from stand;Adhering to back precautions   Toilet Transfer: Maximal assistance;+2 for physical assistance   Toileting- Clothing Manipulation and Hygiene: Sit to/from stand;Moderate assistance         General ADL Comments: Pt limited by back pain     Vision Vision Assessment?: Yes Eye Alignment: Within Functional Limits Alignment/Gaze Preference: Within Defined Limits Tracking/Visual Pursuits: Requires cues, head turns, or add eye shifts to track;Decreased smoothness of horizontal tracking;Decreased smoothness of vertical tracking Visual Fields: Impaired-to be further tested in functional context Additional Comments: Pt has decreased peripheral vision; does not identify objects until they are in central visual field   Perception     Praxis      Pertinent Vitals/Pain Pain Assessment: Faces Faces Pain Scale: Hurts even more Pain Location: Low back regionl Pain Descriptors / Indicators: Aching;Constant;Operative site guarding;Grimacing Pain Intervention(s): Limited activity within patient's tolerance;Monitored during session;Repositioned     Hand Dominance Right   Extremity/Trunk Assessment Upper Extremity Assessment Upper Extremity  Assessment: [name] deficits/detail [name] Deficits / Details: 3-/5 MMT shoulder flexion; decreased grip strenght, pt able to  oppose fingers [name] Coordination: decreased fine motor;decreased gross motor   Lower Extremity Assessment Lower Extremity Assessment: Generalized weakness;LLE deficits/detail LLE Deficits / Details: flacid LLE no active motion noted LLE Sensation: decreased light touch;decreased proprioception LLE Coordination: decreased fine motor;decreased gross motor   Cervical / Trunk Assessment Cervical / Trunk Assessment: Kyphotic   Communication Communication Communication: HOH   Cognition Arousal/Alertness: Awake/alert Behavior During Therapy: Flat affect Overall Cognitive Status: Difficult to assess Area of Impairment: Attention;Problem solving   Current Attention Level: Focused Memory: Decreased recall of precautions;Decreased short-term memory       Problem Solving: Slow processing;Decreased initiation;Difficulty sequencing;Requires verbal cues;Requires tactile cues General Comments: Patient extremely hard of hearing and easily distracted throughout session   General Comments       Exercises       Shoulder Instructions      Home Living Family/patient expects to be discharged to:: Private residence Living Arrangements: Alone   Type of Home: House Home Access: Level entry     Home Layout: Able to live on main level with bedroom/bathroom     Bathroom Shower/Tub: Tub/shower unit;Curtain   Bathroom Toilet: Handicapped height     Home Equipment: Environmental consultantWalker - 2 wheels;Wheelchair - manual;Shower seat          Prior Functioning/Environment Level of Independence: Independent             OT Diagnosis: Generalized weakness;Disturbance of vision;Acute pain;Hemiplegia non-dominant side   OT Problem List: Decreased strength;Decreased range of motion;Decreased activity tolerance;Impaired balance (sitting and/or standing);Impaired vision/perception;Decreased safety awareness;Decreased knowledge of use of DME or AE;Impaired tone;Impaired UE functional use;Pain   OT  Treatment/Interventions: Self-care/ADL training;Therapeutic exercise;DME and/or AE instruction;Therapeutic activities;Visual/perceptual remediation/compensation;Patient/family education;Balance training    OT Goals(Current goals can be found in the care plan section) Acute Rehab OT Goals Patient Stated Goal: to get better OT Goal Formulation: With patient Time For Goal Achievement: 02/02/15 Potential to Achieve Goals: Good ADL Goals Pt Will Perform Grooming: with min assist;sitting Pt Will Perform Upper Body Dressing: with min assist;sitting Pt Will Transfer to Toilet: with mod assist;stand pivot transfer;bedside commode Pt Will Perform Toileting - Clothing Manipulation and hygiene: sitting/lateral leans;with mod assist  OT Frequency: Min 3X/week   Barriers to D/C:            Co-evaluation              End of Session Nurse Communication: Mobility status;Precautions;Other (comment) (Pt left in sidelying)  Activity Tolerance: Patient limited by pain Patient left: in bed;with call bell/phone within reach;with bed alarm set;with family/visitor present   Time: [id number] OT Time Calculation (min): 20 min Charges:  OT General Charges $OT Visit: 1 Procedure OT Evaluation $Initial OT Evaluation Tier I: 1 Procedure G-Codes:    Marden NobleMary Rebecca Jakori Keith 01/19/2015, 1:50 PM

## 2015-01-19 NOTE — CV Procedure (Signed)
    TRANSESOPHAGEAL ECHOCARDIOGRAM (TEE) NOTE  INDICATIONS: stroke  PROCEDURE:   Informed consent was obtained prior to the procedure. The risks, benefits and alternatives for the procedure were discussed and the patient comprehended these risks.  Risks include, but are not limited to, cough, sore throat, vomiting, nausea, somnolence, esophageal and stomach trauma or perforation, bleeding, low blood pressure, aspiration, pneumonia, infection, trauma to the teeth and death.    After a procedural time-out, the patient was given 4 mg versed and 25 mcg fentanyl for moderate sedation.  The oropharynx was anesthetized with 2 cetacaine sprays.  The transesophageal probe was unable to be inserted, despite multiple attempts and patient repositioning. She apparently was noted after the procedure to have a history of esophageal dilitation in the past. There is a recessed lower jaw which contributed to a small posterior oropharynx. Based on the difficulty in inserting the probe, I elected to abort the procedure due to the concern for the possibility of esophageal perforation.  She was returned to post-procedure recovery in stable condition.   IMPRESSION:   1. Unsuccessful esophageal intubation - due to unsuitable anatomy for TEE, prior esophageal stricture and recent stroke. 2. Would proceed with ILR to look for occult atrial fibrillation and resume plavix, if not already started.  Time Spent Directly with the Patient:  30 minutes   Chrystie NoseKenneth C. Herlinda Heady, MD, [facility] Attending Cardiologist [facility]  01/19/2015, 2:59 PM

## 2015-01-19 NOTE — Progress Notes (Signed)
SLP Cancellation Note  Patient Details Name: Norva PavlovRachel E Friedlander MRN: [id number] DOB: 29-Jun-1930   Cancelled treatment:       Reason Eval/Treat Not Completed: Patient at procedure or test/unavailable (In endo. Will f/u 7/15. )  Ferdinand LangoLeah Pier Bosher MA, CCC-SLP [phone]  Toryn Dewalt Meryl 01/19/2015, 2:44 PM

## 2015-01-19 NOTE — Progress Notes (Signed)
Physical medicine rehabilitation consult requested chart reviewed. Patient currently in TEE and possible need for loop recorder. Discussed with family they prefer to hold rehabilitation consult until tomorrow. Will follow up in a.m. with appropriate recommendations

## 2015-01-19 NOTE — Progress Notes (Signed)
STROKE TEAM PROGRESS NOTE   HISTORY Angela Keith is an 81 y.o. female with a past medical history significant for HTN, hyperlipidemia, presumed CVA (cerebral infarction) s/p IV tPA, MRI negative, GERD, who underwent uncomplicated lumbar decompressive laminectomy and foraminotomies on 7/12, brought in for further evaluation of intermittent left sided weakness, left face droop. Patient was doing well after discharge home, ambulating without assistance, but family indicated that yesterday around 4 pm (LKW 01/17/2015 at 1600) patient was noted to be intermittently dragging the left leg and complaining that the left leg was numb and weak. In addition, family also noted episodes in which her left arm was weak. Further, noted to have left face droopiness. As per patient and family, such episodes of left LE weakness can lasted for approximately one hour. She said that the left leg was weaker than the right leg before surgery. Complains of a mild frontal HA but denies vertigo, double vision, difficulty swallowing, unsteadiness, slurred speech, confusion, fatigue, language or vision impairment. Having intermittent cramps right LE. CT brain was reviewed and showed no acute abnormality. It is worth noting that she has been off plavix for 4 days due to recent surgery. Patient was not administered TPA secondary to surgery 7/12 and mild intermittent deficits. She was admitted for further evaluation and treatment.   SUBJECTIVE (INTERVAL HISTORY) Her husband is at the bedside.  She is anxious about TEE  OBJECTIVE Temp:  [97.5 F (36.4 C)-99 F (37.2 C)] 98.7 F (37.1 C) (07/14 1059) Pulse Rate:  [68-84] 68 (07/14 1450) Cardiac Rhythm:  [-]  Resp:  [8-24] 8 (07/14 1450) BP: (118-179)/(51-92) 154/77 mmHg (07/14 1450) SpO2:  [93 %-100 %] 99 % (07/14 1450)  No results for input(s): GLUCAP in the last 168 hours.  Recent Labs Lab 01/18/15 0341 01/18/15 0349 01/19/15 0452  NA 134* 136 133*  K 3.9 3.8 3.6  CL  99* 98* 98*  CO2 27  --  25  GLUCOSE 118* 115* 121*  BUN CREATININE 1.06* 1.00 0.82  CALCIUM 9.6  --  9.0    Recent Labs Lab 01/18/15 0341  AST 27  ALT 17  ALKPHOS 47  BILITOT 0.7  PROT 6.7  ALBUMIN 3.8    Recent Labs Lab 01/18/15 0341 01/18/15 0349  WBC 10.9*  --   NEUTROABS 7.9*  --   HGB 11.5* 13.3  HCT 34.8* 39.0  MCV 85.3  --   PLT 224  --    No results for input(s): CKTOTAL, CKMB, CKMBINDEX, TROPONINI in the last 168 hours.  Recent Labs  01/18/15 0341 01/19/15 0452  LABPROT 14.0 14.5  INR 1.06 1.11    Recent Labs  01/18/15 0513  COLORURINE YELLOW  LABSPEC 1.009  PHURINE 7.0  GLUCOSEU NEGATIVE  HGBUR NEGATIVE  BILIRUBINUR NEGATIVE  KETONESUR NEGATIVE  PROTEINUR NEGATIVE  UROBILINOGEN 0.2  NITRITE NEGATIVE  LEUKOCYTESUR NEGATIVE       Component Value Date/Time   CHOL 122 01/19/2015 0452   TRIG 20 01/19/2015 0452   HDL 52 01/19/2015 0452   CHOLHDL 2.3 01/19/2015 0452   VLDL 4 01/19/2015 0452   LDLCALC 66 01/19/2015 0452   Lab Results  Component Value Date   HGBA1C 6.0* 09/06/2014      Component Value Date/Time   LABOPIA NONE DETECTED 09/05/2014 1149   COCAINSCRNUR NONE DETECTED 09/05/2014 1149   LABBENZ NONE DETECTED 09/05/2014 1149   AMPHETMU NONE DETECTED 09/05/2014 1149   THCU NONE DETECTED 09/05/2014 1149  LABBARB NONE DETECTED 09/05/2014 1149    No results for input(s): ETH in the last 168 hours.  Dg Chest 2 View 01/18/2015    Shallow inspiration.  No evidence of active pulmonary disease.     Ct Head Wo Contrast 01/18/2015   No acute intracranial abnormalities. Chronic atrophy and small vessel ischemic changes. Old lacunar infarct in the left basal ganglia. No change since prior study.     MRI & MRA Brain Wo Contrast 01/18/2015    1. Right ACA distal A2 segment occlusion with acute posterior right ACA infarct, involving some of the medial motor strip. Mild cytotoxic edema without hemorrhage or mass effect. 2.  Increased left worse than right MCA M1 segment irregularity may reflect new thromboembolic disease. No major MCA branch occlusion. 3. Punctate acute infarct in the medial left thalamus with no mass effect or hemorrhage. The posterior circulation appears stable since March. 4. Scattered small areas of susceptibility appear related to small very low density foci in the subarachnoid space on this CT earlier today. Favor trace pneumocephalus, presumably arising from the recent lumbar surgery.    Dg Lumbar Spine 1 View 01/17/2015    Metallic marker noted along the posterior aspect of L4.       PHYSICAL EXAM Pleasant elderly caucasian lady not in distress. . Afebrile. Head is nontraumatic. Neck is supple without bruit.    Cardiac exam no murmur or gallop. Lungs are clear to auscultation. Distal pulses are well felt. Neurological Exam :  Awake alert oriented x 3 normal speech and language. Mild left lower face asymmetry. Tongue midline. No drift. Mild diminished fine finger movements on left. Orbits right over left upper extremity. Mild left grip weak..LLe 3/5 strength Diminished left hemibody sensation . Normal coordination. ASSESSMENT/PLAN [name] is a 81 y.o. female with history of HTN, hyperlipidemia, presumed CVA (cerebral infarction) s/p IV tPA, MRI negative, GERD, who underwent uncomplicated lumbar decompressive laminectomy and foraminotomies on 7/12, brought in for further evaluation of intermittent left sided weakness, left face droop. She did not receive IV t-PA due to recent surgery and mild intermittent deficits.   Stroke:  Non-dominant right ACA and punctate L thalamic infarcts,  embolic secondary to unknown source  Resultant  Left sided hemiparesis, L facial weakness  MRI  R ACA infarct, punctate L thalamic infarct  MRA  L > R MCA M1 disease, R A2 occlusion (new since March), posterior circulation okay TEE to look for embolic source. Arranged with [facility] for tomorrow.  If positive for PFO (patent foramen ovale), check bilateral lower extremity venous dopplers to rule out DVT as possible source of stroke. (I have made patient NPO after midnight tonight).  If TEE negative, a [facility] electrophys[facility] will consult and consider placement of an implantable loop recorder to evaluate for atrial fibrillation as etiology of stroke. This has been explained to patient/family by Dr. Pearlean BrownieSethi and they are agreeable.   LDL 137 in March  HgbA1c 6.0 in March  No VTE prophylaxis noted Diet NPO time specified  clopidogrel 75 mg orally every day prior to admission (had been on dual antiplatelets x 3 mos following stroke in March), now on no antithrombotics ok to resume Plavix from surgeon standpoint. Have reordered  Ongoing aggressive stroke risk factor management  Therapy recommendations: Rehab  Disposition:  pending   Hypertension  Stable  Hyperlipidemia  Home meds:  pravachol 40, resumed in hospital  LDL 137 in March,  goal < 70  As already on a statin, will not recheck LDL as plan to continue statin at discharge  Other Stroke Risk Factors  Advanced age  Hx stroke/TIA - presumed CVA (cerebral infarction) s/p IV tPA, MRI negative,  Family hx stroke (father)  Other Active Problems  Severe spinal stenosis at L2-3 and L3-4 with neurogenic claudication s/p L2-3 L3-4 decompressive laminectomy with foraminotomies  Hospital day # 1  SETHI,PRAMOD  [facility] See Amion for Pager information 01/19/2015 3:03 PM  I have personally examined this patient, reviewed notes, independently viewed imaging studies, participated in medical decision making and plan of care. I have made any additions or clarifications directly to the above note. Agree with note above.  She has h/o prior stroke and new difficulty walking from new right ACA and left medial thalamicinfarcts likely from unknown embolic source and  is at risk for neurological worsening, recurrent stroke and TIA and needs ongoing stroke evaluation and aggressive risk factor control. Check TEE and loop recorder implant. D/w patient, husband and answered questions.   Delia Heady, MD Medical Director [facility] Pager: [phone] 01/19/2015 3:03 PM    To contact Stroke Continuity provider, please refer to [email or web address]. After hours, contact General Neurology

## 2015-01-19 NOTE — Evaluation (Signed)
Physical Therapy Evaluation Patient Details Name: Angela Keith MRN: [id number] DOB: Jan 13, 1930 Today's Date: 01/19/2015   History of Present Illness  81 y.o. female with a past medical history significant for HTN, hyperlipidemia, presents with transient episodes of left sided weakness, left face droop. MRI revealed multiple acute infarcts. Pt underwent uncomplicated lumbar decompressive laminectomy with Dr. Jordan Likes on 7/12.   Clinical Impression  Patient demonstrates deficits in functional mobility as indicated below. Will benefit from continued skilled PT to address deficits and maximize function. Will see as indicated and progress as tolerated. Patient may benefit from CIR upon acute discharge to maximize functional recovery and decreased burden of care.     Follow Up Recommendations CIR;Supervision/Assistance - 24 hour    Equipment Recommendations   (TBD)    Recommendations for Other Services Rehab consult     Precautions / Restrictions Precautions Precautions: Back Precaution Comments: unable to recall precuations,  Required Braces or Orthoses:  (Has brace at home for comfort ) Restrictions Weight Bearing Restrictions: No      Mobility  Bed Mobility Overal bed mobility: Needs Assistance Bed Mobility: Rolling;Sidelying to Sit;Sit to Sidelying Rolling: Mod assist;+2 for physical assistance Sidelying to sit: Max assist     Sit to sidelying: Max assist;+2 for physical assistance General bed mobility comments: Patient able to initiate rolling with Right side but requires assist for positioning and rolling to side lying, maximial assist to power to upright (some pushing with RUE noted) and assist for trunk and LEs to return to sidelying and reposition  Transfers                 General transfer comment: unable to perform  Ambulation/Gait             General Gait Details: unable to perform  Stairs            Wheelchair Mobility    Modified Rankin  (Stroke Patients Only) Modified Rankin (Stroke Patients Only) Pre-Morbid Rankin Score: No significant disability Modified Rankin: Severe disability     Balance Overall balance assessment: Needs assistance Sitting-balance support: Single extremity supported;Feet supported Sitting balance-Leahy Scale: Poor Sitting balance - Comments: required moderate to maximial assist to sustain balance Postural control: Posterior lean                                   Pertinent Vitals/Pain Pain Assessment: Faces Faces Pain Scale: Hurts even more Pain Location: Low back regionl Pain Descriptors / Indicators: Aching;Constant;Operative site guarding;Grimacing Pain Intervention(s): Limited activity within patient's tolerance;Monitored during session;Repositioned    Home Living Family/patient expects to be discharged to:: Private residence Living Arrangements: Alone   Type of Home: House Home Access: Level entry     Home Layout: Able to live on main level with bedroom/bathroom Home Equipment: Walker - 2 wheels;Wheelchair - manual;Shower seat      Prior Function Level of Independence: Independent               Hand Dominance   Dominant Hand: Right    Extremity/Trunk Assessment               Lower Extremity Assessment: Generalized weakness;LLE deficits/detail   LLE Deficits / Details: flacid LLE no active motion noted  Cervical / Trunk Assessment: Kyphotic  Communication   Communication: HOH  Cognition Arousal/Alertness: Awake/alert Behavior During Therapy: Flat affect Overall Cognitive Status: Difficult to assess Area of Impairment: Attention;Problem solving  Current Attention Level: Focused Memory: Decreased recall of precautions;Decreased short-term memory       Problem Solving: Slow processing;Decreased initiation;Difficulty sequencing;Requires verbal cues;Requires tactile cues General Comments: Patient extremely hard of hearing and easily  distracted throughout session    General Comments      Exercises        Assessment/Plan    PT Assessment Patient needs continued PT services  PT Diagnosis Difficulty walking;Abnormality of gait;Generalized weakness;Acute pain;Hemiplegia non-dominant side   PT Problem List Decreased strength;Decreased range of motion;Decreased activity tolerance;Decreased balance;Decreased mobility;Decreased coordination;Decreased cognition;Decreased safety awareness;Decreased knowledge of precautions;Pain  PT Treatment Interventions DME instruction;Gait training;Functional mobility training;Therapeutic activities;Therapeutic exercise;Balance training;Neuromuscular re-education;Cognitive remediation;Patient/family education;Wheelchair mobility training   PT Goals (Current goals can be found in the Care Plan section) Acute Rehab PT Goals Patient Stated Goal: to get better PT Goal Formulation: With patient/family Time For Goal Achievement: 02/02/15 Potential to Achieve Goals: Good    Frequency Min 3X/week   Barriers to discharge        Co-evaluation               End of Session Equipment Utilized During Treatment: Oxygen Activity Tolerance: Patient tolerated treatment well;Patient limited by fatigue;Patient limited by pain Patient left: in bed;with call bell/phone within reach;with bed alarm set;with family/visitor present Nurse Communication: Mobility status;Need for lift equipment;Precautions         Time: [id number] PT Time Calculation (min) (ACUTE ONLY): 22 min   Charges:    1 PT evaluation     PT G CodesFabio Asa:        Subrina Vecchiarelli J 01/19/2015, 11:51 AM Charlotte Crumbevon Reign Bartnick, PT DPT  [phone]

## 2015-01-19 NOTE — H&P (Signed)
     INTERVAL PROCEDURE H&P  History and Physical Interval Note:  01/19/2015 2:06 PM  Angela Keith has presented today for their planned procedure. The various methods of treatment have been discussed with the patient and family. After consideration of risks, benefits and other options for treatment, the patient has consented to the procedure.  The patients' outpatient history has been reviewed, patient examined, and no change in status from most recent office note within the past 30 days. I have reviewed the patients' chart and labs and will proceed as planned. Questions were answered to the patient's satisfaction.   Angela NoseKenneth C. Kylin Dubs, MD, [facility] Attending Cardiologist [facility]  Angela NoseKenneth C [place] Keith 01/19/2015, 2:06 PM

## 2015-01-19 NOTE — OR Nursing (Signed)
Unable to pass the ultrasound probe after several attempts.  TEE aborted

## 2015-01-19 NOTE — Progress Notes (Signed)
Rehab Admissions Coordinator Note:  Patient was screened by Clois DupesBoyette, Tila Millirons Godwin for appropriateness for an Inpatient Acute Rehab Consult. Per PT recommendation. Noted new CVA. At this time, we are recommending Inpatient Rehab consult. I will contaqt Attending MD.   Clois DupesBoyette, Brentton Wardlow Godwin 01/19/2015, 12:26 PM  I can be reached at [phone].

## 2015-01-20 ENCOUNTER — Inpatient Hospital Stay (HOSPITAL_COMMUNITY)
Admission: RE | Admit: 2015-01-20 | Discharge: 2015-02-10 | DRG: 057 | Disposition: A | Discharge status: Skilled Nursing Facility | Payer: Medicare Other | Source: Intra-hospital | Attending: Physical Medicine & Rehabilitation | Admitting: Physical Medicine & Rehabilitation

## 2015-01-20 ENCOUNTER — Encounter (HOSPITAL_COMMUNITY): Payer: Self-pay | Admitting: Internal Medicine

## 2015-01-20 DIAGNOSIS — G2581 Restless legs syndrome: Secondary | ICD-10-CM

## 2015-01-20 DIAGNOSIS — Z7902 Long term (current) use of antithrombotics/antiplatelets: Secondary | ICD-10-CM | POA: Diagnosis not present

## 2015-01-20 DIAGNOSIS — E871 Hypo-osmolality and hyponatremia: Secondary | ICD-10-CM | POA: Diagnosis not present

## 2015-01-20 DIAGNOSIS — I69354 Hemiplegia and hemiparesis following cerebral infarction affecting left non-dominant side: Principal | ICD-10-CM

## 2015-01-20 DIAGNOSIS — M6281 Muscle weakness (generalized): Secondary | ICD-10-CM | POA: Diagnosis not present

## 2015-01-20 DIAGNOSIS — I639 Cerebral infarction, unspecified: Secondary | ICD-10-CM | POA: Diagnosis not present

## 2015-01-20 DIAGNOSIS — R110 Nausea: Secondary | ICD-10-CM | POA: Diagnosis not present

## 2015-01-20 DIAGNOSIS — E039 Hypothyroidism, unspecified: Secondary | ICD-10-CM | POA: Diagnosis not present

## 2015-01-20 DIAGNOSIS — G629 Polyneuropathy, unspecified: Secondary | ICD-10-CM | POA: Diagnosis not present

## 2015-01-20 DIAGNOSIS — N390 Urinary tract infection, site not specified: Secondary | ICD-10-CM | POA: Diagnosis not present

## 2015-01-20 DIAGNOSIS — B9620 Unspecified Escherichia coli [E. coli] as the cause of diseases classified elsewhere: Secondary | ICD-10-CM

## 2015-01-20 DIAGNOSIS — H409 Unspecified glaucoma: Secondary | ICD-10-CM

## 2015-01-20 DIAGNOSIS — R259 Unspecified abnormal involuntary movements: Secondary | ICD-10-CM

## 2015-01-20 DIAGNOSIS — M1990 Unspecified osteoarthritis, unspecified site: Secondary | ICD-10-CM | POA: Diagnosis not present

## 2015-01-20 DIAGNOSIS — G8190 Hemiplegia, unspecified affecting unspecified side: Secondary | ICD-10-CM

## 2015-01-20 DIAGNOSIS — R4701 Aphasia: Secondary | ICD-10-CM

## 2015-01-20 DIAGNOSIS — M452 Ankylosing spondylitis of cervical region: Secondary | ICD-10-CM | POA: Diagnosis not present

## 2015-01-20 DIAGNOSIS — I10 Essential (primary) hypertension: Secondary | ICD-10-CM

## 2015-01-20 DIAGNOSIS — M48061 Spinal stenosis, lumbar region without neurogenic claudication: Secondary | ICD-10-CM | POA: Insufficient documentation

## 2015-01-20 DIAGNOSIS — G253 Myoclonus: Secondary | ICD-10-CM

## 2015-01-20 DIAGNOSIS — I69391 Dysphagia following cerebral infarction: Secondary | ICD-10-CM | POA: Diagnosis not present

## 2015-01-20 DIAGNOSIS — R05 Cough: Secondary | ICD-10-CM

## 2015-01-20 DIAGNOSIS — H9190 Unspecified hearing loss, unspecified ear: Secondary | ICD-10-CM

## 2015-01-20 DIAGNOSIS — M4806 Spinal stenosis, lumbar region: Secondary | ICD-10-CM | POA: Diagnosis not present

## 2015-01-20 DIAGNOSIS — R41841 Cognitive communication deficit: Secondary | ICD-10-CM | POA: Diagnosis not present

## 2015-01-20 DIAGNOSIS — Z79899 Other long term (current) drug therapy: Secondary | ICD-10-CM | POA: Diagnosis not present

## 2015-01-20 DIAGNOSIS — N319 Neuromuscular dysfunction of bladder, unspecified: Secondary | ICD-10-CM | POA: Diagnosis not present

## 2015-01-20 DIAGNOSIS — I6330 Cerebral infarction due to thrombosis of unspecified cerebral artery: Secondary | ICD-10-CM | POA: Diagnosis not present

## 2015-01-20 DIAGNOSIS — E785 Hyperlipidemia, unspecified: Secondary | ICD-10-CM

## 2015-01-20 DIAGNOSIS — R278 Other lack of coordination: Secondary | ICD-10-CM | POA: Diagnosis not present

## 2015-01-20 DIAGNOSIS — R609 Edema, unspecified: Secondary | ICD-10-CM

## 2015-01-20 DIAGNOSIS — Z8673 Personal history of transient ischemic attack (TIA), and cerebral infarction without residual deficits: Secondary | ICD-10-CM | POA: Diagnosis not present

## 2015-01-20 DIAGNOSIS — G8194 Hemiplegia, unspecified affecting left nondominant side: Secondary | ICD-10-CM | POA: Insufficient documentation

## 2015-01-20 DIAGNOSIS — K219 Gastro-esophageal reflux disease without esophagitis: Secondary | ICD-10-CM | POA: Diagnosis not present

## 2015-01-20 DIAGNOSIS — G4700 Insomnia, unspecified: Secondary | ICD-10-CM

## 2015-01-20 DIAGNOSIS — R339 Retention of urine, unspecified: Secondary | ICD-10-CM

## 2015-01-20 DIAGNOSIS — R262 Difficulty in walking, not elsewhere classified: Secondary | ICD-10-CM | POA: Diagnosis not present

## 2015-01-20 DIAGNOSIS — R053 Chronic cough: Secondary | ICD-10-CM

## 2015-01-20 DIAGNOSIS — M48062 Spinal stenosis, lumbar region with neurogenic claudication: Secondary | ICD-10-CM | POA: Diagnosis present

## 2015-01-20 LAB — CBC
HEMATOCRIT: 37.2 % (ref 36.0–46.0)
Hemoglobin: 12.1 g/dL (ref 12.0–15.0)
MCH: 28.1 pg (ref 26.0–34.0)
MCHC: 32.5 g/dL (ref 30.0–36.0)
MCV: 86.5 fL (ref 78.0–100.0)
Platelets: 216 10*3/uL (ref 150–400)
RBC: 4.3 MIL/uL (ref 3.87–5.11)
RDW: 13.7 % (ref 11.5–15.5)
WBC: 10.2 10*3/uL (ref 4.0–10.5)

## 2015-01-20 LAB — CREATININE, SERUM
Creatinine, Ser: 1.04 mg/dL — ABNORMAL HIGH (ref 0.44–1.00)
GFR calc Af Amer: 56 mL/min — ABNORMAL LOW (ref >60)
GFR, EST NON AFRICAN AMERICAN: 49 mL/min — AB (ref >60)

## 2015-01-20 LAB — HEMOGLOBIN A1C
Hgb A1c MFr Bld: 6.1 % — ABNORMAL HIGH (ref 4.8–5.6)
Mean Plasma Glucose: 128 mg/dL

## 2015-01-20 LAB — MAGNESIUM: Magnesium: 2 mg/dL (ref 1.7–2.4)

## 2015-01-20 MED ORDER — CLOPIDOGREL BISULFATE 75 MG PO TABS
75.0000 mg | ORAL_TABLET | Freq: Every day | ORAL | Status: DC
  Administered 2015-01-21 – 2015-02-10 (×21): 75 mg via ORAL
  Filled 2015-01-20 (×27): qty 1

## 2015-01-20 MED ORDER — LEVOTHYROXINE SODIUM 88 MCG PO TABS
88.0000 ug | ORAL_TABLET | Freq: Every day | ORAL | Status: DC
  Administered 2015-01-21 – 2015-02-10 (×21): 88 ug via ORAL
  Filled 2015-01-20 (×22): qty 1

## 2015-01-20 MED ORDER — BACLOFEN 5 MG HALF TABLET
5.0000 mg | ORAL_TABLET | Freq: Two times a day (BID) | ORAL | Status: DC
  Administered 2015-01-20 – 2015-01-23 (×7): 5 mg via ORAL
  Filled 2015-01-20 (×10): qty 1

## 2015-01-20 MED ORDER — ONDANSETRON HCL 4 MG PO TABS
4.0000 mg | ORAL_TABLET | Freq: Four times a day (QID) | ORAL | Status: DC | PRN
  Administered 2015-01-22 – 2015-02-06 (×3): 4 mg via ORAL
  Filled 2015-01-20 (×3): qty 1

## 2015-01-20 MED ORDER — DORZOLAMIDE HCL-TIMOLOL MAL 2-0.5 % OP SOLN
1.0000 [drp] | Freq: Two times a day (BID) | OPHTHALMIC | Status: DC
  Administered 2015-01-20 – 2015-02-10 (×41): 1 [drp] via OPHTHALMIC
  Filled 2015-01-20 (×3): qty 10

## 2015-01-20 MED ORDER — PANTOPRAZOLE SODIUM 40 MG PO TBEC
40.0000 mg | DELAYED_RELEASE_TABLET | Freq: Two times a day (BID) | ORAL | Status: DC
  Administered 2015-01-20 – 2015-02-10 (×41): 40 mg via ORAL
  Filled 2015-01-20 (×43): qty 1

## 2015-01-20 MED ORDER — ACETAMINOPHEN 325 MG PO TABS
325.0000 mg | ORAL_TABLET | ORAL | Status: DC | PRN
  Administered 2015-01-21 – 2015-01-25 (×3): 650 mg via ORAL
  Filled 2015-01-20 (×3): qty 2

## 2015-01-20 MED ORDER — SORBITOL 70 % SOLN
30.0000 mL | Freq: Every day | Status: DC | PRN
  Administered 2015-01-21 – 2015-02-09 (×4): 30 mL via ORAL
  Filled 2015-01-20 (×4): qty 30

## 2015-01-20 MED ORDER — HYDROCODONE-ACETAMINOPHEN 5-325 MG PO TABS
0.5000 | ORAL_TABLET | ORAL | Status: DC | PRN
  Administered 2015-01-22: 1 via ORAL
  Administered 2015-01-22: 2 via ORAL
  Administered 2015-01-22 – 2015-01-23 (×5): 1 via ORAL
  Administered 2015-01-23: 0.5 via ORAL
  Filled 2015-01-20 (×2): qty 1
  Filled 2015-01-20: qty 2
  Filled 2015-01-20: qty 1
  Filled 2015-01-20: qty 2
  Filled 2015-01-20 (×3): qty 1

## 2015-01-20 MED ORDER — HEPARIN SODIUM (PORCINE) 5000 UNIT/ML IJ SOLN: 5000.0000 [IU] | Freq: Three times a day (TID) | INTRAMUSCULAR | Status: DC

## 2015-01-20 MED ORDER — BACLOFEN 10 MG PO TABS
5.0000 mg | ORAL_TABLET | Freq: Two times a day (BID) | ORAL | Status: DC
  Administered 2015-01-20: 5 mg via ORAL
  Filled 2015-01-20: qty 1

## 2015-01-20 MED ORDER — ONDANSETRON HCL 4 MG/2ML IJ SOLN: 4.0000 mg | Freq: Four times a day (QID) | INTRAMUSCULAR | Status: DC | PRN

## 2015-01-20 MED ORDER — HEPARIN SODIUM (PORCINE) 5000 UNIT/ML IJ SOLN
5000.0000 [IU] | Freq: Three times a day (TID) | INTRAMUSCULAR | Status: DC
  Administered 2015-01-20 – 2015-02-09 (×59): 5000 [IU] via SUBCUTANEOUS
  Filled 2015-01-20 (×65): qty 1

## 2015-01-20 MED ORDER — ACETAMINOPHEN 325 MG PO TABS: 325.0000 mg | ORAL_TABLET | ORAL | Status: DC | PRN

## 2015-01-20 MED ORDER — PRAVASTATIN SODIUM 40 MG PO TABS
40.0000 mg | ORAL_TABLET | Freq: Every day | ORAL | Status: DC
  Administered 2015-01-21 – 2015-02-09 (×21): 40 mg via ORAL
  Filled 2015-01-20 (×26): qty 1

## 2015-01-20 MED ORDER — METOPROLOL TARTRATE 25 MG PO TABS
25.0000 mg | ORAL_TABLET | Freq: Two times a day (BID) | ORAL | Status: DC
  Administered 2015-01-20 – 2015-02-10 (×42): 25 mg via ORAL
  Filled 2015-01-20 (×21): qty 1
  Filled 2015-01-20: qty 2
  Filled 2015-01-20 (×21): qty 1
  Filled 2015-01-20: qty 2
  Filled 2015-01-20 (×7): qty 1

## 2015-01-20 MED ORDER — LATANOPROST 0.005 % OP SOLN
1.0000 [drp] | Freq: Every day | OPHTHALMIC | Status: DC
  Administered 2015-01-20 – 2015-02-09 (×21): 1 [drp] via OPHTHALMIC
  Filled 2015-01-20 (×2): qty 2.5

## 2015-01-20 NOTE — PMR Pre-admission (Signed)
PMR Admission Coordinator Pre-Admission Assessment  Patient: Angela Keith is an 81 y.o., female MRN: [id number] DOB: 1929-09-08 Height:   Weight:                Insurance Information HMO: No    PPO:       PCP:       IPA:       80/20:       OTHER:   PRIMARY: Medicare A/B      Policy#: [id number]      Subscriber: [employer] CM Name:        Phone#:       Fax#:   Pre-Cert#:        Employer:  Retired Benefits:  Phone #:       Name: Checked in [place]. Date: 05/08/97     Deduct: $1288      Out of Pocket Max: None      Life Max: unlimited CIR: 100%      SNF: 100 days Outpatient: 80%     Co-Pay: 20% Home Health: 100%      Co-Pay: none DME: 80%     Co-Pay: 20% Providers: patient's choice  SECONDARY: Tricare for Life      Policy#: [id number]      Subscriber: Dellie Catholic CM Name:        Phone#:       Fax#:   Pre-Cert#:        Employer: Retired Benefits:  Phone #: [phone]     Name:   Eff. Date:      Deduct:        Out of Pocket Max:        Life Max:   CIR:        SNF:   Outpatient:       Co-Pay:   Home Health:        Co-Pay:   DME:       Co-Pay:    Emergency Contact Information Contact Information    Name Relation Home Work Mobile   Mila Merry [phone]    Roxy Horseman Relative [phone]  [phone]     Current Medical History  Patient Admitting Diagnosis: s/p lumbar decompression with post-op right ACA, left thalamic infarcts   History of Present Illness: An 81 y.o. right handed female with history of hypertension, TIA versus CVA March 2016 with MRI negative and patient was placed on dual antiplatelets therapy with carotid Dopplers and echocardiogram unremarkable and recent lumbar L2-3, 3-4 decompressive laminectomy for lumbar stenosis with radiculopathy 01/17/2015 per Dr. Jordan Likes and discharged to home same day. Presented 01/18/2015 a left sided weakness. MRI/MRA of the brain showed right distal ACA segment occlusion with acute posterior right ACA  infarct as well as punctate acute infarct medial left thalamus with no mass effect. Patient did not receive TPA. Cardiology consulted with attempts at TEE but was aborted because they were unable to pass the ultrasound probe after several attempts. Underwent loop recorder placement 01/19/2015.. Currently maintained on Plavix for CVA prophylaxis. Physical occupational therapy evaluations completed recommendations of physical medicine rehabilitation consult. Patient does wear a back brace for comfort after recent back surgery. Patient to be admitted for a comprehensive inpatient rehabilitation program.  Total: 8=NIH  Past Medical History  Past Medical History  Diagnosis Date  . Hypothyroidism   . Arthritis   . GERD (gastroesophageal reflux disease)   . Dysphagia   . Hyperlipidemia   . Right bundle branch block 03/21/2014  .  HTN (hypertension) 10/30/2012  . Unspecified hypothyroidism 10/30/2012  . Severe hearing loss   . DJD (degenerative joint disease), lumbar   . Rotoscoliosis   . Glaucoma   . DJD (degenerative joint disease), cervical   . Chronic neck pain   . Nocturia   . Insomnia   . Stroke   . Shortness of breath dyspnea     Family History  family history includes CAD in her brother and father; Diabetes in her mother; Lung cancer in her other; Stroke in her father.  Prior Rehab/Hospitalizations:  Has the patient had major surgery during 100 days prior to admission? No.  However did have a lumbar decompression on 01/17/15.  Current Medications   Current facility-administered medications:  .   stroke: mapping our early stages of recovery book, , Does not apply, Once, Jared M Gardner, DO .  acetaminophen (TYLENOL) tablet 325-650 mg, 325-650 mg, Oral, Q4H PRN, Marinus Maw, MD .  baclofen (LIORESAL) tablet 5 mg, 5 mg, Oral, BID, David L Rinehuls, PA-C, 5 mg at 01/20/15 1227 .  clopidogrel (PLAVIX) tablet 75 mg, 75 mg, Oral, Daily, Layne Benton, NP, 75 mg at 01/20/15 1038 .   dorzolamide-timolol (COSOPT) 22.3-6.8 MG/ML ophthalmic solution 1 drop, 1 drop, Both Eyes, BID, Jared M Gardner, DO, 1 drop at 01/20/15 1039 .  heparin injection 5,000 Units, 5,000 Units, Subcutaneous, 3 times per day, Marinus Maw, MD, 5,000 Units at 01/20/15 1357 .  HYDROcodone-acetaminophen (NORCO/VICODIN) 5-325 MG per tablet 0.5-2 tablet, 0.5-2 tablet, Oral, Q4H PRN, Hillary Bow, DO, 1 tablet at 01/19/15 1835 .  latanoprost (XALATAN) 0.005 % ophthalmic solution 1 drop, 1 drop, Both Eyes, QHS, Hillary Bow, DO, 1 drop at 01/19/15 2325 .  levothyroxine (SYNTHROID, LEVOTHROID) tablet 88 mcg, 88 mcg, Oral, QAC breakfast, Hillary Bow, DO, 88 mcg at 01/20/15 0856 .  metoprolol tartrate (LOPRESSOR) tablet 25 mg, 25 mg, Oral, BID, Jared M Gardner, DO, 25 mg at 01/20/15 1038 .  ondansetron (ZOFRAN) injection 4 mg, 4 mg, Intravenous, Q6H PRN, Marinus Maw, MD .  pantoprazole (PROTONIX) EC tablet 40 mg, 40 mg, Oral, BID, Jared M Gardner, DO, 40 mg at 01/20/15 1038 .  pravastatin (PRAVACHOL) tablet 40 mg, 40 mg, Oral, q1800, Hillary Bow, DO, 40 mg at 01/19/15 1835  Patients Current Diet: Diet Heart Room service appropriate?: Yes; Fluid consistency:: Thin  Precautions / Restrictions Precautions Precautions: Fall, Back Precaution Comments: daughter brought in lumbar corset from home, but did not fit, was from surgery in 2004 Restrictions Weight Bearing Restrictions: No   Has the patient had 2 or more falls or a fall with injury in the past year?Yes.  Patient has had 8 falls in the last 3 months.  She bumped her head and bruised her shoulder on one of the falls.  Prior Activity Level Limited Community (1-2x/wk): Up until a month ago, went out 3-4 X a week to [place], short distance driving, ran errands.  Home Assistive Devices / Equipment Home Equipment: Environmental consultant - 2 wheels, Wheelchair - manual, Shower seat  Prior Device Use: Indicate devices/aids used by the patient prior to current  illness, exacerbation or injury? Walker.  Using a RW.  Did use a cane in the past.  Prior Functional Level Prior Function Level of Independence: Independent  Self Care: Did the patient need help bathing, dressing, using the toilet or eating?  Independent  Indoor Mobility: Did the patient need assistance with walking from room to room (with  or without device)? Independent  Stairs: Did the patient need assistance with internal or external stairs (with or without device)? Independent  Functional Cognition: Did the patient need help planning regular tasks such as shopping or remembering to take medications? Independent.  Drove short distances on errands.  Her daughter did occasionally check her medications at home for accuracy.  Current Functional Level Cognition  Arousal/Alertness: Awake/alert Overall Cognitive Status: Impaired/Different from baseline Difficult to assess due to: Hard of hearing/deaf Current Attention Level: Focused Orientation Level: Oriented to person, Oriented to place, Oriented to situation, Disoriented to time General Comments: Patient extremely hard of hearing and easily distracted throughout session Attention: Sustained Sustained Attention: Appears intact Memory: Impaired Memory Impairment:  (did not recall SLP lengthy visit from two days prior, did recall SlP left to obtain straws for pt within 2 minutes) Problem Solving: Impaired Problem Solving Impairment: Functional basic (did not ask SLP to obtain her eye glasses or hearing aid initially, did state need to use call bell if need to use bathroom)    Extremity Assessment (includes Sensation/Coordination)  Upper Extremity Assessment: [name] deficits/detail [name] Deficits / Details: 3-/5 MMT shoulder flexion; decreased grip strenght, pt able to oppose fingers [name] Coordination: decreased fine motor, decreased gross motor  Lower Extremity Assessment: Generalized weakness, LLE deficits/detail LLE Deficits / Details:  flacid LLE no active motion noted LLE Sensation: decreased light touch, decreased proprioception LLE Coordination: decreased fine motor, decreased gross motor    ADLs  Overall ADL's : Needs assistance/impaired Eating/Feeding: NPO Grooming: Moderate assistance, Sitting (supported sitting) Upper Body Bathing: Moderate assistance, Sitting (supported sitting) Lower Body Bathing: Maximal assistance, Sit to/from stand, Adhering to back precautions Upper Body Dressing : Moderate assistance, Sitting Lower Body Dressing: Maximal assistance, Sit to/from stand, Adhering to back precautions Toilet Transfer: Maximal assistance, +2 for physical assistance Toileting- Clothing Manipulation and Hygiene: Sit to/from stand, Moderate assistance General ADL Comments: Pt limited by back pain    Mobility  Overal bed mobility: Needs Assistance Bed Mobility: Rolling Rolling: Max assist Sidelying to sit: Max assist Sit to sidelying: Max assist, +2 for physical assistance General bed mobility comments: Patient able to initiate rolling with Right side but requires assist for positioning and rolling to side lying, maximial assist to power to upright (some pushing with RUE noted) and assist for trunk and LEs to return to sidelying and reposition    Transfers  Overall transfer level: Needs assistance Transfers: Sit to/from Stand, Stand Pivot Transfers Sit to Stand: Max assist, From elevated surface Stand pivot transfers: Max assist, From elevated surface General transfer comment: stood from elevated bed with blocked left knee and cues for anterior weight shift, pt's right arm over PT shoulder; then returned to sit and stand pivot to chair cues for right UE to reach to opposite arm of chair and stood on right LE to pivot with left LE blocked    Ambulation / Gait / Stairs / Wheelchair Mobility  Ambulation/Gait General Gait Details: pregait activities in standing    Posture / Balance Dynamic Sitting  Balance Sitting balance - Comments: mod to max assist sitting balance; worked on upright posture/trunk and head control sitting edge of bed cues for head upright and chest out hold x 10 seconds x 3 w/support behind pelvis w/gait belt Balance Overall balance assessment: Needs assistance Sitting-balance support: Feet supported, Single extremity supported Sitting balance-Leahy Scale: Poor Sitting balance - Comments: mod to max assist sitting balance; worked on upright posture/trunk and head control sitting edge of bed  cues for head upright and chest out hold x 10 seconds x 3 w/support behind pelvis w/gait belt Postural control: Posterior lean, Left lateral lean Standing balance support: Single extremity supported Standing balance-Leahy Scale: Zero Standing balance comment: stood for lateral weight shifts in standing by edge of bed w/max assist and left LE braced by PT's knee and cues for head up to look over PT shoulder x about 30 seconds    Special needs/care consideration BiPAP/CPAP No CPM No Continuous Drip IV No Dialysis No        Life Vest No Oxygen Currently has 02 at 2L [place].  Did not use 02 at home. Special Bed No Trach Size No Wound Vac (area) No       Skin Has lumbar surgical incision.                             Bowel mgmt: Last BM per patient 01/16/15 Bladder mgmt: Urinary catheter in place for urinary retention. Diabetic mgmt No    Previous Home Environment Living Arrangements: Alone Available Help at Discharge: Family Type of Home: House Home Layout: Able to live on main level with bedroom/bathroom Home Access: Level entry Bathroom Shower/Tub: Tub/shower unit, Engineer, building services: Handicapped height  Discharge Living Setting Plans for Discharge Living Setting: Patient's home, Alone, House Type of Home at Discharge: House Discharge Home Layout: Two level, Able to live on main level with bedroom/bathroom Alternate Level Stairs-Number of Steps: Flight Discharge Home  Access: Stairs to enter [employer] of Steps: 1 small step up to deck entry.  Social/Family/Support Systems Patient Roles: Parent (Has son, dtr-in-law, sister, grand daughters. ) Contact Information: Roxy Horseman - daughter in law and BJ Zachery Dauer - son who is the sherriff. Anticipated Caregiver: family Anticipated Caregiver's Contact Information: See emergency contacts Ability/Limitations of Caregiver: Sister from [facility] may stay with patient.  Grandaughters and daughter in law may also assist.   Caregiver Availability: Other (Comment) (Dtr in law says she can get assistance as needed.) Discharge Plan Discussed with Primary Caregiver: Yes Is Caregiver In Agreement with Plan?: Yes Does Caregiver/Family have Issues with Lodging/Transportation while Pt is in Rehab?: No  Goals/Additional Needs Patient/Family Goal for Rehab: PT min assist, OT supervision to min assist, ST supervision goals Expected length of stay: 18-24 days Cultural Considerations: Watches Jay Schlichter on TV each week. Dietary Needs: Heart diet, thin liquids Equipment Needs: TBD Pt/Family Agrees to Admission and willing to participate: Yes Program Orientation Provided & Reviewed with Pt/Caregiver Including Roles  & Responsibilities: Yes  Decrease burden of Care through IP rehab admission:  N/A  Possible need for SNF placement upon discharge: Yes, if patient does not progress to level where family feels that they can manage at home.  Patient Condition: This patient's condition remains as documented in the consult dated 01/20/15, in which the Rehabilitation Physician determined and documented that the patient's condition is appropriate for intensive rehabilitative care in an inpatient rehabilitation facility. Will admit to inpatient rehab today.  Preadmission Screen Completed By:  [name], 01/20/2015 3:16 PM ______________________________________________________________________   Discussed status with  Dr. Riley Kill on 01/20/15 at 1516 and received telephone approval for admission today.  Admission Coordinator:  [name], time 1516/Date07/15/16

## 2015-01-20 NOTE — Discharge Summary (Signed)
Physician Discharge Summary  Angela Keith XBJ:[id number] DOB: 1929/07/28 DOA: 01/18/2015  PCP: [name], MD  Admit date: 01/18/2015 Discharge date: 01/20/2015  Time spent: greater than 30 minutes  Recommendations for Outpatient Follow-up:   to inpatient rehabilitation  Follow-up with [name] after discharge  Follow-up with Dr. 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  History of present illness/Hospital Course:  81 y.o. female with a past medical history significant for HTN, hyperlipidemia, presumed CVA (cerebral infarction) s/p IV tPA, MRI negative, GERD, who underwent uncomplicated lumbar decompressive laminectomy and foraminotomies on 7/12, brought in for further evaluation of intermittent left sided weakness, left face droop. Patient was doing well after discharge home, ambulating without assistance, but family indicated that day prior to admission around 4 pm (LKW 01/17/2015 at 1600) patient was noted to be intermittently dragging the left leg and complaining that the left leg was numb and weak. In addition, family also noted episodes in which her left arm was weak. Further, noted to have left face droopiness. As per patient and family, such episodes of left LE weakness can lasted for approximately one  hour. She said that the left leg was weaker than the right leg before surgery. Complains of a mild frontal HA but denies vertigo, double vision, difficulty swallowing, unsteadiness, slurred speech, confusion, fatigue, language or vision impairment. Having intermittent cramps right LE. CT brain was reviewed and showed no acute abnormality. It is worth noting that she has been off plavix for 4 days due to recent surgery. Patient was not administered TPA secondary to surgery 7/12 and mild intermittent deficits. Admitted to hospitalist service and neurology consulted. Dr. Jordan Likes is aware of patient's admission and wrote a note in the chart.  Stroke: Non-dominant right ACA and punctate L thalamic infarcts, embolic secondary to unknown source  Resultant Left sided hemiparesis, L facial weakness  MRI R ACA infarct, punctate L thalamic infarct  MRA L > R MCA M1 disease, R A2 occlusion (new since March), posterior circulation okay  Transthoracic Echocardiogram showed no source of embolus, difficult exam  Transesophageal echocardiogram was attempted, but unable to intubate the esophagus safely, so TEE was aborted.  LDL 137 in March  HgbA1c 6.0 in March  Carotid Dopplers in March showed no significant stenosis  Loop recorder was placed.  Patient was cleared by Dr. Jordan Likes to resume Plavix. Neurology recommends continuing Plavix.  Patient has worked with physical therapy occupational therapy and speech therapy and is stable for transfer to inpatient rehabilitation.  Hypertension  Antihypertensives were held in the setting of acute stroke.  Blood pressure has remained below 140 off medications except metoprolol. Therefore, have stopped her Norvasc and Hyzaar. May resume if blood pressure becomes uncontrolled.  Hyperlipidemia  Home meds: pravachol 40, resumed in hospital  LDL 137 in March, goal < 70  As already on a statin, will not recheck LDL as plan to continue statin  at  discharge  Severe spinal stenosis at L2-3 and L3-4 with neurogenic claudication s/p L2-3 L3-4 decompressive laminectomy with foraminotomies. 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 Robaxin did not help. Dr. [name] ordered low-dose baclofen.  Will need follow-up with Dr. Jordan Likes after discharge from inpatient rehabilitation.  Patient had a Foley catheter placed in the emergency room. I recommend taking it out, but family wishes to wait until she is transferred to rehabilitation. She had no urinary retention.  Procedures:  TEE attempted, unsuccessful.  Implantable loop recorder placed  Consultations:  Neurology  Cardiology  Electrophysiology  Physical medicine and rehabilitation  Discharge Exam: Filed Vitals:   01/20/15 1425  BP: 115/94  Pulse: 79  Temp: 99.1 F (37.3 C)  Resp: 16    General: In chair. Hard of hearing. Cardiovascular: Regular rate rhythm without murmurs gallops rubs Respiratory: Clear to auscultation bilaterally without wheezes rhonchi or rales Neurologic:  Left arm strength 3 out of 5. Left leg strength 0-5  Discharge Instructions   Discharge Instructions    Walk with assistance    Complete by:  As directed           Current Discharge Medication List    START taking these medications   Details  acetaminophen (TYLENOL) 325 MG tablet Take 1-2 tablets (325-650 mg total) by mouth every 4 (four) hours as needed for mild pain.      CONTINUE these medications which have NOT CHANGED   Details          Bilberry 1000 MG CAPS Take 100 mg by mouth daily.    calcium carbonate (OS-CAL) 600 MG TABS Take 600 mg by mouth daily.    Coenzyme Q10 (COQ10) 200 MG CAPS Take 200 mg by mouth daily.    dorzolamide-timolol (COSOPT) 22.3-6.8 MG/ML ophthalmic solution Place 1 drop into both eyes 2 (two) times daily.    Glucosamine-Chondroitin (MOVE FREE PO) Take 1 tablet by mouth 2 (two) times daily.     HYDROcodone-acetaminophen (NORCO/VICODIN) 5-325 MG per tablet Take 1-2 tablets by mouth every 4 (four) hours as needed (mild pain). Qty: 30 tablet, Refills: 0    latanoprost (XALATAN) 0.005 % ophthalmic solution Place 1 drop into both eyes at bedtime.    levothyroxine (SYNTHROID, LEVOTHROID) 88 MCG tablet Take 88 mcg by mouth daily before breakfast.    metoprolol tartrate (LOPRESSOR) 25 MG tablet Take 25 mg by mouth 2 (two) times daily.   Associated Diagnoses: Back pain with left-sided radiculopathy    Multiple Vitamins-Minerals (ICAPS PO) Take 1 tablet by mouth daily.     nitroGLYCERIN (NITROSTAT) 0.4 MG SL tablet Place 0.4 mg under the tongue as directed.    pantoprazole (PROTONIX) 40 MG tablet Take 40 mg by mouth 2 (two) times daily.   Associated Diagnoses: Back pain with left-sided radiculopathy    pravastatin (PRAVACHOL) 40 MG tablet Take 1 tablet (40 mg total) by mouth daily at 6 PM. Qty: 30 tablet, Refills: 2    clopidogrel (PLAVIX) 75 MG tablet Take 1 tablet (75 mg total) by mouth daily. Qty: 30 tablet, Refills: 2      STOP taking these medications     losartan-hydrochlorothiazide (HYZAAR) 50-12.5 MG per tablet        Allergies  Allergen Reactions  . Ace Inhibitors Other (See Comments)    Severe fatigue  . Lipitor [Atorvastatin] Other (See Comments)    Mood swings  . Motrin [Ibuprofen] Nausea And Vomiting  The results of significant diagnostics from this hospitalization (including imaging, microbiology, ancillary and laboratory) are listed below for reference.    Significant Diagnostic Studies: Dg Chest 2 View  01/18/2015   CLINICAL DATA:  Shortness of breath. Hypertension. Postoperative numbness.  EXAM: CHEST  2 VIEW  COMPARISON:  07/17/2009  FINDINGS: Shallow inspiration. Normal heart size and pulmonary vascularity. No focal airspace disease or consolidation in the lungs. No blunting of costophrenic angles. No pneumothorax. Calcified and tortuous aorta.  Bilateral breast implants. Degenerative changes in the spine.  IMPRESSION: Shallow inspiration.  No evidence of active pulmonary disease.   Electronically Signed   By: Burman NievesWilliam  Stevens M.D.   On: 01/18/2015 04:55   Ct Head Wo Contrast  01/18/2015   CLINICAL DATA:  TIA symptoms. Patient has surgery yesterday and was released. Family noticed left-sided facial droop and left-sided weakness of the upper and lower extremities around 1600 hours. Symptoms on off since then.  EXAM: CT HEAD WITHOUT CONTRAST  TECHNIQUE: Contiguous axial images were obtained from the base of the skull through the vertex without intravenous contrast.  COMPARISON:  MRI brain 09/06/2014.  CT head 09/05/2014.  FINDINGS: Diffuse cerebral atrophy. Low-attenuation changes in the deep white matter consistent with small vessel ischemia. Old lacune in the left basal ganglia. No mass effect or midline shift. No abnormal extra-axial fluid collections. Gray-white matter junctions are distinct. Basal cisterns are not effaced. No evidence of acute intracranial hemorrhage. No depressed skull fractures. Visualized paranasal sinuses and mastoid air cells are not opacified. Vascular calcifications.  IMPRESSION: No acute intracranial abnormalities. Chronic atrophy and small vessel ischemic changes. Old lacunar infarct in the left basal ganglia. No change since prior study.   Electronically Signed   By: Burman NievesWilliam  Stevens M.D.   On: 01/18/2015 04:49   Mr Shirlee LatchMra Head ZOWo Contrast  01/18/2015   ADDENDUM REPORT: 01/18/2015 07:56  ADDENDUM: Study discussed by telephone with Dr. Thana FarrLeslie Reynolds on 01/18/2015 at 0744 hours.   Electronically Signed   By: Odessa FlemingH  Hall M.D.   On: 01/18/2015 07:56   01/18/2015   CLINICAL DATA:  [age] year old female status post recent lumbar surgery with acute left side weakness and facial droop. Initial encounter.  EXAM: MRI HEAD WITHOUT CONTRAST  MRA HEAD WITHOUT CONTRAST  TECHNIQUE: Multiplanar, multiecho pulse sequences of the brain and  surrounding structures were obtained without intravenous contrast. Angiographic images of the head were obtained using MRA technique without contrast.  COMPARISON:  Head CT without contrast 0418 hours today. Brain MRI and intracranial MRA 09/06/2014.  FINDINGS: MRI HEAD FINDINGS  Confluent 2.5 cm area of restricted diffusion in the posterior right ACA territory, with patchy involvement of the medial motor strip. Patchy right cingulate gyrus involvement. Subtle associated T2 and FLAIR hyperintensity. No definite associated hemorrhage. No mass effect.  Superimposed punctate restricted diffusion along the medial left thalamus (series 7, image 14). No other left hemisphere restricted diffusion identified. No posterior fossa restricted diffusion identified.  Major intracranial vascular flow voids are stable at the skullbase. Loss of the distal right ACA flow void with associated increased FLAIR signal, see MRA findings below.  Scattered round areas of T2* susceptibility appears related to the subarachnoid space, and corresponds to several small foci of very low density (negative 200-300 Hounsfield units) on the head CT ear earlier today.  Stable gray and white matter signal elsewhere. No ventriculomegaly. No midline shift. Basilar cisterns are patent. No extra-axial collection. No intracranial mass lesion. Pituitary and cervicomedullary junction appear stable. Normal bone  marrow signal.  Internal auditory structures, paranasal sinuses, mastoids, orbits, and scalp soft tissues are stable.  MRA HEAD FINDINGS  Stable antegrade flow in the distal vertebral arteries. No distal vertebral or basilar artery stenosis. PICA origins remain patent. Stable SCA and PCA origins. Stable bilateral PCA branches, with mild to moderate irregularity and stenosis of the right P2 segment but preserved distal flow. Posterior communicating arteries are diminutive or absent.  Stable antegrade flow in both ICA siphons. Carotid termini remain  patent. MCA and ACA origins are stable and within normal limits.  Increased irregularity of the bilateral MCA M1 segments and right ACA A1 segment, might in part be related to mild motion artifact today. Evidence of moderate to severe stenosis now in the left M1 segment. However, both MCA bifurcations remain patent. No M2 branch occlusion is identified, with bilateral M2 branch irregularity re- identified.  The left ACA appears stable. The right ACA A1 segment is mildly irregular, an the right A2 segment is occluded in the pericallosal artery region. See series 4, images 118-123).  IMPRESSION: 1. Right ACA distal A2 segment occlusion with acute posterior right ACA infarct, involving some of the medial motor strip. Mild cytotoxic edema without hemorrhage or mass effect. 2. Increased left worse than right MCA M1 segment irregularity may reflect new thromboembolic disease. No major MCA branch occlusion. 3. Punctate acute infarct in the medial left thalamus with no mass effect or hemorrhage. The posterior circulation appears stable since March. 4. Scattered small areas of susceptibility appear related to small very low density foci in the subarachnoid space on this CT earlier today. Favor trace pneumocephalus, presumably arising from the recent lumbar surgery.  Electronically Signed: By: Odessa Fleming M.D. On: 01/18/2015 07:39   Mr Brain Wo Contrast  01/18/2015   ADDENDUM REPORT: 01/18/2015 07:56  ADDENDUM: Study discussed by telephone with Dr. Thana Farr on 01/18/2015 at 0744 hours.   Electronically Signed   By: Odessa Fleming M.D.   On: 01/18/2015 07:56   01/18/2015   CLINICAL DATA:  81 year old female status post recent lumbar surgery with acute left side weakness and facial droop. Initial encounter.  EXAM: MRI HEAD WITHOUT CONTRAST  MRA HEAD WITHOUT CONTRAST  TECHNIQUE: Multiplanar, multiecho pulse sequences of the brain and surrounding structures were obtained without intravenous contrast. Angiographic images of the  head were obtained using MRA technique without contrast.  COMPARISON:  Head CT without contrast 0418 hours today. Brain MRI and intracranial MRA 09/06/2014.  FINDINGS: MRI HEAD FINDINGS  Confluent 2.5 cm area of restricted diffusion in the posterior right ACA territory, with patchy involvement of the medial motor strip. Patchy right cingulate gyrus involvement. Subtle associated T2 and FLAIR hyperintensity. No definite associated hemorrhage. No mass effect.  Superimposed punctate restricted diffusion along the medial left thalamus (series 7, image 14). No other left hemisphere restricted diffusion identified. No posterior fossa restricted diffusion identified.  Major intracranial vascular flow voids are stable at the skullbase. Loss of the distal right ACA flow void with associated increased FLAIR signal, see MRA findings below.  Scattered round areas of T2* susceptibility appears related to the subarachnoid space, and corresponds to several small foci of very low density (negative 200-300 Hounsfield units) on the head CT ear earlier today.  Stable gray and white matter signal elsewhere. No ventriculomegaly. No midline shift. Basilar cisterns are patent. No extra-axial collection. No intracranial mass lesion. Pituitary and cervicomedullary junction appear stable. Normal bone marrow signal.  Internal auditory structures, paranasal sinuses, mastoids, orbits,  and scalp soft tissues are stable.  MRA HEAD FINDINGS  Stable antegrade flow in the distal vertebral arteries. No distal vertebral or basilar artery stenosis. PICA origins remain patent. Stable SCA and PCA origins. Stable bilateral PCA branches, with mild to moderate irregularity and stenosis of the right P2 segment but preserved distal flow. Posterior communicating arteries are diminutive or absent.  Stable antegrade flow in both ICA siphons. Carotid termini remain patent. MCA and ACA origins are stable and within normal limits.  Increased irregularity of the  bilateral MCA M1 segments and right ACA A1 segment, might in part be related to mild motion artifact today. Evidence of moderate to severe stenosis now in the left M1 segment. However, both MCA bifurcations remain patent. No M2 branch occlusion is identified, with bilateral M2 branch irregularity re- identified.  The left ACA appears stable. The right ACA A1 segment is mildly irregular, an the right A2 segment is occluded in the pericallosal artery region. See series 4, images 118-123).  IMPRESSION: 1. Right ACA distal A2 segment occlusion with acute posterior right ACA infarct, involving some of the medial motor strip. Mild cytotoxic edema without hemorrhage or mass effect. 2. Increased left worse than right MCA M1 segment irregularity may reflect new thromboembolic disease. No major MCA branch occlusion. 3. Punctate acute infarct in the medial left thalamus with no mass effect or hemorrhage. The posterior circulation appears stable since March. 4. Scattered small areas of susceptibility appear related to small very low density foci in the subarachnoid space on this CT earlier today. Favor trace pneumocephalus, presumably arising from the recent lumbar surgery.  Electronically Signed: By: Odessa Fleming M.D. On: 01/18/2015 07:39   Dg Lumbar Spine 1 View  01/17/2015   CLINICAL DATA:  Lumbar spine surgery.  EXAM: LUMBAR SPINE - 1 VIEW  COMPARISON:  MRI 12/21/2014 .  FINDINGS: Posterior and interbody fusion L4-L5. Metallic marker is noted along the posterior aspect of L4 . Lumbar vertebra numbered as per prior MRI of 12/21/2014.  IMPRESSION: Metallic marker noted along the posterior aspect of L4.   Electronically Signed   By: Maisie Fus  Register   On: 01/17/2015 09:58    Microbiology: Recent Results (from the past 240 hour(s))  Surgical pcr screen     Status: None   Collection Time: 01/11/15  1:33 PM  Result Value Ref Range Status   MRSA, PCR NEGATIVE NEGATIVE Final   Staphylococcus aureus NEGATIVE NEGATIVE Final     Comment:        The Xpert SA Assay (FDA approved for NASAL specimens in patients over 63 years of age), is one component of a comprehensive surveillance program.  Test performance has been validated by [facility] for patients greater than or equal to 17 year old. It is not intended to diagnose infection nor to guide or monitor treatment.      Labs: Basic Metabolic Panel:  Recent Labs Lab 01/18/15 0341 01/18/15 0349 01/19/15 0452 01/19/15 1705  NA 134* 136 133*  --   K 3.9 3.8 3.6  --   CL 99* 98* 98*  --   CO2 27  --  25  --   GLUCOSE 118* 115* 121*  --   BUN 14 16 11   --   CREATININE 1.06* 1.00 0.82 0.83  CALCIUM 9.6  --  9.0  --    Liver Function Tests:  Recent Labs Lab 01/18/15 0341  AST 27  ALT 17  ALKPHOS 47  BILITOT 0.7  PROT 6.7  ALBUMIN 3.8   No results for input(s): LIPASE, AMYLASE in the last 168 hours. No results for input(s): AMMONIA in the last 168 hours. CBC:  Recent Labs Lab 01/18/15 0341 01/18/15 0349 01/19/15 1705  WBC 10.9*  --  10.3  NEUTROABS 7.9*  --   --   HGB 11.5* 13.3 12.3  HCT 34.8* 39.0 36.6  MCV 85.3  --  86.5  PLT 224  --  224   Cardiac Enzymes: No results for input(s): CKTOTAL, CKMB, CKMBINDEX, TROPONINI in the last 168 hours. BNP: BNP (last 3 results) No results for input(s): BNP in the last 8760 hours.  ProBNP (last 3 results) No results for input(s): PROBNP in the last 8760 hours.  CBG: No results for input(s): GLUCAP in the last 168 hours.     SignedChristiane Ha  [facility] 01/20/2015, 3:52 PM

## 2015-01-20 NOTE — Progress Notes (Signed)
STROKE TEAM PROGRESS NOTE   HISTORY Angela Keith is a 81 y.o. female with a past medical history significant for HTN, hyperlipidemia, presumed CVA (cerebral infarction) s/p IV tPA, MRI negative, GERD, who underwent uncomplicated lumbar decompressive laminectomy and foraminotomies on 7/12, brought in for further evaluation of intermittent left sided weakness, left face droop. Patient was doing well after discharge home, ambulating without assistance, but family indicated that yesterday around 4 pm (LKW 01/17/2015 at 1600) patient was noted to be intermittently dragging the left leg and complaining that the left leg was numb and weak. In addition, family also noted episodes in which her left arm was weak. Further, noted to have left face droopiness. As per patient and family, such episodes of left LE weakness can lasted for approximately one hour. She said that the left leg was weaker than the right leg before surgery. Complains of a mild frontal HA but denies vertigo, double vision, difficulty swallowing, unsteadiness, slurred speech, confusion, fatigue, language or vision impairment. Having intermittent cramps right LE. CT brain was reviewed and showed no acute abnormality. It is worth noting that she has been off plavix for 4 days due to recent surgery. Patient was not administered TPA secondary to surgery 7/12 and mild intermittent deficits. She was admitted for further evaluation and treatment.   SUBJECTIVE (INTERVAL HISTORY) Her daughter Angela Keith and family are at the bedside. She is feeling better. She did not complete TEE yesterday as she has esophageal stricture and had trouble swallowing. She had loop recorder placed OBJECTIVE Temp:  [97.8 F (36.6 C)-99.2 F (37.3 C)] 98.4 F (36.9 C) (07/15 1112) Pulse Rate:  [68-86] 79 (07/15 1112) Cardiac Rhythm:  [-] Normal sinus rhythm (07/14 2130) Resp:  [8-24] 16 (07/15 1112) BP: (110-179)/(50-92) 118/54 mmHg (07/15 1112) SpO2:  [96 %-100 %] 99  % (07/15 1112)  No results for input(s): GLUCAP in the last 168 hours.  Recent Labs Lab 01/18/15 0341 01/18/15 0349 01/19/15 0452 01/19/15 1705  NA 134* 136 133*  --   K 3.9 3.8 3.6  --   CL 99* 98* 98*  --   CO2 27  --  25  --   GLUCOSE 118* 115* 121*  --   BUN 14 16 11   --   CREATININE 1.06* 1.00 0.82 0.83  CALCIUM 9.6  --  9.0  --     Recent Labs Lab 01/18/15 0341  AST 27  ALT 17  ALKPHOS 47  BILITOT 0.7  PROT 6.7  ALBUMIN 3.8    Recent Labs Lab 01/18/15 0341 01/18/15 0349 01/19/15 1705  WBC 10.9*  --  10.3  NEUTROABS 7.9*  --   --   HGB 11.5* 13.3 12.3  HCT 34.8* 39.0 36.6  MCV 85.3  --  86.5  PLT 224  --  224   No results for input(s): CKTOTAL, CKMB, CKMBINDEX, TROPONINI in the last 168 hours.  Recent Labs  01/18/15 0341 01/19/15 0452  LABPROT 14.0 14.5  INR 1.06 1.11    Recent Labs  01/18/15 0513  COLORURINE YELLOW  LABSPEC 1.009  PHURINE 7.0  GLUCOSEU NEGATIVE  HGBUR NEGATIVE  BILIRUBINUR NEGATIVE  KETONESUR NEGATIVE  PROTEINUR NEGATIVE  UROBILINOGEN 0.2  NITRITE NEGATIVE  LEUKOCYTESUR NEGATIVE       Component Value Date/Time   CHOL 122 01/19/2015 0452   TRIG 20 01/19/2015 0452   HDL 52 01/19/2015 0452   CHOLHDL 2.3 01/19/2015 0452   VLDL 4 01/19/2015 0452   LDLCALC 66 01/19/2015 0452  Lab Results  Component Value Date   HGBA1C 6.1* 01/19/2015      Component Value Date/Time   LABOPIA NONE DETECTED 09/05/2014 1149   COCAINSCRNUR NONE DETECTED 09/05/2014 1149   LABBENZ NONE DETECTED 09/05/2014 1149   AMPHETMU NONE DETECTED 09/05/2014 1149   THCU NONE DETECTED 09/05/2014 1149   LABBARB NONE DETECTED 09/05/2014 1149    No results for input(s): ETH in the last 168 hours.  Dg Chest 2 View 01/18/2015    Shallow inspiration.  No evidence of active pulmonary disease.     Ct Head Wo Contrast 01/18/2015   No acute intracranial abnormalities. Chronic atrophy and small vessel ischemic changes. Old lacunar infarct in the left  basal ganglia. No change since prior study.     MRI & MRA Brain Wo Contrast 01/18/2015    1. Right ACA distal A2 segment occlusion with acute posterior right ACA infarct, involving some of the medial motor strip. Mild cytotoxic edema without hemorrhage or mass effect. 2. Increased left worse than right MCA M1 segment irregularity may reflect new thromboembolic disease. No major MCA branch occlusion. 3. Punctate acute infarct in the medial left thalamus with no mass effect or hemorrhage. The posterior circulation appears stable since March. 4. Scattered small areas of susceptibility appear related to small very low density foci in the subarachnoid space on this CT earlier today. Favor trace pneumocephalus, presumably arising from the recent lumbar surgery.    Dg Lumbar Spine 1 View 01/17/2015    Metallic marker noted along the posterior aspect of L4.       PHYSICAL EXAM Pleasant elderly caucasian lady not in distress. . Afebrile. Head is nontraumatic. Neck is supple without bruit.    Cardiac exam no murmur or gallop. Lungs are clear to auscultation. Distal pulses are well felt. Neurological Exam :  Awake alert oriented x 3 normal speech and language. Mild left lower face asymmetry. Tongue midline. No drift. Mild diminished fine finger movements on left. Orbits right over left upper extremity. Mild left grip weak..LLe 3/5 strength Diminished left hemibody sensation . Normal coordination. ASSESSMENT/PLAN [name] is a 81 y.o. female with history of HTN, hyperlipidemia, presumed CVA (cerebral infarction) s/p IV tPA, MRI negative, GERD, who underwent uncomplicated lumbar decompressive laminectomy and foraminotomies on 7/12, brought in for further evaluation of intermittent left sided weakness, left face droop. She did not receive IV t-PA due to recent surgery and mild intermittent deficits.   Stroke:  Non-dominant right ACA and punctate L thalamic infarcts,  embolic secondary to unknown  source  Resultant  Left sided hemiparesis, L facial weakness  MRI  R ACA infarct, punctate L thalamic infarct  MRA  L > R MCA M1 disease, R A2 occlusion (new since March), posterior circulation okay TEE to look for embolic source. Arranged with [facility] for tomorrow.  If positive for PFO (patent foramen ovale), check bilateral lower extremity venous dopplers to rule out DVT as possible source of stroke. (I have made patient NPO after midnight tonight).  If TEE negative, a  [facility] electrophysiologist will consult and consider placement of an implantable loop recorder to evaluate for atrial fibrillation as etiology of stroke. This has been explained to patient/family by Dr. Pearlean BrownieSethi and they are agreeable.   LDL 137 in March  HgbA1c 6.0 in March  No VTE prophylaxis noted Diet Heart Room service appropriate?: Yes; Fluid consistency:: Thin  clopidogrel 75 mg orally every day prior to admission (  had been on dual antiplatelets x 3 mos following stroke in March), now on no antithrombotics ok to resume Plavix from surgeon standpoint. Have reordered  Ongoing aggressive stroke risk factor management  Therapy recommendations:  CLR  Disposition:  CLR  Hypertension  Stable  Hyperlipidemia  Home meds:  pravachol 40, resumed in hospital  LDL 137 in March, goal < 70  As already on a statin, will not recheck LDL as plan to continue statin at discharge  Other Stroke Risk Factors  Advanced age  Hx stroke/TIA - presumed CVA (cerebral infarction) s/p IV tPA, MRI negative,  Family hx stroke (father)  Other Active Problems  Severe spinal stenosis at L2-3 and L3-4 with neurogenic claudication s/p L2-3 L3-4 decompressive laminectomy with foraminotomies  Hospital day # 2  Joss Mcdill  [facility] See Amion for Pager information 01/20/2015 1:21 PM   Continue Plavix for now. Follow-up as an outpatient in 2 months. Transfer to  rehabilitation for the next couple of days. May consider possible participation in the RESPECT ESUS trial if interested. D/w patient, daughetr and answered questions.  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 We'll try low-dose baclofen first and if not effective switch  to clonazepam Delia Heady, MD Medical Director [facility] Pager: [phone] 01/20/2015 1:21 PM    To contact Stroke Continuity provider, please refer to [email or web address]. After hours, contact General Neurology

## 2015-01-20 NOTE — Progress Notes (Signed)
Ranelle Oyster, MD Physician Signed Physical Medicine and Rehabilitation Consult Note 01/19/2015 1:42 PM  Related encounter: ED to Hosp-Admission (Discharged) from 01/18/2015 in [facility]    Expand All Collapse All        Physical Medicine and Rehabilitation Consult Reason for Consult: Right ACA and punctate left thalamic infarcts Referring Physician: [facility]   HPI: Angela Keith is a 81 y.o. right handed female with history of hypertension, TIA versus CVA March 2016 with MRI negative and patient was placed on dual antiplatelets therapy with carotid Dopplers and echocardiogram unremarkable and recent lumbar L2-3, 3-4 decompressive laminectomy for lumbar stenosis with radiculopathy 01/17/2015 per Dr. Jordan Likes and discharged to home same day. Presented 01/18/2015 a left sided weakness. MRI/MRA of the brain showed right distal ACA segment occlusion with acute posterior right ACA infarct as well as punctate acute infarct medial left thalamus with no mass effect. Patient did not receive TPA. Cardiology consulted with attempts at TEE but was aborted because they were unable to pass the ultrasound probe after several attempts. Underwent loop recorder placement 01/19/2015.. Currently maintained on Plavix for CVA prophylaxis. Physical occupational therapy evaluations completed recommendations of physical medicine rehabilitation consult. Patient does wear a back brace for comfort after recent back surgery.   Review of Systems  Constitutional: Negative for fever and chills.  HENT: Positive for hearing loss.  Eyes: Negative for blurred vision and double vision.  Respiratory:   Shortness of breath with exertion  Cardiovascular: Negative for chest pain and palpitations.  Gastrointestinal: Positive for heartburn and constipation.   GERD  Genitourinary: Negative for urgency.  Musculoskeletal: Positive for back pain and neck pain.  Skin: Negative for rash.    Neurological: Positive for weakness and headaches.  Psychiatric/Behavioral: The patient has insomnia.   Past Medical History  Diagnosis Date  . Hypothyroidism   . Arthritis   . GERD (gastroesophageal reflux disease)   . Dysphagia   . Hyperlipidemia   . Right bundle branch block 03/21/2014  . HTN (hypertension) 10/30/2012  . Unspecified hypothyroidism 10/30/2012  . Severe hearing loss   . DJD (degenerative joint disease), lumbar   . Rotoscoliosis   . Glaucoma   . DJD (degenerative joint disease), cervical   . Chronic neck pain   . Nocturia   . Insomnia   . Stroke   . Shortness of breath dyspnea    Past Surgical History  Procedure Laterality Date  . Breast surgery    . Back surgery      lower back   . Esophagogastroduodenoscopy (egd) with esophageal dilation  06/08/2012    Procedure: ESOPHAGOGASTRODUODENOSCOPY (EGD) WITH ESOPHAGEAL DILATION; Surgeon: Charolett Bumpers, MD; Location: [facility]; Service: Endoscopy; Laterality: N/A;  . Bladder laser surgery    . Left carpal tunnel surgery    . Facail surgery  2000    facelift and eyelid lift  . Cataract surgery    . Lumbar laminectomy/decompression microdiscectomy N/A 01/17/2015    Procedure: Laminectomy and Foraminotomy - L2-L3 - L3-L4; Surgeon: [name], MD; Location: [facility]; Service: Neurosurgery; Laterality: N/A; Laminectomy and Foraminotomy - L2-L3 - L3-L4   Family History  Problem Relation Age of Onset  . CAD Father   . Stroke Father   . CAD Brother   . Diabetes Mother   . Lung cancer Other    Social History:  reports that she has never smoked. She does not have any smokeless tobacco history on file. She reports that  she does not drink alcohol or use illicit drugs. Allergies:  Allergies  Allergen Reactions  . Ace Inhibitors Other (See Comments)    Severe  fatigue  . Lipitor [Atorvastatin] Other (See Comments)    Mood swings  . Motrin [Ibuprofen] Nausea And Vomiting   Medications Prior to Admission  Medication Sig Dispense Refill  . amLODipine (NORVASC) 5 MG tablet Take 5 mg by mouth daily.     . Bilberry 1000 MG CAPS Take 100 mg by mouth daily.    . calcium carbonate (OS-CAL) 600 MG TABS Take 600 mg by mouth daily.    . Coenzyme Q10 (COQ10) 200 MG CAPS Take 200 mg by mouth daily.    . dorzolamide-timolol (COSOPT) 22.3-6.8 MG/ML ophthalmic solution Place 1 drop into both eyes 2 (two) times daily.    . Glucosamine-Chondroitin (MOVE FREE PO) Take 1 tablet by mouth 2 (two) times daily.    Marland Kitchen HYDROcodone-acetaminophen (NORCO/VICODIN) 5-325 MG per tablet Take 1-2 tablets by mouth every 4 (four) hours as needed (mild pain). (Patient taking differently: Take 0.5-2 tablets by mouth every 4 (four) hours as needed (mild pain). ) 30 tablet 0  . latanoprost (XALATAN) 0.005 % ophthalmic solution Place 1 drop into both eyes at bedtime.    Marland Kitchen levothyroxine (SYNTHROID, LEVOTHROID) 88 MCG tablet Take 88 mcg by mouth daily before breakfast.    . losartan-hydrochlorothiazide (HYZAAR) 50-12.5 MG per tablet Take 1 tablet by mouth daily.    . metoprolol tartrate (LOPRESSOR) 25 MG tablet Take 25 mg by mouth 2 (two) times daily.    . Multiple Vitamins-Minerals (ICAPS PO) Take 1 tablet by mouth daily.     . nitroGLYCERIN (NITROSTAT) 0.4 MG SL tablet Place 0.4 mg under the tongue as directed.    . pantoprazole (PROTONIX) 40 MG tablet Take 40 mg by mouth 2 (two) times daily.    . pravastatin (PRAVACHOL) 40 MG tablet Take 1 tablet (40 mg total) by mouth daily at 6 PM. 30 tablet 2  . clopidogrel (PLAVIX) 75 MG tablet Take 1 tablet (75 mg total) by mouth daily. 30 tablet 2    Home: Home Living Family/patient expects to be discharged to:: Private residence Living Arrangements:  Alone Type of Home: House Home Access: Level entry Home Layout: Able to live on main level with bedroom/bathroom Bathroom Shower/Tub: Tub/shower unit, Curtain Bathroom Toilet: Handicapped height Home Equipment: Environmental consultant - 2 wheels, Wheelchair - manual, Shower seat  Functional History: Prior Function Level of Independence: Independent Functional Status:  Mobility: Bed Mobility Overal bed mobility: Needs Assistance Bed Mobility: Rolling, Sidelying to Sit, Sit to Sidelying Rolling: Mod assist, +2 for physical assistance Sidelying to sit: Max assist Sit to sidelying: Max assist, +2 for physical assistance General bed mobility comments: Patient able to initiate rolling with Right side but requires assist for positioning and rolling to side lying, maximial assist to power to upright (some pushing with RUE noted) and assist for trunk and LEs to return to sidelying and reposition Transfers General transfer comment: unable to perform Ambulation/Gait General Gait Details: unable to perform    ADL: ADL Overall ADL's : Needs assistance/impaired Eating/Feeding: NPO Grooming: Moderate assistance, Sitting (supported sitting) Upper Body Bathing: Moderate assistance, Sitting (supported sitting) Lower Body Bathing: Maximal assistance, Sit to/from stand, Adhering to back precautions Upper Body Dressing : Moderate assistance, Sitting Lower Body Dressing: Maximal assistance, Sit to/from stand, Adhering to back precautions Toilet Transfer: Maximal assistance, +2 for physical assistance Toileting- Clothing Manipulation and Hygiene: Sit to/from stand, Moderate  assistance General ADL Comments: Pt limited by back pain  Cognition: Cognition Overall Cognitive Status: Difficult to assess Orientation Level: Oriented X4 Cognition Arousal/Alertness: Awake/alert Behavior During Therapy: Flat affect Overall Cognitive Status: Difficult to assess Area of Impairment: Attention, Problem solving Current  Attention Level: Focused Memory: Decreased recall of precautions, Decreased short-term memory Problem Solving: Slow processing, Decreased initiation, Difficulty sequencing, Requires verbal cues, Requires tactile cues General Comments: Patient extremely hard of hearing and easily distracted throughout session Difficult to assess due to: Hard of hearing/deaf  Blood pressure 142/88, pulse 80, temperature 98.7 F (37.1 C), temperature source Oral, resp. rate 20, SpO2 100 %. Physical Exam  Constitutional:  81 year old right-handed female that is severely hard of hearing of bilateral hearing aids.  Eyes: EOM are normal.  Neck: Normal range of motion. Neck supple. No thyromegaly present.  Cardiovascular: Normal rate and regular rhythm.  Respiratory: Effort normal and breath sounds normal. No respiratory distress.  GI: Soft. Bowel sounds are normal. She exhibits no distension.  Neurological: She is alert.  Patient did not have her hearing aids in place during exam. She was able to provide her name and place. Follows simple commands. HOH. [name] spastic with underlying 1-2/5 strength. LLE: 1-2 hf, ke and 1 ankle. Likely some apraxia as well as inattention to left also.  Skin: Skin is warm and dry.  Psychiatric: She has a normal mood and affect.     Lab Results Last 24 Hours    Results for orders placed or performed during the hospital encounter of 01/18/15 (from the past 24 hour(s))  Lipid panel Status: None   Collection Time: 01/19/15 4:52 AM  Result Value Ref Range   Cholesterol 122 0 - 200 mg/dL   Triglycerides 20 <161 mg/dL   HDL 52 >09 mg/dL   Total CHOL/HDL Ratio 2.3 RATIO   VLDL 4 0 - 40 mg/dL   LDL Cholesterol 66 0 - 99 mg/dL  Basic metabolic panel Status: Abnormal   Collection Time: 01/19/15 4:52 AM  Result Value Ref Range   Sodium 133 (L) 135 - 145 mmol/L   Potassium 3.6 3.5 - 5.1 mmol/L   Chloride 98 (L) 101 - 111  mmol/L   CO2 25 22 - 32 mmol/L   Glucose, Bld 121 (H) 65 - 99 mg/dL   BUN 11 6 - 20 mg/dL   Creatinine, Ser 6.04 0.44 - 1.00 mg/dL   Calcium 9.0 8.9 - 54.0 mg/dL   GFR calc non Af Amer >60 >60 mL/min   GFR calc Af Amer >60 >60 mL/min   Anion gap 10 5 - 15  Protime-INR Status: None   Collection Time: 01/19/15 4:52 AM  Result Value Ref Range   Prothrombin Time 14.5 11.6 - 15.2 seconds   INR 1.11 0.00 - 1.49      Imaging Results (Last 48 hours)    Dg Chest 2 View  01/18/2015 CLINICAL DATA: Shortness of breath. Hypertension. Postoperative numbness. EXAM: CHEST 2 VIEW COMPARISON: 07/17/2009 FINDINGS: Shallow inspiration. Normal heart size and pulmonary vascularity. No focal airspace disease or consolidation in the lungs. No blunting of costophrenic angles. No pneumothorax. Calcified and tortuous aorta. Bilateral breast implants. Degenerative changes in the spine. IMPRESSION: Shallow inspiration. No evidence of active pulmonary disease. Electronically Signed By: Burman Nieves M.D. On: 01/18/2015 04:55   Ct Head Wo Contrast  01/18/2015 CLINICAL DATA: TIA symptoms. Patient has surgery yesterday and was released. Family noticed left-sided facial droop and left-sided weakness of the upper and lower extremities around  1600 hours. Symptoms on off since then. EXAM: CT HEAD WITHOUT CONTRAST TECHNIQUE: Contiguous axial images were obtained from the base of the skull through the vertex without intravenous contrast. COMPARISON: MRI brain 09/06/2014. CT head 09/05/2014. FINDINGS: Diffuse cerebral atrophy. Low-attenuation changes in the deep white matter consistent with small vessel ischemia. Old lacune in the left basal ganglia. No mass effect or midline shift. No abnormal extra-axial fluid collections. Gray-white matter junctions are distinct. Basal cisterns are not effaced. No evidence of acute intracranial hemorrhage. No depressed  skull fractures. Visualized paranasal sinuses and mastoid air cells are not opacified. Vascular calcifications. IMPRESSION: No acute intracranial abnormalities. Chronic atrophy and small vessel ischemic changes. Old lacunar infarct in the left basal ganglia. No change since prior study. Electronically Signed By: Burman Nieves M.D.  On: 01/18/2015 04:49   Mr Shirlee Latch ZO Contrast  01/18/2015 ADDENDUM REPORT: 01/18/2015 07:56 ADDENDUM: Study discussed by telephone with Dr. Thana Farr on 01/18/2015 at 0744 hours. Electronically Signed By: Odessa Fleming M.D. On: 01/18/2015 07:56   01/18/2015 CLINICAL DATA: 81 year old female status post recent lumbar surgery with acute left side weakness and facial droop. Initial encounter. EXAM: MRI HEAD WITHOUT CONTRAST MRA HEAD WITHOUT CONTRAST TECHNIQUE: Multiplanar, multiecho pulse sequences of the brain and surrounding structures were obtained without intravenous contrast. Angiographic images of the head were obtained using MRA technique without contrast. COMPARISON: Head CT without contrast 0418 hours today. Brain MRI and intracranial MRA 09/06/2014. FINDINGS: MRI HEAD FINDINGS Confluent 2.5 cm area of restricted diffusion in the posterior right ACA territory, with patchy involvement of the medial motor strip. Patchy right cingulate gyrus involvement. Subtle associated T2 and FLAIR hyperintensity. No definite associated hemorrhage. No mass effect. Superimposed punctate restricted diffusion along the medial left thalamus (series 7, image 14). No other left hemisphere restricted diffusion identified. No posterior fossa restricted diffusion identified. Major intracranial vascular flow voids are stable at the skullbase. Loss of the distal right ACA flow void with associated increased FLAIR signal, see MRA findings below. Scattered round areas of T2* susceptibility appears related to the subarachnoid space, and corresponds to several small foci of very  low density (negative 200-300 Hounsfield units) on the head CT ear earlier today. Stable gray and white matter signal elsewhere. No ventriculomegaly. No midline shift. Basilar cisterns are patent. No extra-axial collection. No intracranial mass lesion. Pituitary and cervicomedullary junction appear stable. Normal bone marrow signal. Internal auditory structures, paranasal sinuses, mastoids, orbits, and scalp soft tissues are stable. MRA HEAD FINDINGS Stable antegrade flow in the distal vertebral arteries. No distal vertebral or basilar artery stenosis. PICA origins remain patent. Stable SCA and PCA origins. Stable bilateral PCA branches, with mild to moderate irregularity and stenosis of the right P2 segment but preserved distal flow. Posterior communicating arteries are diminutive or absent. Stable antegrade flow in both ICA siphons. Carotid termini remain patent. MCA and ACA origins are stable and within normal limits. Increased irregularity of the bilateral MCA M1 segments and right ACA A1 segment, might in part be related to mild motion artifact today. Evidence of moderate to severe stenosis now in the left M1 segment. However, both MCA bifurcations remain patent. No M2 branch occlusion is identified, with bilateral M2 branch irregularity re- identified. The left ACA appears stable. The right ACA A1 segment is mildly irregular, an the right A2 segment is occluded in the pericallosal artery region. See series 4, images 118-123). IMPRESSION: 1. Right ACA distal A2 segment occlusion with acute posterior right ACA infarct, involving some of  the medial motor strip. Mild cytotoxic edema without hemorrhage or mass effect. 2. Increased left worse than right MCA M1 segment irregularity may reflect new thromboembolic disease. No major MCA branch occlusion. 3. Punctate acute infarct in the medial left thalamus with no mass effect or hemorrhage. The posterior circulation appears stable since March. 4. Scattered  small areas of susceptibility appear related to small very low density foci in the subarachnoid space on this CT earlier today. Favor trace pneumocephalus, presumably arising from the recent lumbar surgery. Electronically Signed: By: Odessa FlemingH Hall M.D. On: 01/18/2015 07:39   Mr Brain Wo Contrast  01/18/2015 ADDENDUM REPORT: 01/18/2015 07:56 ADDENDUM: Study discussed by telephone with Dr. Thana FarrLeslie Reynolds on 01/18/2015 at 0744 hours. Electronically Signed By: Odessa FlemingH Hall M.D. On: 01/18/2015 07:56   01/18/2015 CLINICAL DATA: 81 year old female status post recent lumbar surgery with acute left side weakness and facial droop. Initial encounter. EXAM: MRI HEAD WITHOUT CONTRAST MRA HEAD WITHOUT CONTRAST TECHNIQUE: Multiplanar, multiecho pulse sequences of the brain and surrounding structures were obtained without intravenous contrast. Angiographic images of the head were obtained using MRA technique without contrast. COMPARISON: Head CT without contrast 0418 hours today. Brain MRI and intracranial MRA 09/06/2014. FINDINGS: MRI HEAD FINDINGS Confluent 2.5 cm area of restricted diffusion in the posterior right ACA territory, with patchy involvement of the medial motor strip. Patchy right cingulate gyrus involvement. Subtle associated T2 and FLAIR hyperintensity. No definite associated hemorrhage. No mass effect. Superimposed punctate restricted diffusion along the medial left thalamus (series 7, image 14). No other left hemisphere restricted diffusion identified. No posterior fossa restricted diffusion identified. Major intracranial vascular flow voids are stable at the skullbase. Loss of the distal right ACA flow void with associated increased FLAIR signal, see MRA findings below. Scattered round areas of T2* susceptibility appears related to the subarachnoid space, and corresponds to several small foci of very low density (negative 200-300 Hounsfield units) on the head CT ear earlier today. Stable gray and  white matter signal elsewhere. No ventriculomegaly. No midline shift. Basilar cisterns are patent. No extra-axial collection. No intracranial mass lesion. Pituitary and cervicomedullary junction appear stable. Normal bone marrow signal. Internal auditory structures, paranasal sinuses, mastoids, orbits, and scalp soft tissues are stable. MRA HEAD FINDINGS Stable antegrade flow in the distal vertebral arteries. No distal vertebral or basilar artery stenosis. PICA origins remain patent. Stable SCA and PCA origins. Stable bilateral PCA branches, with mild to moderate irregularity and stenosis of the right P2 segment but preserved distal flow. Posterior communicating arteries are diminutive or absent. Stable antegrade flow in both ICA siphons. Carotid termini remain patent. MCA and ACA origins are stable and within normal limits. Increased irregularity of the bilateral MCA M1 segments and right ACA A1 segment, might in part be related to mild motion artifact today. Evidence of moderate to severe stenosis now in the left M1 segment. However, both MCA bifurcations remain patent. No M2 branch occlusion is identified, with bilateral M2 branch irregularity re- identified. The left ACA appears stable. The right ACA A1 segment is mildly irregular, an the right A2 segment is occluded in the pericallosal artery region. See series 4, images 118-123). IMPRESSION: 1. Right ACA distal A2 segment occlusion with acute posterior right ACA infarct, involving some of the medial motor strip. Mild cytotoxic edema without hemorrhage or mass effect. 2. Increased left worse than right MCA M1 segment irregularity may reflect new thromboembolic disease. No major MCA branch occlusion. 3. Punctate acute infarct in the medial left thalamus with no  mass effect or hemorrhage. The posterior circulation appears stable since March. 4. Scattered small areas of susceptibility appear related to small very low density foci in the subarachnoid space  on this CT earlier today. Favor trace pneumocephalus, presumably arising from the recent lumbar surgery. Electronically Signed: By: Odessa Fleming M.D. On: 01/18/2015 07:39     Assessment/Plan: Diagnosis: s/p lumbar decompression with post-op right ACA, left thalamic infarcts 1. Does the need for close, 24 hr/day medical supervision in concert with the patient's rehab needs make it unreasonable for this patient to be served in a less intensive setting? Yes 2. Co-Morbidities requiring supervision/potential complications: htn, RBBB 3. Due to bladder management, bowel management, safety, skin/wound care, disease management, medication administration, pain management and patient education, does the patient require 24 hr/day rehab nursing? Yes 4. Does the patient require coordinated care of a physician, rehab nurse, PT (1-2 hrs/day, 5 days/week), OT (1-2 hrs/day, 5 days/week) and SLP (1-2 hrs/day, 5 days/week) to address physical and functional deficits in the context of the above medical diagnosis(es)? Yes Addressing deficits in the following areas: balance, endurance, locomotion, strength, transferring, bowel/bladder control, bathing, dressing, feeding, grooming, toileting, cognition, language, swallowing and psychosocial support 5. Can the patient actively participate in an intensive therapy program of at least 3 hrs of therapy per day at least 5 days per week? Yes 6. The potential for patient to make measurable gains while on inpatient rehab is good 7. Anticipated functional outcomes upon discharge from inpatient rehab are min assist with PT, supervision and min assist with OT, supervision with SLP. 8. Estimated rehab length of stay to reach the above functional goals is: 18-24 days 9. Does the patient have adequate social supports and living environment to accommodate these discharge functional goals? Yes 10. Anticipated D/C setting: Home 11. Anticipated post D/C treatments: HH therapy and Outpatient  therapy 12. Overall Rehab/Functional Prognosis: excellent  RECOMMENDATIONS: This patient's condition is appropriate for continued rehabilitative care in the following setting: CIR Patient has agreed to participate in recommended program. Yes Note that insurance prior authorization may be required for reimbursement for recommended care.  Comment: Rehab Admissions Coordinator to follow up.  Thanks,  Ranelle Oyster, MD, [facility]     01/19/2015       Revision History     Date/Time User Sireen Halk Type Action   01/20/2015 9:43 AM Ranelle Oyster, MD Physician Sign   01/20/2015 6:29 AM Charlton Amor, PA-C Physician Assistant Pend   View Details Report       Routing History     Date/Time From To Method   01/20/2015 9:43 AM Ranelle Oyster, MD Ranelle Oyster, MD In Basket   01/20/2015 9:43 AM Ranelle Oyster, MD Marden Noble, MD Fax

## 2015-01-20 NOTE — Care Management Important Message (Signed)
Important Message  Patient Details  Name: Angela Keith MRN: [id number] Date of Birth: 1930/02/24   Medicare Important Message Given:  Yes-second notification given    [name] 01/20/2015, 12:02 PM

## 2015-01-20 NOTE — Evaluation (Signed)
Speech Language Pathology Evaluation Patient Details Name: Angela Keith MRN: [id number] DOB: 1929/09/10 Today's Date: 79/15/2016 Time: 9528-4132 SLP Time Calculation (min) (ACUTE ONLY): 25 min  Problem List:  Patient Active Problem List   Diagnosis Date Noted  . Cryptogenic stroke   . H/O TIA (transient ischemic attack) and stroke   . Spinal stenosis, lumbar region, with neurogenic claudication 01/17/2015  . Lumbar stenosis 01/17/2015  . 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 Essential hypertension 11/10/2014  . HLD (hyperlipidemia) 11/10/2014  . Back pain with left-sided radiculopathy 11/08/2014  . Cerebral infarction due to thrombosis of cerebral artery   . Hyperlipidemia LDL goal <70 09/07/2014  . Presumed CVA (cerebral infarction) s/p IV tPA, MRI negative 09/05/2014  . Hypertensive urgency 09/05/2014  . Right bundle branch block 03/21/2014  . Cough 10/31/2012  . Nausea vomiting and diarrhea 10/30/2012  . Elevated lipase 10/30/2012  . HTN (hypertension) 10/30/2012  . Unspecified hypothyroidism 10/30/2012   Past Medical History:  Past Medical History  Diagnosis Date  . Hypothyroidism   . Arthritis   . GERD (gastroesophageal reflux disease)   . Dysphagia   . Hyperlipidemia   . Right bundle branch block 03/21/2014  . HTN (hypertension) 10/30/2012  . Unspecified hypothyroidism 10/30/2012  . Severe hearing loss   . DJD (degenerative joint disease), lumbar   . Rotoscoliosis   . Glaucoma   . DJD (degenerative joint disease), cervical   . Chronic neck pain   . Nocturia   . Insomnia   . Stroke   . Shortness of breath dyspnea    Past Surgical History:  Past Surgical History  Procedure Laterality Date  . Breast surgery    . Back surgery      lower back   . Esophagogastroduodenoscopy (egd) with esophageal dilation  06/08/2012    Procedure: ESOPHAGOGASTRODUODENOSCOPY (EGD) WITH ESOPHAGEAL DILATION;  Surgeon: Charolett Bumpers, MD;  Location: [facility] ENDOSCOPY;   Service: Endoscopy;  Laterality: N/A;  . Bladder laser surgery    . Left carpal tunnel surgery    . Facail surgery  2000    facelift and eyelid lift  . Cataract surgery    . Lumbar laminectomy/decompression microdiscectomy N/A 01/17/2015    Procedure: Laminectomy and Foraminotomy - L2-L3 - L3-L4;  Surgeon: [name], MD;  Location: [facility] NEURO ORS;  Service: Neurosurgery;  Laterality: N/A;  Laminectomy and Foraminotomy - L2-L3 - L3-L4  . Ep implantable device N/A 01/19/2015    Procedure: Loop Recorder Insertion;  Surgeon: Marinus Maw, MD;  Location: [facility] INVASIVE CV LAB;  Service: Cardiovascular;  Laterality: N/A;  . Tee without cardioversion N/A 01/19/2015    Procedure: TRANSESOPHAGEAL ECHOCARDIOGRAM (TEE);  Surgeon: Chrystie Nose, MD;  Location: [facility] ENDOSCOPY;  Service: Cardiovascular;  Laterality: N/A;   HPI:  Angela Keith is a 81 y.o. female with PMH significant for HTN, HLD, presumed CVA in past which is s/p TPA but with negative MRI.  Patient underwent uncomplicated lumbar decompressive laminectomy with Dr. Jordan Likes yesterday.  After going home in the afternoon she developed intermittent episodes of L sided weakness and facial droop.  Weakness involves both upper and lower extremities.  Pt had developed increased tone, decorticate posturing, and facial droop, all of this on the left side per MD note.  MRI revealed punctate infarct left thalamus and right ACA infarct impacting motor strip. PMH + for esophageal dilatations (2013 and 2010).   Pt CXR showed shallow inspiration, nothing acute.    Assessment / Plan /  Recommendation Clinical Impression  Pt presents with mild dysarthria and suspected cog-ling deficits characterized by significantly delayed responses/slow processing of information.    Evaluation was initially very difficult as pt did not alert SLP to pt need for glasses/hearing aids that were in the room.  After approximately 22 minutes, pt informed SLP she would have her daughter in law  get her hearing aid when she arrives.  Educated pt to staff ability to obtain glasses/hearing aids to maximize pt's participation in her medical care.    Pt with decreased ability to follow two step written commands- followed one of two steps.  She answered 16 written word question with significant delay. She was oriented x3 and to current month.  She did recall procedure yesterday and asked RN/family how it went indicating good awareness.    Recommend skilled SLP follow up to maximize pt's rehab potential to decrease caregiver burden.      SLP Assessment  Patient needs continued Speech Lanaguage Pathology Services    Follow Up Recommendations  Inpatient Rehab    Frequency and Duration min 1 x/week  1 week   Pertinent Vitals/Pain Pain Assessment: No/denies pain   SLP Goals  Progression toward goals: Progressing toward goals Patient/Family Stated Goal: "I'm going to keep trying every day" Potential to Achieve Goals (ACUTE ONLY): Good Potential Considerations (ACUTE ONLY): Co-morbidities  SLP Evaluation Prior Functioning  Cognitive/Linguistic Baseline: Information not available Type of Home: House Available Help at Discharge: Family Vocation: Retired   IT consultantCognition  Arousal/Alertness: Awake/alert Orientation Level: Oriented to person;Oriented to place;Oriented to situation;Disoriented to time Attention: Sustained Sustained Attention: Appears intact Memory: Impaired Memory Impairment:  (did not recall SLP lengthy visit from two days prior, did recall SlP left to obtain straws for pt within 2 minutes) Problem Solving: Impaired Problem Solving Impairment: Functional basic (did not ask SLP to obtain her eye glasses or hearing aid initially, did state need to use call bell if need to use bathroom)    Comprehension  Auditory Comprehension Overall Auditory Comprehension: Impaired Yes/No Questions: Not tested Commands: Impaired One Step Basic Commands: 75-100% accurate Two Step Basic  Commands: 50-74% accurate Interfering Components: Hearing;Processing speed EffectiveTechniques: Increased volume;Repetition;Slowed speech;Extra processing time Visual Recognition/Discrimination Discrimination: Not tested Reading Comprehension Reading Status: Impaired Word level: Within functional limits Sentence Level: Impaired Interfering Components: Visual acuity;Processing time Effective Techniques: Large print;Eye glasses    Expression Expression Primary Mode of Expression: Verbal Verbal Expression Overall Verbal Expression: Impaired Initiation: Impaired Repetition:  (DNT) Naming:  (named 4/4 items without deficits) Pragmatics: No impairment Written Expression Dominant Hand: Right Written Expression: Not tested   Oral / Motor Oral Motor/Sensory Function Overall Oral Motor/Sensory Function: Impaired Labial ROM: Reduced left Lingual ROM: Reduced left Lingual Strength: Reduced Facial ROM: Reduced left Facial Symmetry: Left droop;Left drooping eyelid Velum: Within Functional Limits Motor Speech Overall Motor Speech: Impaired Respiration: Impaired Phonation: Low vocal intensity Resonance: Within functional limits Articulation: Impaired Level of Impairment: Sentence Intelligibility: Intelligible Motor Planning: Not tested Effective Techniques: Over-articulate   GO     Donavan Burnetamara Arly Salminen, MS [facility] SLP [phone]

## 2015-01-20 NOTE — Progress Notes (Signed)
Angela Mage, RN Rehab Admission Keith Signed Physical Medicine and Rehabilitation PMR Pre-admission 01/20/2015 1:39 PM  Related encounter: ED to Hosp-Admission (Discharged) from 01/18/2015 in [facility]    Expand All Collapse All   PMR Admission Keith Pre-Admission Assessment  Patient: Angela Keith is an 81 y.o., female MRN: [id number] DOB: 07/30/1929 Height:   Weight:    Insurance Information HMO: No PPO: PCP: IPA: 80/20: OTHER:  PRIMARY: Medicare A/B Policy#: [id number] Subscriber: Angela Keith CM Name: Phone#: Fax#:  Pre-Cert#: Employer: Retired Benefits: Phone #: Name: Checked in [place]. Date: 05/08/97 Deduct: $1288 Out of Pocket Max: None Life Max: unlimited CIR: 100% SNF: 100 days Outpatient: 80% Co-Pay: 20% Home Health: 100% Co-Pay: none DME: 80% Co-Pay: 20% Providers: patient's choice  SECONDARY: Tricare for Life Policy#: [id number] Subscriber: Angela Keith CM Name: Phone#: Fax#:  Pre-Cert#: Employer: Retired Benefits: Phone #: [phone] Name:  Eff. Date: Deduct: Out of Pocket Max: Life Max:  CIR: SNF:  Outpatient: Co-Pay:  Home Health: Co-Pay:  DME: Co-Pay:   Emergency Contact Information Contact Information    Name Relation Home Work Mobile   Angela Keith [phone]    Angela Keith Relative [phone]  [phone]     Current Medical History  Patient Admitting Diagnosis: s/p lumbar decompression with post-op right ACA, left thalamic infarcts  History of Present Illness: An 81 y.o. right handed female with history of hypertension,  TIA versus CVA March 2016 with MRI negative and patient was placed on dual antiplatelets therapy with carotid Dopplers and echocardiogram unremarkable and recent lumbar L2-3, 3-4 decompressive laminectomy for lumbar stenosis with radiculopathy 01/17/2015 per Dr. Jordan Keith and discharged to home same day. Presented 01/18/2015 a left sided weakness. MRI/MRA of the brain showed right distal ACA segment occlusion with acute posterior right ACA infarct as well as punctate acute infarct medial left thalamus with no mass effect. Patient did not receive TPA. Cardiology consulted with attempts at TEE but was aborted because they were unable to pass the ultrasound probe after several attempts. Underwent loop recorder placement 01/19/2015.. Currently maintained on Plavix for CVA prophylaxis. Physical occupational therapy evaluations completed recommendations of physical medicine rehabilitation consult. Patient does wear a back brace for comfort after recent back surgery. Patient to be admitted for a comprehensive inpatient rehabilitation program.  Total: 8=NIH  Past Medical History  Past Medical History  Diagnosis Date  . Hypothyroidism   . Arthritis   . GERD (gastroesophageal reflux disease)   . Dysphagia   . Hyperlipidemia   . Right bundle branch block 03/21/2014  . HTN (hypertension) 10/30/2012  . Unspecified hypothyroidism 10/30/2012  . Severe hearing loss   . DJD (degenerative joint disease), lumbar   . Rotoscoliosis   . Glaucoma   . DJD (degenerative joint disease), cervical   . Chronic neck pain   . Nocturia   . Insomnia   . Stroke   . Shortness of breath dyspnea     Family History  family history includes CAD in her brother and father; Diabetes in her mother; Lung cancer in her other; Stroke in her father.  Prior Rehab/Hospitalizations:  Has the patient had major surgery during 100 days prior to admission? No. However did have a lumbar  decompression on 01/17/15.  Current Medications   Current facility-administered medications:  . stroke: mapping our early stages of recovery book, , Does not apply, Once, Angela M Gardner, DO . acetaminophen (TYLENOL) tablet 325-650 mg, 325-650 mg, Oral, Q4H PRN,  Angela Maw, MD . baclofen (LIORESAL) tablet 5 mg, 5 mg, Oral, BID, Angela Scales Rinehuls, PA-C, 5 mg at 01/20/15 1227 . clopidogrel (PLAVIX) tablet 75 mg, 75 mg, Oral, Daily, Angela Benton, NP, 75 mg at 01/20/15 1038 . dorzolamide-timolol (COSOPT) 22.3-6.8 MG/ML ophthalmic solution 1 drop, 1 drop, Both Eyes, BID, Angela M Gardner, DO, 1 drop at 01/20/15 1039 . heparin injection 5,000 Units, 5,000 Units, Subcutaneous, 3 times per day, Angela Maw, MD, 5,000 Units at 01/20/15 1357 . HYDROcodone-acetaminophen (NORCO/VICODIN) 5-325 MG per tablet 0.5-2 tablet, 0.5-2 tablet, Oral, Q4H PRN, Angela Bow, DO, 1 tablet at 01/19/15 1835 . latanoprost (XALATAN) 0.005 % ophthalmic solution 1 drop, 1 drop, Both Eyes, QHS, Angela Bow, DO, 1 drop at 01/19/15 2325 . levothyroxine (SYNTHROID, LEVOTHROID) tablet 88 mcg, 88 mcg, Oral, QAC breakfast, Angela Bow, DO, 88 mcg at 01/20/15 0856 . metoprolol tartrate (LOPRESSOR) tablet 25 mg, 25 mg, Oral, BID, Angela M Gardner, DO, 25 mg at 01/20/15 1038 . ondansetron (ZOFRAN) injection 4 mg, 4 mg, Intravenous, Q6H PRN, Angela Maw, MD . pantoprazole (PROTONIX) EC tablet 40 mg, 40 mg, Oral, BID, Angela M Gardner, DO, 40 mg at 01/20/15 1038 . pravastatin (PRAVACHOL) tablet 40 mg, 40 mg, Oral, q1800, Angela Bow, DO, 40 mg at 01/19/15 1835  Patients Current Diet: Diet Heart Room service appropriate?: Yes; Fluid consistency:: Thin  Precautions / Restrictions Precautions Precautions: Fall, Back Precaution Comments: daughter brought in lumbar corset from home, but did not fit, was from surgery in 2004 Restrictions Weight Bearing Restrictions: No   Has the patient had 2 or more  falls or a fall with injury in the past year?Yes. Patient has had 8 falls in the last 3 months. She bumped her head and bruised her shoulder on one of the falls.  Prior Activity Level Limited Community (1-2x/wk): Up until a month ago, went out 3-4 X a week to [place], short distance driving, ran errands.  Home Assistive Devices / Equipment Home Equipment: Environmental consultant - 2 wheels, Wheelchair - manual, Shower seat  Prior Device Use: Indicate devices/aids used by the patient prior to current illness, exacerbation or injury? Walker. Using a RW. Did use a cane in the past.  Prior Functional Level Prior Function Level of Independence: Independent  Self Care: Did the patient need help bathing, dressing, using the toilet or eating? Independent  Indoor Mobility: Did the patient need assistance with walking from room to room (with or without device)? Independent  Stairs: Did the patient need assistance with internal or external stairs (with or without device)? Independent  Functional Cognition: Did the patient need help planning regular tasks such as shopping or remembering to take medications? Independent. Drove short distances on errands. Her daughter did occasionally check her medications at home for accuracy.  Current Functional Level Cognition  Arousal/Alertness: Awake/alert Overall Cognitive Status: Impaired/Different from baseline Difficult to assess due to: Hard of hearing/deaf Current Attention Level: Focused Orientation Level: Oriented to person, Oriented to place, Oriented to situation, Disoriented to time General Comments: Patient extremely hard of hearing and easily distracted throughout session Attention: Sustained Sustained Attention: Appears intact Memory: Impaired Memory Impairment: (did not recall SLP lengthy visit from two days prior, did recall SlP left to obtain straws for pt within 2 minutes) Problem Solving: Impaired Problem Solving Impairment: Functional basic  (did not ask SLP to obtain her eye glasses or hearing aid initially, did state need to use call bell if need to use bathroom)  Extremity Assessment (includes Sensation/Coordination)  Upper Extremity Assessment: [name] deficits/detail [name] Deficits / Details: 3-/5 MMT shoulder flexion; decreased grip strenght, pt able to oppose fingers [name] Coordination: decreased fine motor, decreased gross motor  Lower Extremity Assessment: Generalized weakness, LLE deficits/detail LLE Deficits / Details: flacid LLE no active motion noted LLE Sensation: decreased light touch, decreased proprioception LLE Coordination: decreased fine motor, decreased gross motor    ADLs  Overall ADL's : Needs assistance/impaired Eating/Feeding: NPO Grooming: Moderate assistance, Sitting (supported sitting) Upper Body Bathing: Moderate assistance, Sitting (supported sitting) Lower Body Bathing: Maximal assistance, Sit to/from stand, Adhering to back precautions Upper Body Dressing : Moderate assistance, Sitting Lower Body Dressing: Maximal assistance, Sit to/from stand, Adhering to back precautions Toilet Transfer: Maximal assistance, +2 for physical assistance Toileting- Clothing Manipulation and Hygiene: Sit to/from stand, Moderate assistance General ADL Comments: Pt limited by back pain    Mobility  Overal bed mobility: Needs Assistance Bed Mobility: Rolling Rolling: Max assist Sidelying to sit: Max assist Sit to sidelying: Max assist, +2 for physical assistance General bed mobility comments: Patient able to initiate rolling with Right side but requires assist for positioning and rolling to side lying, maximial assist to power to upright (some pushing with RUE noted) and assist for trunk and LEs to return to sidelying and reposition    Transfers  Overall transfer level: Needs assistance Transfers: Sit to/from Stand, Stand Pivot Transfers Sit to Stand: Max assist, From elevated surface Stand pivot  transfers: Max assist, From elevated surface General transfer comment: stood from elevated bed with blocked left knee and cues for anterior weight shift, pt's right arm over PT shoulder; then returned to sit and stand pivot to chair cues for right UE to reach to opposite arm of chair and stood on right LE to pivot with left LE blocked    Ambulation / Gait / Stairs / Wheelchair Mobility  Ambulation/Gait General Gait Details: pregait activities in standing    Posture / Balance Dynamic Sitting Balance Sitting balance - Comments: mod to max assist sitting balance; worked on upright posture/trunk and head control sitting edge of bed cues for head upright and chest out hold x 10 seconds x 3 w/support behind pelvis w/gait belt Balance Overall balance assessment: Needs assistance Sitting-balance support: Feet supported, Single extremity supported Sitting balance-Leahy Scale: Poor Sitting balance - Comments: mod to max assist sitting balance; worked on upright posture/trunk and head control sitting edge of bed cues for head upright and chest out hold x 10 seconds x 3 w/support behind pelvis w/gait belt Postural control: Posterior lean, Left lateral lean Standing balance support: Single extremity supported Standing balance-Leahy Scale: Zero Standing balance comment: stood for lateral weight shifts in standing by edge of bed w/max assist and left LE braced by PT's knee and cues for head up to look over PT shoulder x about 30 seconds    Special needs/care consideration BiPAP/CPAP No CPM No Continuous Drip IV No Dialysis No  Life Vest No Oxygen Currently has 02 at 2L . Did not use 02 at home. Special Bed No Trach Size No Wound Vac (area) No  Skin Has lumbar surgical incision.  Bowel mgmt: Last BM per patient 01/16/15 Bladder mgmt: Urinary catheter in place for urinary retention. Diabetic mgmt No    Previous Home Environment Living  Arrangements: Alone Available Help at Discharge: Family Type of Home: House Home Layout: Able to live on main level with bedroom/bathroom Home Access: Level entry Bathroom Shower/Tub: Tub/shower unit, Engineer, building services:  Handicapped height  Discharge Living Setting Plans for Discharge Living Setting: Patient's home, Alone, House Type of Home at Discharge: House Discharge Home Layout: Two level, Able to live on main level with bedroom/bathroom Alternate Level Stairs-Number of Steps: Flight Discharge Home Access: Stairs to enter Entrance Stairs-Number of Steps: 1 small step up to deck entry.  Social/Family/Support Systems Patient Roles: Parent (Has son, dtr-in-law, sister, grand daughters. ) Contact Information: Angela HorsemanDena Barnes - daughter in law and BJ Zachery DauerBarnes - son who is the sherriff. Anticipated Caregiver: family Anticipated Caregiver's Contact Information: See emergency contacts Ability/Limitations of Caregiver: [place] may stay with patient. Grandaughters and daughter in law may also assist.  Caregiver Availability: Other (Comment) (Dtr in law says she can get assistance as needed.) Discharge Plan Discussed with Primary Caregiver: Yes Is Caregiver In Agreement with Plan?: Yes Does Caregiver/Family have Issues with Lodging/Transportation while Pt is in Rehab?: No  Goals/Additional Needs Patient/Family Goal for Rehab: PT min assist, OT supervision to min assist, ST supervision goals Expected length of stay: 18-24 days Cultural Considerations: Watches Jay Schlichterharles Stanley on TV each week. Dietary Needs: Heart diet, thin liquids Equipment Needs: TBD Pt/Family Agrees to Admission and willing to participate: Yes Program Orientation Provided & Reviewed with Pt/Caregiver Including Roles & Responsibilities: Yes  Decrease burden of Care through IP rehab admission: N/A  Possible need for SNF placement upon discharge: Yes, if patient does not progress to level where family  feels that they can manage at home.  Patient Condition: This patient's condition remains as documented in the consult dated 01/20/15, in which the Rehabilitation Physician determined and documented that the patient's condition is appropriate for intensive rehabilitative care in an inpatient rehabilitation facility. Will admit to inpatient rehab today.  Preadmission Screen Completed By: Angela MageLogue, Porshe Fleagle M, 01/20/2015 3:16 PM ______________________________________________________________________  Discussed status with Dr. Riley KillSwartz on 01/20/15 at 1516 and received telephone approval for admission today.  Admission Keith: Angela MageLogue, Jamaria Amborn M, time 1516/Date07/15/16          Cosigned by: Ranelle OysterZachary T Swartz, MD at 01/20/2015 3:40 PM  Revision History     Date/Time User Provider Type Action   01/20/2015 3:40 PM Ranelle OysterZachary T Swartz, MD Physician Cosign   01/20/2015 3:17 PM Angela MageEugenia M Alysiah Suppa, RN Rehab Admission Keith Sign

## 2015-01-20 NOTE — Progress Notes (Signed)
Physical Therapy Treatment Patient Details Name: Angela Keith MRN: [id number] DOB: Aug 30, 1929 Today's Date: 01/20/2015    History of Present Illness 81 y.o. female with a past medical history significant for HTN, hyperlipidemia, presents with transient episodes of left sided weakness, left face droop. MRI revealed multiple acute infarcts. Pt underwent uncomplicated lumbar decompressive laminectomy with Dr. Jordan Likes on 7/12.     PT Comments    Patient progressing to OOB this session.  Able to stand on right and accept weight on left.  Did note increased muscle spasms left LE in bed, moving left hand to command with eye contact.  Still with decreased left awareness and mile pushing with right UE in sitting.  Do feel patient is good CIR candidate.  Will follow acutely.  Follow Up Recommendations  CIR;Supervision/Assistance - 24 hour     Equipment Recommendations  Other (comment) (TBA)    Recommendations for Other Services       Precautions / Restrictions Precautions Precautions: Fall;Back Precaution Comments: daughter brought in lumbar corset from home, but did not fit, was from surgery in 2004    Mobility  Bed Mobility Overal bed mobility: Needs Assistance Bed Mobility: Rolling Rolling: Max assist Sidelying to sit: Max assist          Transfers Overall transfer level: Needs assistance   Transfers: Sit to/from Stand;Stand Pivot Transfers Sit to Stand: Max assist;From elevated surface Stand pivot transfers: Max assist;From elevated surface       General transfer comment: stood from elevated bed with blocked left knee and cues for anterior weight shift, pt's right arm over PT shoulder; then returned to sit and stand pivot to chair cues for right UE to reach to opposite arm of chair and stood on right LE to pivot with left LE blocked  Ambulation/Gait             General Gait Details: pregait activities in standing   Stairs            Wheelchair Mobility     Modified Rankin (Stroke Patients Only) Modified Rankin (Stroke Patients Only) Pre-Morbid Rankin Score: No significant disability Modified Rankin: Severe disability     Balance Overall balance assessment: Needs assistance Sitting-balance support: Feet supported;Single extremity supported Sitting balance-Leahy Scale: Poor Sitting balance - Comments: mod to max assist sitting balance; worked on upright posture/trunk and head control sitting edge of bed cues for head upright and chest out hold x 10 seconds x 3 w/support behind pelvis w/gait belt Postural control: Posterior lean;Left lateral lean Standing balance support: Single extremity supported Standing balance-Leahy Scale: Zero Standing balance comment: stood for lateral weight shifts in standing by edge of bed w/max assist and left LE braced by PT's knee and cues for head up to look over PT shoulder x about 30 seconds                    Cognition Arousal/Alertness: Awake/alert Behavior During Therapy: Flat affect Overall Cognitive Status: Impaired/Different from baseline Area of Impairment: Attention;Awareness;Problem solving   Current Attention Level: Focused Memory: Decreased short-term memory     Awareness: Intellectual Problem Solving: Slow processing;Requires verbal cues;Requires tactile cues;Decreased initiation General Comments: Patient extremely hard of hearing and easily distracted throughout session    Exercises General Exercises - Upper Extremity Shoulder Flexion: PROM;5 reps;Left;Supine Elbow Extension: PROM;Left;5 reps;Supine Wrist Extension: PROM;Left;5 reps;Supine General Exercises - Lower Extremity Ankle Circles/Pumps: PROM;5 reps;Left;Supine Heel Slides: PROM;Left;5 reps;Supine    General Comments General comments (skin integrity, edema,  etc.): daughter in room and very attentive/attempting to assist with mobility (she is former Engineer, civil (consulting)nurse)  Have to keep eye contact and speak in low tones for pt to  hear      Pertinent Vitals/Pain Faces Pain Scale: Hurts little more Pain Location: lower back Pain Descriptors / Indicators:  (pulling) Pain Intervention(s): Monitored during session;Repositioned    Home Living                      Prior Function            PT Goals (current goals can now be found in the care plan section) Progress towards PT goals: Progressing toward goals    Frequency  Min 4X/week    PT Plan Current plan remains appropriate;Frequency needs to be updated    Co-evaluation             End of Session Equipment Utilized During Treatment: Gait belt;Oxygen Activity Tolerance: Patient tolerated treatment well Patient left: in chair;with call bell/phone within reach;with family/visitor present     Time: 1150-1222 PT Time Calculation (min) (ACUTE ONLY): 32 min  Charges:  $Therapeutic Activity: 8-22 mins $Neuromuscular Re-education: 8-22 mins                    G Codes:      Keith,Angela 01/20/2015, 2:01 PM  Sheran Lawlessyndi Keith, PT [phone] 01/20/2015

## 2015-01-20 NOTE — Progress Notes (Signed)
Speech Language Pathology Treatment: Dysphagia  Patient Details Name: Norva PavlovRachel E Febres MRN: [id number] DOB: 09-15-1929 Today's Date: 01/20/2015 Time: [id number] SLP Time Calculation (min) (ACUTE ONLY): 27 min  Assessment / Plan / Recommendation Clinical Impression  Pt with good intake at breakfast today and self fed with set up by SLP.  Slow but effective mastication noted, however pt benefits from moderate cues to clear mouth before consuming more po.  Cough x1 noted with solids = which pt attributes to "spicy sausage" - and pt did have endoscopic procedure yesterday.  No further episodes of symptoms of aspiration noted.    Daughter arrived during meal and inquired re: need for chin tuck or possible EGD in the future if stops eating. Advised her to monitor her mother and follow up with GI if intake decreases or pt demonstrates increased difficulties with swallowing. Advised that chin tuck posture did appear warranted at this time as pt is tolerating intake.    SLP to sign off re: pt's swallow function as all education completed with pt/family. Will continue to follow for cognitive linguistic skills.    HPI Other Pertinent Information: Norva PavlovRachel E Patane is a 81 y.o. female with PMH significant for HTN, HLD, presumed CVA in past which is s/p TPA but with negative MRI.  Patient underwent uncomplicated lumbar decompressive laminectomy with Dr. Jordan LikesPool yesterday.  After going home in the afternoon she developed intermittent episodes of L sided weakness and facial droop.  Weakness involves both upper and lower extremities.  Pt had developed increased tone, decorticate posturing, and facial droop, all of this on the left side per MD note.  MRI revealed punctate infarct left thalamus and right ACA infarct impacting motor strip. PMH + for esophageal dilatations (2013 and 2010).   Pt CXR showed shallow inspiration, nothing acute.    Pertinent Vitals Pain Assessment: No/denies pain  SLP Plan  Continue with current  plan of care    Recommendations Diet recommendations: Regular;Thin liquid Liquids provided via: Straw Medication Administration: Whole meds with puree (follow with liquids) Supervision: Patient able to self feed Compensations: Slow rate;Small sips/bites;Follow solids with liquid Postural Changes and/or Swallow Maneuvers: Seated upright 90 degrees;Upright 30-60 min after meal              Oral Care Recommendations: Oral care BID Follow up Recommendations: Inpatient Rehab Plan: Continue with current plan of care    GO     Mills KollerKimball, Krisandra Bueno Ann Lyly Canizales, MS [facility] SLP [phone]

## 2015-01-20 NOTE — H&P (Signed)
Physical Medicine and Rehabilitation Admission H&P   Chief Complaint  Patient presents with  . Back pain and left sided weakness  : HPI: Angela Keith is a 81 y.o. right handed female with history of hypertension, TIA versus CVA March 2016 with MRI negative and patient was placed on dual antiplatelets therapy with carotid Dopplers and echocardiogram unremarkable and recent lumbar L2-3, 3-4 decompressive laminectomy for lumbar stenosis with radiculopathy 01/17/2015 per Dr. Annette Stable and discharged to home same day. Presented 01/18/2015 a left sided weakness. MRI/MRA of the brain showed right distal ACA segment occlusion with acute posterior right ACA infarct as well as punctate acute infarct medial left thalamus with no mass effect. Patient did not receive TPA. Cardiology consulted with attempts at TEE but was aborted because they were unable to pass the ultrasound probe after several attempts. Underwent loop recorder placement 01/19/2015.. Currently maintained on Plavix for CVA prophylaxis. Physical occupational therapy evaluations completed recommendations of physical medicine rehabilitation consult. Patient does wear a back brace for comfort after recent back surgery. Patient was admitted for a comprehensive rehabilitation program  ROS Review of Systems  Constitutional: Negative for fever and chills.  HENT: Positive for hearing loss.  Eyes: Negative for blurred vision and double vision.  Respiratory:   Shortness of breath with exertion  Cardiovascular: Negative for chest pain and palpitations.  Gastrointestinal: Positive for heartburn and constipation.   GERD  Genitourinary: Negative for urgency.  Musculoskeletal: Positive for back pain and neck pain.  Skin: Negative for rash.  Neurological: Positive for weakness and headaches.  Psychiatric/Behavioral: The patient has insomnia   Past Medical History  Diagnosis Date  . Hypothyroidism   . Arthritis     . GERD (gastroesophageal reflux disease)   . Dysphagia   . Hyperlipidemia   . Right bundle branch block 03/21/2014  . HTN (hypertension) 10/30/2012  . Unspecified hypothyroidism 10/30/2012  . Severe hearing loss   . DJD (degenerative joint disease), lumbar   . Rotoscoliosis   . Glaucoma   . DJD (degenerative joint disease), cervical   . Chronic neck pain   . Nocturia   . Insomnia   . Stroke   . Shortness of breath dyspnea    Past Surgical History  Procedure Laterality Date  . Breast surgery    . Back surgery      lower back   . Esophagogastroduodenoscopy (egd) with esophageal dilation  06/08/2012    Procedure: ESOPHAGOGASTRODUODENOSCOPY (EGD) WITH ESOPHAGEAL DILATION; Surgeon: Garlan Fair, MD; Location: [facility]; Service: Endoscopy; Laterality: N/A;  . Bladder laser surgery    . Left carpal tunnel surgery    . Facail surgery  2000    facelift and eyelid lift  . Cataract surgery    . Lumbar laminectomy/decompression microdiscectomy N/A 01/17/2015    Procedure: Laminectomy and Foraminotomy - L2-L3 - L3-L4; Surgeon: Earnie Larsson, MD; Location: [facility]; Service: Neurosurgery; Laterality: N/A; Laminectomy and Foraminotomy - L2-L3 - L3-L4  . Ep implantable device N/A 01/19/2015    Procedure: Loop Recorder Insertion; Surgeon: Evans Lance, MD; Location: [place] [facility]; Service: Cardiovascular; Laterality: N/A;  . Tee without cardioversion N/A 01/19/2015    Procedure: TRANSESOPHAGEAL ECHOCARDIOGRAM (TEE); Surgeon: Pixie Casino, MD; Location: [facility] ENDOSCOPY; Service: Cardiovascular; Laterality: N/A;   Family History  Problem Relation Age of Onset  . CAD Father   . Stroke Father   . CAD Brother   . Diabetes Mother   . Lung cancer Other  Social History:  reports that she has never smoked. She does not have any smokeless  tobacco history on file. She reports that she does not drink alcohol or use illicit drugs. Allergies:  Allergies  Allergen Reactions  . Ace Inhibitors Other (See Comments)    Severe fatigue  . Lipitor [Atorvastatin] Other (See Comments)    Mood swings  . Motrin [Ibuprofen] Nausea And Vomiting   Medications Prior to Admission  Medication Sig Dispense Refill  . amLODipine (NORVASC) 5 MG tablet Take 5 mg by mouth daily.     . Bilberry 1000 MG CAPS Take 100 mg by mouth daily.    . calcium carbonate (OS-CAL) 600 MG TABS Take 600 mg by mouth daily.    . Coenzyme Q10 (COQ10) 200 MG CAPS Take 200 mg by mouth daily.    . dorzolamide-timolol (COSOPT) 22.3-6.8 MG/ML ophthalmic solution Place 1 drop into both eyes 2 (two) times daily.    . Glucosamine-Chondroitin (MOVE FREE PO) Take 1 tablet by mouth 2 (two) times daily.    Marland Kitchen HYDROcodone-acetaminophen (NORCO/VICODIN) 5-325 MG per tablet Take 1-2 tablets by mouth every 4 (four) hours as needed (mild pain). (Patient taking differently: Take 0.5-2 tablets by mouth every 4 (four) hours as needed (mild pain). ) 30 tablet 0  . latanoprost (XALATAN) 0.005 % ophthalmic solution Place 1 drop into both eyes at bedtime.    Marland Kitchen levothyroxine (SYNTHROID, LEVOTHROID) 88 MCG tablet Take 88 mcg by mouth daily before breakfast.    . losartan-hydrochlorothiazide (HYZAAR) 50-12.5 MG per tablet Take 1 tablet by mouth daily.    . metoprolol tartrate (LOPRESSOR) 25 MG tablet Take 25 mg by mouth 2 (two) times daily.    . Multiple Vitamins-Minerals (ICAPS PO) Take 1 tablet by mouth daily.     . nitroGLYCERIN (NITROSTAT) 0.4 MG SL tablet Place 0.4 mg under the tongue as directed.    . pantoprazole (PROTONIX) 40 MG tablet Take 40 mg by mouth 2 (two) times daily.    . pravastatin (PRAVACHOL) 40 MG tablet Take 1 tablet (40 mg total) by mouth daily at 6 PM. 30 tablet 2  . clopidogrel  (PLAVIX) 75 MG tablet Take 1 tablet (75 mg total) by mouth daily. 30 tablet 2    Home: Home Living Family/patient expects to be discharged to:: Private residence Living Arrangements: Alone Available Help at Discharge: Family Type of Home: House Home Access: Level entry Home Layout: Able to live on main level with bedroom/bathroom Bathroom Shower/Tub: Tub/shower unit, Architectural technologist: Handicapped height Home Equipment: Environmental consultant - 2 wheels, Wheelchair - manual, Careers adviser History: Prior Function Level of Independence: Independent  Functional Status:  Mobility: Bed Mobility Overal bed mobility: Needs Assistance Bed Mobility: Rolling, Sidelying to Sit, Sit to Sidelying Rolling: Mod assist, +2 for physical assistance Sidelying to sit: Max assist Sit to sidelying: Max assist, +2 for physical assistance General bed mobility comments: Patient able to initiate rolling with Right side but requires assist for positioning and rolling to side lying, maximial assist to power to upright (some pushing with RUE noted) and assist for trunk and LEs to return to sidelying and reposition Transfers General transfer comment: unable to perform Ambulation/Gait General Gait Details: unable to perform    ADL: ADL Overall ADL's : Needs assistance/impaired Eating/Feeding: NPO Grooming: Moderate assistance, Sitting (supported sitting) Upper Body Bathing: Moderate assistance, Sitting (supported sitting) Lower Body Bathing: Maximal assistance, Sit to/from stand, Adhering to back precautions Upper Body Dressing : Moderate assistance, Sitting Lower  Body Dressing: Maximal assistance, Sit to/from stand, Adhering to back precautions Toilet Transfer: Maximal assistance, +2 for physical assistance Toileting- Clothing Manipulation and Hygiene: Sit to/from stand, Moderate assistance General ADL Comments: Pt limited by back pain  Cognition: Cognition Overall Cognitive Status:  Difficult to assess Arousal/Alertness: Awake/alert Orientation Level: Oriented to person, Oriented to place, Oriented to situation, Disoriented to time Attention: Sustained Sustained Attention: Appears intact Memory: Impaired Memory Impairment: (did not recall SLP lengthy visit from two days prior, did recall SlP left to obtain straws for pt within 2 minutes) Problem Solving: Impaired Problem Solving Impairment: Functional basic (did not ask SLP to obtain her eye glasses or hearing aid initially, did state need to use call bell if need to use bathroom) Cognition Arousal/Alertness: Awake/alert Behavior During Therapy: Flat affect Overall Cognitive Status: Difficult to assess Area of Impairment: Attention, Problem solving Current Attention Level: Focused Memory: Decreased recall of precautions, Decreased short-term memory Problem Solving: Slow processing, Decreased initiation, Difficulty sequencing, Requires verbal cues, Requires tactile cues General Comments: Patient extremely hard of hearing and easily distracted throughout session Difficult to assess due to: Hard of hearing/deaf  Physical Exam: Blood pressure 118/54, pulse 79, temperature 98.4 F (36.9 C), temperature source Oral, resp. rate 16, SpO2 99 %. Physical Exam Constitutional:  81 year old right-handed female in no distress Eyes: EOM are normal.  Mouth: dentition fair, oral mucosa moist Neck: Normal range of motion. Neck supple. No thyromegaly present.  Cardiovascular: Normal rate and regular rhythm.no murmurs or rubs  Respiratory: Effort normal and breath sounds normal. No respiratory distress. no wheezes GI: Soft. Bowel sounds are normal. She exhibits no distension.  Neurological: She is alert.  Patient did not have her hearing aids in place during exam. She was able to provide her name and place. Follows simple commands despite HOH. Limited insight and awareness. [name] spastic (1-2/4) with underlying 1-2/5 strength  proximal to distal.. LLE: 1-2 hf, ke and 1 ankle. Notable apraxia as well as inattention to left. DTR's 1+ right and left.  Skin: Skin is warm and dry.  Psychiatric: She has a normal mood and affect    Lab Results Last 48 Hours    Results for orders placed or performed during the hospital encounter of 01/18/15 (from the past 48 hour(s))  Hemoglobin A1c Status: Abnormal   Collection Time: 01/19/15 4:52 AM  Result Value Ref Range   Hgb A1c MFr Bld 6.1 (H) 4.8 - 5.6 %    Comment: (NOTE)  Pre-diabetes: 5.7 - 6.4  Diabetes: >6.4  Glycemic control for adults with diabetes: <7.0    Mean Plasma Glucose 128 mg/dL    Comment: (NOTE) Performed At: [facility] [place], [place] [id number] Lindon Romp MD WN:[id number]   Lipid panel Status: None   Collection Time: 01/19/15 4:52 AM  Result Value Ref Range   Cholesterol 122 0 - 200 mg/dL   Triglycerides 20 <150 mg/dL   HDL 52 >40 mg/dL   Total CHOL/HDL Ratio 2.3 RATIO   VLDL 4 0 - 40 mg/dL   LDL Cholesterol 66 0 - 99 mg/dL    Comment:   Total Cholesterol/HDL:CHD Risk Coronary Heart Disease Risk Table  Men Women 1/2 Average Risk 3.4 3.3 Average Risk 5.0 4.4 2 X Average Risk 9.6 7.1 3 X Average Risk 23.4 11.0   Use the calculated Patient Ratio above and the CHD Risk Table to determine the patient's CHD Risk.   ATP III CLASSIFICATION (LDL): <100 mg/dL Optimal 100-129 mg/dL Near  or Above  Optimal 130-159 mg/dL Borderline 160-189 mg/dL High >190 mg/dL Very High   Basic metabolic panel Status: Abnormal   Collection Time: 01/19/15 4:52 AM  Result Value Ref Range   Sodium 133 (L) 135 - 145 mmol/L   Potassium 3.6 3.5 - 5.1 mmol/L   Chloride 98 (L) 101 - 111 mmol/L   CO2 25 22 - 32  mmol/L   Glucose, Bld 121 (H) 65 - 99 mg/dL   BUN 11 6 - 20 mg/dL   Creatinine, Ser 0.82 0.44 - 1.00 mg/dL   Calcium 9.0 8.9 - 10.3 mg/dL   GFR calc non Af Amer >60 >60 mL/min   GFR calc Af Amer >60 >60 mL/min    Comment: (NOTE) The eGFR has been calculated using the CKD EPI equation. This calculation has not been validated in all clinical situations. eGFR's persistently <60 mL/min signify possible Chronic Kidney Disease.    Anion gap 10 5 - 15  Protime-INR Status: None   Collection Time: 01/19/15 4:52 AM  Result Value Ref Range   Prothrombin Time 14.5 11.6 - 15.2 seconds   INR 1.11 0.00 - 1.49  CBC Status: None   Collection Time: 01/19/15 5:05 PM  Result Value Ref Range   WBC 10.3 4.0 - 10.5 K/uL   RBC 4.23 3.87 - 5.11 MIL/uL   Hemoglobin 12.3 12.0 - 15.0 g/dL   HCT 36.6 36.0 - 46.0 %   MCV 86.5 78.0 - 100.0 fL   MCH 29.1 26.0 - 34.0 pg   MCHC 33.6 30.0 - 36.0 g/dL   RDW 13.9 11.5 - 15.5 %   Platelets 224 150 - 400 K/uL  Creatinine, serum Status: None   Collection Time: 01/19/15 5:05 PM  Result Value Ref Range   Creatinine, Ser 0.83 0.44 - 1.00 mg/dL   GFR calc non Af Amer >60 >60 mL/min   GFR calc Af Amer >60 >60 mL/min    Comment: (NOTE) The eGFR has been calculated using the CKD EPI equation. This calculation has not been validated in all clinical situations. eGFR's persistently <60 mL/min signify possible Chronic Kidney Disease.       Imaging Results (Last 48 hours)    No results found.       Medical Problem List and Plan: 1. Functional deficits secondary to status post lumbar decompression 01/17/2015 with postoperative right ACA, left thalamic infarcts. Status post loop recorder -initiate rehab today 2. DVT Prophylaxis/Anticoagulation: Subcutaneous heparin indicated given VTE risk. Monitor platelet counts and any signs of  bleeding -check admission dopplers 3. Pain Management: Baclofen 5 mg twice a day, hydrocodone as needed. -adjust regimen as tolerate and required as she increases activity--close observation to dosing in this 81 yo female 4. Hypertension. Lopressor 25 mg twice a day. Fair control at present. Has had periodic elevation since CVA. 5. Neuropsych: This patient is not yetcapable of making decisions on her own behalf. 6. Skin/Wound Care: Routine skin checks and local care to surgical site. Encourage adequate nutrition 7. Fluids/Electrolytes/Nutrition: follow I&O. check follow-up chemistries on monday 8. Hyperlipidemia. Pravachol     Post Admission Physician Evaluation: 1. Functional deficits secondary to right ACE/left thalamic infarcts after recent lumbar decompression. 2. Patient is admitted to receive collaborative, interdisciplinary care between the physiatrist, rehab nursing staff, and therapy team. 3. Patient's level of medical complexity and substantial therapy needs in context of that medical necessity cannot be provided at a lesser intensity of care such as a SNF. 4. Patient has experienced substantial functional  loss from his/her baseline which was documented above under the "Functional History" and "Functional Status" headings. Judging by the patient's diagnosis, physical exam, and functional history, the patient has potential for functional progress which will result in measurable gains while on inpatient rehab. These gains will be of substantial and practical use upon discharge in facilitating mobility and self-care at the household level. 5. Physiatrist will provide 24 hour management of medical needs as well as oversight of the therapy plan/treatment and provide guidance as appropriate regarding the interaction of the two. 6. 24 hour rehab nursing will assist with bladder management, bowel management, safety, skin/wound care, disease management, medication administration, pain  management and patient education and help integrate therapy concepts, techniques,education, etc. 7. PT will assess and treat for/with: Lower extremity strength, range of motion, stamina, balance, functional mobility, safety, adaptive techniques and equipment, NMR, pain mgt, back precautions, famiy education . Goals are: min assist +. 8. OT will assess and treat for/with: ADL's, functional mobility, safety, upper extremity strength, adaptive techniques and equipment, NMR, pain mgt, back precautions, ego support, family ed. Goals are: min to mod assist. Therapy may not yet proceed with showering this patient. 9. SLP will assess and treat for/with: cognition, communication, language. Goals are: supervision to min assist. 10. Case Management and Social Worker will assess and treat for psychological issues and discharge planning. 11. Team conference will be held weekly to assess progress toward goals and to determine barriers to discharge. 12. Patient will receive at least 3 hours of therapy per day at least 5 days per week. 13. ELOS: 20-25 days  14. Prognosis: good     Meredith Staggers, MD, [place] [facility] 01/20/2015  01/20/2015

## 2015-01-20 NOTE — Progress Notes (Addendum)
Rehab admissions - Evaluated for possible admission.  I met with patient, her sister and her daughter-in-law.  All are in agreement to inpatient rehab admission.  I am waiting to see if we have bed availability later today.  I plan to admit to acute inpatient rehab today or tomorrow pending bed availability.  Call me for questions.  #[phone]  Update:  I have a bed available and will admit to acute inpatient rehab today.  #[phone]

## 2015-01-21 ENCOUNTER — Inpatient Hospital Stay (HOSPITAL_COMMUNITY): Payer: Medicare Other | Admitting: Physical Therapy

## 2015-01-21 ENCOUNTER — Inpatient Hospital Stay (HOSPITAL_COMMUNITY): Payer: Medicare Other | Admitting: Speech Pathology

## 2015-01-21 ENCOUNTER — Inpatient Hospital Stay (HOSPITAL_COMMUNITY): Payer: Medicare Other | Admitting: Occupational Therapy

## 2015-01-21 DIAGNOSIS — I6330 Cerebral infarction due to thrombosis of unspecified cerebral artery: Secondary | ICD-10-CM

## 2015-01-21 NOTE — Evaluation (Signed)
Physical Therapy Assessment and Plan  Patient Details  Name: Angela Keith MRN: [id number] Date of Birth: 04/19/1930  PT Diagnosis: Abnormality of gait, Cognitive deficits, Coordination disorder, Hemiplegia non-dominant, Impaired cognition and Muscle weakness Rehab Potential: Fair ELOS: 25 to 28 days   Today's Date: 01/21/2015 PT Individual Time: 1330-1430 PT Individual Time Calculation (min): 60 min    Problem List:  Patient Active Problem List   Diagnosis Date Noted  . Left hemiparesis   . Cryptogenic stroke   . H/O TIA (transient ischemic attack) and stroke   . Spinal stenosis, lumbar region, with neurogenic claudication 01/17/2015  . Lumbar stenosis 01/17/2015  . 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 Essential hypertension 11/10/2014  . HLD (hyperlipidemia) 11/10/2014  . Back pain with left-sided radiculopathy 11/08/2014  . Cerebral infarction due to thrombosis of cerebral artery   . Hyperlipidemia LDL goal <70 09/07/2014  . Presumed CVA (cerebral infarction) s/p IV tPA, MRI negative 09/05/2014  . Hypertensive urgency 09/05/2014  . Right bundle branch block 03/21/2014  . Cough 10/31/2012  . Nausea vomiting and diarrhea 10/30/2012  . Elevated lipase 10/30/2012  . HTN (hypertension) 10/30/2012  . Unspecified hypothyroidism 10/30/2012    Past Medical History:  Past Medical History  Diagnosis Date  . Hypothyroidism   . Arthritis   . GERD (gastroesophageal reflux disease)   . Dysphagia   . Hyperlipidemia   . Right bundle branch block 03/21/2014  . HTN (hypertension) 10/30/2012  . Unspecified hypothyroidism 10/30/2012  . Severe hearing loss   . DJD (degenerative joint disease), lumbar   . Rotoscoliosis   . Glaucoma   . DJD (degenerative joint disease), cervical   . Chronic neck pain   . Nocturia   . Insomnia   . Stroke   . Shortness of breath dyspnea    Past Surgical History:  Past Surgical History  Procedure Laterality Date  . Breast surgery     . Back surgery      lower back   . Esophagogastroduodenoscopy (egd) with esophageal dilation  06/08/2012    Procedure: ESOPHAGOGASTRODUODENOSCOPY (EGD) WITH ESOPHAGEAL DILATION;  Surgeon: Garlan Fair, MD;  Location: [facility];  Service: Endoscopy;  Laterality: N/A;  . Bladder laser surgery    . Left carpal tunnel surgery    . Facail surgery  2000    facelift and eyelid lift  . Cataract surgery    . Lumbar laminectomy/decompression microdiscectomy N/A 01/17/2015    Procedure: Laminectomy and Foraminotomy - L2-L3 - L3-L4;  Surgeon: Earnie Larsson, MD;  Location: [facility];  Service: Neurosurgery;  Laterality: N/A;  Laminectomy and Foraminotomy - L2-L3 - L3-L4  . Ep implantable device N/A 01/19/2015    Procedure: Loop Recorder Insertion;  Surgeon: Evans Lance, MD;  Location: [facility];  Service: Cardiovascular;  Laterality: N/A;  . Tee without cardioversion N/A 01/19/2015    Procedure: TRANSESOPHAGEAL ECHOCARDIOGRAM (TEE);  Surgeon: Pixie Casino, MD;  Location: [facility];  Service: Cardiovascular;  Laterality: N/A;    Assessment & Plan Clinical Impression: Patient is a 81 y.o. right handed female with history of hypertension, TIA versus CVA March 2016 with MRI negative and patient was placed on dual antiplatelets therapy with carotid Dopplers and echocardiogram unremarkable and recent lumbar L2-3, 3-4 decompressive laminectomy for lumbar stenosis with radiculopathy 01/17/2015 per Dr. Annette Stable and discharged to home same day. Presented 01/18/2015 a left sided weakness. MRI/MRA of the brain showed right distal ACA segment occlusion with acute posterior right ACA  infarct as well as punctate acute infarct medial left thalamus with no mass effect. Patient did not receive TPA. Cardiology consulted with attempts at TEE but was aborted because they were unable to pass the ultrasound probe after several attempts. Underwent loop recorder placement 01/19/2015.. Currently maintained on Plavix  for CVA prophylaxis. Physical occupational therapy evaluations completed recommendations of physical medicine rehabilitation consult. Patient does wear a back brace for comfort after recent back surgery. Patient was admitted for a comprehensive rehabilitation program  Patient transferred to CIR on 01/20/2015 .   Patient currently requires total with mobility secondary to muscle weakness, decreased cardiorespiratoy endurance, abnormal tone, decreased coordination and decreased motor planning and decreased awareness, decreased problem solving, decreased safety awareness and decreased memory.  Prior to hospitalization, patient was modified independent  with mobility and lived with Alone in a House home.  Home access is  Level entry.  Patient will benefit from skilled PT intervention to maximize safe functional mobility, minimize fall risk and decrease caregiver burden for planned discharge home with 24 hour assist.  Anticipate patient will benefit from follow up [facility] at discharge.  PT - End of Session Activity Tolerance: Tolerates 30+ min activity with multiple rests Endurance Deficit: Yes PT Assessment Rehab Potential (ACUTE/IP ONLY): Fair Barriers to Discharge: Decreased caregiver support PT Patient demonstrates impairments in the following area(s): Balance;Endurance;Motor;Safety;Perception PT Transfers Functional Problem(s): Bed Mobility;Bed to Chair;Car PT Locomotion Functional Problem(s): Ambulation;Wheelchair Mobility;Stairs PT Plan PT Intensity: Minimum of 1-2 x/day ,45 to 90 minutes PT Frequency: 5 out of 7 days PT Duration Estimated Length of Stay: 25 to 28 days PT Treatment/Interventions: Ambulation/gait training;Balance/vestibular training;Cognitive remediation/compensation;Disease management/prevention;Discharge planning;Community reintegration;DME/adaptive equipment instruction;Functional mobility training;Patient/family education;Neuromuscular re-education;Splinting/orthotics;Therapeutic  Exercise;Therapeutic Activities;Stair training;UE/LE Strength taining/ROM;UE/LE Coordination activities;Visual/perceptual remediation/compensation;Wheelchair propulsion/positioning PT Transfers Anticipated Outcome(s): min A for transfers PT Locomotion Anticipated Outcome(s): min A for gait, S w/c mobility, mod A for stairs.  PT Recommendation Follow Up Recommendations: Home health PT Patient destination: Home Equipment Recommended: To be determined  Skilled Therapeutic Intervention PT evaluation completed and treatment plan initiated. In gym treatment focused on NMR in parallel bars. Performed multiple stands with max to total A. Pt tolerated standing about 30 seconds each time, while standing focused on midline position in standing, weight shifting and upright posture. Discussed POC with patient, daughter and sister, all questions answered.   PT Evaluation Precautions/Restrictions Precautions Precautions: Fall;Back Precaution Comments: daughter brought in lumbar corset from home for comfort, L inattention Restrictions Weight Bearing Restrictions: No General Chart Reviewed: Yes Family/Caregiver Present: Yes  Pain No c/o pain.  Home Living/Prior Functioning Home Living Available Help at Discharge: Family Type of Home: House Home Access: Level entry Home Layout: Two level;Able to live on main level with bedroom/bathroom Bathroom Shower/Tub: Tub/shower unit;Curtain Bathroom Toilet: Handicapped height Bathroom Accessibility: Yes  Lives With: Alone Prior Function Level of Independence: Independent with transfers;Requires assistive device for independence;Independent with gait  Able to Take Stairs?: Yes Driving: Yes Vocation: Retired Comments:  (daughter lives next door, granddaughters also live nearby, multiple people available to assist at discharge) Vision/Perception As per OT evaluation.  L inattention, does not attend to L visual field, does not attend to L side of the body.     Cognition Overall Cognitive Status: Impaired/Different from baseline Arousal/Alertness: Awake/alert Attention: Sustained Sustained Attention: Appears intact Memory: Impaired Memory Impairment: Retrieval deficit;Decreased recall of new information Problem Solving: Impaired Problem Solving Impairment: Functional basic Safety/Judgment: Impaired Sensation Sensation Light Touch: Impaired Detail Light Touch Impaired Details: Impaired LLE Proprioception: Impaired  Detail Proprioception Impaired Details: Impaired LLE Coordination Gross Motor Movements are Fluid and Coordinated: No Fine Motor Movements are Fluid and Coordinated: No Motor  Motor Motor: Hemiplegia;Abnormal tone;Abnormal postural alignment and control  Mobility Bed Mobility Bed Mobility: Supine to Sit;Sit to Supine Supine to Sit: 1: +1 Total assist;HOB flat Sit to Supine: 1: +1 Total assist;HOB elevated Transfers Transfers: Yes Sit to Stand: 1: +1 Total assist Stand to Sit: 1: +1 Total assist Stand Pivot Transfers: 1: +1 Total assist Locomotion  Ambulation Ambulation: Yes Ambulation/Gait Assistance: 1: +1 Total assist Ambulation Distance (Feet): 3 Feet Assistive device: Other (Comment) (R hand rail) Stairs / Additional Locomotion Stairs: Yes Stairs Assistance: 1: +1 Total assist Number of Stairs: 1 Height of Stairs: 3 Wheelchair Mobility Wheelchair Mobility: Yes Wheelchair Assistance: 3: Mod assist Distance: 75  Trunk/Postural Assessment  Postural Control Postural Control: Deficits on evaluation  Balance Balance Balance Assessed: Yes Static Sitting Balance Static Sitting - Balance Support: Right upper extremity supported;Feet supported Static Sitting - Level of Assistance: 2: Max assist;3: Mod assist;4: Min assist Dynamic Sitting Balance Sitting balance - Comments: Pt able to maintain upright sitting posture with min A for less than 30 seconds with constant verbal cues, tends to lean L and psoteriorly,  tends to overcorrect Static Standing Balance Static Standing - Balance Support: Right upper extremity supported;During functional activity Static Standing - Level of Assistance: 1: +1 Total assist Dynamic Standing Balance Dynamic Standing - Balance Support: Right upper extremity supported;During functional activity Dynamic Standing - Level of Assistance: 1: +1 Total assist Extremity Assessment  B UEs as per OT evaluation.   RLE Assessment RLE Assessment: Within Functional Limits LLE Assessment LLE Assessment: Exceptions to WFL LLE PROM (degrees) Overall PROM Left Lower Extremity: Within functional limits for tasks assessed LLE Strength LLE Overall Strength: Deficits LLE Overall Strength Comments: grossly 0/5 throughout on evaluation, withdraws to pain  FIM:  FIM - Bed/Chair Transfer Bed/Chair Transfer Assistive Devices: Arm rests Bed/Chair Transfer: 1: Supine > Sit: Total A (helper does all/Pt. < 25%);1: Sit > Supine: Total A (helper does all/Pt. < 25%);1: Bed > Chair or W/C: Total A (helper does all/Pt. < 25%);1: Chair or W/C > Bed: Total A (helper does all/Pt. < 25%) FIM - Locomotion: Wheelchair Distance: 75 Locomotion: Wheelchair: 2: Travels 50 - 149 ft with moderate assistance (Pt: 50 - 74%) FIM - Locomotion: Ambulation Locomotion: Ambulation Assistive Devices: Other (comment) (R hand rail) Ambulation/Gait Assistance: 1: +1 Total assist Locomotion: Ambulation: 1: Two helpers FIM - Locomotion: Stairs Locomotion: Scientist, physiological: Insurance account manager - 1 Locomotion: Stairs: 1: Up and Down < 4 stairs with total assistance/helper does all (Pt.<25%)   Refer to Care Plan for Long Term Goals  Recommendations for other services: None  Discharge Criteria: Patient will be discharged from PT if patient refuses treatment 3 consecutive times without medical reason, if treatment goals not met, if there is a change in medical status, if patient makes no progress towards goals or if patient  is discharged from hospital.  The above assessment, treatment plan, treatment alternatives and goals were discussed and mutually agreed upon: by patient and by family  Dub Amis 01/21/2015, 4:22 PM

## 2015-01-21 NOTE — Evaluation (Signed)
Occupational Therapy Assessment and Plan  Patient Details  Name: Angela Keith MRN: [id number] Date of Birth: 07/15/1929  OT Diagnosis: abnormal posture, cognitive deficits, hemiplegia affecting non-dominant side, muscle weakness (generalized) and Left inattention, coordination disorder Rehab Potential: Rehab Potential (ACUTE ONLY): Good ELOS: 25-28 days   Today's Date: 01/21/2015 OT Individual Time: 3832-9191 OT Individual Time Calculation (min): 60 min     Problem List:  Patient Active Problem List   Diagnosis Date Noted  . Left hemiparesis   . Cryptogenic stroke   . H/O TIA (transient ischemic attack) and stroke   . Spinal stenosis, lumbar region, with neurogenic claudication 01/17/2015  . Lumbar stenosis 01/17/2015  . 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 Essential hypertension 11/10/2014  . HLD (hyperlipidemia) 11/10/2014  . Back pain with left-sided radiculopathy 11/08/2014  . Cerebral infarction due to thrombosis of cerebral artery   . Hyperlipidemia LDL goal <70 09/07/2014  . Presumed CVA (cerebral infarction) s/p IV tPA, MRI negative 09/05/2014  . Hypertensive urgency 09/05/2014  . Right bundle branch block 03/21/2014  . Cough 10/31/2012  . Nausea vomiting and diarrhea 10/30/2012  . Elevated lipase 10/30/2012  . HTN (hypertension) 10/30/2012  . Unspecified hypothyroidism 10/30/2012    Past Medical History:  Past Medical History  Diagnosis Date  . Hypothyroidism   . Arthritis   . GERD (gastroesophageal reflux disease)   . Dysphagia   . Hyperlipidemia   . Right bundle branch block 03/21/2014  . HTN (hypertension) 10/30/2012  . Unspecified hypothyroidism 10/30/2012  . Severe hearing loss   . DJD (degenerative joint disease), lumbar   . Rotoscoliosis   . Glaucoma   . DJD (degenerative joint disease), cervical   . Chronic neck pain   . Nocturia   . Insomnia   . Stroke   . Shortness of breath dyspnea    Past Surgical History:  Past Surgical  History  Procedure Laterality Date  . Breast surgery    . Back surgery      lower back   . Esophagogastroduodenoscopy (egd) with esophageal dilation  06/08/2012    Procedure: ESOPHAGOGASTRODUODENOSCOPY (EGD) WITH ESOPHAGEAL DILATION;  Surgeon: Garlan Fair, MD;  Location: [facility];  Service: Endoscopy;  Laterality: N/A;  . Bladder laser surgery    . Left carpal tunnel surgery    . Facail surgery  2000    facelift and eyelid lift  . Cataract surgery    . Lumbar laminectomy/decompression microdiscectomy N/A 01/17/2015    Procedure: Laminectomy and Foraminotomy - L2-L3 - L3-L4;  Surgeon: Earnie Larsson, MD;  Location: [place] [facility];  Service: Neurosurgery;  Laterality: N/A;  Laminectomy and Foraminotomy - L2-L3 - L3-L4  . Ep implantable device N/A 01/19/2015    Procedure: Loop Recorder Insertion;  Surgeon: Evans Lance, MD;  Location: [place] [facility];  Service: Cardiovascular;  Laterality: N/A;  . Tee without cardioversion N/A 01/19/2015    Procedure: TRANSESOPHAGEAL ECHOCARDIOGRAM (TEE);  Surgeon: Pixie Casino, MD;  Location: [facility];  Service: Cardiovascular;  Laterality: N/A;    Assessment & Plan Clinical Impression: Patient is a 81 y.o. year old female with history of hypertension, TIA versus CVA March 2016 with MRI negative and patient was placed on dual antiplatelets therapy with carotid Dopplers and echocardiogram unremarkable and recent lumbar L2-3, 3-4 decompressive laminectomy for lumbar stenosis with radiculopathy 01/17/2015 per Dr. Annette Stable and discharged to home same day. Presented 01/18/2015 a left sided weakness. MRI/MRA of the brain showed right distal ACA segment occlusion  with acute posterior right ACA infarct as well as punctate acute infarct medial left thalamus with no mass effect. Patient did not receive TPA. Cardiology consulted with attempts at TEE but was aborted because they were unable to pass the ultrasound probe after several attempts. Underwent loop recorder  placement 01/19/2015.. Currently maintained on Plavix for CVA prophylaxis. Physical occupational therapy evaluations completed recommendations of physical medicine rehabilitation consult. Patient does wear a back brace for comfort after recent back surgery. Patient was admitted for a comprehensive rehabilitation program .  Patient transferred to CIR on 01/20/2015 .    Patient currently requires total with basic self-care skills secondary to muscle weakness, decreased cardiorespiratoy endurance, R gaze preference, L inattention, decreased initiation, decreased problem solving, decreased safety awareness, decreased memory and delayed processing and decreased sitting balance, decreased standing balance, decreased postural control, hemiplegia and decreased balance strategies.  Prior to hospitalization, patient could complete ADLs and IADLs with modified independent .  Patient will benefit from skilled intervention to decrease level of assist with basic self-care skills prior to discharge home with care partner.  Anticipate patient will require 24 hour supervision and minimal physical assistance and follow up home health.  OT - End of Session Activity Tolerance: Decreased this session Endurance Deficit: Yes Endurance Deficit Description: Pt requiring multiple rest breaks during session secondary to fatigue. OT Assessment Rehab Potential (ACUTE ONLY): Good Barriers to Discharge:  (none known) OT Patient demonstrates impairments in the following area(s): Balance;Vision;Cognition;Endurance;Motor;Sensory;Safety OT Basic ADL's Functional Problem(s): Eating;Grooming;Bathing;Dressing;Toileting OT Advanced ADL's Functional Problem(s):  (n/a) OT Transfers Functional Problem(s): Toilet;Tub/Shower OT Additional Impairment(s): Fuctional Use of Upper Extremity OT Plan OT Intensity: Minimum of 1-2 x/day, 45 to 90 minutes OT Frequency: 5 out of 7 days OT Duration/Estimated Length of Stay: 25-28 days OT  Treatment/Interventions: Balance/vestibular training;Community reintegration;Functional electrical stimulation;Neuromuscular re-education;Patient/family education;Self Care/advanced ADL retraining;Therapeutic Exercise;UE/LE Coordination activities;Wheelchair propulsion/positioning;Visual/perceptual remediation/compensation;UE/LE Strength taining/ROM;Therapeutic Activities;Psychosocial support;Pain management;Functional mobility training;DME/adaptive equipment instruction;Discharge planning;Cognitive remediation/compensation OT Self Feeding Anticipated Outcome(s): supervision OT Basic Self-Care Anticipated Outcome(s): supervision - min A OT Toileting Anticipated Outcome(s): Min A OT Bathroom Transfers Anticipated Outcome(s): Min A OT Recommendation Recommendations for Other Services: Other (comment) (none) Patient destination: Home Follow Up Recommendations: Home health OT;24 hour supervision/assistance;Skilled nursing facility Equipment Recommended: To be determined   Skilled Therapeutic Intervention Upon entering the room, pt seated on BSC with NT present in room and pt having BM. Therapist assisting pt in donning back corset from home while in seated position.  Pt able to perform hygiene but required assist from therapist for thoroughness. LB clothing donned while seated on BSC. Pt requiring max A for standing balance as second helper assisted with LB clothing management over B hips. Stand pivot transfer from [facility] >wheelchair with +2 assist and pt pushing with R UE. Pt seated in wheelchair at sink side in order to complete grooming and UB bathing and dressing. Pt requiring hand over hand assistance to incorporate L UE into functional tasks. R gaze preference present during session requiring mod verbal cues to have pt look to L during session. OT educated pt on OT purpose, POC, and goals with pt verbalizing understanding. Pt then transferred back to bed for RN to utilize bladder scanner. Pt transferring  back into bed with +2 assistance squat pivot transfer and +2 assist for sit >supine with 1 helper assisting with trunk and second helper assisting with B LEs. Pt supine in bed with NT present as therapist exiting the room.   OT Evaluation Precautions/Restrictions  Precautions Precautions:  Fall;Back Precaution Comments: daughter brought in lumbar corset from home for comfort Restrictions Weight Bearing Restrictions: No Pain Pain Assessment Pain Assessment: No/denies pain Home Living/Prior Functioning Home Living Available Help at Discharge: Family Type of Home: House Home Access: Level entry Home Layout: Able to live on main level with bedroom/bathroom Bathroom Shower/Tub: Tub/shower unit, Architectural technologist: Handicapped height Bathroom Accessibility: Yes  Lives With: Alone Prior Function Level of Independence: Requires assistive device for independence  Able to Take Stairs?: Yes Driving: Yes Vocation: Retired Radiographer, therapeutic- History Baseline Vision/History: Wears glasses Wears Glasses: Reading only Patient Visual Report: No change from baseline Vision- Assessment Vision Assessment?: Yes Alignment/Gaze Preference: Gaze right Tracking/Visual Pursuits: Requires cues, head turns, or add eye shifts to track;Decreased smoothness of horizontal tracking;Decreased smoothness of vertical tracking Visual Fields: Impaired-to be further tested in functional context  Cognition Overall Cognitive Status: Impaired/Different from baseline Arousal/Alertness: Awake/alert Orientation Level: Person;Place Year: 2016 Month: July Day of Week: Incorrect Memory: Impaired Memory Impairment: Decreased recall of new information;Retrieval deficit Immediate Memory Recall: Sock;Blue;Bed Memory Recall:  (none recalled) Attention: Sustained Sustained Attention: Appears intact Problem Solving: Impaired Problem Solving Impairment: Functional basic Safety/Judgment:  Impaired Sensation Sensation Light Touch: Impaired Detail Light Touch Impaired Details: Impaired LLE;Impaired [name] Stereognosis: Not tested Hot/Cold: Not tested Proprioception: Impaired Detail Proprioception Impaired Details: Impaired LLE;Impaired [name] Coordination Gross Motor Movements are Fluid and Coordinated: No Fine Motor Movements are Fluid and Coordinated: No Motor  Motor Motor: Hemiplegia;Abnormal tone;Abnormal postural alignment and control Mobility  Bed Mobility Bed Mobility: Supine to Sit;Sit to Supine Supine to Sit: 1: +1 Total assist;HOB flat Sit to Supine: 1: +1 Total assist;HOB elevated Transfers Sit to Stand: 1: +1 Total assist;1: +2 Total assist Sit to Stand: Patient Percentage: 20% Stand to Sit: 1: +1 Total assist  Trunk/Postural Assessment  Cervical Assessment Cervical Assessment: Exceptions to [facility] (head turned to R) Thoracic Assessment Thoracic Assessment: Exceptions to [facility] (kyphotic) Lumbar Assessment Lumbar Assessment: Exceptions to [facility] (posterior pelvic tilt and L lateral lean in sitting and standing) Postural Control Postural Control: Deficits on evaluation  Balance Balance Balance Assessed: Yes Static Sitting Balance Static Sitting - Balance Support: Right upper extremity supported;Feet supported Static Sitting - Level of Assistance: 2: Max assist;3: Mod assist;4: Min assist Dynamic Sitting Balance Dynamic Sitting - Balance Support: Right upper extremity supported;Left upper extremity supported;During functional activity Dynamic Sitting - Level of Assistance: 1: +1 Total assist;2: Max assist Dynamic Sitting - Balance Activities: Forward lean/weight shifting;Reaching for objects Sitting balance - Comments: Pt able to maintain upright sitting posture with min A for less than 30 seconds with constant verbal cues, tends to lean L and psoteriorly, tends to overcorrect Static Standing Balance Static Standing - Balance Support: Right upper extremity  supported;During functional activity Static Standing - Level of Assistance: 1: +1 Total assist Dynamic Standing Balance Dynamic Standing - Balance Support: Right upper extremity supported;During functional activity Dynamic Standing - Level of Assistance: 1: +1 Total assist Extremity/Trunk Assessment RUE Assessment RUE Assessment: Within Functional Limits [name] Assessment [name] Assessment: Exceptions to WFL [name] AROM (degrees) Overall AROM Left Upper Extremity: Deficits (AROM in L digits flexion/ext, slight wrist ext/flex, trace elbow) [name] PROM (degrees) Overall PROM Left Upper Extremity: Within functional limits for tasks assessed [name] Strength [name] Overall Strength: Deficits [name] Tone [name] Tone: Mild;Hypertonic  FIM:  FIM - Grooming Grooming Steps: Brush, comb hair;Wash, rinse, dry face Grooming: 3: Patient completes 2 of 4 or 3 of 5 steps FIM - Bathing Bathing Steps Patient  Completed: Left Arm;Abdomen;Right upper leg;Left upper leg;Chest Bathing: 3: Mod-Patient completes 5-7 [id number] parts or 50-74% FIM - Upper Body Dressing/Undressing Upper body dressing/undressing steps patient completed: Thread/unthread right sleeve of pullover shirt/dresss Upper body dressing/undressing: 2: Max-Patient completed 25-49% of tasks FIM - Lower Body Dressing/Undressing Lower body dressing/undressing: 1: Two helpers FIM - Toileting Toileting: 1: Two helpers FIM - BControl and instrumentation engineerDevices: Arm rests Bed/Chair Transfer: 1: Two helpers FIM - TRadio producerDevices: BRecruitment consultantTransfers: 1-Two helpers   Refer to Care Plan for Long Term Goals  Recommendations for other services: None  Discharge Criteria: Patient will be discharged from OT if patient refuses treatment 3 consecutive times without medical reason, if treatment goals not met, if there is a change in medical status, if patient makes no progress towards goals or if patient is  discharged from hospital.  The above assessment, treatment plan, treatment alternatives and goals were discussed and mutually agreed upon: by patient  PPhineas Semen7/16/2016, 10:12 AM

## 2015-01-21 NOTE — Progress Notes (Signed)
Speech Language Pathology Daily Session Note  Patient Details  Name: Angela Keith MRN: [id number] Date of Birth: Dec 03, 1929  Today's Date: 01/21/2015 SLP Individual Time: 1300-1330 SLP Individual Time Calculation (min): 30 min  Short Term Goals: Week 1: SLP Short Term Goal 1 (Week 1): Patient will utilize external aids for orientation to time with Mod mutlimodal cues SLP Short Term Goal 2 (Week 1): Patient will attend to left during familiar structured tasks with Mod multimodal cues  SLP Short Term Goal 3 (Week 1): Patient will select attention to basic, familar tasks for 10 minutes with Mod multimodal cues  SLP Short Term Goal 4 (Week 1): Patient will identify 2 physical and 2 cognitive deficits with Mod question cues   Skilled Therapeutic Interventions: Skilled treatment session focused on addressing cognitive goals. SLP facilitated session by providing Max multimodal cues for problem solving lunch tray set-up due to impaired left attention and questionable slowed processing versus initiation.  Patient was able to select attention to task for ~5 minutes with Mod cues; however patient began conversing with SLP and was unable to alternate attention without Total assist.  Continue with current plan of care.   FIM:  Comprehension Comprehension Mode: Auditory Comprehension: 5-Set-up assist with hearing device Expression Expression Mode: Verbal Expression: 5-Expresses basic 90% of the time/requires cueing < 10% of the time. Social Interaction Social Interaction: 4-Interacts appropriately 75 - 89% of the time - Needs redirection for appropriate language or to initiate interaction. Memory Memory: 2-Recognizes or recalls 25 - 49% of the time/requires cueing 51 - 75% of the time FIM - Eating Eating Activity: 5: Set-up assist for open containers;5: Set-up assist for cut food  Pain Pain Assessment Pain Assessment: No/denies pain Pain Score: 3   Therapy/Group: Individual  Therapy  Angela FerrettiMelissa Noha Keith, M.A., CCC-SLP [phone]  Angela Keith 01/21/2015, 6:35 PM

## 2015-01-21 NOTE — Progress Notes (Signed)
Pt had no void in 8 hours, since removal of foley at 1830. Bladder scan volume showed 463ml, in and out catherized for 425ml at 0315. Angela Keith, Phill MutterMelissa Rebecca

## 2015-01-21 NOTE — Evaluation (Addendum)
Speech Language Pathology Assessment and Plan  Patient Details  Name: Angela Keith MRN: [id number] Date of Birth: 06/15/1930  SLP Diagnosis: Cognitive Impairments;Dysarthria  Rehab Potential: Good ELOS: 25-28 days    Today's Date: 01/21/2015 SLP Individual Time: 3790-2409 SLP Individual Time Calculation (min): 55 min   Problem List:  Patient Active Problem List   Diagnosis Date Noted  . Left hemiparesis   . Cryptogenic stroke   . H/O TIA (transient ischemic attack) and stroke   . Spinal stenosis, lumbar region, with neurogenic claudication 01/17/2015  . Lumbar stenosis 01/17/2015  . 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 Essential hypertension 11/10/2014  . HLD (hyperlipidemia) 11/10/2014  . Back pain with left-sided radiculopathy 11/08/2014  . Cerebral infarction due to thrombosis of cerebral artery   . Hyperlipidemia LDL goal <70 09/07/2014  . Presumed CVA (cerebral infarction) s/p IV tPA, MRI negative 09/05/2014  . Hypertensive urgency 09/05/2014  . Right bundle branch block 03/21/2014  . Cough 10/31/2012  . Nausea vomiting and diarrhea 10/30/2012  . Elevated lipase 10/30/2012  . HTN (hypertension) 10/30/2012  . Unspecified hypothyroidism 10/30/2012   Past Medical History:  Past Medical History  Diagnosis Date  . Hypothyroidism   . Arthritis   . GERD (gastroesophageal reflux disease)   . Dysphagia   . Hyperlipidemia   . Right bundle branch block 03/21/2014  . HTN (hypertension) 10/30/2012  . Unspecified hypothyroidism 10/30/2012  . Severe hearing loss   . DJD (degenerative joint disease), lumbar   . Rotoscoliosis   . Glaucoma   . DJD (degenerative joint disease), cervical   . Chronic neck pain   . Nocturia   . Insomnia   . Stroke   . Shortness of breath dyspnea    Past Surgical History:  Past Surgical History  Procedure Laterality Date  . Breast surgery    . Back surgery      lower back   . Esophagogastroduodenoscopy (egd) with esophageal  dilation  06/08/2012    Procedure: ESOPHAGOGASTRODUODENOSCOPY (EGD) WITH ESOPHAGEAL DILATION;  Surgeon: Garlan Fair, MD;  Location: [facility];  Service: Endoscopy;  Laterality: N/A;  . Bladder laser surgery    . Left carpal tunnel surgery    . Facail surgery  2000    facelift and eyelid lift  . Cataract surgery    . Lumbar laminectomy/decompression microdiscectomy N/A 01/17/2015    Procedure: Laminectomy and Foraminotomy - L2-L3 - L3-L4;  Surgeon: Earnie Larsson, MD;  Location: [place] [facility];  Service: Neurosurgery;  Laterality: N/A;  Laminectomy and Foraminotomy - L2-L3 - L3-L4  . Ep implantable device N/A 01/19/2015    Procedure: Loop Recorder Insertion;  Surgeon: Evans Lance, MD;  Location: [place] [facility];  Service: Cardiovascular;  Laterality: N/A;  . Tee without cardioversion N/A 01/19/2015    Procedure: TRANSESOPHAGEAL ECHOCARDIOGRAM (TEE);  Surgeon: Pixie Casino, MD;  Location: [facility]COPY;  Service: Cardiovascular;  Laterality: N/A;    Assessment / Plan / Recommendation Clinical Impression Angela Keith is an 81 y.o. right handed female with history of hypertension, esophageal dilatations (2013 and 2010), TIA versus CVA March 2016 with MRI negative and patient was placed on dual antiplatelets therapy with carotid Dopplers and echocardiogram unremarkable and recent lumbar L2-3, 3-4 decompressive laminectomy for lumbar stenosis with radiculopathy 01/17/2015 per Dr. Annette Stable and discharged to home same day. Presented 01/18/2015 a left sided weakness. MRI/MRA of the brain showed right distal ACA segment occlusion with acute posterior right ACA infarct as well as  punctate acute infarct medial left thalamus with no mass effect. Patient does wear a back brace for comfort after recent back surgery. Physical occupational therapy evaluations completed recommendations of physical medicine rehabilitation consult.  Patient was admitted for a comprehensive rehabilitation program 01/21/15.   Cognitive-linguistic and Bedside Swallow evaluations completed.  Patient presents with mild dysarthria and moderately-severe cognitive-linguistic deficits characterized by slow processing, impaired recall of new/daily information, intellectual awareness, selective attention and basic problem solving which impact the patient's overall safety with self-care tasks. Patient also demonstrates left in attention and prolonged mastication with current diet. Given that speech is intelligible and patient requires extra time with solids these goals will not be addressed at this time.  Patient would benefit from skilled SLP intervention with focus on cognitive impairments in order to maximize her functional independence prior to discharge. Anticipate patient will require 24 hour supervision at home and follow up SLP services.    Skilled Therapeutic Interventions          Cognitive-linguistic evaluation completed with repetitions and patient required Max multimodal cues to utilize external aids and attend to left of environment to complete gives tasks.       SLP Assessment  Patient will need skilled Speech Lanaguage Pathology Services during CIR admission    Recommendations  SLP Diet Recommendations: Age appropriate regular solids;Thin Liquid Administration via: Cup;Straw Medication Administration: Whole meds with puree Supervision: Patient able to self feed;Intermittent supervision to cue for compensatory strategies Compensations: Slow rate;Small sips/bites Postural Changes and/or Swallow Maneuvers: Seated upright 90 degrees;Upright 30-60 min after meal;Out of bed for meals Oral Care Recommendations: Oral care BID Patient destination:  (TBD) Follow up Recommendations: Home Health SLP;24 hour supervision/assistance;Skilled Nursing facility Equipment Recommended: None recommended by SLP    SLP Frequency 3 to 5 out of 7 days   SLP Treatment/Interventions Cognitive remediation/compensation;Cueing  hierarchy;Environmental controls;Functional tasks;Internal/external aids;Patient/family education    Pain Pain Assessment Pain Assessment: No/denies pain  Prior Functioning Cognitive/Linguistic Baseline: Information not available Type of Home: House  Lives With: Alone Available Help at Discharge: Family Vocation: Retired  Industrial/product designer Term Goals: Week 1: SLP Short Term Goal 1 (Week 1): Patient will utilize external aids for orientation to time with Mod mutlimodal cues SLP Short Term Goal 2 (Week 1): Patient will attend to left during familiar structured tasks with Mod multimodal cues  SLP Short Term Goal 3 (Week 1): Patient will select attention to basic, familar tasks for 10 minutes with Mod multimodal cues  SLP Short Term Goal 4 (Week 1): Patient will identify 2 physical and 2 cognitive deficits with Mod question cues   See FIM for current functional status Refer to Care Plan for Long Term Goals  Recommendations for other services: None  Discharge Criteria: Patient will be discharged from SLP if patient refuses treatment 3 consecutive times without medical reason, if treatment goals not met, if there is a change in medical status, if patient makes no progress towards goals or if patient is discharged from hospital.  The above assessment, treatment plan, treatment alternatives and goals were discussed and mutually agreed upon: No family available/patient unable  Carmelia Roller., [place]  [place] 01/21/2015, 6:14 PM

## 2015-01-21 NOTE — Progress Notes (Signed)
Angela Keith is a 81 y.o. female Oct 02, 1929 [id number]  Subjective: No new complaints. No new problems. Slept well. Feeling OK.  Objective: Vital signs in last 24 hours: Temp:  [98.4 F (36.9 C)-99.1 F (37.3 C)] 98.9 F (37.2 C) (07/16 0605) Pulse Rate:  [73-82] 73 (07/16 0605) Resp:  [16-18] 18 (07/16 0605) BP: (115-147)/(51-94) 137/74 mmHg (07/16 0605) SpO2:  [95 %-99 %] 98 % (07/16 0605) Weight change:  Last BM Date: 01/16/15  Intake/Output from previous day: 07/15 0701 - 07/16 0700 In: 240 [P.O.:240] Out: 425 [Urine:425]  Physical Exam General: No apparent distress   On bed, working on assignment with OT -  Lungs: Normal effort. Lungs clear to auscultation, no crackles or wheezes. Cardiovascular: Regular rate and rhythm, no edema Neurological: No new neurological deficits - significant LHP and L neglect  Lab Results: BMET    Component Value Date/Time   NA 133* 01/19/2015 0452   K 3.6 01/19/2015 0452   CL 98* 01/19/2015 0452   CO2 25 01/19/2015 0452   GLUCOSE 121* 01/19/2015 0452   BUN 11 01/19/2015 0452   CREATININE 1.04* 01/20/2015 1825   CALCIUM 9.0 01/19/2015 0452   GFRNONAA 49* 01/20/2015 1825   GFRAA 56* 01/20/2015 1825   CBC    Component Value Date/Time   WBC 10.2 01/20/2015 1825   RBC 4.30 01/20/2015 1825   HGB 12.1 01/20/2015 1825   HCT 37.2 01/20/2015 1825   PLT 216 01/20/2015 1825   MCV 86.5 01/20/2015 1825   MCH 28.1 01/20/2015 1825   MCHC 32.5 01/20/2015 1825   RDW 13.7 01/20/2015 1825   LYMPHSABS 1.7 01/18/2015 0341   MONOABS 1.4* 01/18/2015 0341   EOSABS 0.0 01/18/2015 0341   BASOSABS 0.0 01/18/2015 0341   CBG's (last 3):  No results for input(s): GLUCAP in the last 72 hours. LFT's Lab Results  Component Value Date   ALT 17 01/18/2015   AST 27 01/18/2015   ALKPHOS 47 01/18/2015   BILITOT 0.7 01/18/2015    Studies/Results: No results found.  Medications:  I have reviewed the patient's current medications. Scheduled  Medications: . baclofen  5 mg Oral BID  . clopidogrel  75 mg Oral Daily  . dorzolamide-timolol  1 drop Both Eyes BID  . heparin  5,000 Units Subcutaneous 3 times per day  . latanoprost  1 drop Both Eyes QHS  . levothyroxine  88 mcg Oral QAC breakfast  . metoprolol tartrate  25 mg Oral BID  . pantoprazole  40 mg Oral BID  . pravastatin  40 mg Oral q1800   PRN Medications: acetaminophen, HYDROcodone-acetaminophen, ondansetron **OR** ondansetron (ZOFRAN) IV, sorbitol  Assessment/Plan: Principal Problem:   Cerebral infarction due to thrombosis of cerebral artery Active Problems:   Essential hypertension   Spinal stenosis, lumbar region, with neurogenic claudication   Left hemiparesis   1.Functional deficits secondary to status post lumbar decompression 01/17/2015 with postoperative right ACA, left thalamic infarcts on 7/13. Status post loop recorder 7/14 -initiate rehab 7/15 2. DVT Prophylaxis/Anticoagulation: Subcutaneous heparin indicated given VTE risk. Monitor platelet counts and any signs of bleeding 3. Pain Management: Baclofen 5 mg twice a day, hydrocodone as needed. -adjust regimen as tolerate and required as she increases activity--close observation to dosing in this 81 yo female 4. Hypertension. Lopressor 25 mg twice a day. Fair control at present. Has had periodic BP elevation since CVA. 5. Neuropsych: This patient is not yetcapable of making decisions on her own behalf. 6. Skin/Wound Care: Routine skin checks  and local care to surgical site. Encourage adequate nutrition 7. Fluids/Electrolytes/Nutrition: follow I&O. check follow-up chemistries on monday 8. Hyperlipidemia. Pravachol  Length of stay, days: 1  Angela Srinivasan A. Felicity Coyer, MD 01/21/2015, 10:36 AM

## 2015-01-22 ENCOUNTER — Inpatient Hospital Stay (HOSPITAL_COMMUNITY): Payer: Medicare Other | Admitting: Physical Therapy

## 2015-01-22 DIAGNOSIS — M4806 Spinal stenosis, lumbar region: Secondary | ICD-10-CM

## 2015-01-22 DIAGNOSIS — I10 Essential (primary) hypertension: Secondary | ICD-10-CM

## 2015-01-22 NOTE — Progress Notes (Signed)
Physical Therapy Session Note  Patient Details  Name: Angela PavlovRachel E Meloni MRN: [id number] Date of Birth: 06/11/1930  Today's Date: 01/22/2015 PT Individual Time: [id number] PT Individual Time Calculation (min): 30 min   Short Term Goals: Week 1:  PT Short Term Goal 1 (Week 1): Pt will transfer supine to edge of bed, edge of bed to supine with max A.  PT Short Term Goal 2 (Week 1): Pt will transfer bed to chair, chair to bed with max A.  PT Short Term Goal 3 (Week 1): Pt will ambulate about 25 feet with LRAD and max A.  PT Short Term Goal 4 (Week 1): Pt will propel w/c about 75 feet with min A.  PT Short Term Goal 5 (Week 1): Pt will ascend/descend 2 stairs with 1 rail and max A.    Skilled Therapeutic Interventions/Progress Updates:   Pt limited by pushing and decreased midlines awareness in all upright activities. Pt does best in standing without UE support to decrease pushing. Pt would continue to benefit from skilled PT services to increase functional mobility.  Therapy Documentation Precautions:  Precautions Precautions: Fall, Back Precaution Comments: daughter brought in lumbar corset from home for comfort, L inattention Restrictions Weight Bearing Restrictions: No Pain: Pain Assessment Pain Assessment: No/denies pain Mobility:  Total assist with cues for weight shift, LE placement, attention, and safety  Other Treatments:  Pt educated on rehab plan, safety in mobility, and rehab progress. In standing Max A x3' static. Pt performs hip add AROM, [name] AAROM in all planes 2x10. Unsupported sitting as active rest.  See FIM for current functional status  Therapy/Group: Individual Therapy  Christia ReadingKinney, Russell Engelstad G 01/22/2015, 12:55 PM

## 2015-01-22 NOTE — Progress Notes (Signed)
Angela PavlovRachel E Keith is a 81 y.o. female 1930/06/01 [id number]  Subjective: No new complaints. No new problems. Slept well. Feeling OK.  Objective: Vital signs in last 24 hours: Temp:  [98.4 F (36.9 C)-98.7 F (37.1 C)] 98.4 F (36.9 C) (07/17 0544) Pulse Rate:  [44-81] 69 (07/17 0544) Resp:  [18] 18 (07/17 0544) BP: (116-167)/(67-89) 116/75 mmHg (07/17 0544) SpO2:  [93 %-97 %] 93 % (07/17 0544) Weight change:  Last BM Date: 01/21/15  Intake/Output from previous day: 07/16 0701 - 07/17 0700 In: 120 [P.O.:120] Out: 1800 [Urine:1800]  Physical Exam General: No apparent distress   Eating bkfst, HOH Lungs: Normal effort. Lungs clear to auscultation, no crackles or wheezes. Cardiovascular: Regular rate and rhythm, no edema Neurological: No new neurological deficits: L HP and neglect  Lab Results: BMET    Component Value Date/Time   NA 133* 01/19/2015 0452   K 3.6 01/19/2015 0452   CL 98* 01/19/2015 0452   CO2 25 01/19/2015 0452   GLUCOSE 121* 01/19/2015 0452   BUN 11 01/19/2015 0452   CREATININE 1.04* 01/20/2015 1825   CALCIUM 9.0 01/19/2015 0452   GFRNONAA 49* 01/20/2015 1825   GFRAA 56* 01/20/2015 1825   CBC    Component Value Date/Time   WBC 10.2 01/20/2015 1825   RBC 4.30 01/20/2015 1825   HGB 12.1 01/20/2015 1825   HCT 37.2 01/20/2015 1825   PLT 216 01/20/2015 1825   MCV 86.5 01/20/2015 1825   MCH 28.1 01/20/2015 1825   MCHC 32.5 01/20/2015 1825   RDW 13.7 01/20/2015 1825   LYMPHSABS 1.7 01/18/2015 0341   MONOABS 1.4* 01/18/2015 0341   EOSABS 0.0 01/18/2015 0341   BASOSABS 0.0 01/18/2015 0341   CBG's (last 3):  No results for input(s): GLUCAP in the last 72 hours. LFT's Lab Results  Component Value Date   ALT 17 01/18/2015   AST 27 01/18/2015   ALKPHOS 47 01/18/2015   BILITOT 0.7 01/18/2015    Studies/Results: No results found.  Medications:  I have reviewed the patient's current medications. Scheduled Medications: . baclofen  5 mg Oral BID   . clopidogrel  75 mg Oral Daily  . dorzolamide-timolol  1 drop Both Eyes BID  . heparin  5,000 Units Subcutaneous 3 times per day  . latanoprost  1 drop Both Eyes QHS  . levothyroxine  88 mcg Oral QAC breakfast  . metoprolol tartrate  25 mg Oral BID  . pantoprazole  40 mg Oral BID  . pravastatin  40 mg Oral q1800   PRN Medications: acetaminophen, HYDROcodone-acetaminophen, ondansetron **OR** ondansetron (ZOFRAN) IV, sorbitol  Assessment/Plan: Principal Problem:   Cerebral infarction due to thrombosis of cerebral artery Active Problems:   Essential hypertension   Spinal stenosis, lumbar region, with neurogenic claudication   Left hemiparesis  1.Functional deficits secondary to status post lumbar decompression 01/17/2015 with postoperative right ACA, left thalamic infarcts on 7/13. Status post loop recorder 7/14; initiated rehab 7/15 - continue ongoing IP rehab tx 2. DVT Prophylaxis/Anticoagulation: Subcutaneous heparin indicated given VTE risk. Monitor platelet counts and any signs of bleeding 3. Pain Management: Baclofen 5 mg twice a day, hydrocodone as needed. -adjust regimen as tolerate and required as she increases activity--close observation to dosing in this 81 yo female 4. Hypertension. Lopressor 25 mg twice a day. Fair control at present. Has had periodic BP elevation since CVA. 5. Neuropsych: This patient is not yet capable of making decisions on her own behalf. 6. Skin/Wound Care: Routine skin checks and local  care to surgical site. Encourage adequate nutrition 7. Fluids/Electrolytes/Nutrition: follow I&O. check follow-up chemistries on monday 8. Hyperlipidemia. Pravachol  Length of stay, days: 2  Valerie A. Felicity Coyer, MD 01/22/2015, 9:03 AM

## 2015-01-22 NOTE — Progress Notes (Signed)
Pt. has not voided; I/O caths at 600-700cc at 8 hour interval this w/e, however, scan at 1400 ( 8hr) = 221, cathed for 225.  Urine amber, fluids encouraged.  Pt with c/o nausea this afternoon, appetite poor, resolved with Zofran.  Continues to have muscle spasms BLE's.  1 Vicodin tab x2 today; scheduled BAclofen.

## 2015-01-23 ENCOUNTER — Inpatient Hospital Stay (HOSPITAL_COMMUNITY): Payer: Medicare Other

## 2015-01-23 ENCOUNTER — Encounter: Payer: Self-pay | Admitting: Internal Medicine

## 2015-01-23 ENCOUNTER — Inpatient Hospital Stay (HOSPITAL_COMMUNITY): Payer: Medicare Other | Admitting: Speech Pathology

## 2015-01-23 ENCOUNTER — Inpatient Hospital Stay (HOSPITAL_COMMUNITY): Payer: Medicare Other | Admitting: Occupational Therapy

## 2015-01-23 DIAGNOSIS — R609 Edema, unspecified: Secondary | ICD-10-CM

## 2015-01-23 LAB — COMPREHENSIVE METABOLIC PANEL
ALT: 17 U/L (ref 14–54)
ANION GAP: 7 (ref 5–15)
AST: 24 U/L (ref 15–41)
Albumin: 2.7 g/dL — ABNORMAL LOW (ref 3.5–5.0)
Alkaline Phosphatase: 46 U/L (ref 38–126)
BUN: 10 mg/dL (ref 6–20)
CALCIUM: 8.9 mg/dL (ref 8.9–10.3)
CHLORIDE: 94 mmol/L — AB (ref 101–111)
CO2: 29 mmol/L (ref 22–32)
Creatinine, Ser: 0.85 mg/dL (ref 0.44–1.00)
GFR calc Af Amer: >60 mL/min (ref >60)
Glucose, Bld: 105 mg/dL — ABNORMAL HIGH (ref 65–99)
Potassium: 3.3 mmol/L — ABNORMAL LOW (ref 3.5–5.1)
SODIUM: 130 mmol/L — AB (ref 135–145)
Total Bilirubin: 0.7 mg/dL (ref 0.3–1.2)
Total Protein: 6.5 g/dL (ref 6.5–8.1)

## 2015-01-23 LAB — CBC WITH DIFFERENTIAL/PLATELET
BASOS PCT: 0 % (ref 0–1)
Basophils Absolute: 0 10*3/uL (ref 0.0–0.1)
Eosinophils Absolute: 0.2 10*3/uL (ref 0.0–0.7)
Eosinophils Relative: 2 % (ref 0–5)
HEMATOCRIT: 35.1 % — AB (ref 36.0–46.0)
HEMOGLOBIN: 11.7 g/dL — AB (ref 12.0–15.0)
Lymphocytes Relative: 15 % (ref 12–46)
Lymphs Abs: 1.2 10*3/uL (ref 0.7–4.0)
MCH: 28.3 pg (ref 26.0–34.0)
MCHC: 33.3 g/dL (ref 30.0–36.0)
MCV: 84.8 fL (ref 78.0–100.0)
Monocytes Absolute: 1 10*3/uL (ref 0.1–1.0)
Monocytes Relative: 12 % (ref 3–12)
NEUTROS PCT: 71 % (ref 43–77)
Neutro Abs: 5.8 10*3/uL (ref 1.7–7.7)
Platelets: 291 10*3/uL (ref 150–400)
RBC: 4.14 MIL/uL (ref 3.87–5.11)
RDW: 13.2 % (ref 11.5–15.5)
WBC: 8.1 10*3/uL (ref 4.0–10.5)

## 2015-01-23 MED ORDER — POTASSIUM CHLORIDE CRYS ER 20 MEQ PO TBCR
20.0000 meq | EXTENDED_RELEASE_TABLET | Freq: Two times a day (BID) | ORAL | Status: DC
  Administered 2015-01-23 – 2015-02-10 (×36): 20 meq via ORAL
  Filled 2015-01-23 (×44): qty 1

## 2015-01-23 NOTE — Progress Notes (Signed)
Occupational Therapy Session Note  Patient Details  Name: Angela Keith MRN: [id number] Date of Birth: [birth year]  Today's Date: 01/23/2015 OT Individual Time: 1300-1400 OT Individual Time Calculation (min): 60 min    Short Term Goals: Week 1:  OT Short Term Goal 1 (Week 1): Pt will perform UB dressing with mod A in order to decrease level of assist with self care. OT Short Term Goal 2 (Week 1): Pt will perform toileting with assist from 1 person for clothing management and hygiene.  OT Short Term Goal 3 (Week 1): Pt will perform LB dressing with max A or better for standing balance.  OT Short Term Goal 4 (Week 1): Pt will perform grooming with min A in order to increase I with self care.  Skilled Therapeutic Interventions/Progress Updates:    Pt seen this session to facilitate functional mobility, sitting balance, L side attention, L visual scanning, back precautions, family education with dtr.  Pt received in bed. Worked on bed mobility with max A with 2 helpers. On EOB worked on midline awareness and postural control with +2. Sit to stand 1x with total A and max cues to keep head lifted and facilitate wt bearing on R to avoid L lean.  Transferred to w/c with total A x2 and then onto toilet. Pt sat for 10 min with max A to maintain balance, but unable to void.  Transferred back to w/c and then worked on standing 2nd time at sink so pt would have Bankermirror feedback. Slightly improved midline awareness, but difficult for pt to achieve with severely impaired trunk control.  Increased R elbow flexor tone, educated pt's dtr on prolonged gentle stretching to elbow.  Pt tolerated session well. Pt adjusted in chair with lap tray for her next therapy session.   Therapy Documentation Precautions:  Precautions Precautions: Fall, Back Precaution Comments: daughter brought in lumbar corset from home for comfort, L inattention Restrictions Weight Bearing Restrictions: No   Pain: Pain Assessment Pain  Assessment: Faces Faces Pain Scale: No hurt ADL:  See FIM for current functional status  Therapy/Group: Individual Therapy  SAGUIER,JULIA 01/23/2015, 2:09 PM

## 2015-01-23 NOTE — Progress Notes (Signed)
Social Work Patient ID: Angela Keith, female   DOB: 1930/07/06, 81 y.o.   MRN: [id number]   CSW attempted to meet with pt to introduce self, complete assessment, and offer support, however pt's RN was in the room.  CSW will visit with pt 01-24-15.

## 2015-01-23 NOTE — Progress Notes (Signed)
Pt. continues with BLE spasms, lt>rt.  RLE 'restless.'  Mostly occuring in evening.  1 Vicodin for pain with spasms given at 1630.  Pt ( and family) report no relief at 1800.  Requesting 2nd Vicodin, past 1 hour mark.  [place], no new orders.  Family also reports itching after Vicodin given, requests MD be called and pain med changed before 2030 dose given.  [facility], Charity fundraiserN.

## 2015-01-23 NOTE — Progress Notes (Signed)
VASCULAR LAB PRELIMINARY  PRELIMINARY  PRELIMINARY  PRELIMINARY  Bilateral lower extremity venous Dopplers completed.    Preliminary report:  There is no DVT or SVT noted in the bilateral lower extremities.   Caralyn Twining, RVT 01/23/2015, 6:50 PM

## 2015-01-23 NOTE — Progress Notes (Signed)
Speech Language Pathology Daily Session Note  Patient Details  Name: Angela Keith MRN: [id number] Date of Birth: May 31, 1930  Today's Date: 01/23/2015 SLP Individual Time: 1400-1500 SLP Individual Time Calculation (min): 60 min  Short Term Goals: Week 1: SLP Short Term Goal 1 (Week 1): Patient will utilize external aids for orientation to time with Mod mutlimodal cues SLP Short Term Goal 2 (Week 1): Patient will attend to left during familiar structured tasks with Mod multimodal cues  SLP Short Term Goal 3 (Week 1): Patient will select attention to basic, familar tasks for 10 minutes with Mod multimodal cues  SLP Short Term Goal 4 (Week 1): Patient will identify 2 physical and 2 cognitive deficits with Mod question cues   Skilled Therapeutic Interventions: Skilled treatment session focused on cognitive goals. SLP facilitated session by providing extra time and Mod A visual and verbal cues for functional problem solving with a basic money management task.  Patient also completed a basic written expression task with Mod I and required Min A verbal cues for scanning to the left field of environment during a basic reading comprehension task at the sentence and paragraph level. Patient demonstrated selective attention to tasks in a mildly distracting environment for 2 minutes with Mod A verbal cues for redirection. Patient required extra time for verbal responses throughout session, suspect due to patient being hard of hearing and delayed processing. Patient left upright in wheelchair with family present. Continue with current plan of care.    FIM:  Comprehension Comprehension Mode: Auditory Comprehension: 4-Understands basic 75 - 89% of the time/requires cueing 10 - 24% of the time Expression Expression Mode: Verbal Expression: 4-Expresses basic 75 - 89% of the time/requires cueing 10 - 24% of the time. Needs helper to occlude trach/needs to repeat words. Social Interaction Social Interaction:  3-Interacts appropriately 50 - 74% of the time - May be physically or verbally inappropriate. Problem Solving Problem Solving: 3-Solves basic 50 - 74% of the time/requires cueing 25 - 49% of the time Memory Memory: 2-Recognizes or recalls 25 - 49% of the time/requires cueing 51 - 75% of the time  Pain Pain Assessment Pain Assessment: No/denies pain Faces Pain Scale: No hurt  Therapy/Group: Individual Therapy  [name] 01/23/2015, 4:02 PM

## 2015-01-23 NOTE — Progress Notes (Signed)
[facility] PHYSICAL MEDICINE & REHABILITATION     PROGRESS NOTE    Subjective/Complaints: Pt had some spasms in both legs over weekend. Nausea briefly yesterday as well.   ROS: Pt denies fever, rash/itching, headache, blurred or double vision,   vomiting, abdominal pain, diarrhea, chest pain, shortness of breath, palpitations, dysuria, dizziness, neck or back pain, bleeding, anxiety, or depression   Objective: Vital Signs: Blood pressure 174/70, pulse 62, temperature 98.9 F (37.2 C), temperature source Oral, resp. rate 18, SpO2 98 %. No results found.  Recent Labs  01/20/15 1825 01/23/15 0529  WBC 10.2 8.1  HGB 12.1 11.7*  HCT 37.2 35.1*  PLT 216 291    Recent Labs  01/20/15 1825 01/23/15 0529  NA  --  130*  K  --  3.3*  CL  --  94*  GLUCOSE  --  105*  BUN  --  10  CREATININE 1.04* 0.85  CALCIUM  --  8.9   CBG (last 3)  No results for input(s): GLUCAP in the last 72 hours.  Wt Readings from Last 3 Encounters:  01/17/15 71.215 kg (157 lb)  01/11/15 71.385 kg (157 lb 6 oz)  11/08/14 72.576 kg (160 lb)    Physical Exam:  Constitutional:  81 year old right-handed female in no distress Eyes: EOM are normal.  Mouth: dentition fair, oral mucosa moist Neck: Normal range of motion. Neck supple. No thyromegaly present.  Cardiovascular: Normal rate and regular rhythm.no murmurs or rubs  Respiratory: Effort normal and breath sounds normal. No respiratory distress. no wheezes GI: Soft. Bowel sounds are normal. She exhibits no distension.  Neurological: She is alert and attentive. Patient has hearing aids in but still struggles to hear. She was able to provide her name and place. Follows simple commands despite HOH. Limited insight and awareness. [name] spastic (1-2/4) with underlying 1-2/5 strength proximal to distal.. LLE: 1-2 hf, ke and 1 ankle. Notable apraxia as well as inattention to left. DTR's 1+ right and left. no resting tone in legs. No visible spasms or  clonus Skin: Skin is warm and dry.  Psychiatric: She has a normal mood and affect   Assessment/Plan: 1. Functional deficits secondary to right ACA and left thalamic infarcts after recent lumbar decompression which require 3+ hours per day of interdisciplinary therapy in a comprehensive inpatient rehab setting. Physiatrist is providing close team supervision and 24 hour management of active medical problems listed below. Physiatrist and rehab team continue to assess barriers to discharge/monitor patient progress toward functional and medical goals. FIM: FIM - Bathing Bathing Steps Patient Completed: Left Arm, Abdomen, Right upper leg, Left upper leg, Chest Bathing: 3: Mod-Patient completes 5-7 33f 10 parts or 50-74%  FIM - Upper Body Dressing/Undressing Upper body dressing/undressing steps patient completed: Thread/unthread right sleeve of pullover shirt/dresss Upper body dressing/undressing: 2: Max-Patient completed 25-49% of tasks FIM - Lower Body Dressing/Undressing Lower body dressing/undressing: 1: Two helpers  FIM - Toileting Toileting: 1: Two helpers  FIM - Diplomatic Services operational officer Devices: Psychiatrist Transfers: 1-Two helpers  FIM - Banker Devices: Arm rests Bed/Chair Transfer: 1: Supine > Sit: Total A (helper does all/Pt. < 25%), 1: Sit > Supine: Total A (helper does all/Pt. < 25%), 1: Bed > Chair or W/C: Total A (helper does all/Pt. < 25%), 1: Chair or W/C > Bed: Total A (helper does all/Pt. < 25%)  FIM - Locomotion: Wheelchair Distance: 75 Locomotion: Wheelchair: 2: Travels 50 - 149 ft with moderate  assistance (Pt: 50 - 74%) FIM - Locomotion: Ambulation Locomotion: Ambulation Assistive Devices: Other (comment) (R hand rail) Ambulation/Gait Assistance: 1: +1 Total assist Locomotion: Ambulation: 1: Two helpers  Comprehension Comprehension Mode: Auditory Comprehension: 5-Set-up assist with hearing  device  Expression Expression Mode: Verbal Expression: 5-Expresses basic 90% of the time/requires cueing < 10% of the time.  Social Interaction Social Interaction: 4-Interacts appropriately 75 - 89% of the time - Needs redirection for appropriate language or to initiate interaction.  Problem Solving Problem Solving: 5-Solves basic 90% of the time/requires cueing < 10% of the time  Memory Memory Mode: Not assessed Memory: 2-Recognizes or recalls 25 - 49% of the time/requires cueing 51 - 75% of the time  Medical Problem List and Plan: 1. Functional deficits secondary to status post lumbar decompression 01/17/2015 with postoperative right ACA, left thalamic infarcts. Status post loop recorder -continue therapies 2. DVT Prophylaxis/Anticoagulation: Subcutaneous heparin indicated given VTE risk. Monitor platelet counts and any signs of bleeding -needs dopplers 3. Pain Management: continue Baclofen 5 mg twice a day for spasms. May also use heat---careful with dosing due to neurosedating effects  - hydrocodone as needed.   4. Hypertension. Lopressor 25 mg twice a day. Generally fair control. Still having periodic elevation since CVA. 5. Neuropsych: This patient is not yetcapable of making decisions on her own behalf. 6. Skin/Wound Care: Routine skin checks and local care to surgical site. Encourage adequate nutrition 7. Fluids/Electrolytes/Nutrition: labs reviewed---replace potassium  -follow up sodium/labs in am 8. Hyperlipidemia. Pravachol  LOS (Days) 3 A FACE TO FACE EVALUATION WAS PERFORMED  SWARTZ,ZACHARY T 01/23/2015 8:36 AM

## 2015-01-23 NOTE — Progress Notes (Signed)
Speech Language Pathology Daily Session Note  Patient Details  Name: Angela Keith MRN: [id number] Date of Birth: Nov 04, 1929  Today's Date: 01/23/2015 SLP Individual Time: 1555-1610 SLP Individual Time Calculation (min): 15 min 15 missed minutes due to nursing care  Short Term Goals: Week 1: SLP Short Term Goal 1 (Week 1): Patient will utilize external aids for orientation to time with Mod mutlimodal cues SLP Short Term Goal 2 (Week 1): Patient will attend to left during familiar structured tasks with Mod multimodal cues  SLP Short Term Goal 3 (Week 1): Patient will select attention to basic, familar tasks for 10 minutes with Mod multimodal cues  SLP Short Term Goal 4 (Week 1): Patient will identify 2 physical and 2 cognitive deficits with Mod question cues   Skilled Therapeutic Interventions: Skilled treatment session focused on addressing cognitive goals. SLP facilitated session by providing extra time and Mod A visual and verbal cues for left attention to a basic reading task.  Patient benefited from visual markers/anchors on the left and SLP was then able to fade cues to [place] assist level.  Family present for session and SLP briefly educated them on effective strategies to target left attention.; they verbalized understanding of information and reported they will bring in a boo from home.  Continue with current plan of care.    FIM:  Comprehension Comprehension Mode: Auditory Comprehension: 4-Understands basic 75 - 89% of the time/requires cueing 10 - 24% of the time Expression Expression Mode: Verbal Expression: 4-Expresses basic 75 - 89% of the time/requires cueing 10 - 24% of the time. Needs helper to occlude trach/needs to repeat words. Social Interaction Social Interaction: 3-Interacts appropriately 50 - 74% of the time - May be physically or verbally inappropriate. Problem Solving Problem Solving: 3-Solves basic 50 - 74% of the time/requires cueing 25 - 49% of the  time Memory Memory: 2-Recognizes or recalls 25 - 49% of the time/requires cueing 51 - 75% of the time  Pain Pain Assessment Pain Assessment: 0-10 Pain Score: 6  Faces Pain Scale: No hurt Pain Type: Acute pain Pain Location: Leg Pain Orientation: Right Pain Descriptors / Indicators: Aching;Spasm Pain Onset: Gradual Patients Stated Pain Goal: 2 Pain Intervention(s): RN made aware;Repositioned Multiple Pain Sites: No  Therapy/Group: Individual Therapy  Angela FerrettiMelissa Tyjai Keith, M.A., CCC-SLP [phone]  Angela Keith 01/23/2015, 4:14 PM

## 2015-01-23 NOTE — IPOC Note (Signed)
Overall Plan of Care [facility](IPOC) Patient Details Name: Angela PavlovRachel E Keith MRN: [id number] DOB: 1930/05/19  Admitting Diagnosis: lumbar decomp sullivan  Hospital Problems: Principal Problem:   Cerebral infarction due to thrombosis of cerebral artery Active Problems:   Essential hypertension   Spinal stenosis, lumbar region, with neurogenic claudication   Left hemiparesis     Functional Problem List: Nursing Bladder, Endurance, Medication Management, Motor, Perception, Safety, Sensory  PT Balance, Endurance, Motor, Safety, Perception  OT Balance, Vision, Cognition, Endurance, Motor, Sensory, Safety  SLP Behavior, Cognition, Safety  TR         Basic ADL's: OT Eating, Grooming, Bathing, Dressing, Toileting     Advanced  ADL's: OT  (n/a)     Transfers: PT Bed Mobility, Bed to Chair, Customer service managerCar  OT Toilet, Tub/Shower     Locomotion: PT Ambulation, Psychologist, prison and probation servicesWheelchair Mobility, Stairs     Additional Impairments: OT Fuctional Use of Upper Extremity  SLP Social Cognition   Problem Solving, Memory, Attention, Awareness, Social Interaction  TR      Anticipated Outcomes Item Anticipated Outcome  Self Feeding supervision  Swallowing      Basic self-care  supervision - min A  Toileting  Min A   Bathroom Transfers Min A  Bowel/Bladder  manage bladder with level 5 assist and bowel with level 5 assist of BSC  Transfers  min A for transfers  Locomotion  min A for gait, S w/c mobility, mod A for stairs.   Communication     Cognition  Min with basic tasks  Pain  PAin controlled with prn medication and able to complete adls with min assist  Safety/Judgment  maintain safety with level 5 assist   Therapy Plan: PT Intensity: Minimum of 1-2 x/day ,45 to 90 minutes PT Frequency: 5 out of 7 days PT Duration Estimated Length of Stay: 25 to 28 days OT Intensity: Minimum of 1-2 x/day, 45 to 90 minutes OT Frequency: 5 out of 7 days OT Duration/Estimated Length of Stay: 25-28 days SLP Intensity:  Minumum of 1-2 x/day, 30 to 90 minutes SLP Frequency: 3 to 5 out of 7 days SLP Duration/Estimated Length of Stay: 25-28 days        Team Interventions: Nursing Interventions Patient/Family Education, Bladder Management, Cognitive Remediation/Compensation, Dysphagia/Aspiration Precaution Training, Disease Management/Prevention, Discharge Planning, Pain Management, Medication Management  PT interventions Ambulation/gait training, Balance/vestibular training, Cognitive remediation/compensation, Disease management/prevention, Discharge planning, Community reintegration, DME/adaptive equipment instruction, Functional mobility training, Patient/family education, Neuromuscular re-education, Splinting/orthotics, Therapeutic Exercise, Therapeutic Activities, Stair training, UE/LE Strength taining/ROM, UE/LE Coordination activities, Visual/perceptual remediation/compensation, Wheelchair propulsion/positioning  OT Interventions Warden/rangerBalance/vestibular training, FirefighterCommunity reintegration, Development worker, international aidunctional electrical stimulation, Neuromuscular re-education, Patient/family education, Self Care/advanced ADL retraining, Therapeutic Exercise, UE/LE Coordination activities, Wheelchair propulsion/positioning, Visual/perceptual remediation/compensation, UE/LE Strength taining/ROM, Therapeutic Activities, Psychosocial support, Pain management, Functional mobility training, DME/adaptive equipment instruction, Discharge planning, Cognitive remediation/compensation  SLP Interventions Cognitive remediation/compensation, Cueing hierarchy, Environmental controls, Functional tasks, Internal/external aids, Patient/family education  TR Interventions    SW/CM Interventions Discharge Planning, Psychosocial Support, Patient/Family Education    Team Discharge Planning: Destination: PT-Home ,OT- Home , SLP- (TBD) Projected Follow-up: PT-Home health PT, OT-  Home health OT, 24 hour supervision/assistance, Skilled nursing facility, SLP-Home Health  SLP, 24 hour supervision/assistance, Skilled Nursing facility Projected Equipment Needs: PT-To be determined, OT- To be determined, SLP-None recommended by SLP Equipment Details: PT- , OT-  Patient/family involved in discharge planning: PT- Patient, Family member/caregiver,  OT-Patient, SLP-Patient unable/family or caregive not available  MD ELOS: 20-25d Medical Rehab Prognosis:  Good  Assessment: 81 y.o. right handed female with history of hypertension, TIA versus CVA March 2016 with MRI negative and patient was placed on dual antiplatelets therapy with carotid Dopplers and echocardiogram unremarkable and recent lumbar L2-3, 3-4 decompressive laminectomy for lumbar stenosis with radiculopathy 01/17/2015 per Dr. Jordan Likes and discharged to home same day. Presented 01/18/2015 a left sided weakness. MRI/MRA of the brain showed right distal ACA segment occlusion with acute posterior right ACA infarct as well as punctate acute infarct medial left thalamus with no mass effect.    Now requiring 24/7 Rehab RN,MD, as well as CIR level PT, OT and SLP.  Treatment team will focus on ADLs and mobility with goals set at [facility]  See Team Conference Notes for weekly updates to the plan of care

## 2015-01-23 NOTE — Progress Notes (Signed)
Physical Therapy Session Note  Patient Details  Name: Angela Keith MRN: [id number] Date of Birth: 1930/07/02  Today's Date: 01/23/2015 PT Individual Time: 0800-0900 PT Individual Time Calculation (min): 60 min   Short Term Goals: Week 1:  PT Short Term Goal 1 (Week 1): Pt will transfer supine to edge of bed, edge of bed to supine with max A.  PT Short Term Goal 2 (Week 1): Pt will transfer bed to chair, chair to bed with max A.  PT Short Term Goal 3 (Week 1): Pt will ambulate about 25 feet with LRAD and max A.  PT Short Term Goal 4 (Week 1): Pt will propel w/c about 75 feet with min A.  PT Short Term Goal 5 (Week 1): Pt will ascend/descend 2 stairs with 1 rail and max A.   Skilled Therapeutic Interventions/Progress Updates:   Therapist and RN assisted pt with donning bilateral hearing aids, though pt still very HOH. Session focused on functional bed mobility training to get to EOB and neuro re-ed for to eat breakfast in upright position seated EOB, address L inattention, address postural control/reorientation to midline, and overall sitting balance/tolerance/endurance. Pt required max A for facilitation to EOB using bed rail for RUE support and verbal cues for technique. Able to maintain sitting balance with close S for a few seconds if using RUE on rail for support but unable to maintain longer requiring total A for balance due to pushing to the L and forward lean. Requires max verbal and tactile cueing to attempt to maintain upright posture while seated EOB with focus on reorientation to midline. Used NIKEStedy Lift with total A +2 for sit to stand with cues for hand placement and positioning and transfered into w/c. Pt maintaining flexed posture while on Stedy and total A to maintain head up and trunk upright. Positioned with lap tray and pillow under [name] for positioning and edema management. W/c propulsion with hemi technique in hallway with mod A (though limited due to height of w/c so may need  to trial hemi w/c in order for foot to reach to floor). Left up at nurses's station at end of session for safety with NT and RN aware. Safety belt donned.   Therapy Documentation Precautions:  Precautions Precautions: Fall, Back Precaution Comments: daughter brought in lumbar corset from home for comfort, L inattention Restrictions Weight Bearing Restrictions: No  Pain:  Denies pain.  See FIM for current functional status  Therapy/Group: Individual Therapy  Karolee StampsGray, Brandyn Lowrey Darrol PokeBrescia  Kymani Laursen B. Sissy Goetzke, PT, DPT  01/23/2015, 1:47 PM

## 2015-01-23 NOTE — Progress Notes (Signed)
At 2200, no void, bladder scan=808, I & O cath=800cc's. At 0600, voided large incontinent, PVR=404, I & O cath=350cc's. Moderate amount of sediment noted at end of cath. [facility] son felt 1 was not effective for spasms/pain. Rested good during night. Alfredo MartinezMurray, Angellina Ferdinand A

## 2015-01-24 ENCOUNTER — Inpatient Hospital Stay (HOSPITAL_COMMUNITY): Payer: Medicare Other | Admitting: Speech Pathology

## 2015-01-24 ENCOUNTER — Inpatient Hospital Stay (HOSPITAL_COMMUNITY): Payer: Medicare Other

## 2015-01-24 ENCOUNTER — Inpatient Hospital Stay (HOSPITAL_COMMUNITY): Payer: Medicare Other | Admitting: Occupational Therapy

## 2015-01-24 LAB — BASIC METABOLIC PANEL
ANION GAP: 9 (ref 5–15)
BUN: 11 mg/dL (ref 6–20)
CHLORIDE: 98 mmol/L — AB (ref 101–111)
CO2: 23 mmol/L (ref 22–32)
CREATININE: 0.82 mg/dL (ref 0.44–1.00)
Calcium: 8.9 mg/dL (ref 8.9–10.3)
GFR calc non Af Amer: >60 mL/min (ref >60)
Glucose, Bld: 92 mg/dL (ref 65–99)
POTASSIUM: 4.2 mmol/L (ref 3.5–5.1)
Sodium: 130 mmol/L — ABNORMAL LOW (ref 135–145)

## 2015-01-24 MED ORDER — CYCLOBENZAPRINE HCL 5 MG PO TABS
5.0000 mg | ORAL_TABLET | Freq: Two times a day (BID) | ORAL | Status: DC | PRN
  Administered 2015-01-24 – 2015-01-25 (×3): 5 mg via ORAL
  Filled 2015-01-24 (×3): qty 1

## 2015-01-24 MED ORDER — GABAPENTIN 100 MG PO CAPS
100.0000 mg | ORAL_CAPSULE | Freq: Every day | ORAL | Status: DC
  Administered 2015-01-24 – 2015-01-25 (×2): 100 mg via ORAL
  Filled 2015-01-24 (×4): qty 1

## 2015-01-24 MED ORDER — TAMSULOSIN HCL 0.4 MG PO CAPS
0.4000 mg | ORAL_CAPSULE | Freq: Every day | ORAL | Status: DC
  Administered 2015-01-24 – 2015-02-09 (×17): 0.4 mg via ORAL
  Filled 2015-01-24 (×21): qty 1

## 2015-01-24 MED ORDER — METHOCARBAMOL 500 MG PO TABS: 500.0000 mg | ORAL_TABLET | Freq: Four times a day (QID) | ORAL | Status: DC | PRN

## 2015-01-24 MED ORDER — TRAMADOL HCL 50 MG PO TABS
25.0000 mg | ORAL_TABLET | Freq: Four times a day (QID) | ORAL | Status: DC | PRN
  Administered 2015-01-24 – 2015-02-09 (×11): 50 mg via ORAL
  Filled 2015-01-24 (×12): qty 1

## 2015-01-24 NOTE — Progress Notes (Signed)
Speech Language Pathology Daily Session Note  Patient Details  Name: Angela Keith MRN: [id number] Date of Birth: Aug 31, 1929  Today's Date: 01/24/2015 SLP Individual Time: 1000-1100 SLP Individual Time Calculation (min): 60 min  Short Term Goals: Week 1: SLP Short Term Goal 1 (Week 1): Patient will utilize external aids for orientation to time with Mod mutlimodal cues SLP Short Term Goal 2 (Week 1): Patient will attend to left during familiar structured tasks with Mod multimodal cues  SLP Short Term Goal 3 (Week 1): Patient will select attention to basic, familar tasks for 10 minutes with Mod multimodal cues  SLP Short Term Goal 4 (Week 1): Patient will identify 2 physical and 2 cognitive deficits with Mod question cues   Skilled Therapeutic Interventions: Skilled treatment session focused on cognitive goals. SLP facilitated session by providing extra time and supervision verbal cues for problem solving, sustained attention and attention to left field of environment during a basic calendar making task. Patient also completed a mildly complex scanning task in which she had to name items within a certain category and then spell them out using a letter board that is in random order with supervision verbal cues for sustained attention to task for 2 minutes. Patient identified 2 physical and 2 cognitive deficits with Min A question cues and required extra time and Min A verbal cues for problem solving and attention while utilizing the menu to choose her lunch. Patient left upright in wheelchair with quick release belt in place and family present. Continue with current plan of care.    FIM:  Comprehension Comprehension Mode: Auditory Comprehension: 5-Set-up assist with hearing device Expression Expression Mode: Verbal Expression: 5-Expresses basic 90% of the time/requires cueing < 10% of the time. Social Interaction Social Interaction: 4-Interacts appropriately 75 - 89% of the time - Needs  redirection for appropriate language or to initiate interaction. Problem Solving Problem Solving: 4-Solves basic 75 - 89% of the time/requires cueing 10 - 24% of the time Memory Memory: 3-Recognizes or recalls 50 - 74% of the time/requires cueing 25 - 49% of the time  Pain Pain Assessment Pain Assessment: No/denies pain  Therapy/Group: Individual Therapy  Annais Crafts 01/24/2015, 3:31 PM

## 2015-01-24 NOTE — Progress Notes (Addendum)
[place] PHYSICAL MEDICINE & REHABILITATION     PROGRESS NOTE    Subjective/Complaints: Pt had   Spasms agian in both legs over weekend. Legs are restless. vicodin causing some ?itching. Requiring regular I/O caths  ROS: Pt denies fever, rash/itching, headache, blurred or double vision,   vomiting, abdominal pain, diarrhea, chest pain, shortness of breath, palpitations, dysuria, dizziness, neck or back pain, bleeding, anxiety, or depression   Objective: Vital Signs: Blood pressure 158/94, pulse 76, temperature 98.1 F (36.7 C), temperature source Oral, resp. rate 18, SpO2 94 %. No results found.  Recent Labs  01/23/15 0529  WBC 8.1  HGB 11.7*  HCT 35.1*  PLT 291    Recent Labs  01/23/15 0529 01/24/15 0428  NA 130* 130*  K 3.3* 4.2  CL 94* 98*  GLUCOSE 105* 92  BUN 10 11  CREATININE 0.85 0.82  CALCIUM 8.9 8.9   CBG (last 3)  No results for input(s): GLUCAP in the last 72 hours.  Wt Readings from Last 3 Encounters:  01/17/15 71.215 kg (157 lb)  01/11/15 71.385 kg (157 lb 6 oz)  11/08/14 72.576 kg (160 lb)    Physical Exam:  Constitutional:  81 year old right-handed female in no distress Eyes: EOM are normal.  Mouth: dentition fair, oral mucosa moist Neck: Normal range of motion. Neck supple. No thyromegaly present.  Cardiovascular: Normal rate and regular rhythm.no murmurs or rubs  Respiratory: Effort normal and breath sounds normal. No respiratory distress. no wheezes GI: Soft. Bowel sounds are normal. She exhibits no distension.  Neurological: She is alert and attentive. She was able to provide her name and place. Follows simple commands despite HOH. Limited insight and awareness. [name] spastic (1-2/4) with underlying 1-2/5 strength proximal to distal.. LLE: 1-2 hf, ke and 1 ankle. Notable apraxia as well as inattention to left. DTR's 1+ right and left. still no resting tone in legs. No visible spasms or clonus Skin: Skin is warm and dry.   Psychiatric: She has a normal mood and affect   Assessment/Plan: 1. Functional deficits secondary to right ACA and left thalamic infarcts after recent lumbar decompression which require 3+ hours per day of interdisciplinary therapy in a comprehensive inpatient rehab setting. Physiatrist is providing close team supervision and 24 hour management of active medical problems listed below. Physiatrist and rehab team continue to assess barriers to discharge/monitor patient progress toward functional and medical goals. FIM: FIM - Bathing Bathing Steps Patient Completed: Left Arm, Abdomen, Right upper leg, Left upper leg, Chest Bathing: 3: Mod-Patient completes 5-7 66f 10 parts or 50-74%  FIM - Upper Body Dressing/Undressing Upper body dressing/undressing steps patient completed: Thread/unthread right sleeve of pullover shirt/dresss Upper body dressing/undressing: 2: Max-Patient completed 25-49% of tasks FIM - Lower Body Dressing/Undressing Lower body dressing/undressing: 1: Two helpers  FIM - Toileting Toileting: 1: Two helpers  FIM - Diplomatic Services operational officer Devices: Psychiatrist Transfers: 1-Two helpers, 1-From toilet/BSC: Total A (helper does all/Pt. < 25%), 1-To toilet/BSC: Total A (helper does all/Pt. < 25%)  FIM - Bed/Chair Transfer Bed/Chair Transfer Assistive Devices: Bed rails, HOB elevated (Stedy) Bed/Chair Transfer: 2: Supine > Sit: Max A (lifting assist/Pt. 25-49%), 1: Two helpers, 1: Bed > Chair or W/C: Total A (helper does all/Pt. < 25%)  FIM - Locomotion: Wheelchair Distance: 75 Locomotion: Wheelchair: 2: Travels 50 - 149 ft with moderate assistance (Pt: 50 - 74%) FIM - Locomotion: Ambulation Locomotion: Ambulation Assistive Devices: Other (comment) (R hand rail) Ambulation/Gait Assistance: 1: +1  Total assist Locomotion: Ambulation: 0: Activity did not occur  Comprehension Comprehension Mode: Auditory Comprehension: 5-Set-up assist with  hearing device  Expression Expression Mode: Verbal Expression: 5-Expresses basic 90% of the time/requires cueing < 10% of the time.  Social Interaction Social Interaction: 4-Interacts appropriately 75 - 89% of the time - Needs redirection for appropriate language or to initiate interaction.  Problem Solving Problem Solving: 5-Solves basic 90% of the time/requires cueing < 10% of the time  Memory Memory Mode: Not assessed Memory: 2-Recognizes or recalls 25 - 49% of the time/requires cueing 51 - 75% of the time  Medical Problem List and Plan: 1. Functional deficits secondary to status post lumbar decompression 01/17/2015 with postoperative right ACA, left thalamic infarcts. Status post loop recorder -continue therapies 2. DVT Prophylaxis/Anticoagulation: Subcutaneous heparin indicated given VTE risk. Monitor platelet counts and any signs of bleeding -doppler report negative 3. Pain Management:    -still having spasms.  -dc baclofen---trial of robaxin 500mg  q6 prn  -dc hydrocodone due to itching---trial of tramadol 25-50mg  q6 prn   4. Hypertension. Lopressor 25 mg twice a day. Generally fair control. Still having periodic elevation since CVA. 5. Neuropsych: This patient is not yet capable of making decisions on her own behalf. 6. Skin/Wound Care: Routine skin checks and local care to surgical site. Encourage adequate nutrition 7. Fluids/Electrolytes/Nutrition: labs reviewed today--replacing potassium  -potassium recovering  -sodium stable at 130 8. Hyperlipidemia. Pravachol 9. Neurogenic bladder: check ua/cx  -I/O cath prn  -will try low dose flomax also--HS  LOS (Days) 4 A FACE TO FACE EVALUATION WAS PERFORMED  Daris Aristizabal T 01/24/2015 7:04 AM

## 2015-01-24 NOTE — Progress Notes (Signed)
Orthopedic Tech Progress Note Patient Details:  Norva PavlovRachel E Bonner 05/06/1930 [id number] Brace order completed by [employer] vendor. Patient ID: Norva PavlovRachel E Platz, female   DOB: 05/06/1930, 81 y.o.   MRN: [id number]   Jennye MoccasinHughes, Jorden Minchey Craig 01/24/2015, 4:56 PM

## 2015-01-24 NOTE — Progress Notes (Signed)
Orthopedic Tech Progress Note Patient Details:  Angela PavlovRachel E Keith 1929/08/29 [id number] Called [employer] for brace order Patient ID: Angela Keith, female   DOB: 1929/08/29, 81 y.o.   MRN: [id number]   Angela MoccasinHughes, Angela Keith 01/24/2015, 3:14 PM

## 2015-01-24 NOTE — Progress Notes (Signed)
Social Work Assessment and Plan  Patient Details  Name: Angela Keith MRN: [id number] Date of Birth: 17-Feb-1930  Today's Date: 01/24/2015  Problem List:  Patient Active Problem List   Diagnosis Date Noted  . Left hemiparesis   . Cryptogenic stroke   . H/O TIA (transient ischemic attack) and stroke   . Spinal stenosis, lumbar region, with neurogenic claudication 01/17/2015  . Lumbar stenosis 01/17/2015  . 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 Essential hypertension 11/10/2014  . HLD (hyperlipidemia) 11/10/2014  . Back pain with left-sided radiculopathy 11/08/2014  . Cerebral infarction due to thrombosis of cerebral artery   . Hyperlipidemia LDL goal <70 09/07/2014  . Presumed CVA (cerebral infarction) s/p IV tPA, MRI negative 09/05/2014  . Hypertensive urgency 09/05/2014  . Right bundle branch block 03/21/2014  . Cough 10/31/2012  . Nausea vomiting and diarrhea 10/30/2012  . Elevated lipase 10/30/2012  . HTN (hypertension) 10/30/2012  . Unspecified hypothyroidism 10/30/2012   Past Medical History:  Past Medical History  Diagnosis Date  . Hypothyroidism   . Arthritis   . GERD (gastroesophageal reflux disease)   . Dysphagia   . Hyperlipidemia   . Right bundle branch block 03/21/2014  . HTN (hypertension) 10/30/2012  . Unspecified hypothyroidism 10/30/2012  . Severe hearing loss   . DJD (degenerative joint disease), lumbar   . Rotoscoliosis   . Glaucoma   . DJD (degenerative joint disease), cervical   . Chronic neck pain   . Nocturia   . Insomnia   . Stroke   . Shortness of breath dyspnea    Past Surgical History:  Past Surgical History  Procedure Laterality Date  . Breast surgery    . Back surgery      lower back   . Esophagogastroduodenoscopy (egd) with esophageal dilation  06/08/2012    Procedure: ESOPHAGOGASTRODUODENOSCOPY (EGD) WITH ESOPHAGEAL DILATION;  Surgeon: Garlan Fair, MD;  Location: [facility];  Service: Endoscopy;  Laterality: N/A;   . Bladder laser surgery    . Left carpal tunnel surgery    . Facail surgery  2000    facelift and eyelid lift  . Cataract surgery    . Lumbar laminectomy/decompression microdiscectomy N/A 01/17/2015    Procedure: Laminectomy and Foraminotomy - L2-L3 - L3-L4;  Surgeon: Earnie Larsson, MD;  Location: [facility];  Service: Neurosurgery;  Laterality: N/A;  Laminectomy and Foraminotomy - L2-L3 - L3-L4  . Ep implantable device N/A 01/19/2015    Procedure: Loop Recorder Insertion;  Surgeon: Evans Lance, MD;  Location: [facility];  Service: Cardiovascular;  Laterality: N/A;  . Tee without cardioversion N/A 01/19/2015    Procedure: TRANSESOPHAGEAL ECHOCARDIOGRAM (TEE);  Surgeon: Pixie Casino, MD;  Location: [facility];  Service: Cardiovascular;  Laterality: N/A;   Social History:  reports that she has never smoked. She does not have any smokeless tobacco history on file. She reports that she does not drink alcohol or use illicit drugs.  Family / Support Systems Marital Status: Widow/Widower How Long?: 2 years Patient Roles: Parent (has a son, dtr-in-law, sister, two granddaughters) Children: Rosario Jacks - son - [phone]; [phone] (w)    Beatrice Lecher - dtr-in-law - [phone]; [phone] Other Supports: Colletta [place] - granddaughter; sister here from [place] Anticipated Caregiver: family Ability/Limitations of Caregiver: Sister from [place] may stay with patient.  Grandaughters and daughter in law may also assist.   Caregiver Availability: Intermittent (dtr-in-law has offered for pt to come stay with she  and pt's son, but pt has declined in the past.  Dtr-in-law told [facility] that she could get people to stay with her.  Sherren Mocha, helps quite a bit.) Family Dynamics: Supportive family.  Some tension between [place] and Dena (she is Stephanie's step-mother) during CSW's assessment.  Social History Preferred language: English Religion:  Protestant Cultural Background: Christian Read: Yes Write: Yes Employment Status: Retired Public relations account executive Issues: none reported Guardian/Conservator: N/A   Abuse/Neglect Physical Abuse: Denies Verbal Abuse: Denies Sexual Abuse: Denies Exploitation of patient/patient's resources: Denies Self-Neglect: Denies  Emotional Status Pt's affect, behavior and adjustment status: Pt reports never getting depressed.  She admits to feeling frustrated and CSW normalized this for her.  Pt did become tearful when she talked about the stress and sadness she sees in others as they are caring for her.  She is positive and motivated to get well. Recent Psychosocial Issues: Pt has had multiple medical issues in the last 5 months.  She likes to be as independent as possible and has trouble seeing her family worry about her. Psychiatric History: none reported Substance Abuse History: none reported  Patient / Family Perceptions, Expectations & Goals Pt/Family understanding of illness & functional limitations: Pt reports understanding her condition.  Pt's dtr feels she has not had good communication with MD/providers and is clearly frustrated about this.  CSW tried to support her in this and asked the PA, Linna Hoff, to come talk to them. Premorbid pt/family roles/activities: Pt would "find a way" to take care of things at home.  Was very independent.  Watches TV. Anticipated changes in roles/activities/participation: Pt will need min assist overall, and may use a w/c much of the time.  This would change her ability to do some of the things at home she's been doing. Pt/family expectations/goals: Pt would like to return to her home and be as independent as possible.  Community Resources Express Scripts: None Premorbid Home Care/DME Agencies: None Transportation available at discharge: family Resource referrals recommended:  (Stroke support group)  Discharge Planning Living Arrangements: Alone Support  Systems: Children, Other relatives Type of Residence: Private residence Insurance Resources: [employer], Multimedia programmer (specify) Sports administrator) Financial Resources: [place] Referred: No Money Management: Patient, Family Does the patient have any problems obtaining your medications?: No Home Management: Pt was managing much of this on her own, but family was bringing in [place] and running errands. Patient/Family Preliminary Plans: Pt's dtr-in-law has offered for pt to come stay at her home and sister may stay for a while with pt in pt's home.  Plan is still TBD. Barriers to Discharge: Steps (but can live on main floor) Social Work Anticipated Follow Up Needs: HH/OP Expected length of stay: 18-24 days  Clinical Impression CSW met with pt and her dtr-in-law, granddaughter Colletta [place]), and pt's sister to introduce self and role of CSW, as well as to complete assessment.  Pt's dtr-in-law and granddtr had some tension during CSW visit and dtr-in-law stepped out of the room for a bit.  Pt reports having little "meltdowns" especially related to how her family around her is coping with pt's hospitalization.  PA, Dan, visited with pt and family after CSW visit to discuss their concerns about leg spasms.  Pt has options for 24/7 care, but she will need to decide what to do as she was hoping to be independent.  CSW will continue to work with pt and family on this.  Pt was appreciative of CSW involvement.  Granddtr does help pt  at home some, but she was comfortable to receive updates from her step-mother.    Staria Birkhead, Silvestre Mesi 01/24/2015, 2:51 PM

## 2015-01-24 NOTE — Progress Notes (Signed)
Inpatient Rehabilitation Center Individual Statement of Services  Patient Name:  Angela PavlovRachel E Keith  Date:  01/24/2015  Welcome to the Inpatient Rehabilitation Center.  Our goal is to provide you with an individualized program based on your diagnosis and situation, designed to meet your specific needs.  With this comprehensive rehabilitation program, you will be expected to participate in at least 3 hours of rehabilitation therapies Monday-Friday, with modified therapy programming on the weekends.  Your rehabilitation program will include the following services:  Physical Therapy (PT), Occupational Therapy (OT), Speech Therapy (ST), 24 hour per day rehabilitation nursing, Case Management (Social Worker), Rehabilitation Medicine, Nutrition Services and Pharmacy Services  Weekly team conferences will be held on Tuesdays to discuss your progress.  Your Social Worker will talk with you frequently to get your input and to update you on team discussions.  Team conferences with you and your family in attendance may also be held.  Expected length of stay:  18 to 24 days  Overall anticipated outcome:  Minimal assistance  Depending on your progress and recovery, your program may change. Your Social Worker will coordinate services and will keep you informed of any changes. Your Social Worker's name and contact numbers are listed  below.  The following services may also be recommended but are not provided by the Inpatient Rehabilitation Center:   Driving Evaluations  Home Health Rehabiltiation Services  Outpatient Rehabilitation Services   Arrangements will be made to provide these services after discharge if needed.  Arrangements include referral to agencies that provide these services.  Your insurance has been verified to be:  Medicare and Tricare Your primary doctor is:  Dr. Candyce Churnobert Neville Gates  Pertinent information will be shared with your doctor and your insurance company.  Social Worker:  Staci AcostaJenny  Tema Alire, LCSW  [phone] or [phone]) [phone]  Information discussed with and copy given to patient by: Elvera LennoxPrevatt, Sokha Craker Capps, 01/24/2015, 3:00 PM

## 2015-01-24 NOTE — Progress Notes (Signed)
Speech Language Pathology Daily Session Note  Patient Details  Name: Angela PavlovRachel E Aburto MRN: [id number] Date of Birth: Aug 23, 1929  Today's Date: 01/24/2015 SLP Individual Time: 1510-1540 SLP Individual Time Calculation (min): 30 min  Short Term Goals: Week 1: SLP Short Term Goal 1 (Week 1): Patient will utilize external aids for orientation to time with Mod mutlimodal cues SLP Short Term Goal 2 (Week 1): Patient will attend to left during familiar structured tasks with Mod multimodal cues  SLP Short Term Goal 3 (Week 1): Patient will select attention to basic, familar tasks for 10 minutes with Mod multimodal cues  SLP Short Term Goal 4 (Week 1): Patient will identify 2 physical and 2 cognitive deficits with Mod question cues   Skilled Therapeutic Interventions: Pt was seen for skilled ST targeting cognitive goals.  Upon arrival, pt was seated upright in bed, awake, alert, and agreeable to participate in ST.  SLP facilitated the session with a basic card game targeting initiation, left attention, functional problem solving, and sustained attention.  Pt required overall min assist verbal cues to locate cards on the left side of her bedside table and was able to sustain attention to tasks for >3 minutes with min verbal cues for redirection.  Pt completed basic mental math calculations during the abovementioned task as well with overall min assist verbal cues for error awareness.  Pt was left in bed with family present at bedside and all needs left within reach.  Continue per current plan of care.    FIM:  Comprehension Comprehension Mode: Auditory Comprehension: 5-Set-up assist with hearing device Expression Expression Mode: Verbal Expression: 5-Expresses basic 90% of the time/requires cueing < 10% of the time. Social Interaction Social Interaction: 4-Interacts appropriately 75 - 89% of the time - Needs redirection for appropriate language or to initiate interaction. Problem Solving Problem  Solving: 4-Solves basic 75 - 89% of the time/requires cueing 10 - 24% of the time Memory Memory: 3-Recognizes or recalls 50 - 74% of the time/requires cueing 25 - 49% of the time  Pain Pain Assessment Pain Assessment: No/denies pain  Therapy/Group: Individual Therapy  Allah Reason, Melanee SpryNicole L 01/24/2015, 4:24 PM

## 2015-01-24 NOTE — Progress Notes (Signed)
Occupational Therapy Session Note  Patient Details  Name: Angela Keith MRN: [id number] Date of Birth: 24-Jun-1930  Today's Date: 01/24/2015 OT Individual Time: 0900-1000 OT Individual Time Calculation (min): 60 min    Short Term Goals: Week 1:  OT Short Term Goal 1 (Week 1): Pt will perform UB dressing with mod A in order to decrease level of assist with self care. OT Short Term Goal 2 (Week 1): Pt will perform toileting with assist from 1 person for clothing management and hygiene.  OT Short Term Goal 3 (Week 1): Pt will perform LB dressing with max A or better for standing balance.  OT Short Term Goal 4 (Week 1): Pt will perform grooming with min A in order to increase I with self care.  Skilled Therapeutic Interventions/Progress Updates:    1:1 Pt already dressed when arrived. Focus on using self care tasks to promote left UE functional movement, Visual attention to the left of body and environment, initiation and carry through of anterior weight shift, body awareness at midline with visual cues in mirror, sit to stands, standing, activity tolerance/ endurance etc. Pt able to initiation trunk flexion in the w/c to participate in grooming tasks (brushing hair, tooth brushing etc) at sink level. Pt demonstrates increased distal>proximal left UE strength and coordination. Pt with increase visual attention to left hand and coordination with bilateral tasks. Pt performed sit to stand with max  A+2 and then able to transition to mod A +2 with increased opportunities to stand. Transfer to toilet in bathroom with grab bar squat pivot with max  A+2 and then sit to stand with mod A +2. Standing balance with max A for 2nd person to performing clothing management. Stand step pivot back into w/c with total A +2 (pt 25%). Pt with increased fatigue be end of session. Left with SLP.Marland Kitchen.   Therapy Documentation Precautions:  Precautions Precautions: Fall, Back Precaution Comments: daughter brought in lumbar  corset from home for comfort, L inattention Restrictions Weight Bearing Restrictions: No Pain:  no c/o pain in session   See FIM for current functional status  Therapy/Group: Individual Therapy  Roney MansSmith, Evellyn Tuff [facility] 01/24/2015, 2:20 PM

## 2015-01-24 NOTE — Progress Notes (Signed)
Patient information reviewed and entered into eRehab system by Garcia Dalzell, RN, CRRN, PPS Coordinator.  Information including medical coding and functional independence measure will be reviewed and updated through discharge.     Per nursing patient was given "Data Collection Information Summary for Patients in Inpatient Rehabilitation Facilities with attached "Privacy Act Statement-Health Care Records" upon admission.  

## 2015-01-24 NOTE — Progress Notes (Signed)
Physical Therapy Session Note  Patient Details  Name: Angela Keith MRN: [id number] Date of Birth: 1930-05-15  Today's Date: 01/24/2015 PT Individual Time: 0800-0900 PT Individual Time Calculation (min): 60 min   Short Term Goals: Week 1:  PT Short Term Goal 1 (Week 1): Pt will transfer supine to edge of bed, edge of bed to supine with max A.  PT Short Term Goal 2 (Week 1): Pt will transfer bed to chair, chair to bed with max A.  PT Short Term Goal 3 (Week 1): Pt will ambulate about 25 feet with LRAD and max A.  PT Short Term Goal 4 (Week 1): Pt will propel w/c about 75 feet with min A.  PT Short Term Goal 5 (Week 1): Pt will ascend/descend 2 stairs with 1 rail and max A.   Skilled Therapeutic Interventions/Progress Updates:   Session focused on functional bed mobility re-training with max A and mod verbal cues for technique, neuro re-ed for sitting balance, orientation to midline, and postural control retraining while performing functional task to don clothing while seated EOB, sit to stand to be able to pull up pants with total A +2, transfer training with squat pivot technique with total A+2 with cues for technique and forward and R weightshift due to tendency to push to the L, and w/c management using hemitechnique. Trial with hemi height w/c today to allow for increased ability for foot to touch floor but pt with difficulty with motor planning and coordination requiring max A. Legrests will need to be further adjusted and depending on transfers in this height of w/c pt may need to go back to a little bit higher of a w/c. End of session left up in w/c with L arm support on lap tray and pillow and rest of breakfast left in front of patient to finish awaiting OT .  Therapy Documentation Precautions:  Precautions Precautions: Fall, Back Precaution Comments: daughter brought in lumbar corset from home for comfort, L inattention Restrictions Weight Bearing Restrictions: No  Pain: Denies  pain.  See FIM for current functional status  Therapy/Group: Individual Therapy  Karolee StampsGray, Angela Keith  Kamdyn Covel B. Zacaria Pousson, PT, DPT  01/24/2015, 11:00 AM

## 2015-01-25 ENCOUNTER — Inpatient Hospital Stay (HOSPITAL_COMMUNITY): Payer: Medicare Other | Admitting: Occupational Therapy

## 2015-01-25 ENCOUNTER — Inpatient Hospital Stay (HOSPITAL_COMMUNITY): Payer: Medicare Other | Admitting: Physical Therapy

## 2015-01-25 ENCOUNTER — Inpatient Hospital Stay (HOSPITAL_COMMUNITY): Payer: Medicare Other | Admitting: Speech Pathology

## 2015-01-25 ENCOUNTER — Inpatient Hospital Stay (HOSPITAL_COMMUNITY): Payer: Medicare Other | Admitting: *Deleted

## 2015-01-25 LAB — URINALYSIS, ROUTINE W REFLEX MICROSCOPIC
BILIRUBIN URINE: NEGATIVE
GLUCOSE, UA: NEGATIVE mg/dL
KETONES UR: NEGATIVE mg/dL
Nitrite: NEGATIVE
PH: 6 (ref 5.0–8.0)
Protein, ur: NEGATIVE mg/dL
Specific Gravity, Urine: 1.014 (ref 1.005–1.030)
Urobilinogen, UA: 1 mg/dL (ref 0.0–1.0)

## 2015-01-25 LAB — URINE MICROSCOPIC-ADD ON

## 2015-01-25 MED ORDER — AMOXICILLIN 250 MG PO CAPS
250.0000 mg | ORAL_CAPSULE | Freq: Three times a day (TID) | ORAL | Status: AC
  Administered 2015-01-25 – 2015-01-31 (×21): 250 mg via ORAL
  Filled 2015-01-25 (×24): qty 1

## 2015-01-25 NOTE — Progress Notes (Signed)
[place] [place] PHYSICAL MEDICINE & REHABILITATION     PROGRESS NOTE    Subjective/Complaints: Had a much better night. "spasms" better. Able to sleep. Happy about new brace  ROS: Pt denies fever, rash/itching, headache, blurred or double vision,   vomiting, abdominal pain, diarrhea, chest pain, shortness of breath, palpitations, dysuria, dizziness, neck or back pain, bleeding, anxiety, or depression   Objective: Vital Signs: Blood pressure 149/70, pulse 64, temperature 98 F (36.7 C), temperature source Oral, resp. rate 20, SpO2 96 %. No results found.  Recent Labs  01/23/15 0529  WBC 8.1  HGB 11.7*  HCT 35.1*  PLT 291    Recent Labs  01/23/15 0529 01/24/15 0428  NA 130* 130*  K 3.3* 4.2  CL 94* 98*  GLUCOSE 105* 92  BUN 10 11  CREATININE 0.85 0.82  CALCIUM 8.9 8.9   CBG (last 3)  No results for input(s): GLUCAP in the last 72 hours.  Wt Readings from Last 3 Encounters:  01/17/15 71.215 kg (157 lb)  01/11/15 71.385 kg (157 lb 6 oz)  11/08/14 72.576 kg (160 lb)    Physical Exam:  Constitutional:  81 year old right-handed female in no distress Eyes: EOM are normal.  Mouth: dentition fair, oral mucosa moist Neck: Normal range of motion. Neck supple. No thyromegaly present.  Cardiovascular: Normal rate and regular rhythm.no murmurs or rubs  Respiratory: Effort normal and breath sounds normal. No respiratory distress. no wheezes GI: Soft. Bowel sounds are normal. She exhibits no distension.  Neurological: She is alert and attentive. She was able to provide her name and place. Follows simple commands despite HOH. Limited insight and awareness. [name] spastic (1+/4) with underlying 1-2/5 strength proximal to distal--apraxia.Marland Kitchen LLE: 1-2 hf, ke and 1 ankle.  inattention to left. DTR's 1+ right and left. no resting tone in legs. No visible spasms or clonus Skin: Skin is warm and dry.  Psychiatric: She has a normal mood and affect   Assessment/Plan: 1. Functional  deficits secondary to right ACA and left thalamic infarcts after recent lumbar decompression which require 3+ hours per day of interdisciplinary therapy in a comprehensive inpatient rehab setting. Physiatrist is providing close team supervision and 24 hour management of active medical problems listed below. Physiatrist and rehab team continue to assess barriers to discharge/monitor patient progress toward functional and medical goals. FIM: FIM - Bathing Bathing Steps Patient Completed: Left Arm, Abdomen, Right upper leg, Left upper leg, Chest Bathing: 3: Mod-Patient completes 5-7 21f 10 parts or 50-74%  FIM - Upper Body Dressing/Undressing Upper body dressing/undressing steps patient completed: Thread/unthread right sleeve of pullover shirt/dresss Upper body dressing/undressing: 2: Max-Patient completed 25-49% of tasks FIM - Lower Body Dressing/Undressing Lower body dressing/undressing: 1: Two helpers  FIM - Toileting Toileting: 1: Two helpers  FIM - Diplomatic Services operational officer Devices: Psychiatrist Transfers: 1-Two helpers, 1-From toilet/BSC: Total A (helper does all/Pt. < 25%), 1-To toilet/BSC: Total A (helper does all/Pt. < 25%)  FIM - Bed/Chair Transfer Bed/Chair Transfer Assistive Devices: Bed rails, HOB elevated, Orthosis Bed/Chair Transfer: 2: Supine > Sit: Max A (lifting assist/Pt. 25-49%), 1: Two helpers  FIM - Locomotion: Wheelchair Distance: 75 Locomotion: Wheelchair: 1: Travels less than 50 ft with maximal assistance (Pt: 25 - 49%) FIM - Locomotion: Ambulation Locomotion: Ambulation Assistive Devices: Other (comment) (R hand rail) Ambulation/Gait Assistance: 1: +1 Total assist Locomotion: Ambulation: 0: Activity did not occur  Comprehension Comprehension Mode: Auditory Comprehension: 5-Set-up assist with hearing device  Expression Expression Mode: Verbal Expression:  5-Expresses basic 90% of the time/requires cueing < 10% of the  time.  Social Interaction Social Interaction: 4-Interacts appropriately 75 - 89% of the time - Needs redirection for appropriate language or to initiate interaction.  Problem Solving Problem Solving: 4-Solves basic 75 - 89% of the time/requires cueing 10 - 24% of the time  Memory Memory Mode: Not assessed Memory: 3-Recognizes or recalls 50 - 74% of the time/requires cueing 25 - 49% of the time  Medical Problem List and Plan: 1. Functional deficits secondary to status post lumbar decompression 01/17/2015 with postoperative right ACA, left thalamic infarcts. Status post loop recorder -continue therapies---slow progress 2. DVT Prophylaxis/Anticoagulation: Subcutaneous heparin indicated given VTE risk. Monitor platelet counts and any signs of bleeding -dopplers   negative 3. Pain Management:    -spasms improved  -flexeril 5mg  q12 prn  -scheduled gabapentin 100mg  at night---?RLS  - tramadol 25-50mg  q6 prn   4. Hypertension. Lopressor 25 mg twice a day. Generally fair control. Still having periodic elevation since CVA. 5. Neuropsych: This patient is not yet capable of making decisions on her own behalf. 6. Skin/Wound Care: Routine skin checks and local care to surgical site. Encourage adequate nutrition 7. Fluids/Electrolytes/Nutrition:  replacing potassium  -potassium recovering  -sodium stable at 130 8. Hyperlipidemia. Pravachol 9. Neurogenic bladder: UA +, ucx pending---begin empiric amoxil  -I/O cath prn   -low dose flomax also--HS  LOS (Days) 5 A FACE TO FACE EVALUATION WAS PERFORMED  Jamicheal Heard T 01/25/2015 8:30 AM

## 2015-01-25 NOTE — Progress Notes (Signed)
Physical Therapy Session Note  Patient Details  Name: Angela Keith MRN: [id number] Date of Birth: [birth year]  Today's Date: 01/25/2015 PT Individual Time:  -  1100-1200 Treatment Session 1: 60 min     Short Term Goals: Week 1:  PT Short Term Goal 1 (Week 1): Pt will transfer supine to edge of bed, edge of bed to supine with max A.  PT Short Term Goal 2 (Week 1): Pt will transfer bed to chair, chair to bed with max A.  PT Short Term Goal 3 (Week 1): Pt will ambulate about 25 feet with LRAD and max A.  PT Short Term Goal 4 (Week 1): Pt will propel w/c about 75 feet with min A.  PT Short Term Goal 5 (Week 1): Pt will ascend/descend 2 stairs with 1 rail and max A.   Skilled Therapeutic Interventions/Progress Updates:    W/C Management: PT instructs pt in w/c propulsion with R hemi technique req min-max A (variable) and PT facilitation of R LE for improved coordination with R UE x 150'. PT applies a semi-rigid back suport inside w/c to reduce slung-ness of w/c backrest and inserts a solid seat into w/c cushion for improving positioning.   Neuromuscular Reeducation: PT instructs pt in standing in // bars req mod-max A x 2, then with mirror visual feedback, bumping R hip towards // bar and R shoulder toward PT Tech's shoulder, to decrease pushing tendency with R UE. This activity was modified to include reaching for beanbags to the R while in // bars, then reaching for beanbags in multi directions with RW in front of her.  PT instructs pt in UE motor control activities utilizing forces use and bilateral arm activities: arm flexion lifting a 1# bar overhead (AAROM for L UE - pt reports pain free), tapping/batting a beach ball with Tech using forced use to faciliate L UE activity (minimal AROM noted), and catching a beachball with B UEs.   Pt appears to have significant motor apraxia in L UE/LE, hemiplegia, increased tone in L arm, impaired posture/sitting/standing balance due to pushing with R arm  and weakness with L arm. Cognitive deficits also limit pt's understanding of new activities. Continue per PT POC.   Therapy Documentation Precautions:  Precautions Precautions: Fall, Back Precaution Comments: daughter brought in lumbar corset from home for comfort, L inattention Restrictions Weight Bearing Restrictions: No Pain: Pain Assessment Pain Assessment: No/denies pain Pain Score: 0-No pain  See FIM for current functional status  Therapy/Group: Individual Therapy and [name], PT Tech as +2  [facility],Taison Celani M 01/25/2015, 8:47 AM

## 2015-01-25 NOTE — Progress Notes (Signed)
Occupational Therapy Session Note  Patient Details  Name: Norva PavlovRachel E Blitzer MRN: [id number] Date of Birth: 10/29/1929  Today's Date: 01/25/2015 OT Individual Time: 1430-1500 OT Individual Time Calculation (min): 30 min    Short Term Goals: Week 1:  OT Short Term Goal 1 (Week 1): Pt will perform UB dressing with mod A in order to decrease level of assist with self care. OT Short Term Goal 2 (Week 1): Pt will perform toileting with assist from 1 person for clothing management and hygiene.  OT Short Term Goal 3 (Week 1): Pt will perform LB dressing with max A or better for standing balance.  OT Short Term Goal 4 (Week 1): Pt will perform grooming with min A in order to increase I with self care.  Skilled Therapeutic Interventions/Progress Updates:    1:1 NMR- focus on basic squat pivot transfers with max A+2 for transfer to the left and mod A +1 to the right mat<>w/c. Once pt was sitting on the mat pt required total A to attend to midline and track to the left. Pt positioning herself to the right sitting EOM with decr awareness of scooting left hip off EOM or able to visual attend to therapist on her left. Focus on maintaining trunk posture at midline and achieving an upright trunk posture with functional reach into the left field with right and left UE with functional activity. Pt required mod A when using Left UE to reach to end range due to apraxia and decr proximal strength. Return to room with sister present.   Therapy Documentation Precautions:  Precautions Precautions: Fall, Back Precaution Comments: daughter brought in lumbar corset from home for comfort, L inattention Restrictions Weight Bearing Restrictions: No General:   Pain: Pain Assessment Pain Assessment: No/denies pain  See FIM for current functional status  Therapy/Group: Individual Therapy  Roney MansSmith, Alec Jaros [facility] 01/25/2015, 3:57 PM

## 2015-01-25 NOTE — Progress Notes (Signed)
Occupational Therapy Session Note  Patient Details  Name: Angela Keith MRN: [id number] Date of Birth: 04-Oct-1929  Today's Date: 01/25/2015 OT Individual Time: 1000-1100 OT Individual Time Calculation (min): 60 min    Short Term Goals: Week 1:  OT Short Term Goal 1 (Week 1): Pt will perform UB dressing with mod A in order to decrease level of assist with self care. OT Short Term Goal 2 (Week 1): Pt will perform toileting with assist from 1 person for clothing management and hygiene.  OT Short Term Goal 3 (Week 1): Pt will perform LB dressing with max A or better for standing balance.  OT Short Term Goal 4 (Week 1): Pt will perform grooming with min A in order to increase I with self care.  Skilled Therapeutic Interventions/Progress Updates:    Pt engaged in BADL retraining including bathing and dressing with sit<>stand from w/c at sink.  Pt required max verbal cues for attention to left and [name].  Pt exhibited increased tone (proximally) in [name] when attempting to engage [name] in functional task. Pt required max verbal cues for utilizing hemi dressing techniques and required assistance to remove shirt after attempting to thread RUE into hole for head after her shirt was pulled over her head.  Pt exhibited significant push/lean to left in sitting and standing.  Pt was able to initiate weight shift to right when requested when standing.  Pt required tot A + 2 (pt=50%) for LB bathing and dressing.  Pt followed one step commands throughout session.  Focus on activity tolerance, sit<>stand, standing balance, attention to left, sitting balance, attention to task, task initiation, increased [name] use, and safety awareness.  Therapy Documentation Precautions:  Precautions Precautions: Fall, Back Precaution Comments: daughter brought in lumbar corset from home for comfort, L inattention Restrictions Weight Bearing Restrictions: No  Pain: Pain Assessment Pain Assessment: No/denies pain Pain Score:  0-No pain  See FIM for current functional status  Therapy/Group: Individual Therapy  Rich BraveLanier, Angela Keith 01/25/2015, 11:04 AM

## 2015-01-25 NOTE — Progress Notes (Signed)
Speech Language Pathology Daily Session Note  Patient Details  Name: Angela PavlovRachel E Keith MRN: [id number] Date of Birth: [birth year]  Today's Date: 01/25/2015 SLP Individual Time: 0800-0900 SLP Individual Time Calculation (min): 60 min  Short Term Goals: Week 1: SLP Short Term Goal 1 (Week 1): Patient will utilize external aids for orientation to time with Mod mutlimodal cues SLP Short Term Goal 2 (Week 1): Patient will attend to left during familiar structured tasks with Mod multimodal cues  SLP Short Term Goal 3 (Week 1): Patient will select attention to basic, familar tasks for 10 minutes with Mod multimodal cues  SLP Short Term Goal 4 (Week 1): Patient will identify 2 physical and 2 cognitive deficits with Mod question cues   Skilled Therapeutic Interventions: Skilled treatment session focused on cognitive goals.  Upon arrival, patient was awake while supine in bed. Patient followed basic 1 step commands with extra time and repetition in regards to donning pants and bed mobility. Patient was transferred to wheelchair with +2 assist for safety. SLP facilitated session by providing extra time for tray set-up. Patient spontaneously attempted to use her [name] as a stabilizer but required assistance for completion of tasks in regards to opening containers.  Patient sustained attention to self-feeding for 40 minutes and attended to left field of environment to locate items on tray with supervision verbal cues.  Patient left upright in wheelchair with quick release belt in place and all needs within reach. Continue with current plan of care.     FIM:  Comprehension Comprehension Mode: Auditory Comprehension: 5-Set-up assist with hearing device Expression Expression Mode: Verbal Expression: 5-Expresses basic 90% of the time/requires cueing < 10% of the time. Social Interaction Social Interaction: 4-Interacts appropriately 75 - 89% of the time - Needs redirection for appropriate language or to initiate  interaction. Problem Solving Problem Solving: 4-Solves basic 75 - 89% of the time/requires cueing 10 - 24% of the time Memory Memory: 3-Recognizes or recalls 50 - 74% of the time/requires cueing 25 - 49% of the time  Pain Pain Assessment Pain Assessment: No/denies pain  Therapy/Group: Individual Therapy  Delaila Nand 01/25/2015, 12:16 PM

## 2015-01-26 ENCOUNTER — Inpatient Hospital Stay (HOSPITAL_COMMUNITY): Payer: Medicare Other

## 2015-01-26 ENCOUNTER — Inpatient Hospital Stay (HOSPITAL_COMMUNITY): Payer: Medicare Other | Admitting: Physical Therapy

## 2015-01-26 ENCOUNTER — Inpatient Hospital Stay (HOSPITAL_COMMUNITY): Payer: Medicare Other | Admitting: Speech Pathology

## 2015-01-26 MED ORDER — GABAPENTIN 100 MG PO CAPS
200.0000 mg | ORAL_CAPSULE | Freq: Every day | ORAL | Status: DC
  Administered 2015-01-26 – 2015-01-27 (×2): 200 mg via ORAL
  Filled 2015-01-26 (×3): qty 2

## 2015-01-26 MED ORDER — CYCLOBENZAPRINE HCL 5 MG PO TABS
5.0000 mg | ORAL_TABLET | Freq: Three times a day (TID) | ORAL | Status: DC | PRN
  Administered 2015-01-26 – 2015-02-02 (×9): 5 mg via ORAL
  Filled 2015-01-26 (×9): qty 1

## 2015-01-26 NOTE — Progress Notes (Signed)
Speech Language Pathology Daily Session Note  Patient Details  Name: Angela Keith MRN: [id number] Date of Birth: [birth year]  Today's Date: 01/26/2015 SLP Individual Time: 0800-0900 SLP Individual Time Calculation (min): 60 min  Short Term Goals: Week 1: SLP Short Term Goal 1 (Week 1): Patient will utilize external aids for orientation to time with Mod mutlimodal cues SLP Short Term Goal 2 (Week 1): Patient will attend to left during familiar structured tasks with Mod multimodal cues  SLP Short Term Goal 3 (Week 1): Patient will select attention to basic, familar tasks for 10 minutes with Mod multimodal cues  SLP Short Term Goal 4 (Week 1): Patient will identify 2 physical and 2 cognitive deficits with Mod question cues   Skilled Therapeutic Interventions: Skilled treatment session focused on cognitive goals.  Upon arrival, patient was awake while supine in bed. Patient was transferred to wheelchair with +2 assist for safety. SLP facilitated session by providing extra time and Mod A verbal cues for tray set-up. Patient also required Mod A verbal cues to utilize and attend to her [name] throughout the meal.  Patient sustained attention to self-feeding for 20 minutes and attended to left field of environment to locate items on tray with Min A verbal cues.  Patient also required Max A multimodal cues for comprehension and problem solving with a 4 step picture sequencing task.  Overall, patient demonstrated decreased verbal expression, a flat affect and increased difficulty following directions, suspect due to fatigue. RN made aware. Patient left upright in wheelchair with quick release belt in place and all needs within reach. Continue with current plan of care.     FIM:  Comprehension Comprehension Mode: Auditory Comprehension: 4-Understands basic 75 - 89% of the time/requires cueing 10 - 24% of the time Expression Expression Mode: Verbal Expression: 4-Expresses basic 75 - 89% of the  time/requires cueing 10 - 24% of the time. Needs helper to occlude trach/needs to repeat words. Social Interaction Social Interaction: 4-Interacts appropriately 75 - 89% of the time - Needs redirection for appropriate language or to initiate interaction. Problem Solving Problem Solving: 2-Solves basic 25 - 49% of the time - needs direction more than half the time to initiate, plan or complete simple activities Memory Memory: 3-Recognizes or recalls 50 - 74% of the time/requires cueing 25 - 49% of the time FIM - Eating Eating Activity: 5: Set-up assist for open containers  Pain Pain Assessment Pain Assessment: No/denies pain  Therapy/Group: Individual Therapy  Charliegh Vasudevan 01/26/2015, 3:45 PM

## 2015-01-26 NOTE — Progress Notes (Signed)
Social Work Patient ID: Angela Keith, female   DOB: [birth year], 81 y.o.   MRN: [id number]   Nigel Sloop, LCSW Social Worker Signed  Patient Care Conference 01/26/2015 11:11 AM    Expand All Collapse All   Inpatient RehabilitationTeam Conference and Plan of Care Update Date: 01/24/2015   Time:  2:00 PM     Patient Name: Angela Keith       Medical Record Number: [id number]  Date of Birth: Apr 26, 1930 Sex: Female         Room/Bed: 4W26C/4W26C-01 Payor Info: Payor: MEDICARE / Plan: MEDICARE PART A AND B / Product Type: *No Product type* /    Admitting Diagnosis: lumbar decomp sullivan   Admit Date/Time:  01/20/2015  4:28 PM Admission Comments: No comment available   Primary Diagnosis:  Cerebral infarction due to thrombosis of cerebral artery Principal Problem: Cerebral infarction due to thrombosis of cerebral artery    Patient Active Problem List     Diagnosis  Date Noted   .  Left hemiparesis     .  Cryptogenic stroke     .  H/O TIA (transient ischemic attack) and stroke     .  Spinal stenosis, lumbar region, with neurogenic claudication  01/17/2015   .  Lumbar stenosis  01/17/2015   .  Hemispheric carotid artery syndrome  11/10/2014   .  Essential hypertension  11/10/2014   .  HLD (hyperlipidemia)  11/10/2014   .  Back pain with left-sided radiculopathy  11/08/2014   .  Cerebral infarction due to thrombosis of cerebral artery     .  Hyperlipidemia LDL goal <70  09/07/2014   .  Presumed CVA (cerebral infarction) s/p IV tPA, MRI negative  09/05/2014   .  Hypertensive urgency  09/05/2014   .  Right bundle branch block  03/21/2014   .  Cough  10/31/2012   .  Nausea vomiting and diarrhea  10/30/2012   .  Elevated lipase  10/30/2012   .  HTN (hypertension)  10/30/2012   .  Unspecified hypothyroidism  10/30/2012     Expected Discharge Date: Expected Discharge Date: 02/14/15  Team Members Present: Physician leading conference: Dr. Faith Rogue Social Worker Present:  Staci Acosta, LCSW Nurse Present: Chana Bode, RN PT Present: Karolee Stamps, Nita Sickle, PT OT Present: Roney Mans, OT;Other (comment) Johnsie Cancel, OT) SLP Present: Feliberto Gottron, SLP PPS Coordinator present : Tora Duck, RN, CRRN        Current Status/Progress  Goal  Weekly Team Focus   Medical     right MCA infarct after lumbar sugery. night time spasms. ?RLS  improve activity tolerance   urine retention, pain control, brace fit   Bowel/Bladder     Incontinent of bowel and bladder. I&O cath q6-8 hr., LBM7/21  Mod assist with bowel and bladder      Swallow/Nutrition/ Hydration               ADL's     total A +2 for all ADL and transfers   min A overall   neuro re-education, trunk control, initiaton, visual attention to left, transfers, sit to stands   Mobility     total A +2  min A overall w/c level and short distance gait  neuro re-ed for trunk control, balance, reorientation to midline, transfers, w/c mobility, sit to stands, standing balance, endurance, strengthening   Communication               Safety/Cognition/  Behavioral Observations    Mod-Max A  Min A  sustained attention, recall, attention to left, problem solving, awareness    Pain               Skin                Rehab Goals Patient on target to meet rehab goals: Yes Rehab Goals Revised: none - first conference *See Care Plan and progress notes for long and short-term goals.    Barriers to Discharge:  continued NMR, incontinence, back precautions     Possible Resolutions to Barriers:   ongoing techinque training, care giver ed     Discharge Planning/Teaching Needs:   Pt wants to return to her home at d/c, but she has min assist level goals.  Pt's son/dtr-in-law have offered to have pt in their home.  Pt will need to decide if she plans to go home with care at home (family/paid caregivers) or go to son/dtr-in-law's home.  Family will come for family training closer to d/c.  They visit daily.     Team Discussion:    Pt with recent back surgery and then post-op stroke.  She has left sided weakness and possibly apraxia.  Pt with urinary retention, requiring in and out caths.  She has also been experiencing right leg spasms since admission.  MD is working on changing medications to control these.   Pt has good family support.  Pt fatigues quickly during therapies.  She needs a new back brace and [employer] has been consulted.  Pt's goals are min assist overall from w/c level with 50' min assist gait goal.   Revisions to Treatment Plan:    none    Continued Need for Acute Rehabilitation Level of Care: The patient requires daily medical management by a physician with specialized training in physical medicine and rehabilitation for the following conditions: Daily direction of a multidisciplinary physical rehabilitation program to ensure safe treatment while eliciting the highest outcome that is of practical value to the patient.: Yes Daily medical management of patient stability for increased activity during participation in an intensive rehabilitation regime.: Yes Daily analysis of laboratory values and/or radiology reports with any subsequent need for medication adjustment of medical intervention for : Post surgical problems;Neurological problems  Remy Dia, Vista Deck 01/26/2015, 12:01 PM

## 2015-01-26 NOTE — Progress Notes (Signed)
Social Work Patient ID: Angela Keith, female   DOB: 11/01/1929, 81 y.o.   MRN: [id number]   CSW met with pt and her dtr-in-law and sister to update them on team conference discussion.  CSW gave pt the targeted d/c date and reviewed with her that she has min assist level goals and would likely use the w/c mainly at home with short ambulation (50') with min assist.  Explained to pt that this means pt will need to have someone with her 24/7 who can provide minimal hands on assistance.  Pt expressed understanding, but did not state what her d/c plan would be.  Pt knows she needs to be thinking of this.  Son and dtr-in-law have offered their home.  CSW will continue to follow and offer support as pt determines her d/c plan.  

## 2015-01-26 NOTE — Patient Care Conference (Signed)
Inpatient RehabilitationTeam Conference and Plan of Care Update Date: 01/24/2015   Time:  2:00 PM    Patient Name: Angela Keith      Medical Record Number: [id number]  Date of Birth: 1929-07-17 Sex: Female         Room/Bed: 4W26C/4W26C-01 Payor Info: Payor: MEDICARE / Plan: MEDICARE PART A AND B / Product Type: *No Product type* /    Admitting Diagnosis: lumbar decomp sullivan  Admit Date/Time:  01/20/2015  4:28 PM Admission Comments: No comment available   Primary Diagnosis:  Cerebral infarction due to thrombosis of cerebral artery Principal Problem: Cerebral infarction due to thrombosis of cerebral artery  Patient Active Problem List   Diagnosis Date Noted  . Left hemiparesis   . Cryptogenic stroke   . H/O TIA (transient ischemic attack) and stroke   . Spinal stenosis, lumbar region, with neurogenic claudication 01/17/2015  . Lumbar stenosis 01/17/2015  . 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 Essential hypertension 11/10/2014  . HLD (hyperlipidemia) 11/10/2014  . Back pain with left-sided radiculopathy 11/08/2014  . Cerebral infarction due to thrombosis of cerebral artery   . Hyperlipidemia LDL goal <70 09/07/2014  . Presumed CVA (cerebral infarction) s/p IV tPA, MRI negative 09/05/2014  . Hypertensive urgency 09/05/2014  . Right bundle branch block 03/21/2014  . Cough 10/31/2012  . Nausea vomiting and diarrhea 10/30/2012  . Elevated lipase 10/30/2012  . HTN (hypertension) 10/30/2012  . Unspecified hypothyroidism 10/30/2012    Expected Discharge Date: Expected Discharge Date: 02/14/15  Team Members Present: Physician leading conference: Dr. Faith Rogue Social Worker Present: Staci Acosta, LCSW Nurse Present: Chana Bode, RN PT Present: Karolee Stamps, Nita Sickle, PT OT Present: Roney Mans, OT;Other (comment) Johnsie Cancel, OT) SLP Present: Feliberto Gottron, SLP PPS Coordinator present : Tora Duck, RN, CRRN     Current Status/Progress Goal  Weekly Team Focus  Medical   right MCA infarct after lumbar sugery. night time spasms. ?RLS  improve activity tolerance   urine retention, pain control, brace fit   Bowel/Bladder   Incontinent of bowel and bladder. I&O cath q6-8 hr., LBM7/21  Mod assist with bowel and bladder      Swallow/Nutrition/ Hydration             ADL's   total A +2 for all ADL and transfers  min A overall   neuro re-education, trunk control, initiaton, visual attention to left, transfers, sit to stands   Mobility   total A +2  min A overall w/c level and short distance gait  neuro re-ed for trunk control, balance, reorientation to midline, transfers, w/c mobility, sit to stands, standing balance, endurance, strengthening   Communication             Safety/Cognition/ Behavioral Observations  Mod-Max A  Min A  sustained attention, recall, attention to left, problem solving, awareness    Pain             Skin              Rehab Goals Patient on target to meet rehab goals: Yes Rehab Goals Revised: none - first conference *See Care Plan and progress notes for long and short-term goals.  Barriers to Discharge: continued NMR, incontinence, back precautions    Possible Resolutions to Barriers:  ongoing techinque training, care giver ed    Discharge Planning/Teaching Needs:  Pt wants to return to her home at d/c, but she has min assist level goals.  Pt's son/dtr-in-law have offered to  have pt in their home.  Pt will need to decide if she plans to go home with care at home (family/paid caregivers) or go to son/dtr-in-law's home.  Family will come for family training closer to d/c.  They visit daily.   Team Discussion:  Pt with recent back surgery and then post-op stroke.  She has left sided weakness and possibly apraxia.  Pt with urinary retention, requiring in and out caths.  She has also been experiencing right leg spasms since admission.  MD is working on changing medications to control these.   Pt has  good family support.  Pt fatigues quickly during therapies.  She needs a new back brace and [employer] has been consulted.  Pt's goals are min assist overall from w/c level with 50' min assist gait goal.  Revisions to Treatment Plan:  none   Continued Need for Acute Rehabilitation Level of Care: The patient requires daily medical management by a physician with specialized training in physical medicine and rehabilitation for the following conditions: Daily direction of a multidisciplinary physical rehabilitation program to ensure safe treatment while eliciting the highest outcome that is of practical value to the patient.: Yes Daily medical management of patient stability for increased activity during participation in an intensive rehabilitation regime.: Yes Daily analysis of laboratory values and/or radiology reports with any subsequent need for medication adjustment of medical intervention for : Post surgical problems;Neurological problems  Johnika Escareno, Vista Deck 01/26/2015, 12:01 PM

## 2015-01-26 NOTE — Progress Notes (Signed)
Occupational Therapy Session Note  Patient Details  Name: Angela Keith MRN: [id number] Date of Birth: 23-Jun-1930  Today's Date: 01/26/2015 OT Individual Time: 1100-1200 OT Individual Time Calculation (min): 60 min    Short Term Goals: Week 1:  OT Short Term Goal 1 (Week 1): Pt will perform UB dressing with mod A in order to decrease level of assist with self care. OT Short Term Goal 2 (Week 1): Pt will perform toileting with assist from 1 person for clothing management and hygiene.  OT Short Term Goal 3 (Week 1): Pt will perform LB dressing with max A or better for standing balance.  OT Short Term Goal 4 (Week 1): Pt will perform grooming with min A in order to increase I with self care.  Skilled Therapeutic Interventions/Progress Updates: ADL-retraining with focus on improved static and dynamic sitting balance, adapted dressing skills, weigth-shifting, and seated grooming.   Pt received seated in w/c with rehab tech present.  Per tech report, pt had just received assist with lower body cleaning and dressing after BM accident.   Pt receptive for seated upper body bathing/dressing at edge of bed with +2 assist to maintain sitting posture.   With sister present to provide clothing, pt performed upper body undressing with overall total assist to remove shirt while maintaining sitting posture with max vc for postural correction and manual facilitation to perform right lateral lean onto elbow, intermittently, to normalize tone.   Pt bathed chest, left arm and washed her face with good thoroughness and requested assist with applying lotion to her back and right arm.   Pt donned shirt with max assist although lacing right arm through and lifting both arms when cued.   OT applied TEDs and encouraged pt to don shoes with legs crossed over which she attempted with facilitation to lift leg and hold in position as pt placed shoe on her foot.   Pt completed max assist squat pivot transfer to w/c and groomed,  combing her hair while seated in w/c at sink.   Pt combed 70% of her hair and her sister finished the left side for her.   Pt's sister and family were educated on benefit to pt of approaching her and conversing with her on her left side to reduce left side neglect evident during eval.   Pt left in w/c with her sister present to attend to all needs.       Therapy Documentation Precautions:  Precautions Precautions: Fall, Back Precaution Comments: daughter brought in lumbar corset from home for comfort, L inattention Restrictions Weight Bearing Restrictions: No  Pain: No/denies pain    See FIM for current functional status  Therapy/Group: Individual Therapy   Second session: Time: 1400-1430 Time Calculation (min):  30 min  Pain Assessment: No/denies pain  Skilled Therapeutic Interventions:  Therapeutic activity with focus on family education (daughter-in-law and sister present), dynamic sitting balance, static standing, and SPT toward left.    Pt instructed on bilateral activity with hands laced together, to facilitate improved symmetry while static sitting.   Pt completes reaching tasks to her right to facilitate weigh-shifting.   Pt able to perform sit>stand with mod assist to stand (pt=50%) and practiced standing weight-shifting from right leg to left leg with max facilitation to lift and place left leg toward w/c.   Pt completes SPT transfer to w/c with mod assist to lower from stand to sitting (pt=60%).    Family educated on benefit of left half-lap tray to improve postural  alignment while pt remains seated in w/c.    Pt left with family at end of session.   See FIM for current functional status  Therapy/Group: Individual Therapy  Angela Keith 01/26/2015, 3:33 PM

## 2015-01-26 NOTE — Plan of Care (Signed)
Problem: RH BOWEL ELIMINATION Goal: RH STG MANAGE BOWEL WITH ASSISTANCE STG Manage Bowel with mod Assistance.  Outcome: Not Progressing LBM 01/21/2015. Sorbitol given with no results  Goal: RH STG MANAGE BOWEL W/MEDICATION W/ASSISTANCE STG Manage Bowel with Medication with mod Assistance.  Outcome: Not Progressing No results with sorbitol

## 2015-01-26 NOTE — Progress Notes (Signed)
Physical Therapy Session Note  Patient Details  Name: Angela Keith MRN: [id number] Date of Birth: Mar 17, 1930  Today's Date: 01/26/2015 PT Individual Time:  -  1000-1100 Treatment Time: 60 min     Short Term Goals: Week 1:  PT Short Term Goal 1 (Week 1): Pt will transfer supine to edge of bed, edge of bed to supine with max A.  PT Short Term Goal 2 (Week 1): Pt will transfer bed to chair, chair to bed with max A.  PT Short Term Goal 3 (Week 1): Pt will ambulate about 25 feet with LRAD and max A.  PT Short Term Goal 4 (Week 1): Pt will propel w/c about 75 feet with min A.  PT Short Term Goal 5 (Week 1): Pt will ascend/descend 2 stairs with 1 rail and max A.   Skilled Therapeutic Interventions/Progress Updates:    Therapeutic Activity: Pt received up in w/c - PT smelled BM. PT completes max A squat-pivot transfer w/c to bed to pt's L, max A sit to side lie to supine, min A to log roll to pt's R, mod A to logroll to pt's L, while PT completes bowel hygiene and changes pt's brief. PT instructs pt in L side lie to sit transfer req max A, sitting balance mod-max A with pt leaning L and back, and PT and Tech assist pt in dressing upper body and lower body edge of bed req +2 assist.   W/C Management: PT instructs pt in w/c propulsion with R UE and R LE with hand-over-hand to R LE to treat motor apraxia x 300' req min-max A due to pt impaired attention to task, motor apraxia, and discoordination of using R arm and R leg to steer.  PT exchanges pt's hemi-height w/c for one with a solid back, solid seat insert, and gel/foam combination for improved postural support and positioning and pt demonstrates significantly improved upright posture.   Pt significantly req a solid backrest and solid seat insert for w/c positioning for posture due to recent lumbar surgery and appropriate positioning in midline. New w/c needs to be hemi-d in order to enable pt to practice self propulsion with R arm/R leg. Continue  per PT POC.   Therapy Documentation Precautions:  Precautions Precautions: Fall, Back Precaution Comments: daughter brought in lumbar corset from home for comfort, L inattention Restrictions Weight Bearing Restrictions: No Pain: Pain Assessment Pain Assessment: 0-10 Pain Score: 5  Pain Type: Surgical pain Pain Location: Back Pain Orientation: Lower Pain Descriptors / Indicators: Sore Pain Onset: On-going Pain Intervention(s): Repositioned Multiple Pain Sites: No   See FIM for current functional status  Therapy/Group: Individual Therapy with French Ana, PT Tech as +2  [facility] M 01/26/2015, 8:43 AM

## 2015-01-26 NOTE — Progress Notes (Signed)
[place] PHYSICAL MEDICINE & REHABILITATION     PROGRESS NOTE    Subjective/Complaints: Night nurse states patient slept. A lot of "activity" around room earlier in evening, family with numerous questions, concerns.   ROS: Pt denies fever, rash/itching, headache, blurred or double vision,   vomiting, abdominal pain, diarrhea, chest pain, shortness of breath, palpitations, dysuria, dizziness, neck or back pain, bleeding, anxiety, or depression   Objective: Vital Signs: Blood pressure 124/84, pulse 64, temperature 97.7 F (36.5 C), temperature source Oral, resp. rate 16, SpO2 95 %. No results found. No results for input(s): WBC, HGB, HCT, PLT in the last 72 hours.  Recent Labs  01/24/15 0428  NA 130*  K 4.2  CL 98*  GLUCOSE 92  BUN 11  CREATININE 0.82  CALCIUM 8.9   CBG (last 3)  No results for input(s): GLUCAP in the last 72 hours.  Wt Readings from Last 3 Encounters:  01/17/15 71.215 kg (157 lb)  01/11/15 71.385 kg (157 lb 6 oz)  11/08/14 72.576 kg (160 lb)    Physical Exam:  Constitutional:  81 year old right-handed female in no distress Eyes: EOM are normal.  Mouth: dentition fair, oral mucosa moist Neck: Normal range of motion. Neck supple. No thyromegaly present.  Cardiovascular: Normal rate and regular rhythm.no murmurs or rubs  Respiratory: Effort normal and breath sounds normal. No respiratory distress. no wheezes GI: Soft. Bowel sounds are normal. She exhibits no distension.  Neurological: She is alert and attentive.  Follows simple commands with visual cues, despite HOH. Limited insight and awareness. [name] spastic (1+/4) with underlying 1-2/5 strength proximal to distal--apraxia.Marland Kitchen LLE: 1-2 hf, ke and 1 ankle.  inattention to left. DTR's 1+ right and left. no resting tone in legs. No visible spasms or clonus Skin: Skin is warm and dry.  Psychiatric: She has a normal mood and affect   Assessment/Plan: 1. Functional deficits secondary to right ACA  and left thalamic infarcts after recent lumbar decompression which require 3+ hours per day of interdisciplinary therapy in a comprehensive inpatient rehab setting. Physiatrist is providing close team supervision and 24 hour management of active medical problems listed below. Physiatrist and rehab team continue to assess barriers to discharge/monitor patient progress toward functional and medical goals. FIM: FIM - Bathing Bathing Steps Patient Completed: Left Arm, Chest, Abdomen, Right upper leg Bathing: 1: Two helpers  FIM - Upper Body Dressing/Undressing Upper body dressing/undressing steps patient completed: Thread/unthread right sleeve of pullover shirt/dresss, Put head through opening of pull over shirt/dress Upper body dressing/undressing: 3: Mod-Patient completed 50-74% of tasks FIM - Lower Body Dressing/Undressing Lower body dressing/undressing: 1: Two helpers  FIM - Toileting Toileting: 1: Two helpers  FIM - Diplomatic Services operational officer Devices: Psychiatrist Transfers: 1-Two helpers, 1-From toilet/BSC: Total A (helper does all/Pt. < 25%), 1-To toilet/BSC: Total A (helper does all/Pt. < 25%)  FIM - Bed/Chair Transfer Bed/Chair Transfer Assistive Devices: Bed rails, HOB elevated, Orthosis Bed/Chair Transfer: 1: Two helpers  FIM - Locomotion: Wheelchair Distance: 100 Locomotion: Wheelchair: 2: Travels 50 - 149 ft with maximal assistance (Pt: 25 - 49%) FIM - Locomotion: Ambulation Locomotion: Ambulation Assistive Devices: Other (comment) (R hand rail) Ambulation/Gait Assistance: 1: +1 Total assist Locomotion: Ambulation: 0: Activity did not occur  Comprehension Comprehension Mode: Auditory Comprehension: 4-Understands basic 75 - 89% of the time/requires cueing 10 - 24% of the time  Expression Expression Mode: Verbal Expression: 4-Expresses basic 75 - 89% of the time/requires cueing 10 - 24% of the time.  Needs helper to occlude trach/needs to repeat  words.  Social Interaction Social Interaction: 4-Interacts appropriately 75 - 89% of the time - Needs redirection for appropriate language or to initiate interaction.  Problem Solving Problem Solving: 4-Solves basic 75 - 89% of the time/requires cueing 10 - 24% of the time  Memory Memory Mode: Not assessed Memory: 3-Recognizes or recalls 50 - 74% of the time/requires cueing 25 - 49% of the time  Medical Problem List and Plan: 1. Functional deficits secondary to status post lumbar decompression 01/17/2015 with postoperative right ACA, left thalamic infarcts. Status post loop recorder -continue therapies---slow progress 2. DVT Prophylaxis/Anticoagulation: Subcutaneous heparin indicated given VTE risk. Monitor platelet counts and any signs of bleeding -dopplers   negative 3. Pain Management:    -spasms improved  -flexeril 5mg -increase to q8 prn  -scheduled gabapentin 200mg  at night. Adjusted time to 2100---?RLS  - tramadol 25-50mg  q6 prn  -family not helping matters quite honestly   4. Hypertension. Lopressor 25 mg twice a day. Generally fair control. Still having periodic elevation since CVA. 5. Neuropsych: This patient is not yet capable of making decisions on her own behalf. 6. Skin/Wound Care: Routine skin checks and local care to surgical site. Encourage adequate nutrition 7. Fluids/Electrolytes/Nutrition:  replacing potassium  -potassium recovering  -sodium stable at 130 8. Hyperlipidemia. Pravachol 9. Neurogenic bladder: UA +, ucx still pending---began empiric amoxil  -I/O cath prn   -low dose flomax also--HS  LOS (Days) 6 A FACE TO FACE EVALUATION WAS PERFORMED  Philana Younis T 01/26/2015 9:28 AM

## 2015-01-27 ENCOUNTER — Inpatient Hospital Stay (HOSPITAL_COMMUNITY): Payer: Medicare Other | Admitting: Speech Pathology

## 2015-01-27 ENCOUNTER — Inpatient Hospital Stay (HOSPITAL_COMMUNITY): Payer: Medicare Other | Admitting: Physical Therapy

## 2015-01-27 ENCOUNTER — Inpatient Hospital Stay (HOSPITAL_COMMUNITY): Payer: Medicare Other

## 2015-01-27 LAB — URINE CULTURE: Culture: >=100000

## 2015-01-27 MED ORDER — TRAZODONE HCL 50 MG PO TABS
25.0000 mg | ORAL_TABLET | Freq: Once | ORAL | Status: AC
  Administered 2015-01-27: 25 mg via ORAL
  Filled 2015-01-27: qty 1

## 2015-01-27 NOTE — Progress Notes (Signed)
Physical Therapy Weekly Progress Note  Patient Details  Name: Angela Keith MRN: [id number] Date of Birth: 1929-11-17  Beginning of progress report period: January 20, 2015 End of progress report period: January 27, 2015  Today's Date: 01/27/2015 PT Individual Time:  -  1300-1430 Treatment Time: 90 minutes     Patient has met 2 of 5 short term goals.  Pt is slowly improving, tolerating max A of 1 person for transfers and bed mobility, but continues to req +2 assist for gait. Pt's limiting factors include impaired cognition, limiting her following of commands and new learning, pushing syndrome using R UE (primarily) to push towards hemiplegic L side, low activity tolerance, and poor posture. The use of a solid seat insert with solid backrest in a w/c has improved pt's sitting posture, compared to a sling seat/backrest. PT anticipates pt will continue to make progress with additional IPR.   Patient continues to demonstrate the following deficits: impaired activity tolerance, poor posture leaning L and forward and sacral sitting, difficulty with all aspects of functional mobility - w/c propulsion, transfers, bed mobility, sit to stands, and ambulation, pushing syndrome with R UE, hemiplegic L side, and motor apraxia and therefore will continue to benefit from skilled PT intervention to enhance overall performance with activity tolerance, balance, postural control, ability to compensate for deficits, functional use of  left upper extremity and left lower extremity, attention, awareness and coordination.  Patient progressing toward long term goals..  Continue plan of care.  PT Short Term Goals Week 1:  PT Short Term Goal 1 (Week 1): Pt will transfer supine to edge of bed, edge of bed to supine with max A.  PT Short Term Goal 1 - Progress (Week 1): Met PT Short Term Goal 2 (Week 1): Pt will transfer bed to chair, chair to bed with max A.  PT Short Term Goal 2 - Progress (Week 1): Met PT Short Term Goal  3 (Week 1): Pt will ambulate about 25 feet with LRAD and max A.  PT Short Term Goal 3 - Progress (Week 1): Progressing toward goal PT Short Term Goal 4 (Week 1): Pt will propel w/c about 75 feet with min A.  PT Short Term Goal 4 - Progress (Week 1): Progressing toward goal PT Short Term Goal 5 (Week 1): Pt will ascend/descend 2 stairs with 1 rail and max A.  PT Short Term Goal 5 - Progress (Week 1): Progressing toward goal Week 2:  PT Short Term Goal 1 (Week 2): Pt will ambulate about 25 feet with LRAD and max A.  PT Short Term Goal 2 (Week 2): Pt will propel w/c about 75 feet with min A.  PT Short Term Goal 3 (Week 2): Pt will ascend/descend 2 stairs with 1 rail and max A.  PT Short Term Goal 4 (Week 2): Pt will consistently sit to stand req mod A.  PT Short Term Goal 5 (Week 2): Pt will demonstrate static sit balance x 30 seconds with min A.   Skilled Therapeutic Interventions/Progress Updates:    Therapeutic Activity: Pt received in bed with grandaughter initially present, but she left upon initiation of PT session. PT instructs pt in log rolling R in bed req mod A and min A to logroll L, mod-max A R side lie to/from sit transfer, max A L side lie to/from sit transfer in bed. PT instructs pt in squat-pivot transfer w/c to/from bed req mod-max A x 3 reps total (each direction).   Gait  Training: PT instructs pt in ambulation with R wall rail req max A from PT (progression of L LE and blocking L knee from buckling/hyperextending in stance) with verbal cues for sequencing and +2 for w/c follow for safety.  PT instructs pt in ascending/descending a single step with R handrail req max A (placement of L leg on/off step) and simple, one step verbal cues. PT felt unsafe to do more than one step.   W/C Management: PT instructs pt in w/c propulsion with R UE/LE x 50' x 2 reps req min-mod A with occasional SBA and repeated verbal cues for technique. PT instructs pt in simple, one step commands to lock  each brake with each hand.   Neuromuscular Reeducation: PT instructs pt in standing dynamic balance reaching activity - first to R with R UE to encourage weight shift to the R and reduction in pushing with R side to the L - then with L UE to the L to encourage weight-bearing through L LE and functional use of L arm. Pt req mod A for dynamic stand balance activity.   Pt is progressing with functional mobility and making good progress, but continues to be heavily impaired. Pt will likely benefit from additional IPR PT. Continue per PT POC.   Therapy Documentation Precautions:  Precautions Precautions: Fall, Back Precaution Comments: daughter brought in lumbar corset from home for comfort, L inattention Restrictions Weight Bearing Restrictions: No Pain: Pain Assessment Pain Assessment: No/denies pain  See FIM for current functional status  Therapy/Group: Individual Therapy with Dannielle Huh, PT Tech, as +2  [facility] M 01/27/2015, 8:49 AM

## 2015-01-27 NOTE — Progress Notes (Signed)
[place] PHYSICAL MEDICINE & REHABILITATION     PROGRESS NOTE    Subjective/Complaints: Much better night by all accounts. Slept well. Denies spasms  ROS: Pt denies fever, rash/itching, headache, blurred or double vision,   vomiting, abdominal pain, diarrhea, chest pain, shortness of breath, palpitations, dysuria, dizziness, neck or back pain, bleeding, anxiety, or depression   Objective: Vital Signs: Blood pressure 121/57, pulse 60, temperature 98.3 F (36.8 C), temperature source Oral, resp. rate 18, SpO2 95 %. No results found. No results for input(s): WBC, HGB, HCT, PLT in the last 72 hours. No results for input(s): NA, K, CL, GLUCOSE, BUN, CREATININE, CALCIUM in the last 72 hours.  Invalid input(s): CO CBG (last 3)  No results for input(s): GLUCAP in the last 72 hours.  Wt Readings from Last 3 Encounters:  01/17/15 71.215 kg (157 lb)  01/11/15 71.385 kg (157 lb 6 oz)  11/08/14 72.576 kg (160 lb)    Physical Exam:  Constitutional:  81 year old right-handed female in no distress Eyes: EOM are normal.  Mouth: dentition fair, oral mucosa moist Neck: Normal range of motion. Neck supple. No thyromegaly present.  Cardiovascular: Normal rate and regular rhythm.no murmurs or rubs  Respiratory: Effort normal and breath sounds normal. No respiratory distress. no wheezes GI: Soft. Bowel sounds are normal. She exhibits no distension.  Neurological: She is alert and attentive.  Follows simple commands with visual cues, despite HOH. Limited insight and awareness. [name] spastic (1+/4) with underlying 1-2/5 strength proximal to distal--apraxia.Marland Kitchen LLE:  Tone 2/4 today hamstrings and calf. MMT: 1-2 hf, ke and 1 ankle.  inattention to left. DTR's 1+ right and left. No visible spasms or clonus Skin: Skin is warm and dry.  Psychiatric: She has a normal mood and affect   Assessment/Plan: 1. Functional deficits secondary to right ACA and left thalamic infarcts after recent lumbar  decompression which require 3+ hours per day of interdisciplinary therapy in a comprehensive inpatient rehab setting. Physiatrist is providing close team supervision and 24 hour management of active medical problems listed below. Physiatrist and rehab team continue to assess barriers to discharge/monitor patient progress toward functional and medical goals. FIM: FIM - Bathing Bathing Steps Patient Completed: Left Arm, Chest, Abdomen, Right upper leg Bathing: 1: Two helpers  FIM - Upper Body Dressing/Undressing Upper body dressing/undressing steps patient completed: Thread/unthread right sleeve of pullover shirt/dresss, Put head through opening of pull over shirt/dress Upper body dressing/undressing: 3: Mod-Patient completed 50-74% of tasks FIM - Lower Body Dressing/Undressing Lower body dressing/undressing: 1: Two helpers  FIM - Toileting Toileting: 1: Two helpers  FIM - Diplomatic Services operational officer Devices: Psychiatrist Transfers: 1-Two helpers, 1-From toilet/BSC: Total A (helper does all/Pt. < 25%), 1-To toilet/BSC: Total A (helper does all/Pt. < 25%)  FIM - Bed/Chair Transfer Bed/Chair Transfer Assistive Devices: Arm rests Bed/Chair Transfer: 2: Supine > Sit: Max A (lifting assist/Pt. 25-49%), 2: Sit > Supine: Max A (lifting assist/Pt. 25-49%), 2: Bed > Chair or W/C: Max A (lift and lower assist), 2: Chair or W/C > Bed: Max A (lift and lower assist)  FIM - Locomotion: Wheelchair Distance: 300 Locomotion: Wheelchair: 2: Travels 150 ft or more: maneuvers on rugs and over door sills with maximal assistance (Pt: 25 - 49%) FIM - Locomotion: Ambulation Locomotion: Ambulation Assistive Devices: Other (comment) (R hand rail) Ambulation/Gait Assistance: 1: +1 Total assist Locomotion: Ambulation: 0: Activity did not occur  Comprehension Comprehension Mode: Auditory Comprehension: 4-Understands basic 75 - 89% of the time/requires cueing  10 - 24% of the  time  Expression Expression Mode: Verbal Expression: 4-Expresses basic 75 - 89% of the time/requires cueing 10 - 24% of the time. Needs helper to occlude trach/needs to repeat words.  Social Interaction Social Interaction: 4-Interacts appropriately 75 - 89% of the time - Needs redirection for appropriate language or to initiate interaction.  Problem Solving Problem Solving: 2-Solves basic 25 - 49% of the time - needs direction more than half the time to initiate, plan or complete simple activities  Memory Memory Mode: Not assessed Memory: 3-Recognizes or recalls 50 - 74% of the time/requires cueing 25 - 49% of the time  Medical Problem List and Plan: 1. Functional deficits secondary to status post lumbar decompression 01/17/2015 with postoperative right ACA, left thalamic infarcts. Status post loop recorder -continue therapies---slow progress 2. DVT Prophylaxis/Anticoagulation: Subcutaneous heparin indicated given VTE risk. Monitor platelet counts and any signs of bleeding -dopplers   negative 3. Pain Management:    -tone seems increased LLE  -continue flexeril  q8 prn  -continued gabapentin  at 2100 for ?RLS  - tramadol 25-50mg  q6 prn  -family not helping matters quite honestly   -consider resuming low dose baclofen on scheduled basis   4. Hypertension. Lopressor 25 mg twice a day. Generally fair control. Still having periodic elevation since CVA. 5. Neuropsych: This patient is not yet capable of making decisions on her own behalf. 6. Skin/Wound Care: Routine skin checks and local care to surgical site. Encourage adequate nutrition 7. Fluids/Electrolytes/Nutrition:  replacing potassium  -potassium recovering  -sodium stable at 130 8. Hyperlipidemia. Pravachol 9. Neurogenic bladder:  empiric amoxil empirically for 100k e coli---sens pending  -I/O cath prn   -low dose flomax also--HS  LOS (Days) 7 A FACE TO FACE EVALUATION WAS PERFORMED  SWARTZ,ZACHARY T 01/27/2015  9:06 AM

## 2015-01-27 NOTE — Progress Notes (Signed)
Occupational Therapy Session Note  Patient Details  Name: Angela Keith MRN: [id number] Date of Birth: [birth year]  Today's Date: 01/27/2015 OT Individual Time: 0900-1000 OT Individual Time Calculation (min): 60 min   Short Term Goals: Week 1:  OT Short Term Goal 1 (Week 1): Pt will perform UB dressing with mod A in order to decrease level of assist with self care. OT Short Term Goal 2 (Week 1): Pt will perform toileting with assist from 1 person for clothing management and hygiene.  OT Short Term Goal 3 (Week 1): Pt will perform LB dressing with max A or better for standing balance.  OT Short Term Goal 4 (Week 1): Pt will perform grooming with min A in order to increase I with self care.  Skilled Therapeutic Interventions/Progress Updates: ADL-retraining with focus on improved static/dynamic sitting balance, NMR of L-UL/E, attention, sequencing, safety awareness, toileting, transfers, and adapted bathing skills.   Pt received seated in w/c and receptive for shower level BADL session.   Pt was aware of need to toilet and requested assist with transfer.   Pt completed transfer from w/c to toilet with max assist (pt=40%) and mod assist to maintain sitting balance d/t to left lateral lean.  Pt improves sitting posture with manual facilitation to place right arm on rail and to cue to pull (weight shifting) however pt is unable to detect migration of her leans w/o cues.   Pt toileted passing only urine and gas after max assist to remove brief.      Pt returned to w/c performing stand pivot transfer and from w/c to tub bench similarly with max assist to position left foot, weight-shift and turn.   After setup to remove spinal brace, pt bathed seated with left side at corner of shower stall and mod assist to reposition from left leans, intermittently as pt used right hand to wash chest, stomach, peri-area, upper legs and lower legs with mod assist to place legs over opposite knee.   OT provided assist to  wash pt's buttocks and back using lateral leans.   Pt returned to w/c completing squat pivot transfer with max assist and dressed in w/c placed near edge of bed.   Pt donned shirt with mod assist to lace left arm and pull shirt over her back.   Pt  donned briefs, pants, and shoes with max assist after setup to don TEDs.  Pt stood supported using bed rail and bed raised with mod assist to maintain static standing balance as therapist pulled up briefs and pants.   Pt left in w/c at end of session with QRB attached and left half-lap tray installed to improve sitting posture.     Therapy Documentation Precautions:  Precautions Precautions: Fall, Back Precaution Comments: daughter brought in lumbar corset from home for comfort, L inattention Restrictions Weight Bearing Restrictions: No  Pain: No/denies pain    See FIM for current functional status  Therapy/Group: Individual Therapy  Bayne Fosnaugh 01/27/2015, 11:56 AM

## 2015-01-27 NOTE — Progress Notes (Signed)
Speech Language Pathology Weekly Progress and Session Note  Patient Details  Name: Angela Keith MRN: [id number] Date of Birth: [birth year]  Beginning of progress report period: December 21, 2014 End of progress report period: December 28, 2014  Today's Date: 01/27/2015 SLP Individual Time: 0800-0900 SLP Individual Time Calculation (min): 60 min  Short Term Goals: Week 1: SLP Short Term Goal 1 (Week 1): Patient will utilize external aids for orientation to time with Mod mutlimodal cues SLP Short Term Goal 1 - Progress (Week 1): Met SLP Short Term Goal 2 (Week 1): Patient will attend to left during familiar structured tasks with Mod multimodal cues  SLP Short Term Goal 2 - Progress (Week 1): Met SLP Short Term Goal 3 (Week 1): Patient will select attention to basic, familar tasks for 10 minutes with Mod multimodal cues  SLP Short Term Goal 3 - Progress (Week 1): Met SLP Short Term Goal 4 (Week 1): Patient will identify 2 physical and 2 cognitive deficits with Mod question cues  SLP Short Term Goal 4 - Progress (Week 1): Met    New Short Term Goals: Week 2: SLP Short Term Goal 1 (Week 2): Patient will attend to left field of enviornment and body during familiar structured tasks with Min multimodal cues  SLP Short Term Goal 2 (Week 2): Patient will select attention to basic, familar tasks for 45 minutes with Min multimodal cues  SLP Short Term Goal 3 (Week 2): Patient will utilize call bell to request assistance in 50% of observable opportunities SLP Short Term Goal 4 (Week 2): Patient will recall new, daily information with Min A multimodal cues for use of external aids.  SLP Short Term Goal 5 (Week 2): Patient will demonstrate functional problem solving for basic and familiar tasks with Min A multimodal cues.   Weekly Progress Updates: Patient has made excellent gains and has met 4 of 4 STG's this reporting period due to increased attention, orientation, intellectual awareness and attention to  left field of environment. Currently, patient is oriented X 4 with Mod I and requires Min A multimodal cues for selective attention for 30 minutes, Mod A for intellectual awareness of deficits, functional problem solving and recall and Max A for safety in regards to use of call bell and requesting assistance. Patient and family education is ongoing. Patient would benefit from continued skilled SLP intervention to maximize her cognitive function and overall functional independence.    Intensity: Minumum of 1-2 x/day, 30 to 90 minutes Frequency: 3 to 5 out of 7 days Duration/Length of Stay: 02/14/15 Treatment/Interventions: Cognitive remediation/compensation;Cueing hierarchy;Environmental controls;Functional tasks;Internal/external aids;Patient/family education;Therapeutic Activities   Daily Session  Skilled Therapeutic Interventions: Skilled treatment session focused on cognitive goals. Upon arrival, patient was awake while supine in bed. Patient followed 1 step commands with extra time and repetition in regards to bed mobility and transfer. Patient was transferred to wheelchair with +2 assist for safety. SLP facilitated session by providing extra time for tray set-up. Patient spontaneously attempted to use her [name] as a stabilizer but required assistance for completion of tasks in regards to opening containers. Patient demonstrated selective attention to self-feeding for 30 minutes and attended to left field of environment to locate items on tray with Min A verbal cues and was independently oriented X 4. Patient completed self-care tasks with extra time and Min A for attention to [name]. Patient also required constant cueing for intellectual awareness of postioning in the wheelchair throughout the session. Patient left upright in wheelchair with  quick release belt in place and all needs within reach. Continue with current plan of care.          FIM:  Comprehension Comprehension Mode:  Auditory Comprehension: 4-Understands basic 75 - 89% of the time/requires cueing 10 - 24% of the time Expression Expression Mode: Verbal Expression: 4-Expresses basic 75 - 89% of the time/requires cueing 10 - 24% of the time. Needs helper to occlude trach/needs to repeat words. Problem Solving Problem Solving: 3-Solves basic 50 - 74% of the time/requires cueing 25 - 49% of the time Memory Memory: 3-Recognizes or recalls 50 - 74% of the time/requires cueing 25 - 49% of the time Pain No/Denies Pain   Therapy/Group: Individual Therapy  [name] 01/27/2015, 11:25 AM

## 2015-01-28 ENCOUNTER — Inpatient Hospital Stay (HOSPITAL_COMMUNITY): Payer: Medicare Other | Admitting: Physical Therapy

## 2015-01-28 MED ORDER — TRAZODONE HCL 50 MG PO TABS
25.0000 mg | ORAL_TABLET | Freq: Every evening | ORAL | Status: DC | PRN
  Administered 2015-01-29: 25 mg via ORAL
  Filled 2015-01-28: qty 1

## 2015-01-28 MED ORDER — GABAPENTIN 300 MG PO CAPS
300.0000 mg | ORAL_CAPSULE | Freq: Every day | ORAL | Status: DC
  Administered 2015-01-28 – 2015-02-09 (×13): 300 mg via ORAL
  Filled 2015-01-28 (×14): qty 1

## 2015-01-28 NOTE — Progress Notes (Signed)
[facility] [facility] PHYSICAL MEDICINE & REHABILITATION     PROGRESS NOTE    Subjective/Complaints: Grand Daughter at bedside Pt c/o RLE pain at night  ROS: Pt denies fever, rash/itching, headache, blurred or double vision,   vomiting, abdominal pain, diarrhea, chest pain, shortness of breath, palpitations,+ dysuria,    Objective: Vital Signs: Blood pressure 126/66, pulse 70, temperature 97.8 F (36.6 C), temperature source Oral, resp. rate 18, weight 72.4 kg (159 lb 9.8 oz), SpO2 97 %. No results found. No results for input(s): WBC, HGB, HCT, PLT in the last 72 hours. No results for input(s): NA, K, CL, GLUCOSE, BUN, CREATININE, CALCIUM in the last 72 hours.  Invalid input(s): CO CBG (last 3)  No results for input(s): GLUCAP in the last 72 hours.  Wt Readings from Last 3 Encounters:  01/27/15 72.4 kg (159 lb 9.8 oz)  01/17/15 71.215 kg (157 lb)  01/11/15 71.385 kg (157 lb 6 oz)    Physical Exam:  Constitutional:  81 year old right-handed female in no distress, Very HOH Eyes: EOM are normal.  Mouth: dentition fair, oral mucosa moist Neck: Normal range of motion. Neck supple. No thyromegaly present.  Cardiovascular: Normal rate and regular rhythm.no murmurs or rubs  Respiratory: Effort normal and breath sounds normal. No respiratory distress. no wheezes GI: Soft. Bowel sounds are normal. She exhibits no distension.  Neurological: She is alert and attentive.  Follows simple commands with visual cues, despite HOH. Limited insight and awareness. [name] spastic (1+/4) with underlying 1-2/5 strength proximal to distal--apraxia.Marland Kitchen LLE:  Tone 2/4 today hamstrings and calf. MMT: 1-2 hf, ke and 1 ankle.  inattention to left. DTR's 1+ right and left. No visible spasms or clonus Skin: Skin is warm and dry.  Psychiatric: She has a normal mood and affect   Assessment/Plan: 1. Functional deficits secondary to right ACA and left thalamic infarcts after recent lumbar decompression which require  3+ hours per day of interdisciplinary therapy in a comprehensive inpatient rehab setting. Physiatrist is providing close team supervision and 24 hour management of active medical problems listed below. Physiatrist and rehab team continue to assess barriers to discharge/monitor patient progress toward functional and medical goals. FIM: FIM - Bathing Bathing Steps Patient Completed: Chest, Left Arm, Abdomen, Front perineal area, Right upper leg, Left upper leg Bathing: 3: Mod-Patient completes 5-7 40f 10 parts or 50-74%  FIM - Upper Body Dressing/Undressing Upper body dressing/undressing steps patient completed: Thread/unthread right sleeve of pullover shirt/dresss, Put head through opening of pull over shirt/dress Upper body dressing/undressing: 3: Mod-Patient completed 50-74% of tasks FIM - Lower Body Dressing/Undressing Lower body dressing/undressing: 2: Max-Patient completed 25-49% of tasks  FIM - Toileting Toileting steps completed by patient: Adjust clothing prior to toileting Toileting Assistive Devices: Grab bar or rail for support Toileting: 2: Max-Patient completed 1 of 3 steps  FIM - Diplomatic Services operational officer Devices: Grab bars Toilet Transfers: 2-To toilet/BSC: Max A (lift and lower assist), 2-From toilet/BSC: Max A (lift and lower assist)  FIM - Press photographer Assistive Devices: Arm rests Bed/Chair Transfer: 2: Supine > Sit: Max A (lifting assist/Pt. 25-49%), 2: Sit > Supine: Max A (lifting assist/Pt. 25-49%), 2: Bed > Chair or W/C: Max A (lift and lower assist), 2: Chair or W/C > Bed: Max A (lift and lower assist)  FIM - Locomotion: Wheelchair Distance: 50 Locomotion: Wheelchair: 2: Travels 50 - 149 ft with moderate assistance (Pt: 50 - 74%) FIM - Locomotion: Ambulation Locomotion: Ambulation Assistive Devices: Other (comment) (R  wall rail) Ambulation/Gait Assistance: 1: +2 Total assist, 2: Max assist Locomotion: Ambulation: 1: Two  helpers  Comprehension Comprehension Mode: Auditory Comprehension: 4-Understands basic 75 - 89% of the time/requires cueing 10 - 24% of the time  Expression Expression Mode: Verbal Expression: 4-Expresses basic 75 - 89% of the time/requires cueing 10 - 24% of the time. Needs helper to occlude trach/needs to repeat words.  Social Interaction Social Interaction: 4-Interacts appropriately 75 - 89% of the time - Needs redirection for appropriate language or to initiate interaction.  Problem Solving Problem Solving: 3-Solves basic 50 - 74% of the time/requires cueing 25 - 49% of the time  Memory Memory Mode: Not assessed Memory: 3-Recognizes or recalls 50 - 74% of the time/requires cueing 25 - 49% of the time  Medical Problem List and Plan: 1. Functional deficits secondary to status post lumbar decompression 01/17/2015 with postoperative right ACA, left thalamic infarcts.Has left hemiparesis Status post loop recorder -continue therapies---slow progress 2. DVT Prophylaxis/Anticoagulation: Subcutaneous heparin indicated given VTE risk. Monitor platelet counts and any signs of bleeding -dopplers   negative 3. Pain Management:    -tone seems increased LLE  -continue flexeril  q8 prn  -continued gabapentin  at 2100 for "leg spasms"   - tramadol 25-50mg  q6 prn  -family not helping matters quite honestly   -consider resuming low dose baclofen on scheduled basis   4. Hypertension. Lopressor 25 mg twice a day. Generally fair control. Still having periodic elevation since CVA. 5. Neuropsych: This patient is not yet capable of making decisions on her own behalf. 6. Skin/Wound Care: Routine skin checks and local care to surgical site. Encourage adequate nutrition 7. Fluids/Electrolytes/Nutrition:  replacing potassium  -potassium recovering  -sodium stable at 130 8. Hyperlipidemia. Pravachol 9. Neurogenic bladder:  empiric amoxil empirically for 100k e coli---sens   -I/O cath prn- pt  wants foley back in will first tx UTI   -low dose flomax also--HS  LOS (Days) 8 A FACE TO FACE EVALUATION WAS PERFORMED  Erick Colace 01/28/2015 9:48 AM

## 2015-01-28 NOTE — Progress Notes (Addendum)
Physical Therapy Session Note  Patient Details  Name: Angela Keith MRN: [id number] Date of Birth: January 16, 1930  Today's Date: 01/28/2015 PT Individual Time:  -  1430-1530 Treatment Time: 60 min     Short Term Goals: Week 2:  PT Short Term Goal 1 (Week 2): Pt will ambulate about 25 feet with LRAD and max A.  PT Short Term Goal 2 (Week 2): Pt will propel w/c about 75 feet with min A.  PT Short Term Goal 3 (Week 2): Pt will ascend/descend 2 stairs with 1 rail and max A.  PT Short Term Goal 4 (Week 2): Pt will consistently sit to stand req mod A.  PT Short Term Goal 5 (Week 2): Pt will demonstrate static sit balance x 30 seconds with min A.   Skilled Therapeutic Interventions/Progress Updates:    Therapeutic Activity: Pt received in bed without pants on, agreeable to PT. PT instructs pt in log rolling R and L in bed req min A with rail to don pants (+2 assist) and place LSO brace for comfort. PT instructs pt in R side lie to sit transfer req max A, and in squat-pivot transfer bed to w/c to L req mod A.   W/C Management: PT instructs pt in w/c propulsion using R UE/LE technique x 150' req SBA-min A with repeated verbal cues to use foot to steer - significant ataxic/lateral deviation propulsion pattern due to impaired coordination steering with R foot while propelling with R arm. .   Gait Training: PT instructs pt in ambulation with R wall rail x 30' req mod-max A from PT and +2 w/c follow for safety. PT instructs pt in ambulation with R HW x 50' req max A from PT and +2 w/c follow for safety. PT must dependently progress L LE in swing phase and block L knee from buckling & hyperextension in stance, give pt single step cues for sequencing 3 point gait pattern, and assist with lateral weight shift during gait.   Neuromuscular Reeducation: PT instructs pt in mirror visual feedback therapy to increase attention to L UE using mirror box. Exercises included: fist pumps, elbow flexion, forearm  pronation/supination, and "clapping" hands together on the mirror x 20 reps each. Pt req repeated verbal cues to keep eyes on her "Left hand" or "Left arm" (referring to the mirror reflection). Best response seen with fist pumps.   Pt is progressing with functional mobility, but continues to be very impaired. Continue per PT POC.  Addendum: Pt will benefit from L AFO and possible L knee brace trial to improve L foot clearance and L knee stability with gait.    Therapy Documentation Precautions:  Precautions Precautions: Fall, Back Precaution Comments: daughter brought in lumbar corset from home for comfort, L inattention Restrictions Weight Bearing Restrictions: No Pain: Pain Assessment Pain Assessment: Faces Pain Score: 4  Pain Type: Surgical pain Pain Location: Back Pain Orientation: Lower Pain Descriptors / Indicators: Sore Pain Onset: On-going Pain Intervention(s): Repositioned Multiple Pain Sites: No  See FIM for current functional status  Therapy/Group: Individual Therapy  Ethyle Tiedt M 01/28/2015, 12:31 PM

## 2015-01-29 ENCOUNTER — Inpatient Hospital Stay (HOSPITAL_COMMUNITY): Payer: Medicare Other | Admitting: Occupational Therapy

## 2015-01-29 NOTE — Progress Notes (Signed)
Occupational Therapy Session Note  Patient Details  Name: Angela Keith MRN: [id number] Date of Birth: 08-16-1929  Today's Date: 01/29/2015 OT Individual Time:  -   0845-0930  (45 min)  1st session                                             Time:  1400-1445  (45 min)  2nd session     Short Term Goals: Week 1:  OT Short Term Goal 1 (Week 1): Pt will perform UB dressing with mod A in order to decrease level of assist with self care. OT Short Term Goal 2 (Week 1): Pt will perform toileting with assist from 1 person for clothing management and hygiene.  OT Short Term Goal 3 (Week 1): Pt will perform LB dressing with max A or better for standing balance.  OT Short Term Goal 4 (Week 1): Pt will perform grooming with min A in order to increase I with self care. :     Skilled Therapeutic Interventions/Progress Updates:    1st session: OT addressed basic ADLtraining at sink level in wc.  Addressed functional transfers, bed mobility, standing balance, attention, attention, leftupper extremity therapeutic activities in functional task.  OT provided tactile and gestural cues for patient to use left arm.  Gestural cues were also required for pt to initiate the tasks   Engaged in sitting balance activities while bathing and dressing.  She required max assist with sitting balance and manual assist for postural control.  Transferred from bed to wc with total assist.  Utilized [name] with increased grasp reflex and manual assist to release.  Left pt in wc with safety belt on and call bell,phone within reach.     2nd session:   Skilled OT intervention with treatment focus on the following:  Transfers, sitting balance, postural control.  [name] closed chain activities.  Transferred to mat with max assist.  Pt leans to left and needs manual assist for postural control.  Addressed [name] NMRE in functional activates.    Provided mod cues for selective attention in busy environment.  Pt taken back to room and left with  all needs in reach.      Therapy Documentation Precautions:  Precautions Precautions: Fall, Back Precaution Comments: daughter brought in lumbar corset from home for comfort, L inattention Restrictions Weight Bearing Restrictions: No      Pain:  2/10  Back  1st session and 2nd session             See FIM for current functional status  Therapy/Group: Individual Therapy  Humberto Seals 01/29/2015, 7:51 AM

## 2015-01-29 NOTE — Progress Notes (Signed)
[place] PHYSICAL MEDICINE & REHABILITATION     PROGRESS NOTE    Subjective/Complaints: Grand Daughter wishes to take pt on grounds pass No pain c/os  ROS: Pt denies f   vomiting, abdominal pain, diarrhea, chest pain, shortness of breath,   Objective: Vital Signs: Blood pressure 122/64, pulse 78, temperature 98.1 F (36.7 C), temperature source Oral, resp. rate 18, weight 72.4 kg (159 lb 9.8 oz), SpO2 95 %. No results found. No results for input(s): WBC, HGB, HCT, PLT in the last 72 hours. No results for input(s): NA, K, CL, GLUCOSE, BUN, CREATININE, CALCIUM in the last 72 hours.  Invalid input(s): CO CBG (last 3)  No results for input(s): GLUCAP in the last 72 hours.  Wt Readings from Last 3 Encounters:  01/27/15 72.4 kg (159 lb 9.8 oz)  01/17/15 71.215 kg (157 lb)  01/11/15 71.385 kg (157 lb 6 oz)    Physical Exam:  Constitutional:  81 year old right-handed female in no distress, Very HOH Eyes: EOM are normal.  Mouth: dentition fair, oral mucosa moist Neck: Normal range of motion. Neck supple. No thyromegaly present.  Cardiovascular: Normal rate and regular rhythm.no murmurs or rubs  Respiratory: Effort normal and breath sounds normal. No respiratory distress. no wheezes GI: Soft. Bowel sounds are normal. She exhibits no distension.  Neurological: She is alert and attentive.  Follows simple commands with visual cues, despite HOH. Limited insight and awareness. [name] spastic (1+/4) with underlying 1-2/5 strength proximal to distal--apraxia.Marland Kitchen LLE:  Tone 2/4 today hamstrings and calf. MMT: 1-2 hf, ke and 1 ankle.  inattention to left. DTR's 1+ right and left. No visible spasms or clonus Skin: Skin is warm and dry.  Psychiatric: She has a normal mood and affect   Assessment/Plan: 1. Functional deficits secondary to right ACA and left thalamic infarcts after recent lumbar decompression which require 3+ hours per day of interdisciplinary therapy in a comprehensive  inpatient rehab setting. Physiatrist is providing close team supervision and 24 hour management of active medical problems listed below. Physiatrist and rehab team continue to assess barriers to discharge/monitor patient progress toward functional and medical goals. FIM: FIM - Bathing Bathing Steps Patient Completed: Chest, Left Arm, Abdomen, Front perineal area, Right upper leg, Left upper leg Bathing: 3: Mod-Patient completes 5-7 36f 10 parts or 50-74%  FIM - Upper Body Dressing/Undressing Upper body dressing/undressing steps patient completed: Thread/unthread right sleeve of pullover shirt/dresss, Put head through opening of pull over shirt/dress Upper body dressing/undressing: 3: Mod-Patient completed 50-74% of tasks FIM - Lower Body Dressing/Undressing Lower body dressing/undressing: 1: Two helpers  FIM - Toileting Toileting steps completed by patient: Adjust clothing prior to toileting Toileting Assistive Devices: Grab bar or rail for support Toileting: 2: Max-Patient completed 1 of 3 steps  FIM - Diplomatic Services operational officer Devices: Grab bars Toilet Transfers: 2-To toilet/BSC: Max A (lift and lower assist), 2-From toilet/BSC: Max A (lift and lower assist)  FIM - Banker Devices: Arm rests, Orthosis (LSO) Bed/Chair Transfer: 2: Supine > Sit: Max A (lifting assist/Pt. 25-49%), 3: Bed > Chair or W/C: Mod A (lift or lower assist)  FIM - Locomotion: Wheelchair Distance: 150 Locomotion: Wheelchair: 4: Travels 150 ft or more: maneuvers on rugs and over door sillls with minimal assistance (Pt.>75%) FIM - Locomotion: Ambulation Locomotion: Ambulation Assistive Devices: Walker - Hemi, Other (comment) (R wall rail) Ambulation/Gait Assistance: 1: +2 Total assist, 2: Max assist Locomotion: Ambulation: 1: Two helpers  Comprehension Comprehension Mode: Auditory Comprehension:  4-Understands basic 75 - 89% of the time/requires cueing  10 - 24% of the time  Expression Expression Mode: Verbal Expression: 4-Expresses basic 75 - 89% of the time/requires cueing 10 - 24% of the time. Needs helper to occlude trach/needs to repeat words.  Social Interaction Social Interaction: 4-Interacts appropriately 75 - 89% of the time - Needs redirection for appropriate language or to initiate interaction.  Problem Solving Problem Solving: 3-Solves basic 50 - 74% of the time/requires cueing 25 - 49% of the time  Memory Memory Mode: Not assessed Memory: 3-Recognizes or recalls 50 - 74% of the time/requires cueing 25 - 49% of the time  Medical Problem List and Plan: 1. Functional deficits secondary to status post lumbar decompression 01/17/2015 with postoperative right ACA, left thalamic infarcts.Has left hemiparesis Status post loop recorder -continue therapies---slow progress 2. DVT Prophylaxis/Anticoagulation: Subcutaneous heparin indicated given VTE risk. Monitor platelet counts and any signs of bleeding -dopplers   negative 3. Pain Management:    -tone seems increased LLE  -continue flexeril 5mg  q8 prn  -continued gabapentin 300mg  at 2100 for "leg spasms"   - tramadol 25-50mg  q6 prn    -consider resuming low dose baclofen on scheduled basis   4. Hypertension. Lopressor 25 mg twice a day. Generally fair control. Still having periodic elevation since CVA. 5. Neuropsych: This patient is not yet capable of making decisions on her own behalf. 6. Skin/Wound Care: Routine skin checks and local care to surgical site. Encourage adequate nutrition 7. Fluids/Electrolytes/Nutrition:  replacing potassium  -potassium recovering  -sodium stable at 130 8. Hyperlipidemia. Pravachol 9. Neurogenic bladder:  empiric amoxil empirically for 100k e coli---sens   -I/O cath , retaining urine again- pt wants foley back in will first tx UTI   -low dose flomax also--HS  LOS (Days) 9 A FACE TO FACE EVALUATION WAS PERFORMED  Erick Colace 01/29/2015 9:42 AM

## 2015-01-30 ENCOUNTER — Inpatient Hospital Stay (HOSPITAL_COMMUNITY): Payer: Medicare Other | Admitting: Physical Therapy

## 2015-01-30 ENCOUNTER — Inpatient Hospital Stay (HOSPITAL_COMMUNITY): Payer: Medicare Other

## 2015-01-30 ENCOUNTER — Inpatient Hospital Stay (HOSPITAL_COMMUNITY): Payer: Medicare Other | Admitting: Speech Pathology

## 2015-01-30 ENCOUNTER — Encounter: Payer: Self-pay | Admitting: Internal Medicine

## 2015-01-30 DIAGNOSIS — G4700 Insomnia, unspecified: Secondary | ICD-10-CM

## 2015-01-30 MED ORDER — TRAZODONE HCL 50 MG PO TABS
25.0000 mg | ORAL_TABLET | Freq: Every day | ORAL | Status: DC
  Administered 2015-01-30 – 2015-02-09 (×11): 25 mg via ORAL
  Filled 2015-01-30 (×11): qty 1

## 2015-01-30 MED ORDER — TRAZODONE HCL 50 MG PO TABS: 25.0000 mg | ORAL_TABLET | Freq: Every day | ORAL | Status: DC

## 2015-01-30 NOTE — Progress Notes (Signed)
Speech Language Pathology Daily Session Note  Patient Details  Name: Angela Keith MRN: [id number] Date of Birth: [birth year]  Today's Date: 01/30/2015 SLP Individual Time: 0800-0900 SLP Individual Time Calculation (min): 60 min  Short Term Goals: Week 2: SLP Short Term Goal 1 (Week 2): Patient will attend to left field of enviornment and body during familiar structured tasks with Min multimodal cues  SLP Short Term Goal 2 (Week 2): Patient will select attention to basic, familar tasks for 45 minutes with Min multimodal cues  SLP Short Term Goal 3 (Week 2): Patient will utilize call bell to request assistance in 50% of observable opportunities SLP Short Term Goal 4 (Week 2): Patient will recall new, daily information with Min A multimodal cues for use of external aids.  SLP Short Term Goal 5 (Week 2): Patient will demonstrate functional problem solving for basic and familiar tasks with Min A multimodal cues.   Skilled Therapeutic Interventions: Skilled treatment session focused on cognitive goals.  Upon arrival, patient was awake while supine in bed and consuming breakfast. Patient independently donned her right hearing aid but required Max A to donn her left hearing aid with use of her [name].  Patient independently used the call bell to request additional assistance for transfer to wheelchair. Patient was transferred to wheelchair with +2 assist for safety and required Max A multimodal cues for awareness of poor positioning and lateral lean to the left while upright in the wheelchair. Patient performed self-care tasks at the sink (brushing hair and teeth) with Mod A verbal cues and extra time for functional problem solving and attention to [name] with tasks.  Patient was emotionally labile throughout session in regards to her current situation and physical impairments and clinician provided emotional support.  Patient was left upright in wheelchair with NT present. Continue with current plan of care.     FIM:  Comprehension Comprehension Mode: Auditory Comprehension: 4-Understands basic 75 - 89% of the time/requires cueing 10 - 24% of the time Expression Expression Mode: Verbal Expression: 4-Expresses basic 75 - 89% of the time/requires cueing 10 - 24% of the time. Needs helper to occlude trach/needs to repeat words. Social Interaction Social Interaction: 4-Interacts appropriately 75 - 89% of the time - Needs redirection for appropriate language or to initiate interaction. Problem Solving Problem Solving: 3-Solves basic 50 - 74% of the time/requires cueing 25 - 49% of the time Memory Memory: 3-Recognizes or recalls 50 - 74% of the time/requires cueing 25 - 49% of the time  Pain Pain Assessment Pain Assessment: No/denies pain  Therapy/Group: Individual Therapy  Athalia Setterlund 01/30/2015, 3:26 PM

## 2015-01-30 NOTE — Progress Notes (Signed)
Physical Therapy Session Note  Patient Details  Name: Angela Keith MRN: [id number] Date of Birth: 04/22/1930  Today's Date: 01/30/2015 PT Individual Time: 1696-7893 PT Individual Time Calculation (min): 54 min   Short Term Goals: Week 1:  PT Short Term Goal 1 (Week 1): Pt will transfer supine to edge of bed, edge of bed to supine with max A.  PT Short Term Goal 1 - Progress (Week 1): Met PT Short Term Goal 2 (Week 1): Pt will transfer bed to chair, chair to bed with max A.  PT Short Term Goal 2 - Progress (Week 1): Met PT Short Term Goal 3 (Week 1): Pt will ambulate about 25 feet with LRAD and max A.  PT Short Term Goal 3 - Progress (Week 1): Progressing toward goal PT Short Term Goal 4 (Week 1): Pt will propel w/c about 75 feet with min A.  PT Short Term Goal 4 - Progress (Week 1): Progressing toward goal PT Short Term Goal 5 (Week 1): Pt will ascend/descend 2 stairs with 1 rail and max A.  PT Short Term Goal 5 - Progress (Week 1): Progressing toward goal  Skilled Therapeutic Interventions/Progress Updates:   Pt received in w/c with pt leaning heavily to the L and reporting pain in her buttocks; performed repositioning in the w/c with +2 A.  Pt transferred to gym in w/c total A.  Performed transfer w/c > mat to L with max A for trunk control (over the back technique) and to minimize lateropulsion.  On mat performed NMR: see below for details.  Performed transfers mat > w/c > bed to R side with squat pivot (over the back technique) with max A for trunk control and sequencing.  Performed sit > supine in bed with mod A and performed rolling to L side to doff LSO with min-mod A and verbal and visual cues for sequencing.  Pt left with all items within reach.    Therapy Documentation Precautions:  Precautions Precautions: Fall, Back Precaution Comments: daughter brought in lumbar corset from home for comfort, L inattention Restrictions Weight Bearing Restrictions: No Pain: Pain  Assessment Pain Assessment: No/denies pain Other Treatments: Treatments Neuromuscular Facilitation: Left;Upper Extremity;Lower Extremity;Forced use;Activity to increase motor control;Activity to increase timing and sequencing;Activity to increase grading;Activity to increase sustained activation;Activity to increase lateral weight shifting;Activity to increase anterior-posterior weight shifting seated on mat with out LE support on floor to increase use/activation of trunk during reaching with R, L and bilat UE for objects placed outside of BOS forwards, up and to the R or straight in front of pt to facilitate trunk extension/elongation, R trunk elongation/L trunk shortening to allow pt to move outside of BOS but maintain head/trunk control.  Also performed bimanual reaching for increased attention and activation of L and increased activation in L trunk.  Performed with mod A and max verbal/visual cues for attention and sequencing.  Pt also requires increased time to process and respond to cues.    See FIM for current functional status  Therapy/Group: Individual Therapy  Raylene Everts [facility] 01/30/2015, 4:56 PM

## 2015-01-30 NOTE — Progress Notes (Signed)
Occupational Therapy Weekly Progress Note  Patient Details  Name: Angela Keith MRN: [id number] Date of Birth: 1929/07/12  Beginning of progress report period: January 21, 2015 End of progress report period: January 30, 2015  Today's Date: 01/30/2015 OT Individual Time: 1030-1200 OT Individual Time Calculation (min): 90 min    Patient has met 4 of 4 short term goals.  Pt continues to demonstrate improved attention, sequencing and awareness during BADL and has completed B & D session with only 1 helper at overall max assist.  Pt   Patient continues to demonstrate the following deficits: Impaired postural control, impaired [name] function (gross and [facility]), impaired transfers, impaired attention, problem-solving and awareness and therefore will continue to benefit from skilled OT intervention to enhance overall performance with Reduce care partner burden.  Patient progressing toward long term goals..  Continue plan of care.  OT Short Term Goals Week 1:  OT Short Term Goal 1 (Week 1): Pt will perform UB dressing with mod A in order to decrease level of assist with self care. OT Short Term Goal 1 - Progress (Week 1): Met OT Short Term Goal 2 (Week 1): Pt will perform toileting with assist from 1 person for clothing management and hygiene.  OT Short Term Goal 2 - Progress (Week 1): Met OT Short Term Goal 3 (Week 1): Pt will perform LB dressing with max A or better for standing balance.  OT Short Term Goal 3 - Progress (Week 1): Met OT Short Term Goal 4 (Week 1): Pt will perform grooming with min A in order to increase I with self care. OT Short Term Goal 4 - Progress (Week 1): Met Week 2:  OT Short Term Goal 1 (Week 2): Pt will complete bathing seated with mod assist and min cues (verbal, environmental or gestural) to maintain neutral sitting posture. OT Short Term Goal 2 (Week 2): Pt will demo ability to complete stand-pivot transfer, bed<>w/c with mod assist OT Short Term Goal 3 (Week 2): Pt will  improve functional use of left hand during BADL with min assist to release grasp OT Short Term Goal 4 (Week 2): Pt will dress upper body with min assist OT Short Term Goal 5 (Week 2): Pt will dress lower body with mod assist  Skilled Therapeutic Interventions/Progress Updates: ADL-retraining with focus on shower level B & D with only 1 helper.   Pt received supine in bed, alert and oriented and awaiting therapist.  With min vc to orient to planned task, pt completed bed mobility with overall mod assist and completed SPT to w/c with mod assist to lift (pt initiated counting "1-2-3"), mod assist to weight-shift, and mod assist to lower to chair.   Pt requires mod assist to reposition in w/c although with only min vc for postural correction and gentle manual contact at right shoulder to facilitate lean to right.   With tub bench reoriented to face out of shower stall, pt performed SPT using grab bar with mod assist to pivot and sit (pt=30%).   After setup to remove spinal brace, pt bathed seated using lateral lean to her left to wash under right buttocks.    Pt dried herself with min assist and donned shirt with mod assist to lift over head and down her back.  After setup to apply spinal brace, pt recovered to w/c with max assist and donned briefs, pants, and shoes with total assist d/t left LE weakness with weight-bearing.   No LE spasms noted during entire  session however pt was unable to advance left leg unassisted this session during stand-pivot transfers.   Pt's attention and awareness were excellent however pt continues to require frequent cues for postural correction, although improved with use of half-lap tray on w/c.   Pt groomed at sink with min assist to comb left side.    Pt is challenged by poor grasp/release at left hand (primarily left index finger and thumb).    Session concluded with 15 min of therapeutic activity in gym to reach with left hand, grasp object, shift weight to right and place object  in basket on her right side.    Pt's performance improved toward end of session, although inconsistent with releasing objects.    Therapy Documentation Precautions:  Precautions Precautions: Fall, Back Precaution Comments: daughter brought in lumbar corset from home for comfort, L inattention Restrictions Weight Bearing Restrictions: No  Pain: No/denies pain  See FIM for current functional status  Therapy/Group: Individual Therapy  Rodriguez Hevia 01/30/2015, 12:38 PM

## 2015-01-30 NOTE — Progress Notes (Signed)
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 granddaughter here late last night ROS: limited this morning--(hard to arouse)   Objective: Vital Signs: Blood pressure 105/53, pulse 63, temperature 97.5 F (36.4 C), temperature source Oral, resp. rate 16, weight 72.4 kg (159 lb 9.8 oz), SpO2 95 %. No results found. No results for input(s): WBC, HGB, HCT, PLT in the last 72 hours. No results for input(s): NA, K, CL, GLUCOSE, BUN, CREATININE, CALCIUM in the last 72 hours.  Invalid input(s): CO CBG (last 3)  No results for input(s): GLUCAP in the last 72 hours.  Wt Readings from Last 3 Encounters:  01/27/15 72.4 kg (159 lb 9.8 oz)  01/17/15 71.215 kg (157 lb)  01/11/15 71.385 kg (157 lb 6 oz)    Physical Exam:  Constitutional:  Snoring, difficult to arouse Eyes: EOM are normal.  Mouth: dentition fair, oral mucosa moist Neck: Normal range of motion. Neck supple. No thyromegaly present.  Cardiovascular: Normal rate and regular rhythm.no murmurs or rubs  Respiratory: Effort normal and breath sounds normal. No respiratory distress. no wheezes GI: Soft. Bowel sounds are normal. She exhibits no distension.  Neurological: She is alert and attentive.  HOH. Limited insight and awareness. [name] spastic (1+/4) with underlying 1-2/5 strength proximal to distal--apraxia.Marland Kitchen LLE:  Tone 2/4 today hamstrings and calf. MMT: 1-2 hf, ke and 1 ankle.  inattention to left. DTR's 1+ right and left. No visible spasms or clonus Skin: Skin is warm and dry.  Psychiatric: She has a normal mood and affect   Assessment/Plan: 1. Functional deficits secondary to right ACA and left thalamic infarcts after recent lumbar decompression which require 3+ hours per day of interdisciplinary therapy in a comprehensive inpatient rehab setting. Physiatrist is providing close team supervision and  24 hour management of active medical problems listed below. Physiatrist and rehab team continue to assess barriers to discharge/monitor patient progress toward functional and medical goals. FIM: FIM - Bathing Bathing Steps Patient Completed: Chest, Left Arm, Abdomen, Front perineal area, Right upper leg, Left upper leg Bathing: 3: Mod-Patient completes 5-7 77f 10 parts or 50-74%  FIM - Upper Body Dressing/Undressing Upper body dressing/undressing steps patient completed: Thread/unthread right sleeve of pullover shirt/dresss, Put head through opening of pull over shirt/dress Upper body dressing/undressing: 3: Mod-Patient completed 50-74% of tasks FIM - Lower Body Dressing/Undressing Lower body dressing/undressing: 1: Two helpers  FIM - Toileting Toileting steps completed by patient: Adjust clothing prior to toileting Toileting Assistive Devices: Grab bar or rail for support Toileting: 2: Max-Patient completed 1 of 3 steps  FIM - Diplomatic Services operational officer Devices: Grab bars Toilet Transfers: 2-To toilet/BSC: Max A (lift and lower assist), 2-From toilet/BSC: Max A (lift and lower assist)  FIM - Banker Devices: Arm rests, Orthosis Bed/Chair Transfer: 2: Supine > Sit: Max A (lifting assist/Pt. 25-49%), 2: Bed > Chair or W/C: Max A (lift and lower assist)  FIM - Locomotion: Wheelchair Distance: 150 Locomotion: Wheelchair: 4: Travels 150 ft or more: maneuvers on rugs and over door sillls with minimal assistance (Pt.>75%) FIM - Locomotion: Ambulation Locomotion: Ambulation Assistive Devices: Walker - Hemi, Other (comment) (R wall rail) Ambulation/Gait Assistance: 1: +2 Total assist, 2: Max assist Locomotion: Ambulation: 1: Two helpers  Comprehension Comprehension Mode: Auditory Comprehension: 4-Understands basic 75 - 89% of the time/requires cueing 10 - 24% of the time  Expression Expression Mode: Verbal  Expression: 4-Expresses  basic 75 - 89% of the time/requires cueing 10 - 24% of the time. Needs helper to occlude trach/needs to repeat words.  Social Interaction Social Interaction: 4-Interacts appropriately 75 - 89% of the time - Needs redirection for appropriate language or to initiate interaction.  Problem Solving Problem Solving: 3-Solves basic 50 - 74% of the time/requires cueing 25 - 49% of the time  Memory Memory Mode: Not assessed Memory: 3-Recognizes or recalls 50 - 74% of the time/requires cueing 25 - 49% of the time  Medical Problem List and Plan: 1. Functional deficits secondary to status post lumbar decompression 01/17/2015 with postoperative right ACA, left thalamic infarcts.Has left hemiparesis Status post loop recorder -continue therapies---slow progress -attempted to call granddaughter--(no answer) will retry later today 2. DVT Prophylaxis/Anticoagulation: Subcutaneous heparin indicated given VTE risk. Monitor platelet counts and any signs of bleeding -dopplers   negative 3. Pain Management:    -tone seems increased LLE  -continue flexeril 5mg  q8 prn  -continued gabapentin 300mg  at 2100 for sleep, restless leg sx  - tramadol 25-50mg  q6 prn  -consider scheduled baclofen, but don't want to increase med burden much more   4. Hypertension. Lopressor 25 mg twice a day. Generally fair control. Still having periodic elevation since CVA. 5. Neuropsych: This patient is not yet capable of making decisions on her own behalf. 6. Skin/Wound Care: Routine skin checks and local care to surgical site. Encourage adequate nutrition 7. Fluids/Electrolytes/Nutrition:  replacing potassium  -potassium recovering  -sodium stable at 130 8. Hyperlipidemia. Pravachol 9. Neurogenic bladder:  Urine with  100k e coli---sens to amoxil   -I/O cath prn---may need to reinsert foley   -low dose flomax also--HS 10. Sleep---schedule trazodone 25mg  qhs  LOS (Days) 10 A FACE TO FACE EVALUATION WAS  PERFORMED  SWARTZ,ZACHARY T 01/30/2015 8:51 AM

## 2015-01-30 NOTE — Progress Notes (Signed)
Patients granddaughter called to complain about the patients care and medications (flexerile and trazodone). Granddaughter requested Trazodone for there grandmother at HS. Trazodone not given previous night due to patient sleeping soundly (snoring). Tonight patient stated she could not sleep and she was in pain grimacing. Ultram 50 mg and Trazodone 25 mg given at 2234. Patient rechecked at 0030. Patient asleep and snoring after medications given.  Explained to granddaughter that all medication changes will be discussed with the physician. Any patient care complaints with the charge nurse. Granddaughter refused to speak with the charge nurse and became emotional and irate yelling on the telephone. She stated she would like to talk to the physician in the AM.

## 2015-01-31 ENCOUNTER — Inpatient Hospital Stay (HOSPITAL_COMMUNITY): Payer: Medicare Other | Admitting: Physical Therapy

## 2015-01-31 ENCOUNTER — Inpatient Hospital Stay (HOSPITAL_COMMUNITY): Payer: Medicare Other

## 2015-01-31 ENCOUNTER — Inpatient Hospital Stay (HOSPITAL_COMMUNITY): Payer: Medicare Other | Admitting: Speech Pathology

## 2015-01-31 LAB — COMPREHENSIVE METABOLIC PANEL
ALT: 23 U/L (ref 14–54)
AST: 26 U/L (ref 15–41)
Albumin: 3 g/dL — ABNORMAL LOW (ref 3.5–5.0)
Alkaline Phosphatase: 56 U/L (ref 38–126)
Anion gap: 9 (ref 5–15)
BILIRUBIN TOTAL: 0.4 mg/dL (ref 0.3–1.2)
BUN: 13 mg/dL (ref 6–20)
CHLORIDE: 100 mmol/L — AB (ref 101–111)
CO2: 25 mmol/L (ref 22–32)
CREATININE: 1.08 mg/dL — AB (ref 0.44–1.00)
Calcium: 9.5 mg/dL (ref 8.9–10.3)
GFR calc Af Amer: 54 mL/min — ABNORMAL LOW (ref >60)
GFR calc non Af Amer: 46 mL/min — ABNORMAL LOW (ref >60)
Glucose, Bld: 106 mg/dL — ABNORMAL HIGH (ref 65–99)
POTASSIUM: 4.5 mmol/L (ref 3.5–5.1)
SODIUM: 134 mmol/L — AB (ref 135–145)
Total Protein: 6.3 g/dL — ABNORMAL LOW (ref 6.5–8.1)

## 2015-01-31 LAB — CBC
HEMATOCRIT: 36.8 % (ref 36.0–46.0)
HEMOGLOBIN: 12.1 g/dL (ref 12.0–15.0)
MCH: 28.7 pg (ref 26.0–34.0)
MCHC: 32.9 g/dL (ref 30.0–36.0)
MCV: 87.4 fL (ref 78.0–100.0)
Platelets: 373 10*3/uL (ref 150–400)
RBC: 4.21 MIL/uL (ref 3.87–5.11)
RDW: 14.2 % (ref 11.5–15.5)
WBC: 9 10*3/uL (ref 4.0–10.5)

## 2015-01-31 LAB — MAGNESIUM: Magnesium: 2 mg/dL (ref 1.7–2.4)

## 2015-01-31 NOTE — Progress Notes (Signed)
[facility] PHYSICAL MEDICINE & REHABILITATION     PROGRESS NOTE    Subjective/Complaints: Staff state patient slept throughout the night. Patient states she didn't sleep well. ROS: limited due to cognition, hearing, language   Objective: Vital Signs: Blood pressure 132/66, pulse 72, temperature 97.6 F (36.4 C), temperature source Oral, resp. rate 16, weight 72.4 kg (159 lb 9.8 oz), SpO2 96 %. No results found.  Recent Labs  01/31/15 0519  WBC 9.0  HGB 12.1  HCT 36.8  PLT 373    Recent Labs  01/31/15 0519  NA 134*  K 4.5  CL 100*  GLUCOSE 106*  BUN 13  CREATININE 1.08*  CALCIUM 9.5   CBG (last 3)  No results for input(s): GLUCAP in the last 72 hours.  Wt Readings from Last 3 Encounters:  01/27/15 72.4 kg (159 lb 9.8 oz)  01/17/15 71.215 kg (157 lb)  01/11/15 71.385 kg (157 lb 6 oz)    Physical Exam:  Constitutional:  Snoring, difficult to arouse Eyes: EOM are normal.  Mouth: dentition fair, oral mucosa moist Neck: Normal range of motion. Neck supple. No thyromegaly present.  Cardiovascular: Normal rate and regular rhythm.no murmurs or rubs  Respiratory: Effort normal and breath sounds normal. No respiratory distress. no wheezes GI: Soft. Bowel sounds are normal. She exhibits no distension.  Neurological: She is alert and attentive.  HOH. Limited insight and awareness. [name] spastic (1+/4) with underlying 1-2/5 strength proximal to distal--apraxia.Marland Kitchen LLE:  Tone 2/4 today hamstrings and calf. MMT: 1-2 hf, ke and 1 ankle.  inattention to left. DTR's 1+ right and left. No visible spasms or clonus Skin: Skin is warm and dry.  Psychiatric: She has a normal mood and affect   Assessment/Plan: 1. Functional deficits secondary to right ACA and left thalamic infarcts after recent lumbar decompression which require 3+ hours per day of interdisciplinary therapy in a comprehensive inpatient rehab setting. Physiatrist is providing close team supervision and 24 hour  management of active medical problems listed below. Physiatrist and rehab team continue to assess barriers to discharge/monitor patient progress toward functional and medical goals. FIM: FIM - Bathing Bathing Steps Patient Completed: Chest, Left Arm, Abdomen, Front perineal area, Right upper leg, Left upper leg Bathing: 3: Mod-Patient completes 5-7 71f 10 parts or 50-74%  FIM - Upper Body Dressing/Undressing Upper body dressing/undressing steps patient completed: Thread/unthread right sleeve of pullover shirt/dresss, Thread/unthread left sleeve of pullover shirt/dress Upper body dressing/undressing: 3: Mod-Patient completed 50-74% of tasks FIM - Lower Body Dressing/Undressing Lower body dressing/undressing: 2: Max-Patient completed 25-49% of tasks  FIM - Toileting Toileting steps completed by patient: Adjust clothing prior to toileting Toileting Assistive Devices: Grab bar or rail for support Toileting: 2: Max-Patient completed 1 of 3 steps  FIM - Diplomatic Services operational officer Devices: Grab bars Toilet Transfers: 2-To toilet/BSC: Max A (lift and lower assist), 2-From toilet/BSC: Max A (lift and lower assist)  FIM - Banker Devices: Orthosis, Bed rails, Arm rests Bed/Chair Transfer: 3: Sit > Supine: Mod A (lifting assist/Pt. 50-74%/lift 2 legs), 2: Bed > Chair or W/C: Max A (lift and lower assist), 2: Chair or W/C > Bed: Max A (lift and lower assist)  FIM - Locomotion: Wheelchair Distance: 150 Locomotion: Wheelchair: 1: Total Assistance/staff pushes wheelchair (Pt<25%) FIM - Locomotion: Ambulation Locomotion: Ambulation Assistive Devices: Walker - Hemi, Other (comment) (R wall rail) Ambulation/Gait Assistance: 1: +2 Total assist, 2: Max assist Locomotion: Ambulation: 0: Activity did not occur  Comprehension  Comprehension Mode: Auditory Comprehension: 5-Understands basic 90% of the time/requires cueing < 10% of the  time  Expression Expression Mode: Verbal Expression: 4-Expresses basic 75 - 89% of the time/requires cueing 10 - 24% of the time. Needs helper to occlude trach/needs to repeat words.  Social Interaction Social Interaction: 4-Interacts appropriately 75 - 89% of the time - Needs redirection for appropriate language or to initiate interaction.  Problem Solving Problem Solving: 4-Solves basic 75 - 89% of the time/requires cueing 10 - 24% of the time  Memory Memory Mode: Not assessed Memory: 4-Recognizes or recalls 75 - 89% of the time/requires cueing 10 - 24% of the time  Medical Problem List and Plan: 1. Functional deficits secondary to status post lumbar decompression 01/17/2015 with postoperative right ACA, left thalamic infarcts.Has left hemiparesis Status post loop recorder -continue therapies---slow progress---team conference today to further discuss -reached out to granddaughter twice yesterday (left VM). Was not at hospital yesterday either 2. DVT Prophylaxis/Anticoagulation: Subcutaneous heparin indicated given VTE risk. Monitor platelet counts and any signs of bleeding -dopplers negative 3. Pain Management:    -continued tone in LLE  -continue flexeril 5mg  q8 prn  -continued gabapentin 300mg  at 2100 for sleep, restless leg sx  - tramadol 25-50mg  q6 prn  -consider scheduled baclofen, but don't want to increase med burden much more   4. Hypertension. Lopressor 25 mg twice a day. Generally fair control. Still having periodic elevation since CVA. 5. Neuropsych: This patient is not yet capable of making decisions on her own behalf. 6. Skin/Wound Care: Routine skin checks and local care to surgical site. Encourage adequate nutrition 7. Fluids/Electrolytes/Nutrition:  replacing potassium  -intake actually has been reasonable  -sodium stable at 130 8. Hyperlipidemia. Pravachol 9. Neurogenic bladder:  Urine with  100k e coli---sens to amoxil   -I/O cath prn---may need to reinsert  foley   -low dose flomax also--HS 10. Sleep---schedule trazodone 25mg  qhs  -have ordered sleep chart to firmly document sleep pattern   LOS (Days) 11 A FACE TO FACE EVALUATION WAS PERFORMED  SWARTZ,ZACHARY T 01/31/2015 8:34 AM

## 2015-01-31 NOTE — Progress Notes (Signed)
Speech Language Pathology Daily Session Note  Patient Details  Name: Angela Keith MRN: [id number] Date of Birth: Nov 21, 1929  Today's Date: 01/31/2015 SLP Individual Time: 0800-0900 SLP Individual Time Calculation (min): 60 min  Short Term Goals: Week 2: SLP Short Term Goal 1 (Week 2): Patient will attend to left field of enviornment and body during familiar structured tasks with Min multimodal cues  SLP Short Term Goal 2 (Week 2): Patient will select attention to basic, familar tasks for 45 minutes with Min multimodal cues  SLP Short Term Goal 3 (Week 2): Patient will utilize call bell to request assistance in 50% of observable opportunities SLP Short Term Goal 4 (Week 2): Patient will recall new, daily information with Min A multimodal cues for use of external aids.  SLP Short Term Goal 5 (Week 2): Patient will demonstrate functional problem solving for basic and familiar tasks with Min A multimodal cues.   Skilled Therapeutic Interventions: Skilled treatment session focused on cognitive goals.  Upon arrival, patient was awake while supine in bed and consuming breakfast. Patient independently donned her right hearing aid and required Mod A to donn her left hearing aid with use of her [name].  Patient independently used the call bell to request additional assistance for transfer to wheelchair. Patient was transferred to wheelchair with +2 assist for safety and required Mod A multimodal cues for awareness of poor positioning and lateral lean to the left while upright in the wheelchair. Patient performed self-care tasks at the sink with Mod A verbal cues and extra time for functional problem solving and attention to [name] with tasks.  Patient participated in a 4 step picture sequencing task and required Min A problem solving and attention to left field of environment with task. Patient also demonstrated selective attention to task for 30 minutes with supervision verbal cues. Patient left upright in  wheelchair with quick release belt in place and all needs within reach. Continue with current plan of care.      FIM:  Comprehension Comprehension Mode: Auditory Comprehension: 5-Understands basic 90% of the time/requires cueing < 10% of the time Expression Expression Mode: Verbal Expression: 4-Expresses basic 75 - 89% of the time/requires cueing 10 - 24% of the time. Needs helper to occlude trach/needs to repeat words. Social Interaction Social Interaction: 4-Interacts appropriately 75 - 89% of the time - Needs redirection for appropriate language or to initiate interaction. Problem Solving Problem Solving: 4-Solves basic 75 - 89% of the time/requires cueing 10 - 24% of the time Memory Memory: 4-Recognizes or recalls 75 - 89% of the time/requires cueing 10 - 24% of the time  Pain Pain Assessment Pain Assessment: No/denies pain  Therapy/Group: Individual Therapy  Angela Keith 01/31/2015, 2:12 PM

## 2015-01-31 NOTE — Discharge Instructions (Signed)
Inpatient Rehab Discharge Instructions  [facility] Discharge date and time: No discharge date for patient encounter.   Activities/Precautions/ Functional Status: Activity: activity as tolerated Diet: regular diet Wound Care: none needed Functional status:  ___ No restrictions     ___ Walk up steps independently ___ 24/7 supervision/assistance   ___ Walk up steps with assistance ___ Intermittent supervision/assistance  ___ Bathe/dress independently ___ Walk with walker     ___ Bathe/dress with assistance ___ Walk Independently    ___ Shower independently _x__ Walk with assistance    ___ Shower with assistance ___ No alcohol     ___ Return to work/school ________  Special Instructions: Back brace for comfort only STROKE/TIA DISCHARGE INSTRUCTIONS SMOKING Cigarette smoking nearly doubles your risk of having a stroke & is the single most alterable risk factor  If you smoke or have smoked in the last 12 months, you are advised to quit smoking for your health.  Most of the excess cardiovascular risk related to smoking disappears within a year of stopping.  Ask you doctor about anti-smoking medications  [place] Quit Line: 1-800-QUIT NOW  Free Smoking Cessation Classes [phone]  CHOLESTEROL Know your levels; limit fat & cholesterol in your diet  Lipid Panel     Component Value Date/Time   CHOL 122 01/19/2015 0452   TRIG 20 01/19/2015 0452   HDL 52 01/19/2015 0452   CHOLHDL 2.3 01/19/2015 0452   VLDL 4 01/19/2015 0452   LDLCALC 66 01/19/2015 0452      Many patients benefit from treatment even if their cholesterol is at goal.  Goal: Total Cholesterol (CHOL) less than 160  Goal:  Triglycerides (TRIG) less than 150  Goal:  HDL greater than 40  Goal:  LDL (LDLCALC) less than 100   BLOOD PRESSURE American Stroke Association blood pressure target is less that 120/80 mm/Hg  Your discharge blood pressure is:  BP: (!) 141/75 mmHg  Monitor your blood pressure  Limit your  salt and alcohol intake  Many individuals will require more than one medication for high blood pressure  DIABETES (A1c is a blood sugar average for last 3 months) Goal HGBA1c is under 7% (HBGA1c is blood sugar average for last 3 months)  Diabetes: No known diagnosis of diabetes    Lab Results  Component Value Date   HGBA1C 6.1* 01/19/2015     Your HGBA1c can be lowered with medications, healthy diet, and exercise.  Check your blood sugar as directed by your physician  Call your physician if you experience unexplained or low blood sugars.  PHYSICAL ACTIVITY/REHABILITATION Goal is 30 minutes at least 4 days per week  Activity: Increase activity slowly, Therapies: Physical Therapy: Home Health Return to work:   Activity decreases your risk of heart attack and stroke and makes your heart stronger.  It helps control your weight and blood pressure; helps you relax and can improve your mood.  Participate in a regular exercise program.  Talk with your doctor about the best form of exercise for you (dancing, walking, swimming, cycling).  DIET/WEIGHT Goal is to maintain a healthy weight  Your discharge diet is: Diet Heart Room service appropriate?: Yes; Fluid consistency:: Thin  liquids Your height is:    Your current weight is: Weight: 72.4 kg (159 lb 9.8 oz) Your Body Mass Index (BMI) is:     Following the type of diet specifically designed for you will help prevent another stroke.  Your goal weight range is:    Your goal  Body Mass Index (BMI) is 19-24.  Healthy food habits can help reduce 3 risk factors for stroke:  High cholesterol, hypertension, and excess weight.  RESOURCES Stroke/Support Group:  Call [phone]   STROKE EDUCATION PROVIDED/REVIEWED AND GIVEN TO PATIENT Stroke warning signs and symptoms How to activate emergency medical system (call 911). Medications prescribed at discharge. Need for follow-up after discharge. Personal risk factors for stroke. Pneumonia  vaccine given:  Flu vaccine given:  My questions have been answered, the writing is legible, and I understand these instructions.  I will adhere to these goals & educational materials that have been provided to me after my discharge from the hospital.       My questions have been answered and I understand these instructions. I will adhere to these goals and the provided educational materials after my discharge from the hospital.  Patient/Caregiver Signature _______________________________ Date __________  Clinician Signature _______________________________________ Date __________  Please bring this form and your medication list with you to all your follow-up doctor's appointments.

## 2015-01-31 NOTE — Progress Notes (Signed)
Occupational Therapy Session Note  Patient Details  Name: Angela Keith MRN: [id number] Date of Birth: Mar 22, 1930  Today's Date: 01/31/2015 OT Individual Time: 0900-1030 OT Individual Time Calculation (min): 90 min    Short Term Goals: Week 2:  OT Short Term Goal 1 (Week 2): Pt will complete bathing seated with mod assist and min cues (verbal, environmental or gestural) to maintain neutral sitting posture. OT Short Term Goal 2 (Week 2): Pt will demo ability to complete stand-pivot transfer, bed<>w/c with mod assist OT Short Term Goal 3 (Week 2): Pt will improve functional use of left hand during BADL with min assist to release grasp OT Short Term Goal 4 (Week 2): Pt will dress upper body with min assist OT Short Term Goal 5 (Week 2): Pt will dress lower body with mod assist  Skilled Therapeutic Interventions/Progress Updates: ADL-retraining (30 min) with focus on bed mobility, transfers, left side weight-shifting, lower body dressing (shoes), and seated grooming.   Pt received after RN care to catheterize to void urine. Pt received supine in bed dressed with spinal brace already in place.   Pt completed bed mobility, rolling to her right and rising to edge of bed with mod assist to initiate left leg flexion at hip and knee, and to assist with lifting trunk off bed (pt=50%).  Pt transferred to w/c using stand-pivot transfer with min assist to initiate sit>stand and max assist to complete standing upright with manual facilitation to lift her head and trunk.   Pt lowers to w/c with mod assist (pt=70%) and requires mod assist reposition left hip when seated.   After setup to provide shoes and footstool pt donned shoes with overall mod assist for each shoe.   Pt was then escorted to gym and then to [facility] room to participate in [name] therex and NMR activities, reaching to her right with her left hand to grasp and release objects (cones, letters, game pieces).   Pt completed 5 min of UE ergometry with mod  assist to maintain postural control and manual contact to facilitate left elbow flexion/extension.    Pt required frequent verbal cues this session due to delayed response to directions.    Pt demo'd persistence (sustained attention) to grasp/release activities with improvements noted specifically during manipulation of game pieces placed in vertical plane.        Therapy Documentation Precautions:  Precautions Precautions: Fall, Back Precaution Comments: daughter brought in lumbar corset from home for comfort, L inattention Restrictions Weight Bearing Restrictions: No  Pain: Pain Assessment Pain Assessment: No/denies pain    See FIM for current functional status  Therapy/Group: Individual Therapy  Idrissa Beville 01/31/2015, 10:46 AM

## 2015-01-31 NOTE — Progress Notes (Signed)
Physical Therapy Session Note  Patient Details  Name: Angela Keith MRN: [id number] Date of Birth: 05-19-1930  Today's Date: 01/31/2015 PT Individual Time: 1100-1158 PT Individual Time Calculation (min): 58 min   Short Term Goals: Week 1:  PT Short Term Goal 1 (Week 1): Pt will transfer supine to edge of bed, edge of bed to supine with max A.  PT Short Term Goal 1 - Progress (Week 1): Met PT Short Term Goal 2 (Week 1): Pt will transfer bed to chair, chair to bed with max A.  PT Short Term Goal 2 - Progress (Week 1): Met PT Short Term Goal 3 (Week 1): Pt will ambulate about 25 feet with LRAD and max A.  PT Short Term Goal 3 - Progress (Week 1): Progressing toward goal PT Short Term Goal 4 (Week 1): Pt will propel w/c about 75 feet with min A.  PT Short Term Goal 4 - Progress (Week 1): Progressing toward goal PT Short Term Goal 5 (Week 1): Pt will ascend/descend 2 stairs with 1 rail and max A.  PT Short Term Goal 5 - Progress (Week 1): Progressing toward goal Week 2:  PT Short Term Goal 1 (Week 2): Pt will ambulate about 25 feet with LRAD and max A.  PT Short Term Goal 2 (Week 2): Pt will propel w/c about 75 feet with min A.  PT Short Term Goal 3 (Week 2): Pt will ascend/descend 2 stairs with 1 rail and max A.  PT Short Term Goal 4 (Week 2): Pt will consistently sit to stand req mod A.  PT Short Term Goal 5 (Week 2): Pt will demonstrate static sit balance x 30 seconds with min A.   Skilled Therapeutic Interventions/Progress Updates:   Pt received in recliner.  Pt cued to advance to edge of recliner; pt initiate scooting forwards with mod A.  Performed squat pivot recliner >w/c to R with max A over the back technique for postural control and weight shifting.  In hallway performed NMR; see below for details.  Performed w/c mobility training x 20' in hallway with R hemi technique with mod A and cues for continued LE propulsion.  Assessed ability to transfer w/c > recliner with Stedy; continued  to require max A of two people for [name] attention and placement, sequencing of sit <> stand and for postural control.  Not recommending use of Stedy at this time for nursing.  Pt set up in recliner with all items within reach and quick release belt in place.     Therapy Documentation Precautions:  Precautions Precautions: Fall, Back Precaution Comments: daughter brought in lumbar corset from home for comfort, L inattention Restrictions Weight Bearing Restrictions: No Pain:  no c/o pain Locomotion : Ambulation Ambulation/Gait Assistance: 1: +1 Total assist Wheelchair Mobility Distance: 20  Other Treatments: Treatments Neuromuscular Facilitation: Left;Lower Extremity;Activity to increase coordination;Activity to increase motor control;Activity to increase timing and sequencing;Activity to increase grading;Activity to increase sustained activation;Activity to increase lateral weight shifting;Activity to increase anterior-posterior weight shifting during gait x 25' x 2 and during dynamic standing balance training during LLE ball kicks with RUE support on wall rail and therapist on L side providing facilitation for upright trunk/postural control and head positioning, lateral and anterior weight shifting, initiation of trunk rotation and LLE advancement-pt able to initiate knee extension and heel strike with facilitation of hip flexion (ace wrap for DF assist); also required tactile facilitation of approximation of L hip and knee for extension in stance.  See FIM for current functional status  Therapy/Group: Individual Therapy  Angela Keith 01/31/2015, 12:12 PM

## 2015-02-01 ENCOUNTER — Inpatient Hospital Stay (HOSPITAL_COMMUNITY): Payer: Medicare Other | Admitting: Occupational Therapy

## 2015-02-01 ENCOUNTER — Inpatient Hospital Stay (HOSPITAL_COMMUNITY): Payer: Medicare Other | Admitting: Speech Pathology

## 2015-02-01 ENCOUNTER — Inpatient Hospital Stay (HOSPITAL_COMMUNITY): Payer: Medicare Other | Admitting: Physical Therapy

## 2015-02-01 DIAGNOSIS — N390 Urinary tract infection, site not specified: Secondary | ICD-10-CM

## 2015-02-01 DIAGNOSIS — A499 Bacterial infection, unspecified: Secondary | ICD-10-CM

## 2015-02-01 DIAGNOSIS — N319 Neuromuscular dysfunction of bladder, unspecified: Secondary | ICD-10-CM

## 2015-02-01 NOTE — Progress Notes (Signed)
Physical Therapy Session Note  Patient Details  Name: Angela Keith MRN: [id number] Date of Birth: 10-11-1929  Today's Date: 02/01/2015 PT Individual Time:  -  1100-1200 Treatment Time: 60 min     Short Term Goals: Week 2:  PT Short Term Goal 1 (Week 2): Pt will ambulate about 25 feet with LRAD and max A.  PT Short Term Goal 2 (Week 2): Pt will propel w/c about 75 feet with min A.  PT Short Term Goal 3 (Week 2): Pt will ascend/descend 2 stairs with 1 rail and max A.  PT Short Term Goal 4 (Week 2): Pt will consistently sit to stand req mod A.  PT Short Term Goal 5 (Week 2): Pt will demonstrate static sit balance x 30 seconds with min A.   Skilled Therapeutic Interventions/Progress Updates:    Therapeutic Activity: PT instructs pt in rolling L in bed req mod A, L side lie to sit transfer req mod A, sit to stand req mod-max A, and stand-pivot transfer req mod A.   W/C Management: PT instructs pt in w/c propulsion using R UE/LE technique req visual cues for direction - most SBA, but one episode of min A due to pt getting close to hitting L side of wall x 150'.   Gait Training: PT instructs pt in ambulation with R HW and trial L AFO x 65' req max A from PT and +2 for w/c follow for safety - PT provides sequencing verbal/tactile cues, assist with weight shift towards R (pt pushes L), progression of L LE, stabilization of L knee in stance.  PT instructs pt in ambulation with RW and L hand splint x 20' req max A to prevent L knee from buckling with +2 for w/c follow - pt req more assist with L LE than can be provided with use of RW (lateral leg supports interfere with PT ability to assist L LE).   Pt req continued use of R HW at this time for progression of gait. W/C Propulsion and bed mobility are slowly improving. Continue per PT POC.    Therapy Documentation Precautions:  Precautions Precautions: Fall, Back Precaution Comments: daughter brought in lumbar corset from home for comfort, L  inattention Restrictions Weight Bearing Restrictions: No Pain: Pain Assessment Pain Assessment: Faces Pain Score: 4  Pain Type: Surgical pain Pain Location: Back Pain Orientation: Lower Pain Onset: On-going Pain Intervention(s): Rest;Repositioned Multiple Pain Sites: No  See FIM for current functional status  Therapy/Group: Individual Therapy  Legaci Tarman M 02/01/2015, 8:58 AM

## 2015-02-01 NOTE — Progress Notes (Signed)
Social Work Patient ID: [name], female   DOB: [birth year], 81 y.o.   MRN: [id number]   Nigel Sloop, LCSW Social Worker Signed  Patient Care Conference 02/01/2015  2:18 PM    Expand All Collapse All   Inpatient RehabilitationTeam Conference and Plan of Care Update Date: 01/31/2015   Time: 2:00 PM     Patient Name: Angela Keith       Medical Record Number: [id number]  Date of Birth: 1929-12-30 Sex: Female         Room/Bed: 4W23C/4W23C-01 Payor Info: Payor: MEDICARE / Plan: MEDICARE PART A AND B / Product Type: *No Product type* /    Admitting Diagnosis: lumbar decomp sullivan   Admit Date/Time:  01/20/2015  4:28 PM Admission Comments: No comment available   Primary Diagnosis:  Cerebral infarction due to thrombosis of cerebral artery Principal Problem: Cerebral infarction due to thrombosis of cerebral artery    Patient Active Problem List     Diagnosis  Date Noted   .  Left hemiparesis     .  Cryptogenic stroke     .  H/O TIA (transient ischemic attack) and stroke     .  Spinal stenosis, lumbar region, with neurogenic claudication  01/17/2015   .  Lumbar stenosis  01/17/2015   .  Hemispheric carotid artery syndrome  11/10/2014   .  Essential hypertension  11/10/2014   .  HLD (hyperlipidemia)  11/10/2014   .  Back pain with left-sided radiculopathy  11/08/2014   .  Cerebral infarction due to thrombosis of cerebral artery     .  Hyperlipidemia LDL goal <70  09/07/2014   .  Presumed CVA (cerebral infarction) s/p IV tPA, MRI negative  09/05/2014   .  Hypertensive urgency  09/05/2014   .  Right bundle branch block  03/21/2014   .  Cough  10/31/2012   .  Nausea vomiting and diarrhea  10/30/2012   .  Elevated lipase  10/30/2012   .  HTN (hypertension)  10/30/2012   .  Unspecified hypothyroidism  10/30/2012     Expected Discharge Date: Expected Discharge Date: 02/14/15 (vs. SNF)  Team Members Present: Physician leading conference: Dr. Faith Rogue Social Worker  Present: Staci Acosta, LCSW Nurse Present: Carmie End, RN PT Present: Edman Circle, PT OT Present: Donzetta Kohut, OT;Other (comment) Johnsie Cancel, OT) Other (Discipline and Name): Ottie Glazier, RN [facility]);  Cheri Guppy, RN PPS Coordinator present : Tora Duck, RN, [facility]        Current Status/Progress  Goal  Weekly Team Focus   Medical     slow progress. working on sleep. sleep chart/sleep improving. retaining urine, uti rx'ed  increase engagement, arousal  sleep, pain, family edcuation   Bowel/Bladder     Continent of bowel as of LBM 7/24; I&O carh q 6 hr.   Mod assist with bowel and bladder  timed toileting q 2hr. for bowel and bladder    Swallow/Nutrition/ Hydration               ADL's     Overall Mod A for upper body ADL and Max A for lower body; Max A for transfers  Min A overall for assisted BADL and transfers   neuro re-education, trunk control, initiaton, visual attention to left, transfers, sit to stands, stand-pivot transfers    Mobility     Max A transfers, mod A w/c mobility, +2 for gait  min A overall w/c level and short distance  gait; may need to downgrade goals due to slow progress  NMR for trunk and postural control, motor planning and initiation, transfers, gait, w/c mobility   Communication               Safety/Cognition/ Behavioral Observations    Mod A  Min A  attention, recall, attention to left, problem solving, awareness     Pain     BLE spasms, Low back pain.  Flexeril TID PRN, 25-50 mg. Ultram q 6hr PRN  <4 on a 0-10 scale  assess pain q 4hr and medicate as needed    Skin     incision to back with honeycomb dressing   remain free from infection/breakdown while on rehab   assess skin q shift    Rehab Goals Patient on target to meet rehab goals: Yes Rehab Goals Revised: many of pt's goals have been downgraded to moderate assistance *See Care Plan and progress notes for long and short-term goals.    Barriers to Discharge:  profound deficits, hearing,  family expectations     Possible Resolutions to Barriers:   family ed, supervision at home, NMR      Discharge Planning/Teaching Needs:   Pt wants to return to her home at d/c, but her goals are mod assist now.  CSW will be meeting with pt and her family to help them discern what the next step for pt's care will be.  Family is able to come for family education, if they chose to take pt home.    Team Discussion:    Pt's goals were downgraded to moderate assistance.  Currently she is maximum assistance with constant cueing.  Pt also requiring in and out caths vs. foley and MD is working with nursing on this.  Pt does well with ST when she can hear and can talk back to therapists.  Her hearing is a big barrier.   Revisions to Treatment Plan:    Pt's goals downgraded.    Continued Need for Acute Rehabilitation Level of Care: The patient requires daily medical management by a physician with specialized training in physical medicine and rehabilitation for the following conditions: Daily direction of a multidisciplinary physical rehabilitation program to ensure safe treatment while eliciting the highest outcome that is of practical value to the patient.: Yes Daily medical management of patient stability for increased activity during participation in an intensive rehabilitation regime.: Yes Daily analysis of laboratory values and/or radiology reports with any subsequent need for medication adjustment of medical intervention for : Neurological problems;Other  Jayline Kilburg, [place]ck 02/01/2015, 2:19 PM

## 2015-02-01 NOTE — Progress Notes (Signed)
Social Work Patient ID: Angela Keith, female   DOB: [birth year], 81 y.o.   MRN: [id number]   CSW attempted to talk with pt and family together to discuss pt's d/c plan, but pt's family was out of town and just returning today, per dtr-in-law.  CSW plans to talk with them tomorrow to determine what is the next best step for pt.

## 2015-02-01 NOTE — Patient Care Conference (Signed)
Inpatient RehabilitationTeam Conference and Plan of Care Update Date: 01/31/2015   Time: 2:00 PM    Patient Name: Angela Keith      Medical Record Number: [id number]  Date of Birth: 1930-04-27 Sex: Female         Room/Bed: 4W23C/4W23C-01 Payor Info: Payor: MEDICARE / Plan: MEDICARE PART A AND B / Product Type: *No Product type* /    Admitting Diagnosis: lumbar decomp sullivan  Admit Date/Time:  01/20/2015  4:28 PM Admission Comments: No comment available   Primary Diagnosis:  Cerebral infarction due to thrombosis of cerebral artery Principal Problem: Cerebral infarction due to thrombosis of cerebral artery  Patient Active Problem List   Diagnosis Date Noted  . Left hemiparesis   . Cryptogenic stroke   . H/O TIA (transient ischemic attack) and stroke   . Spinal stenosis, lumbar region, with neurogenic claudication 01/17/2015  . Lumbar stenosis 01/17/2015  . 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 Essential hypertension 11/10/2014  . HLD (hyperlipidemia) 11/10/2014  . Back pain with left-sided radiculopathy 11/08/2014  . Cerebral infarction due to thrombosis of cerebral artery   . Hyperlipidemia LDL goal <70 09/07/2014  . Presumed CVA (cerebral infarction) s/p IV tPA, MRI negative 09/05/2014  . Hypertensive urgency 09/05/2014  . Right bundle branch block 03/21/2014  . Cough 10/31/2012  . Nausea vomiting and diarrhea 10/30/2012  . Elevated lipase 10/30/2012  . HTN (hypertension) 10/30/2012  . Unspecified hypothyroidism 10/30/2012    Expected Discharge Date: Expected Discharge Date: 02/14/15 (vs. SNF)  Team Members Present: Physician leading conference: Dr. Faith Rogue Social Worker Present: Staci Acosta, LCSW Nurse Present: Carmie End, RN PT Present: Edman Circle, PT OT Present: Donzetta Kohut, OT;Other (comment) Johnsie Cancel, OT) Other (Discipline and Name): Ottie Glazier, RN [facility]);  Cheri Guppy, RN PPS Coordinator present : Tora Duck, RN, [facility]   Current Status/Progress Goal Weekly Team Focus  Medical   slow progress. working on sleep. sleep chart/sleep improving. retaining urine, uti rx'ed  increase engagement, arousal  sleep, pain, family edcuation   Bowel/Bladder   Continent of bowel as of LBM 7/24; I&O carh q 6 hr.   Mod assist with bowel and bladder  timed toileting q 2hr. for bowel and bladder   Swallow/Nutrition/ Hydration             ADL's   Overall Mod A for upper body ADL and Max A for lower body; Max A for transfers  Min A overall for assisted BADL and transfers  neuro re-education, trunk control, initiaton, visual attention to left, transfers, sit to stands, stand-pivot transfers    Mobility   Max A transfers, mod A w/c mobility, +2 for gait  min A overall w/c level and short distance gait; may need to downgrade goals due to slow progress  NMR for trunk and postural control, motor planning and initiation, transfers, gait, w/c mobility   Communication             Safety/Cognition/ Behavioral Observations  Mod A  Min A  attention, recall, attention to left, problem solving, awareness    Pain   BLE spasms, Low back pain. 5mg  Flexeril TID PRN, 25-50 mg. Ultram q 6hr PRN  <4 on a 0-10 scale  assess pain q 4hr and medicate as needed   Skin   incision to back with honeycomb dressing  remain free from infection/breakdown while on rehab  assess skin q shift    Rehab Goals Patient on target to meet rehab goals: Yes  Rehab Goals Revised: many of pt's goals have been downgraded to moderate assistance *See Care Plan and progress notes for long and short-term goals.  Barriers to Discharge: profound deficits, hearing, family expectations    Possible Resolutions to Barriers:  family ed, supervision at home, NMR    Discharge Planning/Teaching Needs:  Pt wants to return to her home at d/c, but her goals are mod assist now.  CSW will be meeting with pt and her family to help them discern what the next step for pt's care will  be.  Family is able to come for family education, if they chose to take pt home.   Team Discussion:  Pt's goals were downgraded to moderate assistance.  Currently she is maximum assistance with constant cueing.  Pt also requiring in and out caths vs. foley and MD is working with nursing on this.  Pt does well with ST when she can hear and can talk back to therapists.  Her hearing is a big barrier.  Revisions to Treatment Plan:  Pt's goals downgraded.   Continued Need for Acute Rehabilitation Level of Care: The patient requires daily medical management by a physician with specialized training in physical medicine and rehabilitation for the following conditions: Daily direction of a multidisciplinary physical rehabilitation program to ensure safe treatment while eliciting the highest outcome that is of practical value to the patient.: Yes Daily medical management of patient stability for increased activity during participation in an intensive rehabilitation regime.: Yes Daily analysis of laboratory values and/or radiology reports with any subsequent need for medication adjustment of medical intervention for : Neurological problems;Other  Janene Yousuf, [place] 02/01/2015, 2:19 PM

## 2015-02-01 NOTE — Progress Notes (Addendum)
[facility] PHYSICAL MEDICINE & REHABILITATION     PROGRESS NOTE    Subjective/Complaints: No issues overnight. Patient working hard in therapies but generally slow progress. Still not emptying bladder ROS: limited due to cognition, hearing, language   Objective: Vital Signs: Blood pressure 121/59, pulse 71, temperature 97.8 F (36.6 C), temperature source Oral, resp. rate 18, weight 72.4 kg (159 lb 9.8 oz), SpO2 95 %. No results found.  Recent Labs  01/31/15 0519  WBC 9.0  HGB 12.1  HCT 36.8  PLT 373    Recent Labs  01/31/15 0519  NA 134*  K 4.5  CL 100*  GLUCOSE 106*  BUN 13  CREATININE 1.08*  CALCIUM 9.5   CBG (last 3)  No results for input(s): GLUCAP in the last 72 hours.  Wt Readings from Last 3 Encounters:  01/27/15 72.4 kg (159 lb 9.8 oz)  01/17/15 71.215 kg (157 lb)  01/11/15 71.385 kg (157 lb 6 oz)    Physical Exam:  Constitutional:  Snoring, difficult to arouse Eyes: EOM are normal.  Mouth: dentition fair, oral mucosa moist Neck: Normal range of motion. Neck supple. No thyromegaly present.  Cardiovascular: Normal rate and regular rhythm.no murmurs or rubs  Respiratory: Effort normal and breath sounds normal. No respiratory distress. no wheezes GI: Soft. Bowel sounds are normal. She exhibits no distension.  Neurological: She is alert and attentive.  HOH with limited insight and awareness. [name] spastic (1+/4) with underlying 1-2/5 strength proximal to distal--apraxia.Marland Kitchen LLE:  Tone 1+ to 2/4 today hamstrings and calf. MMT: 1-2 hf, 2- ke and 1 adf/apf. persistent inattention to left. DTR's 1+ right and left. No visible spasms or clonus Skin: Skin is warm and dry.  Psychiatric: She has a normal mood and affect   Assessment/Plan: 1. Functional deficits secondary to right ACA and left thalamic infarcts after recent lumbar decompression which require 3+ hours per day of interdisciplinary therapy in a comprehensive inpatient rehab setting. Physiatrist  is providing close team supervision and 24 hour management of active medical problems listed below. Physiatrist and rehab team continue to assess barriers to discharge/monitor patient progress toward functional and medical goals. FIM: FIM - Bathing Bathing Steps Patient Completed: Chest, Left Arm, Abdomen, Front perineal area, Right upper leg, Left upper leg Bathing: 3: Mod-Patient completes 5-7 24f 10 parts or 50-74%  FIM - Upper Body Dressing/Undressing Upper body dressing/undressing steps patient completed: Thread/unthread right sleeve of pullover shirt/dresss, Thread/unthread left sleeve of pullover shirt/dress Upper body dressing/undressing: 3: Mod-Patient completed 50-74% of tasks FIM - Lower Body Dressing/Undressing Lower body dressing/undressing: 2: Max-Patient completed 25-49% of tasks  FIM - Toileting Toileting steps completed by patient: Adjust clothing prior to toileting Toileting Assistive Devices: Grab bar or rail for support Toileting: 2: Max-Patient completed 1 of 3 steps  FIM - Diplomatic Services operational officer Devices: Grab bars Toilet Transfers: 2-To toilet/BSC: Max A (lift and lower assist), 2-From toilet/BSC: Max A (lift and lower assist)  FIM - Banker Devices: Orthosis, Bed rails, Arm rests Bed/Chair Transfer: 2: Bed > Chair or W/C: Max A (lift and lower assist), 2: Chair or W/C > Bed: Max A (lift and lower assist)  FIM - Locomotion: Wheelchair Distance: 20 Locomotion: Wheelchair: 1: Travels less than 50 ft with moderate assistance (Pt: 50 - 74%) FIM - Locomotion: Ambulation Locomotion: Ambulation Assistive Devices: Other (comment) (wall rail) Ambulation/Gait Assistance: 1: +1 Total assist Locomotion: Ambulation: 1: Two helpers  Comprehension Comprehension Mode: Auditory Comprehension: 5-Understands basic  90% of the time/requires cueing < 10% of the time  Expression Expression Mode: Verbal Expression:  4-Expresses basic 75 - 89% of the time/requires cueing 10 - 24% of the time. Needs helper to occlude trach/needs to repeat words.  Social Interaction Social Interaction: 4-Interacts appropriately 75 - 89% of the time - Needs redirection for appropriate language or to initiate interaction.  Problem Solving Problem Solving: 4-Solves basic 75 - 89% of the time/requires cueing 10 - 24% of the time  Memory Memory Mode: Not assessed Memory: 4-Recognizes or recalls 75 - 89% of the time/requires cueing 10 - 24% of the time  Medical Problem List and Plan: 1. Functional deficits secondary to status post lumbar decompression 01/17/2015 with postoperative right ACA, left thalamic infarcts.Has left hemiparesis Status post loop recorder -continue therapies---slow progress---goal is still to get home with family -no further feedback from family after incidents Sunday night 2. DVT Prophylaxis/Anticoagulation: continue subcutaneous heparin indicated given VTE risk. No bleeding sequelae, platelets normal-dopplers negative 3. Pain Management:    -continued tone in LLE  -continue flexeril  q8 prn  -continue gabapentin  at 2100 for sleep, restless leg sx  - tramadol 25-50mg  q6 prn  -?reintroduce baclofen if tone worsens   4. Hypertension. Lopressor 25 mg twice a day. Generally fair control at present 5. Neuropsych: This patient is not yet capable of making decisions on her own behalf. 6. Skin/Wound Care: Routine skin checks and local care to surgical site. Encourage adequate nutrition 7. Fluids/Electrolytes/Nutrition: continue potassium supplement  -intake actually has been reasonable  -sodium up to 134. Rest of labs reviewed and improved 8. Hyperlipidemia. Pravachol 9. Neurogenic bladder:  Urine with  100k e coli s/p amoxil  -still not emptying---replace foley today   -continue low dose flomax also--HS 10. Sleep---schedule trazodone  qhs  -continue sleep chart to firmly document sleep  pattern   LOS (Days) 12 A FACE TO FACE EVALUATION WAS PERFORMED  SWARTZ,ZACHARY T 02/01/2015 8:14 AM

## 2015-02-01 NOTE — Progress Notes (Signed)
Occupational Therapy Session Note  Patient Details  Name: Angela Keith MRN: [id number] Date of Birth: 1929-12-29  Today's Date: 02/01/2015 OT Individual Time: 0905-1002 OT Individual Time Calculation (min): 57 min    Short Term Goals: Week 2:  OT Short Term Goal 1 (Week 2): Pt will complete bathing seated with mod assist and min cues (verbal, environmental or gestural) to maintain neutral sitting posture. OT Short Term Goal 2 (Week 2): Pt will demo ability to complete stand-pivot transfer, bed<>w/c with mod assist OT Short Term Goal 3 (Week 2): Pt will improve functional use of left hand during BADL with min assist to release grasp OT Short Term Goal 4 (Week 2): Pt will dress upper body with min assist OT Short Term Goal 5 (Week 2): Pt will dress lower body with mod assist  Skilled Therapeutic Interventions/Progress Updates:    Pt performed bathing and dressing sit to stand at the sink.  Pt very HOH throughout session, even with bilateral hearing aides present.  She was able to perform UB bathing with min assist for guiding the [name] up to the right shoulder and axilla region to wash.  She demonstrates decreased motor planning with the [name] resulting in decreased ability to let go of objects once she grabs them  Therapist at times would have to help her release the washcloth in order to reposition in the left hand.  Increased LOB to the left side in sitting as well as posteriorly requiring max assist to maintain sitting balance unsupported edge of chair with bathing.  Max assist also needed for sit to stand and standing when attempting all LB selfcare.  Pt with decreased activation of the left hip and knee to maintain standing, thus needing max facilitation.  Pt also maintaining flexed trunk and head with standing.  Assisted pt with crossing the LLE over the right knee and maintaining to wash her foot and to assist with removal of gripper socks as well.  Max demonstrational cueing for all dressing  following hemi techniques.  Pt transferred back to bed at end of session with max assist stand pivot in order for nursing to apply catheter.    Therapy Documentation Precautions:  Precautions Precautions: Fall, Back Precaution Comments: daughter brought in lumbar corset from home for comfort, L inattention, pusher to the left Restrictions Weight Bearing Restrictions: No  Pain: Pain Assessment Pain Assessment: Faces Pain Score: 4  Pain Type: Surgical pain Pain Location: Back Pain Orientation: Lower Pain Onset: On-going Pain Intervention(s): Rest;Repositioned Multiple Pain Sites: No ADL: See FIM for current functional status  Therapy/Group: Individual Therapy  Henrry Feil OTR/L 02/01/2015, 12:23 PM

## 2015-02-01 NOTE — Progress Notes (Signed)
Occupational Therapy Session Note  Patient Details  Name: Angela Keith MRN: [id number] Date of Birth: [birth year]  Today's Date: 02/01/2015 OT Individual Time: 1445-1515 OT Individual Time Calculation (min): 30 min    Short Term Goals: Week 2:  OT Short Term Goal 1 (Week 2): Pt will complete bathing seated with mod assist and min cues (verbal, environmental or gestural) to maintain neutral sitting posture. OT Short Term Goal 2 (Week 2): Pt will demo ability to complete stand-pivot transfer, bed<>w/c with mod assist OT Short Term Goal 3 (Week 2): Pt will improve functional use of left hand during BADL with min assist to release grasp OT Short Term Goal 4 (Week 2): Pt will dress upper body with min assist OT Short Term Goal 5 (Week 2): Pt will dress lower body with mod assist  Skilled Therapeutic Interventions/Progress Updates:    Pt seen for OT therapy session focusing on neuromuscular re-education with emphasis on weight shifting, reaching, and grasp/ release. Pt in w/c upon arrival, agreeable to tx. Pt taken to small gym in order to work in quiet non-stimulating environment. Pt worked on grasp and release of small rings required to reach and move rings from L side to R side using L UE. Pt required stabilizing assist at L elbow to assist with extension of elbow and required manual facilitation for releasing of objects. Pt demonstrated ability to come off back of w/c back rest independently due to functional core strength. Pt then required to grab small football from therapist when ball placed in various planes facilitating weight shift with pt able to grasp/ release with increased time and concentration, completed x12 reps in all planes. Pt then completed towel push exercises completed anterior <> posterior, and to R<>L with L hand on bottom and R hand on top, required to come forward at hip and range B UEs with assist provided at L elbow.  Pt returned to room at end of session, all needs in  reach.   Therapy Documentation Precautions:  Precautions Precautions: Fall, Back Precaution Comments: daughter brought in lumbar corset from home for comfort, L inattention Restrictions Weight Bearing Restrictions: No Pain: Pain Assessment Pain Score: No/ denied pain  See FIM for current functional status  Therapy/Group: Individual Therapy  Lewis, Laretta Pyatt C 02/01/2015, 7:18 AM

## 2015-02-01 NOTE — Progress Notes (Signed)
Speech Language Pathology Daily Session Note  Patient Details  Name: Angela Keith MRN: [id number] Date of Birth: 1929/08/17  Today's Date: 02/01/2015 SLP Individual Time: 0800-0900 SLP Individual Time Calculation (min): 60 min  Short Term Goals: Week 2: SLP Short Term Goal 1 (Week 2): Patient will attend to left field of enviornment and body during familiar structured tasks with Min multimodal cues  SLP Short Term Goal 2 (Week 2): Patient will select attention to basic, familar tasks for 45 minutes with Min multimodal cues  SLP Short Term Goal 3 (Week 2): Patient will utilize call bell to request assistance in 50% of observable opportunities SLP Short Term Goal 4 (Week 2): Patient will recall new, daily information with Min A multimodal cues for use of external aids.  SLP Short Term Goal 5 (Week 2): Patient will demonstrate functional problem solving for basic and familiar tasks with Min A multimodal cues.   Skilled Therapeutic Interventions: Skilled treatment session focused on cognitive goals.  Upon arrival, patient was awake while upright in bed and consuming breakfast. Patient independently donned her right hearing aid and required Min A to donn her left hearing aid. Patient was transferred to wheelchair with +2 assist for safety and required Min A multimodal cues for awareness of poor positioning and lateral lean to the left while upright in the wheelchair. Patient performed self-care tasks at the sink with Min A verbal cues and extra time for functional problem solving and attention to [name] with tasks.  Patient participated in a medication management task and required Min A verbal cues for recall of functions to her current medications and Min-Mod A for problem solving and organization to organize a 2 time per day pill box.  Overall, patient appeared brighter and more engaged throughout the session. Patient left upright in wheelchair with quick release belt in place and all needs within  reach. Continue with current plan of care.    FIM:  Comprehension Comprehension Mode: Auditory Comprehension: 5-Understands basic 90% of the time/requires cueing < 10% of the time Expression Expression Mode: Verbal Expression: 4-Expresses basic 75 - 89% of the time/requires cueing 10 - 24% of the time. Needs helper to occlude trach/needs to repeat words. Social Interaction Social Interaction: 4-Interacts appropriately 75 - 89% of the time - Needs redirection for appropriate language or to initiate interaction. Problem Solving Problem Solving: 4-Solves basic 75 - 89% of the time/requires cueing 10 - 24% of the time Memory Memory: 4-Recognizes or recalls 75 - 89% of the time/requires cueing 10 - 24% of the time FIM - Eating Eating Activity: 5: Set-up assist for cut food;5: Set-up assist for open containers  Pain Pain Assessment Pain Assessment: No/denies pain  Therapy/Group: Individual Therapy  Tulsi Crossett 02/01/2015, 4:43 PM

## 2015-02-02 ENCOUNTER — Inpatient Hospital Stay (HOSPITAL_COMMUNITY): Payer: Medicare Other

## 2015-02-02 ENCOUNTER — Inpatient Hospital Stay (HOSPITAL_COMMUNITY): Payer: Medicare Other | Admitting: Occupational Therapy

## 2015-02-02 ENCOUNTER — Inpatient Hospital Stay (HOSPITAL_COMMUNITY): Payer: Medicare Other | Admitting: Physical Therapy

## 2015-02-02 ENCOUNTER — Inpatient Hospital Stay (HOSPITAL_COMMUNITY): Payer: Medicare Other | Admitting: Speech Pathology

## 2015-02-02 MED ORDER — BACLOFEN 10 MG PO TABS
5.0000 mg | ORAL_TABLET | Freq: Two times a day (BID) | ORAL | Status: DC
  Administered 2015-02-02 – 2015-02-06 (×10): 5 mg via ORAL
  Filled 2015-02-02 (×10): qty 1

## 2015-02-02 NOTE — Progress Notes (Signed)
Speech Language Pathology Daily Session Note  Patient Details  Name: Angela Keith MRN: [id number] Date of Birth: 03-27-1930  Today's Date: 02/02/2015 SLP Individual Time: 0900-1000 SLP Individual Time Calculation (min): 60 min  Short Term Goals: Week 2: SLP Short Term Goal 1 (Week 2): Patient will attend to left field of enviornment and body during familiar structured tasks with Min multimodal cues  SLP Short Term Goal 2 (Week 2): Patient will select attention to basic, familar tasks for 45 minutes with Min multimodal cues  SLP Short Term Goal 3 (Week 2): Patient will utilize call bell to request assistance in 50% of observable opportunities SLP Short Term Goal 4 (Week 2): Patient will recall new, daily information with Min A multimodal cues for use of external aids.  SLP Short Term Goal 5 (Week 2): Patient will demonstrate functional problem solving for basic and familiar tasks with Min A multimodal cues.   Skilled Therapeutic Interventions: Skilled treatment session focused on cognitive goals.  Upon arrival, patient was awake while upright in bed and agreeable to treatment session. Patient utilized the call bell with supervision verbal cues and requested assistance with transfer. Patient was transferred to wheelchair with +2 assist for safety and required Min A multimodal cues for awareness of poor positioning and lateral lean to the left while upright in the wheelchair. Patient performed self-care tasks at the sink with Min A verbal cues and extra time for functional problem solving and attention to [name] with tasks.  Patient also participated in a mildly complex, new learning card task and required Min A verbal and visual cues for functional problem solving and recall of rules throughout the task. Overall, patient appeared brighter and more engaged throughout the session. Patient left upright in wheelchair with quick release belt in place and all needs within reach. Continue with current plan of  care.   FIM:  Comprehension Comprehension Mode: Auditory Comprehension: 5-Understands basic 90% of the time/requires cueing < 10% of the time Expression Expression Mode: Verbal Expression: 4-Expresses basic 75 - 89% of the time/requires cueing 10 - 24% of the time. Needs helper to occlude trach/needs to repeat words. Social Interaction Social Interaction: 4-Interacts appropriately 75 - 89% of the time - Needs redirection for appropriate language or to initiate interaction. Problem Solving Problem Solving: 4-Solves basic 75 - 89% of the time/requires cueing 10 - 24% of the time Memory Memory: 4-Recognizes or recalls 75 - 89% of the time/requires cueing 10 - 24% of the time  Pain No/Denies Pain   Therapy/Group: Individual Therapy  Marnae Madani 02/02/2015, 3:48 PM

## 2015-02-02 NOTE — Progress Notes (Addendum)
Physical Therapy Session Note  Patient Details  Name: Angela Keith MRN: [id number] Date of Birth: 1929/11/20  Today's Date: 02/02/2015 PT Individual Time: 4098-1191 PT Individual Time Calculation (min): 60 min   Short Term Goals: Week 2:  PT Short Term Goal 1 (Week 2): Pt will ambulate about 25 feet with LRAD and max A.  PT Short Term Goal 2 (Week 2): Pt will propel w/c about 75 feet with min A.  PT Short Term Goal 3 (Week 2): Pt will ascend/descend 2 stairs with 1 rail and max A.  PT Short Term Goal 4 (Week 2): Pt will consistently sit to stand req mod A.  PT Short Term Goal 5 (Week 2): Pt will demonstrate static sit balance x 30 seconds with min A.   Skilled Therapeutic Interventions/Progress Updates:     W/C Mangement: PT instructed patient in w/c propulsion providing visual and verbal cues for forward progression (in a straight line), obstacle negotiation, and turning. Pt required supervision 90% of the time and occasional min A to reposition w/c for sharp turns through doorways/lining up with parallel bars. Pt propels w/c with RLE and RUE.   NMR: PT instructed patient in sit<>stand transfers from w/c with mod/max A to rise and maintain midline in static stance. Pt participated in gait (x1 length of parallel bars) with solid AFO on L and cone tapping in parallel bars with PT facilitating R weight shift and L hip flexion. Pt with inconsistent activation of L quad for knee extension, improves with target (cone/ball). PT facilitated dynamic standing balance with pt standing at mirror and moving post-its from one side of the mirror to the other using R hand and then using L hand. Pt requiring mod facilitation of R weight shift, max facilitation at L knee for extension in standing, and mod/max cues to stay attended to task.   Pt continues to make progress towards goals. Demonstrates decreased strength, coordination, balance, and overall functional mobility as well as difficulty staying  attended to task. Pt will continue to benefit from skilled PT for neuromuscular re-education, gait training, transfer training, and strengthening to improve independence and decrease burden of care. Pt left in w/c in room with quick release belt on and family present, personal items in reach, and all needs addressed.   Therapy Documentation Precautions:  Precautions Precautions: Fall, Back Precaution Comments: daughter brought in lumbar corset from home for comfort, L inattention, pusher to the left Restrictions Weight Bearing Restrictions: No  Vital Signs: Therapy Vitals Temp: 98.3 F (36.8 C) Temp Source: Oral Pulse Rate: 74 Resp: 18 BP: 96/60 mmHg Patient Position (if appropriate): Sitting Oxygen Therapy SpO2: 99 % O2 Device: Not Delivered  Pain: Pain Assessment Pain Assessment: Faces Faces Pain Scale: Hurts a little bit  Locomotion : Ambulation Ambulation/Gait Assistance: 2: Max Lawyer Distance: 150   See FIM for current functional status  Therapy/Group: Individual Therapy  Ladora Daniel Penven-Crew 02/02/2015, 4:58 PM

## 2015-02-02 NOTE — Progress Notes (Signed)
Social Work Patient ID: Angela Keith, female   DOB: Apr 23, 1930, 81 y.o.   MRN: [id number]   CSW met with pt and her dtr-in-law (Dena) and son late yesterday to discuss options for pt's d/c disposition and to update them on team conference discussion, including that pt's goals are downgraded to moderate assistance.  CSW again met with pt and Dena today, as Coralyn Helling was here to see pt in therapies.  They are agreeable to looking into [facility] SNF should that be needed prior to pt coming home.  Pt is still motivated to work and will do whatever it takes to get back to her home and be as independent as possible.  She realizes it may take some time and some further therapy/rehab, but she is willing to do the work.  CSW provided them with a SNF list and family talked with CSW about some preferences should they decide to go to SNF.  CSW explained that we may not wait until targeted d/c date of 02-14-15 if plans changes to SNF and they expressed understanding.

## 2015-02-02 NOTE — Progress Notes (Signed)
Occupational Therapy Session Note  Patient Details  Name: Angela Keith MRN: [id number] Date of Birth: December 20, 1929  Today's Date: 02/02/2015 OT Individual Time: 1010-1110 OT Individual Time Calculation (min): 60 min   Short Term Goals: Week 2:  OT Short Term Goal 1 (Week 2): Pt will complete bathing seated with mod assist and min cues (verbal, environmental or gestural) to maintain neutral sitting posture. OT Short Term Goal 2 (Week 2): Pt will demo ability to complete stand-pivot transfer, bed<>w/c with mod assist OT Short Term Goal 3 (Week 2): Pt will improve functional use of left hand during BADL with min assist to release grasp OT Short Term Goal 4 (Week 2): Pt will dress upper body with min assist OT Short Term Goal 5 (Week 2): Pt will dress lower body with mod assist  Skilled Therapeutic Interventions/Progress Updates: Therapeutic activity with focus on improved static/dynamic sitting balance, transfer to tub bench in standard tub, improved awareness of [name] during functional transfers, and improved grasp/release at left hand during functional tasks.   Pt received seated in w/c and receptive for plan to rehearse tub bench transfer skills in standard tub as well as continued [name] NMR.   Pt was escorted to tub room and completed squat pivot transfer to tub bench with overall mod assist.   Pt able to weigh-shift with min facilitation required this session to lean to her right and scoot laterally.  Pt maintains upright posture with min assist this session and now self-corrects lean to maintain midline orientation independently using grab bar in tub.    Pt returns to w/c again with overall mod assist and is escorted to gym.   Pt completes 5 min of therex using Sci-Fit to increase strength and movement of [name].   Pt is then escorted to [facility] gym and completes 15 min of activity to promote reach, grasp and release of left hand.   Pt removes and replaces right sock and then left hearing aid independently  during this session as functional task.   Pt returned to her room in her w/c with call light within reach and QRB attached.     Therapy Documentation Precautions:  Precautions Precautions: Fall, Back Precaution Comments: daughter brought in lumbar corset from home for comfort, L inattention, pusher to the left Restrictions Weight Bearing Restrictions: No   Vital Signs: Therapy Vitals Pulse Rate: 72 BP: 106/62 mmHg   Pain: No/denies pain    See FIM for current functional status  Therapy/Group: Individual Therapy  Morissa Obeirne 02/02/2015, 12:25 PM

## 2015-02-02 NOTE — Progress Notes (Signed)
Representative from [employer] came to fit knee and custom AFO brace. Patient refused to allow him to apply the brace.

## 2015-02-02 NOTE — Progress Notes (Signed)
[place] PHYSICAL MEDICINE & REHABILITATION     PROGRESS NOTE    Subjective/Complaints: Left leg hurts/spasming this morning. Received flexeril a few minutes before i came in. ROS: limited due to cognition, hearing, language   Objective: Vital Signs: Blood pressure 146/70, pulse 75, temperature 97.9 F (36.6 C), temperature source Oral, resp. rate 18, weight 72.4 kg (159 lb 9.8 oz), SpO2 96 %. No results found.  Recent Labs  01/31/15 0519  WBC 9.0  HGB 12.1  HCT 36.8  PLT 373    Recent Labs  01/31/15 0519  NA 134*  K 4.5  CL 100*  GLUCOSE 106*  BUN 13  CREATININE 1.08*  CALCIUM 9.5   CBG (last 3)  No results for input(s): GLUCAP in the last 72 hours.  Wt Readings from Last 3 Encounters:  01/27/15 72.4 kg (159 lb 9.8 oz)  01/17/15 71.215 kg (157 lb)  01/11/15 71.385 kg (157 lb 6 oz)    Physical Exam:  Constitutional:  Snoring, difficult to arouse Eyes: EOM are normal.  Mouth: dentition fair, oral mucosa moist Neck: Normal range of motion. Neck supple. No thyromegaly present.  Cardiovascular: Normal rate and regular rhythm.no murmurs or rubs  Respiratory: Effort normal and breath sounds normal. No respiratory distress. no wheezes GI: Soft. Bowel sounds are normal. She exhibits no distension.  Neurological: She is alert and attentive.  HOH with limited insight and awareness. [name] spastic (1+/4) with underlying 1-2/5 strength proximal to distal--apraxia.Marland Kitchen LLE:  Tone 1+/4 today hamstrings and calf, heel cord tight. MMT: 2 hf, 2+ke and 1 adf/apf. persistent inattention to left but improving. DTR's 1+ right and 2+ left. No visible spasms or clonus Skin: Skin is warm and dry.  Psychiatric: She has a normal mood and affect   Assessment/Plan: 1. Functional deficits secondary to right ACA and left thalamic infarcts after recent lumbar decompression which require 3+ hours per day of interdisciplinary therapy in a comprehensive inpatient rehab  setting. Physiatrist is providing close team supervision and 24 hour management of active medical problems listed below. Physiatrist and rehab team continue to assess barriers to discharge/monitor patient progress toward functional and medical goals. FIM: FIM - Bathing Bathing Steps Patient Completed: Chest, Right Arm, Left Arm, Abdomen, Right upper leg, Left upper leg Bathing: 3: Mod-Patient completes 5-7 59f 10 parts or 50-74%  FIM - Upper Body Dressing/Undressing Upper body dressing/undressing steps patient completed: Thread/unthread right sleeve of pullover shirt/dresss, Put head through opening of pull over shirt/dress Upper body dressing/undressing: 3: Mod-Patient completed 50-74% of tasks FIM - Lower Body Dressing/Undressing Lower body dressing/undressing steps patient completed: Thread/unthread right underwear leg, Thread/unthread right pants leg Lower body dressing/undressing: 2: Max-Patient completed 25-49% of tasks  FIM - Toileting Toileting steps completed by patient: Adjust clothing prior to toileting, Performs perineal hygiene Toileting Assistive Devices: Grab bar or rail for support Toileting: 3: Mod-Patient completed 2 of 3 steps  FIM - Diplomatic Services operational officer Devices: Therapist, music Transfers: 3-From toilet/BSC: Mod A (lift or lower assist), 3-To toilet/BSC: Mod A (lift or lower assist)  FIM - Banker Devices: Orthosis, Arm rests Bed/Chair Transfer: 2: Chair or W/C > Bed: Max A (lift and lower assist)  FIM - Locomotion: Wheelchair Distance: 150 Locomotion: Wheelchair: 4: Travels 150 ft or more: maneuvers on rugs and over door sillls with minimal assistance (Pt.>75%) FIM - Locomotion: Ambulation Locomotion: Ambulation Assistive Devices: Walker - Hemi, Orthosis (LSO and trial L AFO) Ambulation/Gait Assistance: 1: +2 Total  assist, 2: Max assist Locomotion: Ambulation: 1: Two  helpers  Comprehension Comprehension Mode: Auditory Comprehension: 5-Understands basic 90% of the time/requires cueing < 10% of the time  Expression Expression Mode: Verbal Expression: 4-Expresses basic 75 - 89% of the time/requires cueing 10 - 24% of the time. Needs helper to occlude trach/needs to repeat words.  Social Interaction Social Interaction: 4-Interacts appropriately 75 - 89% of the time - Needs redirection for appropriate language or to initiate interaction.  Problem Solving Problem Solving: 4-Solves basic 75 - 89% of the time/requires cueing 10 - 24% of the time  Memory Memory Mode: Not assessed Memory: 4-Recognizes or recalls 75 - 89% of the time/requires cueing 10 - 24% of the time  Medical Problem List and Plan: 1. Functional deficits secondary to status post lumbar decompression 01/17/2015 with postoperative right ACA, left thalamic infarcts.Has left hemiparesis Status post loop recorder -continue therapies---slow progress---goal is still to get home with family -no further feedback from family after incidents Sunday night 2. DVT Prophylaxis/Anticoagulation: continue subcutaneous heparin indicated given VTE risk  -no bleeding sequelae, platelets normal-dopplers negative 3. Pain Management:    -continued tone in LLE  -continue flexeril 5mg  q8 prn  -continue gabapentin 300mg  at 2100 for sleep, restless leg sx  - tramadol 25-50mg  q6 prn  -will reintroduce low dose baclofen for spasms/tone   4. Hypertension. Lopressor 25 mg twice a day. Generally fair control at present 5. Neuropsych: This patient is not yet capable of making decisions on her own behalf. 6. Skin/Wound Care: Routine skin checks and local care to surgical site. Encourage adequate nutrition 7. Fluids/Electrolytes/Nutrition: continue potassium supplement  -intake actually has been reasonable  -sodium up to 134.   8. Hyperlipidemia. Pravachol 9. Neurogenic bladder:  Urine with  100k e coli s/p  amoxil  -still not emptying---replace foley today   -continue low dose flomax also--HS 10. Sleep---schedule trazodone 25mg  qhs  -continue sleep chart to firmly document sleep pattern   LOS (Days) 13 A FACE TO FACE EVALUATION WAS PERFORMED  Taurus Alamo T 02/02/2015 8:54 AM

## 2015-02-02 NOTE — Plan of Care (Signed)
Problem: RH PAIN MANAGEMENT Goal: RH STG PAIN MANAGED AT OR BELOW PT'S PAIN GOAL Pain less than 2.  Outcome: Progressing No c/o pain     

## 2015-02-02 NOTE — Progress Notes (Signed)
Orthopedic Tech Progress Note Patient Details:  Angela Keith [birth year] [id number]  Patient ID: Angela Keith, female   DOB: Jan 23, 1930, 81 y.o.   MRN: [id number] Called in advanced brace order; spoke with Angela Keith, Angela Keith 02/02/2015, 10:09 AM

## 2015-02-02 NOTE — Progress Notes (Signed)
Occupational Therapy Session Note  Patient Details  Name: Angela Keith MRN: [id number] Date of Birth: 12-29-1929  Today's Date: 02/02/2015 OT Individual Time: 1400-1430 OT Individual Time Calculation (min): 30 min    Short Term Goals: Week 2:  OT Short Term Goal 1 (Week 2): Pt will complete bathing seated with mod assist and min cues (verbal, environmental or gestural) to maintain neutral sitting posture. OT Short Term Goal 2 (Week 2): Pt will demo ability to complete stand-pivot transfer, bed<>w/c with mod assist OT Short Term Goal 3 (Week 2): Pt will improve functional use of left hand during BADL with min assist to release grasp OT Short Term Goal 4 (Week 2): Pt will dress upper body with min assist OT Short Term Goal 5 (Week 2): Pt will dress lower body with mod assist  Skilled Therapeutic Interventions/Progress Updates:  Upon entering the room, pt seated in wheelchair with daughter present in room. Pt with no c/o pain this session. OT assisted pt with proper positioning in chair as she was sitting in posterior pelvic tilt in and in L lateral lean. Pt initiated moving R hip back in chair but required max A to facilitate L hip. OT propelled wheelchair to therapy gym secondary to time management. Pt engaged in 10 reps of forward weight shift with mod A secondary to L lateral lean in sitting position. Pt engaged in STS x 3 with max A and max A balance as well as assistance to facilitate upright posture. Pt very emotional during session and crying multiple times. Pt engaged in picking up 4 small rings one by one with use of L hand. Pt unable to release rings upon command. Pt was able to completely close hand and then open but fingers continued to grasp object and not letting it drop from hand. Pt returned to room with quick release belt and arm tray donned. Family members present in the room and call bell and all needed items within reach.   Therapy Documentation Precautions:   Precautions Precautions: Fall, Back Precaution Comments: daughter brought in lumbar corset from home for comfort, L inattention, pusher to the left Restrictions Weight Bearing Restrictions: No Vital Signs: Therapy Vitals Temp: 98.3 F (36.8 C) Temp Source: Oral Pulse Rate: 74 Resp: 18 BP: 96/60 mmHg Patient Position (if appropriate): Sitting Oxygen Therapy SpO2: 99 % O2 Device: Not Delivered  See FIM for current functional status  Therapy/Group: Individual Therapy  Lowella Grip 02/02/2015, 4:15 PM

## 2015-02-03 ENCOUNTER — Inpatient Hospital Stay (HOSPITAL_COMMUNITY): Payer: Medicare Other | Admitting: Occupational Therapy

## 2015-02-03 ENCOUNTER — Inpatient Hospital Stay (HOSPITAL_COMMUNITY): Payer: Medicare Other | Admitting: Physical Therapy

## 2015-02-03 ENCOUNTER — Inpatient Hospital Stay (HOSPITAL_COMMUNITY): Payer: Medicare Other | Admitting: Speech Pathology

## 2015-02-03 MED ORDER — BOOST / RESOURCE BREEZE PO LIQD
1.0000 | Freq: Three times a day (TID) | ORAL | Status: DC
  Administered 2015-02-03 – 2015-02-10 (×12): 1 via ORAL
  Filled 2015-02-03: qty 1

## 2015-02-03 NOTE — Progress Notes (Signed)
Physical Therapy Weekly Progress Note  Patient Details  Name: Angela Keith MRN: [id number] Date of Birth: 06-29-1930  Beginning of progress report period: January 27, 2015 End of progress report period: February 03, 2015  Today's Date: 02/03/2015 PT Individual Time:  -  1300-1440 Treatment Time: 100 min     Patient has met 4 of 5 short term goals.  Pt is continuing to make slow, but steady functional mobility progress. Pt's w/c propulsion and ambulation endurance is progressing: pt req less assist with w/c propulsion and has been able to use HW to ambulate during this past week, progressed from use of wall rail. PT has contacted [employer] who will measure pt for a custom L AFO (articulating with DF assist) and L knee brace for improved stability/support with upright functional mobility.   Patient continues to demonstrate the following deficits: L side inattention, motor apraxia, L hemiplegia, low activity tolerance, impaired sit and stand balance, difficulty with all aspects of functional mobility, difficulty following spinal precautions, and therefore will continue to benefit from skilled PT intervention to enhance overall performance with activity tolerance, balance, postural control, ability to compensate for deficits, functional use of  left upper extremity and left lower extremity, attention, awareness, coordination and knowledge of precautions.  Patient is making slow progress, but likelihood of achieving min A level of functional mobility is low, so goals have been downgraded to mod A. .  Plan of care revisions: .Marland Kitchen  PT Short Term Goals Week 2:  PT Short Term Goal 1 (Week 2): Pt will ambulate about 25 feet with LRAD and max A.  PT Short Term Goal 1 - Progress (Week 2): Progressing toward goal (Pt req +2 assist for w/c follow for safety. ) PT Short Term Goal 2 (Week 2): Pt will propel w/c about 75 feet with min A.  PT Short Term Goal 2 - Progress (Week 2): Met PT Short Term Goal 3 (Week 2): Pt  will ascend/descend 2 stairs with 1 rail and max A.  PT Short Term Goal 3 - Progress (Week 2): Met PT Short Term Goal 4 (Week 2): Pt will consistently sit to stand req mod A.  PT Short Term Goal 4 - Progress (Week 2): Met PT Short Term Goal 5 (Week 2): Pt will demonstrate static sit balance x 30 seconds with min A.  PT Short Term Goal 5 - Progress (Week 2): Met Week 3:  PT Short Term Goal 1 (Week 3): STGs = LTGs due to ELOS  Skilled Therapeutic Interventions/Progress Updates:    Therapeutic Activity: Pt received in bed - PT flattens out bed and removes rails. PT instructs pt in rolling L in bed req verbal cues and SBA and increased time, rolling R in bed req min A with increased time. PT instructs pt in L side lie to sit transfer req mod-max A; and in sit to L side lie transfer req mod A for B LEs. PT instructs pt in R side lie to sit transfer req mod-max A. PT instructs pt in squat-pivot transfer bed to w/c to L req mod A.   W/C Management: PT instructs pt in w/c propulsion x 150' with R UE/LE req SBA and verbal cues for attention to task.   Gait Training: Engineer, structural joined PT and pt for this section of therapy visit.  PT instructs pt in ambulation with R wall rail req mod-max A x 30': PT progresses L LE in swing and blockes L knee from buckling in stance,  as well as assisting in lateral weight shift.  PT adds a Toe Off L AFO and instructs pt in above ambulation 30' x 2 reps, with improved L foot clearance; PT adds wrap around L knee brace for improved medial-lateral stability x 30' and notes pt req similar amounts of assist with ambulation. +2 req for w/c follow for safety for above mentioned ambulation.  PT instructs pt in ascending/descending 1, then 2 (3" height) step with B rails req mod-max A each time when PT under pt's L arm.   Neuromuscular Reeducation: PT instructs pt in standing dynamic balance activity with card game on upright velcro board req mod A for standing balance -  first with R UE req verbal cues to scan L due to L inattention and then with L UE req min A to L UE to reach cards and repeated verbal cues for attention to task using vision to focus on controlling hand.   Pt is progressing slowly with mobility - will get a Toe Off AFO for improved stability during gait and transfers. Son and DIL present at end of session - son with many questions including if pt is ready to transfer to commode by herself (no), if pt will be independent at d/c (no - PT is recommending SNF d/c), and if pt will ever be independent (no). Son received information in a firm manner, but verbalized understanding. Continue per PT POC.   Therapy Documentation Precautions:  Precautions Precautions: Fall, Back Precaution Comments: daughter brought in lumbar corset from home for comfort, L inattention, pusher to the left Restrictions Weight Bearing Restrictions: No Pain: Pain Assessment Pain Assessment: No/denies pain  See FIM for current functional status  Therapy/Group: Individual Therapy with Annie Main, PT Tech as +2  [facility] M 02/03/2015, 8:53 AM

## 2015-02-03 NOTE — Progress Notes (Signed)
Occupational Therapy Session Note  Patient Details  Name: Angela Keith MRN: [id number] Date of Birth: 05/04/1930  Today's Date: 02/03/2015 OT Individual Time: 1004-1100 OT Individual Time Calculation (min): 56 min    Skilled Therapeutic Interventions/Progress Updates:    Pt worked on Chief of Staff during session while sitting on therapy mat in the gym.  Pt needed max assist for transfer from the wheelchair to the mat stand pivot.  Max facilitation needed at trunk and for advancement of the LLE during the transfer.  Pt sits in a posterior pelvic tilt with increased lean to the left side.  Therapist provided mod facilitation for pt to achieve and maintain neutral pelvic and trunk alignment.  Provided mirror as well for visual feedback to the pt.  She was able to maintain static sitting with min assist if visual attention was maintained on the mirror.  Max assist needed for sitting balance when performing functional reaching task.  Incorporated functional reaching with the [name] as well.  Pt with increased motor apraxia noted as she is able to open and close her hand to command and for grasping an object but cannot actively let it go.  Focused on having pt reach at chest level, with mod assist at times for elbow extension and flexion, in order to place clothespins back in the appropriate container.  Incorporated some functional reach across midline slightly to encourage weightshifting over the right hip, as pt also tends to push herself to the left.  Therapist had to provide max demonstrational cueing to keep pt from stabilizing and pushing with the RUE on the mat as well, which made her lean to the left.  Pt returned to chair via stand pivot at end of session and taken to nurses station.  Half lap tray and safety belt in place.     Therapy Documentation Precautions:  Precautions Precautions: Fall, Back Precaution Comments: daughter brought in lumbar corset from home for comfort, L  inattention, pusher to the left Restrictions Weight Bearing Restrictions: No  Pain: Pain Assessment Pain Assessment: No/denies pain ADL: See FIM for current functional status  Therapy/Group: Individual Therapy  Tranae Laramie OTR/L 02/03/2015, 12:45 PM

## 2015-02-03 NOTE — Progress Notes (Signed)
Speech Language Pathology Weekly Progress and Session Note  Patient Details  Name: Angela Keith MRN: [id number] Date of Birth: 03-18-1930  Beginning of progress report period: January 27, 2015 End of progress report period: February 03, 2015  Today's Date: 02/03/2015 SLP Individual Time: 0800-0900 SLP Individual Time Calculation (min): 60 min  Short Term Goals: Week 2: SLP Short Term Goal 1 (Week 2): Patient will attend to left field of enviornment and body during familiar structured tasks with Min multimodal cues  SLP Short Term Goal 1 - Progress (Week 2): Met SLP Short Term Goal 2 (Week 2): Patient will select attention to basic, familar tasks for 45 minutes with Min multimodal cues  SLP Short Term Goal 2 - Progress (Week 2): Met SLP Short Term Goal 3 (Week 2): Patient will utilize call bell to request assistance in 50% of observable opportunities SLP Short Term Goal 3 - Progress (Week 2): Met SLP Short Term Goal 4 (Week 2): Patient will recall new, daily information with Min A multimodal cues for use of external aids.  SLP Short Term Goal 4 - Progress (Week 2): Met SLP Short Term Goal 5 (Week 2): Patient will demonstrate functional problem solving for basic and familiar tasks with Min A multimodal cues.  SLP Short Term Goal 5 - Progress (Week 2): Met    New Short Term Goals: Week 3: SLP Short Term Goal 1 (Week 3): Patient will attend to left field of enviornment and body during familiar structured tasks with supervision multimodal cues  SLP Short Term Goal 2 (Week 3): Patient will select attention to basic, familar tasks for 45 minutes with supervision multimodal cues  SLP Short Term Goal 3 (Week 3): Patient will utilize call bell to request assistance in 75% of observable opportunities with supervision question cues.  SLP Short Term Goal 4 (Week 3): Patient will recall new, daily information with  supervision multimodal cues for use of external aids.  SLP Short Term Goal 5 (Week 3):  Patient will demonstrate functional problem solving for mildy complex and familiar tasks with Min A multimodal cues.   Weekly Progress Updates: Patient has made excellent gains and has met 5 of 5 STG's this reporting period due to increased attention, attention to left field of environment, recall of new information, problem solving and safety. Currently, patient is oriented X 4 with Mod I and requires Min A multimodal cues for selective attention for 45 minutes, intellectual awareness of deficits, functional problem solving and recall and Mod A for safety in regards to use of call bell and requesting assistance. Patient and family education is ongoing. Patient would benefit from continued skilled SLP intervention to maximize her cognitive function and overall functional independence.   Intensity: Minumum of 1-2 x/day, 30 to 90 minutes Frequency: 3 to 5 out of 7 days Duration/Length of Stay: 02/14/15 Treatment/Interventions: Cognitive remediation/compensation;Cueing hierarchy;Environmental controls;Functional tasks;Internal/external aids;Patient/family education;Therapeutic Activities   Daily Session  Skilled Therapeutic Interventions: Skilled treatment session focused on cognitive goals. Upon arrival, patient was awake while upright in bed and agreeable to treatment session. Patient utilized the call bell with supervision verbal cues and requested assistance with transfer. Patient was transferred to wheelchair with +2 assist for safety and required Min A multimodal cues for awareness of poor positioning and lateral lean to the left while upright in the wheelchair. Patient performed self-care tasks at the sink with Min A verbal cues and extra time for functional problem solving and attention to [name] with tasks. Patient also participated  in a mildly complex, task of recalling a familiar recipe and creating a grocery list. Patient required extra time and supervision question cues for recall of ingredients  and sequencing recipe.  Patient left upright in wheelchair with quick release belt in place at RN station. Continue with current plan of care.      FIM:  Comprehension Comprehension Mode: Auditory Comprehension: 5-Understands basic 90% of the time/requires cueing < 10% of the time Expression Expression Mode: Verbal Expression: 4-Expresses basic 75 - 89% of the time/requires cueing 10 - 24% of the time. Needs helper to occlude trach/needs to repeat words. Social Interaction Social Interaction: 4-Interacts appropriately 75 - 89% of the time - Needs redirection for appropriate language or to initiate interaction. Problem Solving Problem Solving: 4-Solves basic 75 - 89% of the time/requires cueing 10 - 24% of the time Memory Memory: 4-Recognizes or recalls 75 - 89% of the time/requires cueing 10 - 24% of the time Pain Patient reports pain in left ankle, RN made aware and administered medications   Therapy/Group: Individual Therapy  Zharia Conrow 02/03/2015, 11:37 AM

## 2015-02-03 NOTE — Progress Notes (Addendum)
[place] PHYSICAL MEDICINE & REHABILITATION     PROGRESS NOTE    Subjective/Complaints: Just finished working with SLP, pt distracted Per SLP was c/o left ankle pain after fitting of brace yesterday  ROS: limited due to cognition, hearing, language   Objective: Vital Signs: Blood pressure 132/60, pulse 83, temperature 98.2 F (36.8 C), temperature source Oral, resp. rate 17, weight 72.4 kg (159 lb 9.8 oz), SpO2 94 %. No results found. No results for input(s): WBC, HGB, HCT, PLT in the last 72 hours. No results for input(s): NA, K, CL, GLUCOSE, BUN, CREATININE, CALCIUM in the last 72 hours.  Invalid input(s): CO CBG (last 3)  No results for input(s): GLUCAP in the last 72 hours.  Wt Readings from Last 3 Encounters:  01/27/15 72.4 kg (159 lb 9.8 oz)  01/17/15 71.215 kg (157 lb)  01/11/15 71.385 kg (157 lb 6 oz)    Physical Exam:  Constitutional:  WD, WN, NAD Eyes: EOM are normal.  Mouth: dentition fair, oral mucosa moist Neck: Normal range of motion. Neck supple. No thyromegaly present.  Cardiovascular: Normal rate and regular rhythm.no murmurs or rubs  Respiratory: Effort normal and breath sounds normal. No respiratory distress. no wheezes GI: Soft. Bowel sounds are normal. She exhibits no distension.  Neurological: She is alert and attentive.  HOH with limited insight and awareness. [name] spastic (1+/4) with underlying 1-2/5 strength proximal to distal--apraxia.Marland Kitchen LLE:  Tone 1+/4 today hamstrings and calf, heel cord tight. MMT: 2 hf, 2+ke and 1 adf/apf. persistent inattention to left but improving. DTR's 1+ right and 2+ left. No visible spasms or clonus MSK_  No ankle or foot swelling no pain with ROM, skin abrasion Left lat malleolus Skin: Skin is warm and dry.  Psychiatric: She has a normal mood and affect   Assessment/Plan: 1. Functional deficits secondary to right ACA and left thalamic infarcts after recent lumbar decompression which require 3+ hours per day of  interdisciplinary therapy in a comprehensive inpatient rehab setting. Physiatrist is providing close team supervision and 24 hour management of active medical problems listed below. Physiatrist and rehab team continue to assess barriers to discharge/monitor patient progress toward functional and medical goals. FIM: FIM - Bathing Bathing Steps Patient Completed: Chest, Right Arm, Left Arm, Abdomen, Right upper leg, Left upper leg Bathing: 3: Mod-Patient completes 5-7 59f 10 parts or 50-74%  FIM - Upper Body Dressing/Undressing Upper body dressing/undressing steps patient completed: Thread/unthread right sleeve of pullover shirt/dresss, Put head through opening of pull over shirt/dress Upper body dressing/undressing: 3: Mod-Patient completed 50-74% of tasks FIM - Lower Body Dressing/Undressing Lower body dressing/undressing steps patient completed: Thread/unthread right underwear leg, Thread/unthread right pants leg Lower body dressing/undressing: 2: Max-Patient completed 25-49% of tasks  FIM - Toileting Toileting steps completed by patient: Adjust clothing prior to toileting, Performs perineal hygiene Toileting Assistive Devices: Grab bar or rail for support Toileting: 3: Mod-Patient completed 2 of 3 steps  FIM - Diplomatic Services operational officer Devices: Grab bars Toilet Transfers: 3-From toilet/BSC: Mod A (lift or lower assist), 3-To toilet/BSC: Mod A (lift or lower assist)  FIM - Banker Devices:  (LSO ) Bed/Chair Transfer: 2: Bed > Chair or W/C: Max A (lift and lower assist)  FIM - Locomotion: Wheelchair Distance: 150 Locomotion: Wheelchair: 4: Travels 150 ft or more: maneuvers on rugs and over door sillls with minimal assistance (Pt.>75%) FIM - Locomotion: Ambulation Locomotion: Ambulation Assistive Devices: Orthosis, Parallel bars Ambulation/Gait Assistance: 2: Max assist Locomotion:  Ambulation: 1: Two  helpers  Comprehension Comprehension Mode: Auditory Comprehension: 5-Understands basic 90% of the time/requires cueing < 10% of the time  Expression Expression Mode: Verbal Expression: 4-Expresses basic 75 - 89% of the time/requires cueing 10 - 24% of the time. Needs helper to occlude trach/needs to repeat words.  Social Interaction Social Interaction: 4-Interacts appropriately 75 - 89% of the time - Needs redirection for appropriate language or to initiate interaction.  Problem Solving Problem Solving: 4-Solves basic 75 - 89% of the time/requires cueing 10 - 24% of the time  Memory Memory Mode: Not assessed Memory: 4-Recognizes or recalls 75 - 89% of the time/requires cueing 10 - 24% of the time  Medical Problem List and Plan: 1. Functional deficits secondary to status post lumbar decompression 01/17/2015 with postoperative right ACA, left thalamic infarcts.Has left hemiparesis Status post loop recorder -continue therapies---slow progress---goal is still to get home with family but SNF a possibility  2. DVT Prophylaxis/Anticoagulation: continue subcutaneous heparin indicated given VTE risk  -no bleeding sequelae, platelets normal-dopplers negative 3. Pain Management:    -continued tone in LLE  -continue flexeril 5mg  q8 prn  -continue gabapentin 300mg  at 2100 for sleep, restless leg sx  - tramadol 25-50mg  q6 prn  -will reintroduce low dose baclofen for spasms/tone   4. Hypertension. Lopressor 25 mg twice a day. Generally fair control at present 5. Neuropsych: This patient is not yet capable of making decisions on her own behalf. 6. Skin/Wound Care: Routine skin checks and local care to surgical site. Encourage adequate nutrition 7. Fluids/Electrolytes/Nutrition: continue potassium supplement  -intake actually has been reasonable  -sodium up to 134.   8. Hyperlipidemia. Pravachol 9. Neurogenic bladder:  Urine with  100k e coli s/p amoxil  -retention-foley   -continue low  dose flomax also--HS 10. Sleep---schedule trazodone 25mg  qhs  -continue sleep chart to firmly document sleep pattern   LOS (Days) 14 A FACE TO FACE EVALUATION WAS PERFORMED  Erick Colace 02/03/2015 9:43 AM

## 2015-02-03 NOTE — Plan of Care (Signed)
Problem: RH Bed Mobility Goal: LTG Patient will perform bed mobility with assist (PT) LTG: Patient will perform bed mobility with assistance, with/without cues (PT).  Goal downgraded due to slow progress  Problem: RH Bed to Chair Transfers Goal: LTG Patient will perform bed/chair transfers w/assist (PT) LTG: Patient will perform bed/chair transfers with assistance, with/without cues (PT).  Goal downgraded due to slow progress  Problem: RH Car Transfers Goal: LTG Patient will perform car transfers with assist (PT) LTG: Patient will perform car transfers with assistance (PT).  Goal downgraded due to slow progress  Problem: RH Ambulation Goal: LTG Patient will ambulate in controlled environment (PT) LTG: Patient will ambulate in a controlled environment, # of feet with assistance (PT).  Goal downgraded due to slow progress Goal: LTG Patient will ambulate in home environment (PT) LTG: Patient will ambulate in home environment, # of feet with assistance (PT).  Goal downgraded due to slow progress  Problem: RH Stairs Goal: LTG Patient will ambulate up and down stairs w/assist (PT) LTG: Patient will ambulate up and down # of stairs with assistance (PT)  Goal downgraded due to slow progress

## 2015-02-04 ENCOUNTER — Inpatient Hospital Stay (HOSPITAL_COMMUNITY): Payer: Medicare Other | Admitting: Speech Pathology

## 2015-02-04 ENCOUNTER — Inpatient Hospital Stay (HOSPITAL_COMMUNITY): Payer: Medicare Other | Admitting: Occupational Therapy

## 2015-02-04 ENCOUNTER — Inpatient Hospital Stay (HOSPITAL_COMMUNITY): Payer: Medicare Other | Admitting: Physical Therapy

## 2015-02-04 NOTE — Progress Notes (Signed)
Physical Therapy Session Note  Patient Details  Name: Angela Keith MRN: [id number] Date of Birth: Feb 26, 1930  Today's Date: 02/04/2015 PT Individual Time: 1400-1445 PT Individual Time Calculation (min): 45 min   Short Term Goals: Week 3:  PT Short Term Goal 1 (Week 3): STGs = LTGs due to ELOS  Skilled Therapeutic Interventions/Progress Updates:    Therapeutic Activity: PT instructs pt in bed mobility, rolling R in flat bed without rail req min A, R side lie to sit eob req mod A. Pt req mod-max A sit to stand from eob, and mod A squat-pivot transfer w/c to bed to L (pushing with R) transfer.   W/C Management: PT instructs pt in w/c propulsion with R UE/LE x 150' req SBA and verbal cues for L environment attention. PT progresses this to w/c propulsion with B UEs for L UE apraxia using bimanual movement, with rolling mirror in front of pt x 100' req hand over hand assist.   Neuromuscular Reeducation: PT instructs pt in dynamic sit balance in w/c: bumping pt's R shoulder to PT's L shoulder, progressed to standing balance: bumping pt's R shoulder to Tech's L shoulder, bumping pt's R hip to Tech's L hip, then pumping R shoulder & R hip to Tech's L shoulder & hip x 10 res each - using mirror visual feedback for upright posture.   Pt's w/c management is progressing - demonstrating ability to navigate out of room without bumping objects on L for the first time today. W/C propulsion can be progressed to include weaving around cones or forced use of L UE (for B UE propulsion) to improve motor control. Continue per PT POC.   Therapy Documentation Precautions:  Precautions Precautions: Fall, Back Precaution Comments: daughter brought in lumbar corset from home for comfort, L inattention, pusher to the left Restrictions Weight Bearing Restrictions: No Pain: Pain Assessment Pain Assessment: No/denies pain  See FIM for current functional status  Therapy/Group: Individual  Therapy  Damontay Alred M 02/04/2015, 2:43 PM

## 2015-02-04 NOTE — Progress Notes (Signed)
Patient ID: Angela Keith, female   DOB: 05/21/1930, 81 y.o.   MRN: [id number]   [facility]ABILITATION     PROGRESS NOTE   02/04/15.   81 -year-old patient admitted for CIR with  functional deficits secondary to right ACA and left thalamic infarcts after recent lumbar decompression   Subjective/Complaints: Follows commands but mute this am Per SLP was c/o left ankle pain after fitting of brace yesterday  ROS: limited due to cognition, hearing, language  Past Medical History  Diagnosis Date  . Hypothyroidism   . Arthritis   . GERD (gastroesophageal reflux disease)   . Dysphagia   . Hyperlipidemia   . Right bundle branch block 03/21/2014  . HTN (hypertension) 10/30/2012  . Unspecified hypothyroidism 10/30/2012  . Severe hearing loss   . DJD (degenerative joint disease), lumbar   . Rotoscoliosis   . Glaucoma   . DJD (degenerative joint disease), cervical   . Chronic neck pain   . Nocturia   . Insomnia   . Stroke   . Shortness of breath dyspnea     Objective: Vital Signs: Blood pressure 112/66, pulse 71, temperature 98.3 F (36.8 C), temperature source Oral, resp. rate 18, weight 159 lb 9.8 oz (72.4 kg), SpO2 93 %. No results found. No results for input(s): WBC, HGB, HCT, PLT in the last 72 hours. No results for input(s): NA, K, CL, GLUCOSE, BUN, CREATININE, CALCIUM in the last 72 hours.  Invalid input(s): CO CBG (last 3)  No results for input(s): GLUCAP in the last 72 hours.  Wt Readings from Last 3 Encounters:  01/27/15 159 lb 9.8 oz (72.4 kg)  01/17/15 157 lb (71.215 kg)  01/11/15 157 lb 6 oz (71.385 kg)   Patient Vitals for the past 24 hrs:  BP Temp Temp src Pulse Resp SpO2  02/04/15 0839 112/66 mmHg - - 71 - -  02/04/15 0640 106/63 mmHg 98.3 F (36.8 C) Oral 68 18 93 %  02/03/15 2210 132/74 mmHg - - 88 - -  02/03/15 1501 125/70 mmHg 98.6 F (37 C) Oral 90 16 96 %     Intake/Output Summary (Last 24 hours) at 02/04/15 0903 Last  data filed at 02/04/15 0631  Gross per 24 hour  Intake    120 ml  Output   1200 ml  Net  -1080 ml    Physical Exam:  Constitutional:  WD, WN, NAD Eyes: EOM are normal.  Mouth: dentition fair, oral mucosa moist Neck: Normal range of motion. Neck supple. No thyromegaly present.  Cardiovascular: Normal rate and regular rhythm.no murmurs or rubs  Respiratory: Effort normal and breath sounds normal. No respiratory distress. no wheezes GI: Soft. Bowel sounds are normal. She exhibits no distension.  Neurological: She is alert and attentive.  HOH with limited insight and awareness.  Left-sided weakness with some spasticity MSK_  No ankle or foot swelling no pain with ROM, skin abrasion Left lat malleolus Skin: Bilateral ankle braces  Psychiatric: She has a normal mood and affect   Assessment/Plan: 1. Functional deficits secondary to status post lumbar decompression 01/17/2015 with postoperative right ACA, left thalamic infarcts.Has left hemiparesis Status post loop recorder -continue therapies---slow progress---goal is still to get home with family but SNF a possibility  2. DVT Prophylaxis/Anticoagulation: continue subcutaneous heparin indicated given VTE risk  -no bleeding sequelae, platelets normal-dopplers negative 3. Pain Management:    -continued tone in LLE  -continue flexeril 5mg  q8 prn  -continue gabapentin 300mg  at 2100  for sleep, restless leg sx  - tramadol 25-50mg  q6 prn  -will reintroduce low dose baclofen for spasms/tone   4. Hypertension. Lopressor 25 mg twice a day. Generally fair control at present  5. Neurogenic bladder:  Urine with  100k e coli s/p amoxil  -retention-foley   -continue low dose flomax also--HS   -continue sleep chart to firmly document sleep pattern   LOS (Days) 15 A FACE TO FACE EVALUATION WAS PERFORMED  Rogelia Boga 02/04/2015 9:01 AM

## 2015-02-04 NOTE — Progress Notes (Signed)
Speech Language Pathology Daily Session Note  Patient Details  Name: Angela Keith MRN: [id number] Date of Birth: [birth year]  Today's Date: 02/04/2015 SLP Individual Time: 1003-1030 SLP Individual Time Calculation (min): 27 min  Short Term Goals: Week 3: SLP Short Term Goal 1 (Week 3): Patient will attend to left field of enviornment and body during familiar structured tasks with supervision multimodal cues  SLP Short Term Goal 2 (Week 3): Patient will select attention to basic, familar tasks for 45 minutes with supervision multimodal cues  SLP Short Term Goal 3 (Week 3): Patient will utilize call bell to request assistance in 75% of observable opportunities with supervision question cues.  SLP Short Term Goal 4 (Week 3): Patient will recall new, daily information with  supervision multimodal cues for use of external aids.  SLP Short Term Goal 5 (Week 3): Patient will demonstrate functional problem solving for mildy complex and familiar tasks with Min A multimodal cues.   Skilled Therapeutic Interventions:  Pt was seen for skilled ST targeting cognitive goals.  Upon arrival, pt was partially reclined in bed, slouched over onto her right side.  Pt was aware that she was leaning heavily on her right but required mod-max assist for bed mobility to reposition herself to sitting as upright as possible in bed.  Pt verbalized that she had a call bell to ask for assistance with repositioning but had not initiated its use prior to SLP's arrival.  Once pt was repositioned, SLP facilitated the session with a novel card game targeting selective attention to task and visual scanning to the left of midline.  Pt selectively attended to task in a mildly distracting environment for its duration (~15 minutes) with no cues needed for redirection.  She also located and utilized cards to the left of midline with supervision cues.  Pt was left in bed with call bell within reach and bed alarm activated.  Continue per  current plan of care.   FIM:  Comprehension Comprehension Mode: Auditory Comprehension: 5-Understands basic 90% of the time/requires cueing < 10% of the time Expression Expression Mode: Verbal Expression: 4-Expresses basic 75 - 89% of the time/requires cueing 10 - 24% of the time. Needs helper to occlude trach/needs to repeat words. Social Interaction Social Interaction: 4-Interacts appropriately 75 - 89% of the time - Needs redirection for appropriate language or to initiate interaction. Problem Solving Problem Solving: 4-Solves basic 75 - 89% of the time/requires cueing 10 - 24% of the time Memory Memory: 5-Recognizes or recalls 90% of the time/requires cueing < 10% of the time  Pain Pain Assessment Pain Assessment: No/denies pain  Therapy/Group: Individual Therapy  Johnmark Geiger, Melanee Spry 02/04/2015, 12:33 PM

## 2015-02-04 NOTE — Progress Notes (Signed)
Occupational Therapy Session Note  Patient Details  Name: Angela Keith MRN: [id number] Date of Birth: 1930-03-24  Today's Date: 02/04/2015 OT Individual Time: 7829-5621 OT Individual Time Calculation (min): 54 min    Short Term Goals: Week 2:  OT Short Term Goal 1 (Week 2): Pt will complete bathing seated with mod assist and min cues (verbal, environmental or gestural) to maintain neutral sitting posture. OT Short Term Goal 2 (Week 2): Pt will demo ability to complete stand-pivot transfer, bed<>w/c with mod assist OT Short Term Goal 3 (Week 2): Pt will improve functional use of left hand during BADL with min assist to release grasp OT Short Term Goal 4 (Week 2): Pt will dress upper body with min assist OT Short Term Goal 5 (Week 2): Pt will dress lower body with mod assist  Skilled Therapeutic Interventions/Progress Updates:  Upon entering the room, pt seated in wheelchair awaiting therapist with no c/o pain. Pt requesting to wash at sink this session. Pt seated in wheelchair at sink side to wash UB and utilizing mirror as visual feedback for midline orientation. Pt requiring mod verbal cues to attend to L UE during self care tasks and hand over hand assistance to utilize L UE to apply deodorant and wash R UE. Pt unable to release wash cloth on command from L hand. Pt required total A balance with sit to stand at sink side to wash buttocks and peri area. OT standing with pt while second helper assisted with LB clothing management. Once dressed, pt reports, "I am exhausted." Max A squat pivot transfer bed >wheelchair and sit >supine. Bed alarm activated.Call bell and all needed items within reach.    Therapy Documentation Precautions:  Precautions Precautions: Fall, Back Precaution Comments: daughter brought in lumbar corset from home for comfort, L inattention, pusher to the left Restrictions Weight Bearing Restrictions: No Pain: Pain Assessment Pain Assessment: No/denies pain  See  FIM for current functional status  Therapy/Group: Individual Therapy  Lowella Grip 02/04/2015, 4:33 PM

## 2015-02-05 ENCOUNTER — Inpatient Hospital Stay (HOSPITAL_COMMUNITY): Payer: Medicare Other | Admitting: Occupational Therapy

## 2015-02-05 MED ORDER — GUAIFENESIN 100 MG/5ML PO SYRP
200.0000 mg | ORAL_SOLUTION | Freq: Four times a day (QID) | ORAL | Status: DC | PRN
  Administered 2015-02-05 – 2015-02-09 (×5): 200 mg via ORAL
  Filled 2015-02-05 (×6): qty 10

## 2015-02-05 MED ORDER — PHENOL 1.4 % MT LIQD
2.0000 | OROMUCOSAL | Status: DC | PRN
Start: 2015-02-05 — End: 2015-02-10
  Administered 2015-02-05: 2 via OROMUCOSAL
  Filled 2015-02-05: qty 177

## 2015-02-05 NOTE — Progress Notes (Signed)
Patient ID: YASHIKA MASK, female   DOB: 1929-08-18, 81 y.o.   MRN: [id number]  Patient ID: LINDLEY STACHNIK, female   DOB: Nov 16, 1929, 81 y.o.   MRN: [id number]   [facility]IC[facility] RE[facility]ABILITATION     PROGRESS NOTE   02/05/15.   51 -year-old patient admitted for CIR with  functional deficits secondary to right ACA and left thalamic infarcts after recent lumbar decompression   Subjective/Complaints: Follows commands; more somnolent this am   ROS: limited due to cognition, hearing, language  Past Medical History  Diagnosis Date  . Hypothyroidism   . Arthritis   . GERD (gastroesophageal reflux disease)   . Dysphagia   . Hyperlipidemia   . Right bundle branch block 03/21/2014  . HTN (hypertension) 10/30/2012  . Unspecified hypothyroidism 10/30/2012  . Severe hearing loss   . DJD (degenerative joint disease), lumbar   . Rotoscoliosis   . Glaucoma   . DJD (degenerative joint disease), cervical   . Chronic neck pain   . Nocturia   . Insomnia   . Stroke   . Shortness of breath dyspnea     Objective: Vital Signs: Blood pressure 130/68, pulse 75, temperature 98 F (36.7 C), temperature source Oral, resp. rate 18, weight 159 lb 9.8 oz (72.4 kg), SpO2 95 %. No results found. No results for input(s): WBC, HGB, HCT, PLT in the last 72 hours. No results for input(s): NA, K, CL, GLUCOSE, BUN, CREATININE, CALCIUM in the last 72 hours.  Invalid input(s): CO CBG (last 3)  No results for input(s): GLUCAP in the last 72 hours.  Wt Readings from Last 3 Encounters:  01/27/15 159 lb 9.8 oz (72.4 kg)  01/17/15 157 lb (71.215 kg)  01/11/15 157 lb 6 oz (71.385 kg)   Patient Vitals for the past 24 hrs:  BP Temp Temp src Pulse Resp SpO2  02/05/15 0616 130/68 mmHg 98 F (36.7 C) Oral 75 18 95 %  02/04/15 2214 126/66 mmHg - - 88 - -  02/04/15 1700 122/62 mmHg 98.2 F (36.8 C) Oral 88 17 100 %  02/04/15 0839 112/66 mmHg - - 71 - -     Intake/Output Summary (Last 24 hours)  at 02/05/15 0813 Last data filed at 02/05/15 0617  Gross per 24 hour  Intake    480 ml  Output   1400 ml  Net   -920 ml    Physical Exam:  Constitutional:  WD, WN, NAD Eyes: EOM are normal.  Mouth: dentition fair, oral mucosa moist Neck: Normal range of motion. Neck supple. No thyromegaly present.  Cardiovascular: Normal rate and regular rhythm.no murmurs or rubs  Respiratory: Effort normal and breath sounds normal. No respiratory distress. no wheezes GI: Soft. Bowel sounds are normal. She exhibits no distension.  GU- foley in place Neurological: She is slightly somnolent this am.  HOH with limited insight and awareness.  Left-sided weakness with some spasticity MSK_  No ankle or foot swelling no pain with ROM, skin abrasion Left lat malleolus Skin: Bilateral ankle braces  Psychiatric: She has a normal mood and affect   Assessment/Plan: 1. Functional deficits secondary to status post lumbar decompression 01/17/2015 with postoperative right ACA, left thalamic infarcts.Has left hemiparesis Status post loop recorder -continue therapies---slow progress---goal is still to get home with family but SNF a possibility  2. DVT Prophylaxis/Anticoagulation: continue subcutaneous heparin indicated given VTE risk  -no bleeding sequelae, platelets normal-dopplers negative 3. Pain Management:    -continued tone in LLE  -  continue flexeril  q8 prn  -continue gabapentin  at 2100 for sleep, restless leg sx  - tramadol 25-50mg  q6 prn  -will reintroduce low dose baclofen for spasms/tone   4. Hypertension. Lopressor 25 mg twice a day. Generally fair control at present  5. Neurogenic bladder:  Urine with  100k e coli s/p amoxil  -retention-foley   -continue low dose flomax also--HS     LOS (Days) 16 A FACE TO FACE EVALUATION WAS PERFORMED  Rogelia Boga 02/05/2015 8:13 AM

## 2015-02-05 NOTE — Progress Notes (Signed)
Occupational Therapy Session Note  Patient Details  Name: Angela Keith MRN: [id number] Date of Birth: 1930/02/25  Today's Date: 02/05/2015 OT Individual Time: 1445-1530 OT Individual Time Calculation (min): 45 min    Short Term Goals: Week 1:  OT Short Term Goal 1 (Week 1): Pt will perform UB dressing with mod A in order to decrease level of assist with self care. OT Short Term Goal 1 - Progress (Week 1): Met OT Short Term Goal 2 (Week 1): Pt will perform toileting with assist from 1 person for clothing management and hygiene.  OT Short Term Goal 2 - Progress (Week 1): Met OT Short Term Goal 3 (Week 1): Pt will perform LB dressing with max A or better for standing balance.  OT Short Term Goal 3 - Progress (Week 1): Met OT Short Term Goal 4 (Week 1): Pt will perform grooming with min A in order to increase I with self care. OT Short Term Goal 4 - Progress (Week 1): Met Week 2:  OT Short Term Goal 1 (Week 2): Pt will complete bathing seated with mod assist and min cues (verbal, environmental or gestural) to maintain neutral sitting posture. OT Short Term Goal 2 (Week 2): Pt will demo ability to complete stand-pivot transfer, bed<>w/c with mod assist OT Short Term Goal 3 (Week 2): Pt will improve functional use of left hand during BADL with min assist to release grasp OT Short Term Goal 4 (Week 2): Pt will dress upper body with min assist OT Short Term Goal 5 (Week 2): Pt will dress lower body with mod assist  Skilled Therapeutic Interventions/Progress Updates:    Skilled OT intervention with treatment focus on the following:  Transfers, balance in standing and sitting, [name] NMRE.  Pt Transferred from wc>mat with max assist and facilitation for midline control.   OT educated pt and daughter on extension exercises to do with [name] when not in therapy.  Practiced exercises with yard stick for reaching.  Pt encouraged to reach further when given number feedback on distance she had reached.   Provided nose and goal post cues for midline balance.  Pt transferred at end back to wc and taken to room with all needs in reach.     Therapy Documentation Precautions:  Precautions Precautions: Fall, Back Precaution Comments: daughter brought in lumbar corset from home for comfort, L inattention, pusher to the left Restrictions Weight Bearing Restrictions: No       Pain:  3/10 low back             See FIM for current functional status  Therapy/Group: Individual Therapy  Lisa Roca 02/05/2015, 6:39 PM

## 2015-02-05 NOTE — Plan of Care (Signed)
Problem: RH BLADDER ELIMINATION Goal: RH STG MANAGE BLADDER WITH ASSISTANCE STG Manage Bladder With max Assistance  Foley in place

## 2015-02-06 ENCOUNTER — Inpatient Hospital Stay (HOSPITAL_COMMUNITY): Payer: Medicare Other | Admitting: Physical Therapy

## 2015-02-06 ENCOUNTER — Inpatient Hospital Stay (HOSPITAL_COMMUNITY): Payer: Medicare Other | Admitting: Speech Pathology

## 2015-02-06 ENCOUNTER — Inpatient Hospital Stay (HOSPITAL_COMMUNITY): Payer: Medicare Other | Admitting: Occupational Therapy

## 2015-02-06 ENCOUNTER — Inpatient Hospital Stay (HOSPITAL_COMMUNITY): Payer: Medicare Other

## 2015-02-06 NOTE — Progress Notes (Signed)
Occupational Therapy Session Note  Patient Details  Name: Angela Keith MRN: [id number] SCHOENBECKf Birth: [birth year]  Today's Date: 02/06/2015 OT Individual Time: 0800-0900 OT Individual Time Calculation (min): 60 min     Skilled Therapeutic Interventions/Progress Updates:    1:1 Pt in bed when arrived with report she had be nauseated before therapy began. Pt reports feeling better after medicine. Self care retraining with focus on bed mobility to come to EOB, sitting balance at EOB with tactile and verbal cues to maintain midline. Stand pivot to w/c towards her left with max A. Transitioned into bathroom and transferred to tub bench with mod A with grab bar and extra time for setup. Pt required constant tactile, visual and verbal cuing to maintain sitting balance at midline. Pt continually leaning towards her left with decr awareness. Focus on use of left hand at non dominant level with cuing for functional reach and releasing of object at appropriate time with extra time and multimodal cuing. Pt returned to sink level to dress. Pt able to tolerate crossing her left LE to thread underwear and pants over left foot. Pt performed sit to stand with min A with both hands on edge of sink to come into standing and used mirror for visual feedback to maintain upright trunk posture at midline. Pt able to complete grooming with supervision with mod cuing for opportunities to incorporate left hand with extra time.   Therapy Documentation Precautions:  Precautions Precautions: Fall, Back Precaution Comments: daughter brought in lumbar corset from home for comfort, L inattention, pusher to the left Restrictions Weight Bearing Restrictions: No Pain:  c/o left Le discomfort/ cramping.  Improved with OOB, weight bearing with transferring.   See FIM for current functional status  Therapy/Group: Individual Therapy  Roney Mans [facility] 02/06/2015, 9:32 AM

## 2015-02-06 NOTE — Progress Notes (Signed)
Physical Therapy Session Note  Patient Details  Name: Angela Keith MRN: [id number] Date of Birth: October 03, 1929  Today's Date: 02/06/2015 PT Individual Time: 1400-1515 PT Individual Time Calculation (min): 75 min   Short Term Goals: Week 2:  PT Short Term Goal 1 (Week 2): Pt will ambulate about 25 feet with LRAD and max A.  PT Short Term Goal 1 - Progress (Week 2): Progressing toward goal (Pt req +2 assist for w/c follow for safety. ) PT Short Term Goal 2 (Week 2): Pt will propel w/c about 75 feet with min A.  PT Short Term Goal 2 - Progress (Week 2): Met PT Short Term Goal 3 (Week 2): Pt will ascend/descend 2 stairs with 1 rail and max A.  PT Short Term Goal 3 - Progress (Week 2): Met PT Short Term Goal 4 (Week 2): Pt will consistently sit to stand req mod A.  PT Short Term Goal 4 - Progress (Week 2): Met PT Short Term Goal 5 (Week 2): Pt will demonstrate static sit balance x 30 seconds with min A.  PT Short Term Goal 5 - Progress (Week 2): Met  Skilled Therapeutic Interventions/Progress Updates:   Pt received after OT session in gym.  Performed transfer w/c > mat squat pivot with max A with pt demonstrating improved initiation of forwards weight shifting and maintaining a squat during full pivot.  Performed sit > side > supine on flat mat with mod A with verbal and tactile cues for sequence.  Performed LLE stretches to minimize spasticity and pain.  Transitioned to L sidelying on elbow for NMR; see below for details.  Performed side > sit from L side with mod A and tactile cues to push up through L elbow.  Continued NMR in standing; see below.  During each stand pt reporting nausea but willing to continue.  At end of session pt began to c/o increasing nausea and attempted to vomit but unsuccessful.  RN notified of nausea and dry-heaving.  Pt transferred to bed with max A squat pivot and sit > supine mod A.  Pt left with HOB elevated and all items within reach.     Therapy  Documentation Precautions:  Precautions Precautions: Fall, Back Precaution Comments: daughter brought in lumbar corset from home for comfort, L inattention, pusher to the left Restrictions Weight Bearing Restrictions: No Pain: Pain Assessment Pain Assessment: No/denies pain Faces Pain Scale: Hurts even more Pain Type: Acute pain Pain Location: Leg Pain Orientation: Left;Upper Pain Descriptors / Indicators: Cramping;Spasm Pain Frequency: Intermittent Pain Onset: On-going Patients Stated Pain Goal: 0 Pain Intervention(s): Distraction;Ambulation/increased activity;Repositioned;Environmental changes Multiple Pain Sites: No Locomotion : Ambulation Ambulation/Gait Assistance: 2: Max assist  Other Treatments: Treatments Neuromuscular Facilitation: Right;Left;Upper Extremity;Lower Extremity;Forced use;Activity to increase coordination;Activity to increase motor control;Activity to increase timing and sequencing;Activity to increase grading;Activity to increase sustained activation;Activity to increase lateral weight shifting;Activity to increase anterior-posterior weight shifting beginning in supine with bilat >> LLE unilateral bridging for glute activation for extension; transitioned to L sidelying with focus on activation of L side proximal stability muscles in shoulder, trunk and hip for stabilization during RLE dynamic movement.  Performed NMR in standing with [name] support on elevated mat and therapist providing tactile cues and facilitation for lateral weight shifting, activation of [name] and LE in WB during RLE heel raises and forwards stepping.  Continued NMR with gait x 25' with RUE support on wall rail with max A and facilitation on L side for upright trunk and pelvic forwards  rotation, activation of LLE advancement and cues for activation of LLE in stance phase--pt requiring decrease time for activation today and improved stance phase control in LLE; no verbal cues required today during  gait.  See FIM for current functional status  Therapy/Group: Individual Therapy  Raylene Everts [facility] 02/06/2015, 3:24 PM

## 2015-02-06 NOTE — Progress Notes (Signed)
Late entry: Audra therapist reported that pt experienced nausea and vomiting while in therapy session. Pt returned to room and placed in bed. Zofran  po given at 1522. Medication effective. Pt reported no further nausea or vomiting.

## 2015-02-06 NOTE — Progress Notes (Signed)
Patient with c/o nausea this am at 0600. Noted to be belching often however, no emesis. Large formed BM V/S- B/P- 116/73, P- 88, R- 18, Temp- 98.7, O2- 95%.  Administered Zofran  po at 0607 with effective results. Patient denies further c/o nausea. Dr. Rosalyn Charters on unit and reported episode. Sydell Axon, RN

## 2015-02-06 NOTE — Progress Notes (Signed)
Patient noted with dry non-productive cough. States, "annoying and tickles." V/S- B/P- 130/68, P- 82, Temp- 98.4, O2- 99% RA, R- 18. Lung sounds clear. Notified Amador Cunastkowski with new orders received for Robitussin PRN and Chloraseptic spay PRN. Administered at 2230 with effective results. No distress noted. Sydell Axon, RN

## 2015-02-06 NOTE — Plan of Care (Signed)
Problem: RH PAIN MANAGEMENT Goal: RH STG PAIN MANAGED AT OR BELOW PT'S PAIN GOAL Pain less than 2  Outcome: Not Progressing C/o of leg discomfort. Rates 5

## 2015-02-06 NOTE — Progress Notes (Signed)
Speech Language Pathology Daily Session Note  Patient Details  Name: Angela Keith MRN: [id number] Date of Birth: 03-Apr-1930  Today's Date: 02/06/2015 SLP Individual Time: 1000-1100 SLP Individual Time Calculation (min): 60 min  Short Term Goals: Week 3: SLP Short Term Goal 1 (Week 3): Patient will attend to left field of enviornment and body during familiar structured tasks with supervision multimodal cues  SLP Short Term Goal 2 (Week 3): Patient will select attention to basic, familar tasks for 45 minutes with supervision multimodal cues  SLP Short Term Goal 3 (Week 3): Patient will utilize call bell to request assistance in 75% of observable opportunities with supervision question cues.  SLP Short Term Goal 4 (Week 3): Patient will recall new, daily information with  supervision multimodal cues for use of external aids.  SLP Short Term Goal 5 (Week 3): Patient will demonstrate functional problem solving for mildy complex and familiar tasks with Min A multimodal cues.   Skilled Therapeutic Interventions: Skilled treatment session focused on cognitive goals. SLP facilitated session by providing Min A question and verbal cues for recall during a mildly complex, novel task with focus on memory with utilization of compensatory strategies.  Patient continues to demonstrate increased awareness of deficits and reported that she "needs to go somewhere else for more therapy" and became tearful. SLP provided emotional support.  Patient left upright in wheelchair with quick release belt in place and all needs within reach. Continue with current plan of care.    FIM:  Comprehension Comprehension Mode: Auditory Comprehension: 5-Follows basic conversation/direction: With no assist Expression Expression Mode: Verbal Expression: 5-Expresses basic needs/ideas: With extra time/assistive device Social Interaction Social Interaction: 6-Interacts appropriately with others with medication or extra time  (anti-anxiety, antidepressant). Problem Solving Problem Solving: 5-Solves basic 90% of the time/requires cueing < 10% of the time Memory Memory: 5-Recognizes or recalls 90% of the time/requires cueing < 10% of the time  Pain Pain Assessment Pain Assessment: No/denies pain  Therapy/Group: Individual Therapy  Edelyn Heidel 02/06/2015, 4:16 PM

## 2015-02-06 NOTE — Progress Notes (Signed)
Occupational Therapy Session Note  Patient Details  Name: Angela Keith MRN: [id number] Date of Birth: May 15, 1930  Today's Date: 02/06/2015 OT Individual Time: 1300-1400 OT Individual Time Calculation (min): 60 min    Short Term Goals: Week 2:  OT Short Term Goal 1 (Week 2): Pt will complete bathing seated with mod assist and min cues (verbal, environmental or gestural) to maintain neutral sitting posture. OT Short Term Goal 2 (Week 2): Pt will demo ability to complete stand-pivot transfer, bed<>w/c with mod assist OT Short Term Goal 3 (Week 2): Pt will improve functional use of left hand during BADL with min assist to release grasp OT Short Term Goal 4 (Week 2): Pt will dress upper body with min assist OT Short Term Goal 5 (Week 2): Pt will dress lower body with mod assist  Skilled Therapeutic Interventions/Progress Updates: Therapeutic activity with focus on functional use of left hand.  Pt received seated in w/c requesting assist with pain management of left leg.  After repositioning, OT distracted pt with transfer to mat raised mat and gentle PROM of left leg for approx 10 min to abate symptoms of pain.   Pt returned to w/c and then participated in 50 min of functional hand exercises to improve grasp/release of left hand.   Pt picked up object (bean bags) and improved release after repeated trials using cognitive distractions to reduce motor perseveration.   Pt was most successful with strategy to transfer object from left hand to right hand rather than focusing on active release with left hand isolated.    Pt was received by physical therapist at end of session.     Therapy Documentation Precautions:  Precautions Precautions: Fall, Back Precaution Comments: daughter brought in lumbar corset from home for comfort, L inattention, pusher to the left Restrictions Weight Bearing Restrictions: No  Pain: 6/10, left leg, spasms/cramps, distracted/repositioned/change of environment    See  FIM for current functional status  Therapy/Group: Individual Therapy  Jacklyn Branan 02/06/2015, 2:48 PM

## 2015-02-06 NOTE — Progress Notes (Addendum)
Angela Keith PHYSICAL MEDICINE & REHABILITATION     PROGRESS NOTE    Subjective/Complaints: Woke up with nausea this morning. Beginning to improve after receiving zofran po. RN reports some belching. States that she "hurts all over."  ROS: limited due to cognition, hearing, language   Objective: Vital Signs: Blood pressure 132/76, pulse 96, temperature 98.7 F (37.1 C), temperature source Oral, resp. rate 16, weight 72.4 kg (159 lb 9.8 oz), SpO2 95 %. No results found. No results for input(s): WBC, HGB, HCT, PLT in the last 72 hours. No results for input(s): NA, K, CL, GLUCOSE, BUN, CREATININE, CALCIUM in the last 72 hours.  Invalid input(s): CO CBG (last 3)  No results for input(s): GLUCAP in the last 72 hours.  Wt Readings from Last 3 Encounters:  01/27/15 72.4 kg (159 lb 9.8 oz)  01/17/15 71.215 kg (157 lb)  01/11/15 71.385 kg (157 lb 6 oz)    Physical Exam:  Constitutional:  WD, WN, NAD Eyes: EOM are normal.  Mouth: dentition fair, oral mucosa moist Neck: Normal range of motion. Neck supple. No thyromegaly present.  Cardiovascular: Normal rate and regular rhythm.no murmurs or rubs  Respiratory: Effort normal and breath sounds normal. No respiratory distress. no wheezes GI: Soft. Bowel sounds are decreased. She exhibits no distension.  Neurological: She is alert and attentive.  HOH with limited insight and awareness. [name] spastic (1+/4) with underlying 1+ to 2/5 strength proximal to distal--apraxia.Marland Kitchen LLE:  Tone 1+/4 today hamstrings and calf, heel cord tight still. MMT: 2 hf, 2+ke and 1 adf/apf. persistent inattention to left but improving. DTR's 1+ right and 2+ left. No visible spasms or clonus MSK_  No ankle or foot swelling no pain with ROM, skin abrasion Left lat malleolus Skin: Skin is warm and dry.  Psychiatric: She has a normal mood and affect   Assessment/Plan: 1. Functional deficits secondary to right ACA and left thalamic infarcts after recent lumbar  decompression which require 3+ hours per day of interdisciplinary therapy in a comprehensive inpatient rehab setting. Physiatrist is providing close team supervision and 24 hour management of active medical problems listed below. Physiatrist and rehab team continue to assess barriers to discharge/monitor patient progress toward functional and medical goals. FIM: FIM - Bathing Bathing Steps Patient Completed: Chest, Right Arm, Left Arm, Abdomen, Right upper leg, Left upper leg, Front perineal area Bathing: 3: Mod-Patient completes 5-7 4f 10 parts or 50-74%  FIM - Upper Body Dressing/Undressing Upper body dressing/undressing steps patient completed: Thread/unthread right sleeve of pullover shirt/dresss, Put head through opening of pull over shirt/dress, Thread/unthread left sleeve of pullover shirt/dress, Pull shirt over trunk Upper body dressing/undressing: 4: Steadying assist FIM - Lower Body Dressing/Undressing Lower body dressing/undressing steps patient completed: Thread/unthread right pants leg, Thread/unthread left pants leg, Don/Doff right sock Lower body dressing/undressing: 2: Max-Patient completed 25-49% of tasks  FIM - Toileting Toileting steps completed by patient: Adjust clothing prior to toileting, Adjust clothing after toileting Toileting Assistive Devices: Grab bar or rail for support Toileting: 3: Mod-Patient completed 2 of 3 steps  FIM - Diplomatic Services operational officer Devices: Elevated toilet seat Toilet Transfers: 3-To toilet/BSC: Mod A (lift or lower assist)  FIM - Banker Devices: Orthosis Bed/Chair Transfer: 4: Supine > Sit: Min A (steadying Pt. > 75%/lift 1 leg), 2: Bed > Chair or W/C: Max A (lift and lower assist)  FIM - Locomotion: Wheelchair Distance: 150 Locomotion: Wheelchair: 5: Travels 150 ft or more: maneuvers on rugs and  over door sills with supervision, cueing or coaxing FIM - Locomotion:  Ambulation Locomotion: Ambulation Assistive Devices: Other (comment), Orthosis (R wall rail) Ambulation/Gait Assistance: 1: +2 Total assist, 2: Max assist Locomotion: Ambulation: 1: Two helpers  Comprehension Comprehension Mode: Auditory Comprehension: 5-Follows basic conversation/direction: With no assist  Expression Expression Mode: Verbal Expression: 5-Expresses basic needs/ideas: With no assist  Social Interaction Social Interaction: 6-Interacts appropriately with others with medication or extra time (anti-anxiety, antidepressant).  Problem Solving Problem Solving: 5-Solves basic 90% of the time/requires cueing < 10% of the time  Memory Memory Mode: Not assessed Memory: 5-Recognizes or recalls 90% of the time/requires cueing < 10% of the time  Medical Problem List and Plan: 1. Functional deficits secondary to status post lumbar decompression 01/17/2015 with postoperative right ACA, left thalamic infarcts.Has left hemiparesis Status post loop recorder -continue therapies---slow progress---pt works hard  2. DVT Prophylaxis/Anticoagulation: sq heparin indicated given VTE risk--no changes  -no bleeding sequelae, platelets normal-dopplers negative 3. Pain Management:    -continued tone in LLE  -continue flexeril 5mg  q8 prn  -continue gabapentin 300mg  at 2100 for sleep, restless leg sx  - tramadol 25-50mg  q6 prn  -will keep low dose baclofen for spasms/tone (may need to hold if nausea worse)   4. Hypertension. Lopressor 25 mg twice a day. Generally fair control at present 5. Neuropsych: This patient is not yet capable of making decisions on her own behalf. 6. Skin/Wound Care: Routine skin checks and local care to surgical site. Encourage adequate nutrition 7. Fluids/Electrolytes/Nutrition: continue potassium supplement  -intake actually has been reasonable  -sodium up to 134.  --recheck labs tomorrow 8. Hyperlipidemia. Pravachol 9. Neurogenic bladder:  Urine with  100k e  coli s/p amoxil  -retention-foley   -continue low dose flomax also--HS 10. Sleep---schedule trazodone 25mg  qhs  -continue sleep chart to firmly document sleep pattern 11. Nausea: med induced? Moving bowels per RN. May have gas this morning also   LOS (Days) 17 A FACE TO FACE EVALUATION WAS PERFORMED  SWARTZ,ZACHARY T 02/06/2015 9:35 AM

## 2015-02-07 ENCOUNTER — Inpatient Hospital Stay (HOSPITAL_COMMUNITY): Payer: Medicare Other

## 2015-02-07 ENCOUNTER — Inpatient Hospital Stay (HOSPITAL_COMMUNITY): Payer: Medicare Other | Admitting: Occupational Therapy

## 2015-02-07 ENCOUNTER — Inpatient Hospital Stay (HOSPITAL_COMMUNITY): Payer: Medicare Other | Admitting: Speech Pathology

## 2015-02-07 DIAGNOSIS — E871 Hypo-osmolality and hyponatremia: Secondary | ICD-10-CM

## 2015-02-07 LAB — CBC
HEMATOCRIT: 35.3 % — AB (ref 36.0–46.0)
Hemoglobin: 11.7 g/dL — ABNORMAL LOW (ref 12.0–15.0)
MCH: 29.1 pg (ref 26.0–34.0)
MCHC: 33.1 g/dL (ref 30.0–36.0)
MCV: 87.8 fL (ref 78.0–100.0)
Platelets: 298 10*3/uL (ref 150–400)
RBC: 4.02 MIL/uL (ref 3.87–5.11)
RDW: 14.4 % (ref 11.5–15.5)
WBC: 5.5 10*3/uL (ref 4.0–10.5)

## 2015-02-07 LAB — BASIC METABOLIC PANEL
ANION GAP: 8 (ref 5–15)
BUN: 11 mg/dL (ref 6–20)
CHLORIDE: 96 mmol/L — AB (ref 101–111)
CO2: 24 mmol/L (ref 22–32)
Calcium: 9.5 mg/dL (ref 8.9–10.3)
Creatinine, Ser: 0.84 mg/dL (ref 0.44–1.00)
GFR calc Af Amer: >60 mL/min (ref >60)
GFR calc non Af Amer: >60 mL/min (ref >60)
GLUCOSE: 103 mg/dL — AB (ref 65–99)
Potassium: 4.5 mmol/L (ref 3.5–5.1)
SODIUM: 128 mmol/L — AB (ref 135–145)

## 2015-02-07 MED ORDER — ALBUTEROL SULFATE (2.5 MG/3ML) 0.083% IN NEBU
2.5000 mg | INHALATION_SOLUTION | Freq: Four times a day (QID) | RESPIRATORY_TRACT | Status: DC | PRN
  Administered 2015-02-08: 2.5 mg via RESPIRATORY_TRACT
  Filled 2015-02-07: qty 3

## 2015-02-07 NOTE — Plan of Care (Signed)
Problem: RH SKIN INTEGRITY Goal: RH STG ABLE TO PERFORM INCISION/WOUND CARE W/ASSISTANCE STG Able To Perform Incision/Wound Care With total Assistance.  Outcome: Progressing Due to location of incision on back  Problem: RH PAIN MANAGEMENT Goal: RH STG PAIN MANAGED AT OR BELOW PT'S PAIN GOAL Pain less than 2  Outcome: Progressing No c/o pain

## 2015-02-07 NOTE — Progress Notes (Signed)
Speech Language Pathology Daily Session Note  Patient Details  Name: Angela Keith MRN: [id number] Date of Birth: 01-Jun-1930  Today's Date: 02/07/2015 SLP Individual Time: 1100-1200 SLP Individual Time Calculation (min): 60 min  Short Term Goals: Week 3: SLP Short Term Goal 1 (Week 3): Patient will attend to left field of enviornment and body during familiar structured tasks with supervision multimodal cues  SLP Short Term Goal 2 (Week 3): Patient will select attention to basic, familar tasks for 45 minutes with supervision multimodal cues  SLP Short Term Goal 3 (Week 3): Patient will utilize call bell to request assistance in 75% of observable opportunities with supervision question cues.  SLP Short Term Goal 4 (Week 3): Patient will recall new, daily information with  supervision multimodal cues for use of external aids.  SLP Short Term Goal 5 (Week 3): Patient will demonstrate functional problem solving for mildy complex and familiar tasks with Min A multimodal cues.   Skilled Therapeutic Interventions: Skilled treatment session focused on cognitive goals. Upon arrival, patient was upright in wheelchair and agreeable to participate in treatment session. Patient propelled herself to and from the dayroom with extra time and Mod verbal cues for attention to left field of environment. SLP facilitated session by providing supervision question cues for recall of events from previous therapy sessions and Min A verbal cues for problem solving and organization during a mildly complex scheduling task.  Patient attended to left field of environment throughout session with Mod I and attended to [name] with Mod A verbal cues and extra time. Patient left upright in wheelchair with quick release belt in place and all needs within reach. Continue with current plan of care.    FIM:  Comprehension Comprehension Mode: Auditory Comprehension: 5-Follows basic conversation/direction: With no  assist Expression Expression Mode: Verbal Expression: 5-Expresses basic needs/ideas: With extra time/assistive device Social Interaction Social Interaction: 6-Interacts appropriately with others with medication or extra time (anti-anxiety, antidepressant). Problem Solving Problem Solving: 5-Solves basic 90% of the time/requires cueing < 10% of the time Memory Memory: 5-Recognizes or recalls 90% of the time/requires cueing < 10% of the time  Pain Pain Assessment Pain Assessment: No/denies pain Faces Pain Scale: Hurts a little bit (with movement)  Therapy/Group: Individual Therapy  Kaymon Denomme 02/07/2015, 2:22 PM

## 2015-02-07 NOTE — Progress Notes (Signed)
Occupational Therapy Weekly Progress Note  Patient Details  Name: Angela Keith MRN: [id number] Date of Birth: Jan 13, 1930  Beginning of progress report period: January 30, 2015 End of progress report period: February 07, 2015  Today's Date: 02/07/2015 OT Individual Time: 1300-1330 OT Individual Time Calculation (min): 30 min    Patient has met 2 of 4 short term goals.  Pt demonstrated improved sitting balance and left attention during this week, and now demonstrates ability to perform assisted transfers safely (both squat-pivot and stand-pivot), consistently with only one person although performance varies from moderate assistance to maximum assistance.   Pt verbalizes family (daughter and son) are currently pursuing a secondary facility for continued rehab prior to any plan to bring pt back to her home with assistance, as indicated.     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  Patient progressing toward long term goals..  Continue plan of care.  OT Short Term Goals Week 1:  OT Short Term Goal 1 (Week 1): Pt will perform UB dressing with mod A in order to decrease level of assist with self care. OT Short Term Goal 1 - Progress (Week 1): Met OT Short Term Goal 2 (Week 1): Pt will perform toileting with assist from 1 person for clothing management and hygiene.  OT Short Term Goal 2 - Progress (Week 1): Met OT Short Term Goal 3 (Week 1): Pt will perform LB dressing with max A or better for standing balance.  OT Short Term Goal 3 - Progress (Week 1): Met OT Short Term Goal 4 (Week 1): Pt will perform grooming with min A in order to increase I with self care. OT Short Term Goal 4 - Progress (Week 1): Met Week 2:  OT Short Term Goal 1  (Week 2): Pt will complete bathing seated with mod assist and min cues (verbal, environmental or gestural) to maintain neutral sitting posture. OT Short Term Goal 1 - Progress (Week 2): Met OT Short Term Goal 2 (Week 2): Pt will demo ability to complete stand-pivot transfer, bed<>w/c with mod assist OT Short Term Goal 2 - Progress (Week 2): Met OT Short Term Goal 3 (Week 2): Pt will improve functional use of left hand during BADL with min assist to release grasp OT Short Term Goal 3 - Progress (Week 2): Progressing toward goal OT Short Term Goal 4 (Week 2): Pt will dress upper body with min assist OT Short Term Goal 4 - Progress (Week 2): Not progressing OT Short Term Goal 5 (Week 2): Pt will dress lower body with mod assist OT Short Term Goal 5 - Progress (Week 2): Progressing toward goal Week 3:  OT Short Term Goal 1 (Week 3): Pt will demo ability to complete lower body bathing, supine with mod assist. OT Short Term Goal 2 (Week 3): Pt will demo ability to pull up pants over right hip while seated using lateral leans OT Short Term Goal 3 (Week 3): Pt will wash upper body seated at sink with min assist OT Short Term Goal 4 (Week 3): Pt will groom, 3 of 5 tasks, with setup and supervision  Skilled Therapeutic Interventions/Progress Updates: ADL-retraining with focus on transfers, lateral weigh-shifting seated, and undressing skills with focus on compensation for left hand (limb apraxia).    Pt  received seated in her w/c with poor postural support d/t right lateral lean and sacral sitting posture.   Pt aware of meal tray present however declined cue to self-feed after setup d/t continued nausea.   Pt was aware of therapy schedule with current session terminating daily treatment.   Pt requested return to bed and completed transfer re-training with overall mod assist for lateral scoot transfer to her right.   Pt required mod assist for bed mobility and then initiated undressing to gown.   Pt educated on  rolling to pull pants down each hip with good effort, overall mod assist to undress left leg.    Pt attempted undressing shirt however she was challenged by sustained grasp at left hand and could not problem-solve to release her grip on clothing.   Pt requires overall max assist to undress upper body.    Pt reports awareness of residual deficits and declared that her son and daughter are currently pursuing placement at a secondary rehab facility for her.    Pt left in bed with call light within reach and K-pad applied to her thighs for comfort.     Therapy Documentation Precautions:  Precautions Precautions: Fall, Back Precaution Comments: daughter brought in lumbar corset from home for comfort, L inattention, pusher to the left Restrictions Weight Bearing Restrictions: No  Pain: Pain Assessment Pain Assessment: Faces Faces Pain Scale: Hurts a little bit (with movement)  Other Treatments: Treatments Therapeutic Activity: NMR Neuromuscular Facilitation: Right;Left;Upper Extremity;Lower Extremity;Forced use;Activity to increase coordination;Activity to increase motor control;Activity to increase sustained activation;Activity to increase lateral weight shifting;Activity to increase anterior-posterior weight shifting Weight Bearing Technique Weight Bearing Technique: Yes RUE Weight Bearing Technique: Extended arm seated [name] Weight Bearing Technique: Extended arm seated;Forearm seated  See FIM for current functional status  Therapy/Group: Individual Therapy  [place] 02/07/2015, 2:04 PM

## 2015-02-07 NOTE — Progress Notes (Signed)
Occupational Therapy Session Note  Patient Details  Name: Angela Keith MRN: [id number] Date of Birth: 23-Jul-1929  Today's Date: 02/07/2015 OT Individual Time: 0800-0900 OT Individual Time Calculation (min): 60 min    Short Term Goals: Week 3:  OT Short Term Goal 1 (Week 3): Pt will demo ability to complete lower body bathing, supine with mod assist. OT Short Term Goal 2 (Week 3): Pt will demo ability to pull up pants over right hip while seated using lateral leans OT Short Term Goal 3 (Week 3): Pt will wash upper body seated at sink with min assist OT Short Term Goal 4 (Week 3): Pt will groom, 3 of 5 tasks, with setup and supervision  Skilled Therapeutic Interventions/Progress Updates:    1:1 self care retraining at sink level including bathing, grooming and dressing. Focus on bed mobility and basic stand pivot transfer bed<w/c, hemi dressing techniques, sit to stands, standing balance and tolerance with upright posture and at midline, functional use of left hand at non -dominant level, release of objects in left hand in functional tasks, functional problem solving etc. Pt with increased recall of use of hemi dressing techniques: including crossing left Le over right to thread underwear and pants. Sit to stand with min to mod A with bilateral hands on sink and using mirror for visual feedback about posture. Pt able to maintain standing balance with min to mod A with bilateral UE support while therapist assist with clothing management. Pt with improved attention to left body and environment however did require min cuing at times to release object hand was holding with no awareness.   Therapy Documentation Precautions:  Precautions Precautions: Fall, Back Precaution Comments: daughter brought in lumbar corset from home for comfort, L inattention, pusher to the left Restrictions Weight Bearing Restrictions: No Pain: No c/o pain See FIM for current functional status  Therapy/Group:  Individual Therapy  Roney Mans [facility] 02/07/2015, 3:30 PM

## 2015-02-07 NOTE — Plan of Care (Signed)
Problem: RH Car Transfers Goal: LTG Patient will perform car transfers with assist (PT) LTG: Patient will perform car transfers with assistance (PT).  Outcome: Not Applicable Date Met:  33/38/32 D/C due to SNF placement  Problem: RH Ambulation Goal: LTG Patient will ambulate in home environment (PT) LTG: Patient will ambulate in home environment, # of feet with assistance (PT).  Outcome: Not Applicable Date Met:  91/91/66 D/C due to SNF placement; 02/07/15  Problem: RH Wheelchair Mobility Goal: LTG Patient will propel w/c in controlled environment (PT) LTG: Patient will propel wheelchair in controlled environment, # of feet with assist (PT)  Downgraded 02/07/15 Goal: LTG Patient will propel w/c in home environment (PT) LTG: Patient will propel wheelchair in home environment, # of feet with assistance (PT).  Outcome: Not Applicable Date Met:  06/00/45 D/C due to SNF placement 02/07/15 Goal: LTG Patient will propel w/c in community environment (PT) LTG: Patient will propel wheelchair in community environment, # of feet with assist (PT)  Outcome: Not Applicable Date Met:  99/77/41 D/C due to SNF placement 02/07/15  Problem: RH Stairs Goal: LTG Patient will ambulate up and down stairs w/assist (PT) LTG: Patient will ambulate up and down # of stairs with assistance (PT)  Downgraded 02/07/15

## 2015-02-07 NOTE — Plan of Care (Signed)
Problem: RH BLADDER ELIMINATION Goal: RH STG MANAGE BLADDER WITH ASSISTANCE STG Manage Bladder With max Assistance  Outcome: Not Progressing Total A-Pt has foley catheter

## 2015-02-07 NOTE — Progress Notes (Signed)
Physical Therapy Session Note  Patient Details  Name: Angela Keith MRN: [id number] Date of Birth: Jan 30, 1930  Today's Date: 02/07/2015 PT Individual Time: 0900-1005 PT Individual Time Calculation (min): 65 min   Short Term Goals: Week 2:  PT Short Term Goal 1 (Week 2): Pt will ambulate about 25 feet with LRAD and max A.  PT Short Term Goal 1 - Progress (Week 2): Progressing toward goal (Pt req +2 assist for w/c follow for safety. ) PT Short Term Goal 2 (Week 2): Pt will propel w/c about 75 feet with min A.  PT Short Term Goal 2 - Progress (Week 2): Met PT Short Term Goal 3 (Week 2): Pt will ascend/descend 2 stairs with 1 rail and max A.  PT Short Term Goal 3 - Progress (Week 2): Met PT Short Term Goal 4 (Week 2): Pt will consistently sit to stand req mod A.  PT Short Term Goal 4 - Progress (Week 2): Met PT Short Term Goal 5 (Week 2): Pt will demonstrate static sit balance x 30 seconds with min A.  PT Short Term Goal 5 - Progress (Week 2): Met  Skilled Therapeutic Interventions/Progress Updates:     W/C: Pt able to propel w/c x140' with supervision and min verbal/visual cues for obstacle negotiation/doorways/positioning at Hi/Low table. Pt propels w/c with RUE and LLE.  Pt able to manage brakes bilaterally with verbal cues to lock/unlock chair.   NMR: Pt participated in [place] activities using RUE to facilitate L quad extension with repeated sit<>stands (mod A to rise, max A to lower). Pt performed w/c<>mat transfer with max A for squat pivot with emphasis on reaching RUE and positioning of LEs.  PT instructed patient in weight shifting R/L in front of mirror to facilitate pt awareness of midline and posture. Pt participated in NMR activity to facilitate weight shifting and reaching for post-its on the mirror and placing them down on the mat.  See below for details.   Pt progressing towards goals. Continue to follow per POC.   Therapy Documentation Precautions:  Precautions Precautions:  Fall, Back Precaution Comments: daughter brought in lumbar corset from home for comfort, L inattention, pusher to the left Restrictions Weight Bearing Restrictions: No  Pain: Pain Assessment Pain Assessment: Faces Faces Pain Scale: Hurts a little bit (with movement)  Locomotion : Wheelchair Mobility Distance: 150   Other Treatments: Treatments Therapeutic Activity: NMR Neuromuscular Facilitation: Right;Left;Upper Extremity;Lower Extremity;Forced use;Activity to increase coordination;Activity to increase motor control;Activity to increase sustained activation;Activity to increase lateral weight shifting;Activity to increase anterior-posterior weight shifting Weight Bearing Technique Weight Bearing Technique: Yes RUE Weight Bearing Technique: Extended arm seated [name] Weight Bearing Technique: Extended arm seated;Forearm seated  See FIM for current functional status  Therapy/Group: Individual Therapy  Malcolm Hetz E Penven-Crew 02/07/2015, 11:56 AM

## 2015-02-07 NOTE — Progress Notes (Signed)
Chest x-ray results available and reported to Harvel Ricks, PA. No new orders given at this time.

## 2015-02-07 NOTE — Progress Notes (Signed)
[facility] PHYSICAL MEDICINE & REHABILITATION     PROGRESS NOTE    Subjective/Complaints: Had a much better night. "i decide i was going to work this leg, and i did it with therapy!". Denies pain this morning. No n/v/d/constipation,sob, headache  ROS: limited due to cognition, hearing, language   Objective: Vital Signs: Blood pressure 107/62, pulse 81, temperature 98.1 F (36.7 C), temperature source Oral, resp. rate 16, weight 72.4 kg (159 lb 9.8 oz), SpO2 96 %. No results found.  Recent Labs  02/07/15 0506  WBC 5.5  HGB 11.7*  HCT 35.3*  PLT 298    Recent Labs  02/07/15 0506  NA 128*  K 4.5  CL 96*  GLUCOSE 103*  BUN 11  CREATININE 0.84  CALCIUM 9.5   CBG (last 3)  No results for input(s): GLUCAP in the last 72 hours.  Wt Readings from Last 3 Encounters:  01/27/15 72.4 kg (159 lb 9.8 oz)  01/17/15 71.215 kg (157 lb)  01/11/15 71.385 kg (157 lb 6 oz)    Physical Exam:  Constitutional:  NAD Eyes: EOM are normal.  Mouth: dentition fair, oral mucosa moist Neck: Normal range of motion. Neck supple. No thyromegaly present.  Cardiovascular: Normal rate and regular rhythm.no murmurs or rubs  Respiratory: Effort normal and breath sounds normal. No respiratory distress. no wheezes GI: Soft. Bowel sounds are decreased. She exhibits no distension.  Neurological: She is alert and attentive.  HOH with limited insight and awareness. [name] spastic (1+/4)  1+ to 2/5 strength proximal to distal--improving apraxia.Marland Kitchen LLE:  Tone 1+/4 today hamstrings and calf, heel cord tight still. MMT: 2 hf, 2+ke and 1 adf/apf. persistent inattention to left but improving. DTR's 1+ right and 2+ left. No visible spasms or clonus Musc:  No ankle or foot swelling no pain with ROM, skin abrasion Left lat malleolus healing Skin: Skin is warm and dry.  Psychiatric: She has a normal mood and affect. Very alert and animated today   Assessment/Plan: 1. Functional deficits secondary to right  ACA and left thalamic infarcts after recent lumbar decompression which require 3+ hours per day of interdisciplinary therapy in a comprehensive inpatient rehab setting. Physiatrist is providing close team supervision and 24 hour management of active medical problems listed below. Physiatrist and rehab team continue to assess barriers to discharge/monitor patient progress toward functional and medical goals. FIM: FIM - Bathing Bathing Steps Patient Completed: Chest, Right Arm, Left Arm, Abdomen, Right upper leg, Left upper leg, Front perineal area Bathing: 3: Mod-Patient completes 5-7 36f 10 parts or 50-74%  FIM - Upper Body Dressing/Undressing Upper body dressing/undressing steps patient completed: Thread/unthread right sleeve of pullover shirt/dresss, Put head through opening of pull over shirt/dress, Thread/unthread left sleeve of pullover shirt/dress, Pull shirt over trunk Upper body dressing/undressing: 4: Steadying assist FIM - Lower Body Dressing/Undressing Lower body dressing/undressing steps patient completed: Thread/unthread right pants leg, Thread/unthread left pants leg, Don/Doff right sock Lower body dressing/undressing: 2: Max-Patient completed 25-49% of tasks  FIM - Toileting Toileting steps completed by patient: Adjust clothing prior to toileting, Adjust clothing after toileting Toileting Assistive Devices: Grab bar or rail for support Toileting: 3: Mod-Patient completed 2 of 3 steps  FIM - Diplomatic Services operational officer Devices: Elevated toilet seat Toilet Transfers: 3-To toilet/BSC: Mod A (lift or lower assist)  FIM - Banker Devices: Orthosis Bed/Chair Transfer: 3: Supine > Sit: Mod A (lifting assist/Pt. 50-74%/lift 2 legs, 3: Sit > Supine: Mod A (lifting assist/Pt.  50-74%/lift 2 legs), 2: Bed > Chair or W/C: Max A (lift and lower assist), 2: Chair or W/C > Bed: Max A (lift and lower assist)  FIM - Locomotion:  Wheelchair Distance: 150 Locomotion: Wheelchair: 1: Total Assistance/staff pushes wheelchair (Pt<25%) FIM - Locomotion: Ambulation Locomotion: Ambulation Assistive Devices: Orthosis, Other (comment) (wall rail) Ambulation/Gait Assistance: 2: Max assist Locomotion: Ambulation: 1: Travels less than 50 ft with maximal assistance (Pt: 25 - 49%)  Comprehension Comprehension Mode: Auditory Comprehension: 5-Follows basic conversation/direction: With no assist  Expression Expression Mode: Verbal Expression: 5-Expresses basic needs/ideas: With extra time/assistive device  Social Interaction Social Interaction: 6-Interacts appropriately with others with medication or extra time (anti-anxiety, antidepressant).  Problem Solving Problem Solving: 5-Solves basic 90% of the time/requires cueing < 10% of the time  Memory Memory Mode: Not assessed Memory: 5-Recognizes or recalls 90% of the time/requires cueing < 10% of the time  Medical Problem List and Plan: 1. Functional deficits secondary to status post lumbar decompression 01/17/2015 with postoperative right ACA, left thalamic infarcts.Has left hemiparesis Status post loop recorder -continue therapies---slow progress---pt still working hard  2. DVT Prophylaxis/Anticoagulation: continue sq heparin VTE risk  -no bleeding sequelae, platelets normal-dopplers negative 3. Pain Management:    -continued tone in LLE  -continue flexeril  q8 prn  -continue gabapentin  at 2100 for sleep, restless leg sx  - tramadol 25-50mg  q6 prn  -dc baclofen as it might be causing nausea   4. Hypertension. Lopressor 25 mg twice a day. Generally fair control at present 5. Neuropsych: This patient is not yet capable of making decisions on her own behalf. 6. Skin/Wound Care: Routine skin checks and local care to surgical site. Encourage adequate nutrition 7. Fluids/Electrolytes/Nutrition: continue potassium supplement  -other than sodium, labs look  good  -hyponatremia: sodium down to 128---recheck tomorrow, no FR right now 8. Hyperlipidemia. Pravachol 9. Neurogenic bladder:  Urine with  100k e coli s/p amoxil  -retention-foley   -continue low dose flomax also--HS 10. Sleep---schedule trazodone  qhs  -continue sleep chart to firmly document sleep pattern 11. Nausea: med induced? Moving bowels per RN. May have gas this morning also   LOS (Days) 18 A FACE TO FACE EVALUATION WAS PERFORMED  SWARTZ,ZACHARY T 02/07/2015 9:06 AM

## 2015-02-08 ENCOUNTER — Inpatient Hospital Stay (HOSPITAL_COMMUNITY): Payer: Medicare Other | Admitting: Physical Therapy

## 2015-02-08 ENCOUNTER — Inpatient Hospital Stay (HOSPITAL_COMMUNITY): Payer: Medicare Other | Admitting: Speech Pathology

## 2015-02-08 ENCOUNTER — Inpatient Hospital Stay (HOSPITAL_COMMUNITY): Payer: Medicare Other | Admitting: Occupational Therapy

## 2015-02-08 DIAGNOSIS — R05 Cough: Secondary | ICD-10-CM

## 2015-02-08 LAB — BASIC METABOLIC PANEL
ANION GAP: 6 (ref 5–15)
BUN: 10 mg/dL (ref 6–20)
CO2: 30 mmol/L (ref 22–32)
Calcium: 9.8 mg/dL (ref 8.9–10.3)
Chloride: 100 mmol/L — ABNORMAL LOW (ref 101–111)
Creatinine, Ser: 1.04 mg/dL — ABNORMAL HIGH (ref 0.44–1.00)
GFR, EST AFRICAN AMERICAN: 56 mL/min — AB (ref >60)
GFR, EST NON AFRICAN AMERICAN: 49 mL/min — AB (ref >60)
Glucose, Bld: 91 mg/dL (ref 65–99)
Potassium: 4.6 mmol/L (ref 3.5–5.1)
Sodium: 136 mmol/L (ref 135–145)

## 2015-02-08 LAB — TSH: TSH: 0.876 u[IU]/mL (ref 0.350–4.500)

## 2015-02-08 MED ORDER — SALINE SPRAY 0.65 % NA SOLN
1.0000 | NASAL | Status: DC | PRN
  Administered 2015-02-08 – 2015-02-09 (×2): 1 via NASAL
  Filled 2015-02-08: qty 44

## 2015-02-08 NOTE — Progress Notes (Signed)
Speech Language Pathology Daily Session Note  Patient Details  Name: Angela Keith MRN: [id number] Date of Birth: 02-14-1930  Today's Date: 02/08/2015 SLP Individual Time: 1000-1100 SLP Individual Time Calculation (min): 60 min  Short Term Goals: Week 3: SLP Short Term Goal 1 (Week 3): Patient will attend to left field of enviornment and body during familiar structured tasks with supervision multimodal cues  SLP Short Term Goal 2 (Week 3): Patient will select attention to basic, familar tasks for 45 minutes with supervision multimodal cues  SLP Short Term Goal 3 (Week 3): Patient will utilize call bell to request assistance in 75% of observable opportunities with supervision question cues.  SLP Short Term Goal 4 (Week 3): Patient will recall new, daily information with  supervision multimodal cues for use of external aids.  SLP Short Term Goal 5 (Week 3): Patient will demonstrate functional problem solving for mildy complex and familiar tasks with Min A multimodal cues.   Skilled Therapeutic Interventions: Skilled treatment session focused on cognitive goals. SLP facilitated session by providing supervision verbal cues for attention to left field of environment and [name] during a sorting task.  Patient completed a basic written expression task with Mod I and required supervision question and verbal cues for intellectual awareness in regards to goals and focus of each therapy discipline. Patient also recalled events from morning therapy sessions with supervision question cues. Patient left upright in wheelchair with all needs within reach. Continue with current plan of care.     FIM:  Comprehension Comprehension Mode: Auditory Comprehension: 5-Follows basic conversation/direction: With no assist Expression Expression Mode: Verbal Expression: 5-Expresses basic needs/ideas: With extra time/assistive device Social Interaction Social Interaction: 6-Interacts appropriately with others with  medication or extra time (anti-anxiety, antidepressant). Problem Solving Problem Solving: 5-Solves basic 90% of the time/requires cueing < 10% of the time Memory Memory: 5-Recognizes or recalls 90% of the time/requires cueing < 10% of the time  Pain Pain Assessment Pain Assessment: No/denies pain  Therapy/Group: Individual Therapy  Yetta Marceaux 02/08/2015, 12:02 PM

## 2015-02-08 NOTE — Progress Notes (Signed)
[facility] PHYSICAL MEDICINE & REHABILITATION     PROGRESS NOTE    Subjective/Complaints: Denies nausea, had some wheezing/cough yesterday afternoon,evening---no problems this morning.   ROS: limited due to cognition, hearing, language   Objective: Vital Signs: Blood pressure 121/71, pulse 75, temperature 98.3 F (36.8 C), temperature source Oral, resp. rate 17, weight 68.9 kg (151 lb 14.4 oz), SpO2 96 %. Dg Chest 2 View  02/07/2015   CLINICAL DATA:  Chronic cough.  EXAM: CHEST  2 VIEW  COMPARISON:  January 18, 2015  FINDINGS: There is no edema or consolidation. Heart is upper normal in size with pulmonary vascularity within normal limits. There is a hiatal type hernia. No adenopathy. There is atherosclerotic change in the aorta. Bones are osteoporotic. There are calcified breast implants bilaterally.  IMPRESSION: Hiatal hernia present.  No edema or consolidation.   Electronically Signed   By: Bretta Bang III M.D.   On: 02/07/2015 17:14    Recent Labs  02/07/15 0506  WBC 5.5  HGB 11.7*  HCT 35.3*  PLT 298    Recent Labs  02/07/15 0506 02/08/15 0539  NA 128* 136  K 4.5 4.6  CL 96* 100*  GLUCOSE 103* 91  BUN 11 10  CREATININE 0.84 1.04*  CALCIUM 9.5 9.8   CBG (last 3)  No results for input(s): GLUCAP in the last 72 hours.  Wt Readings from Last 3 Encounters:  02/08/15 68.9 kg (151 lb 14.4 oz)  01/17/15 71.215 kg (157 lb)  01/11/15 71.385 kg (157 lb 6 oz)    Physical Exam:  Constitutional:  NAD Eyes: EOM are normal.  Mouth: dentition fair, oral mucosa moist Neck: Normal range of motion. Neck supple. No thyromegaly present.  Cardiovascular: Normal rate and regular rhythm.no murmurs or rubs  Respiratory: Effort normal and breath sounds normal. No respiratory distress. no wheezes GI: Soft. Bowel sounds are decreased. She exhibits no distension.  Neurological: She is alert and attentive.  HOH with improving insight and awareness. [name] spastic (1+/4)  1+ to  2/5 strength proximal to distal--improving apraxia.Marland Kitchen LLE:  Tone 1+/4 today hamstrings and calf, heel cord tight still. MMT: 2 hf, 2+ke and 1 adf/apf. improving inattention to left.  DTR's 1+ right and 2+ left. No visible spasms or clonus Musc:  No ankle or foot swelling no pain with ROM, skin abrasion Left lat malleolus healing Skin: Skin is warm and dry.  Psychiatric: She has a normal mood and affect. Very alert and animated today   Assessment/Plan: 1. Functional deficits secondary to right ACA and left thalamic infarcts after recent lumbar decompression which require 3+ hours per day of interdisciplinary therapy in a comprehensive inpatient rehab setting. Physiatrist is providing close team supervision and 24 hour management of active medical problems listed below. Physiatrist and rehab team continue to assess barriers to discharge/monitor patient progress toward functional and medical goals. FIM: FIM - Bathing Bathing Steps Patient Completed: Chest, Right Arm, Left Arm, Abdomen, Front perineal area, Right upper leg, Left upper leg, Right lower leg (including foot), Left lower leg (including foot) Bathing: 4: Min-Patient completes 8-9 90f 10 parts or 75+ percent  FIM - Upper Body Dressing/Undressing Upper body dressing/undressing steps patient completed: Thread/unthread right sleeve of pullover shirt/dresss, Thread/unthread left sleeve of pullover shirt/dress, Put head through opening of pull over shirt/dress Upper body dressing/undressing: 4: Min-Patient completed 75 plus % of tasks FIM - Lower Body Dressing/Undressing Lower body dressing/undressing steps patient completed: Thread/unthread right underwear leg, Thread/unthread left underwear leg, Thread/unthread  right pants leg, Thread/unthread left pants leg, Don/Doff right shoe Lower body dressing/undressing: 3: Mod-Patient completed 50-74% of tasks  FIM - Toileting Toileting steps completed by patient: Adjust clothing prior to toileting,  Adjust clothing after toileting Toileting Assistive Devices: Grab bar or rail for support Toileting: 3: Mod-Patient completed 2 of 3 steps  FIM - Diplomatic Services operational officer Devices: Elevated toilet seat Toilet Transfers: 3-To toilet/BSC: Mod A (lift or lower assist)  FIM - Banker Devices: Orthosis Bed/Chair Transfer: 3: Supine > Sit: Mod A (lifting assist/Pt. 50-74%/lift 2 legs, 3: Chair or W/C > Bed: Mod A (lift or lower assist)  FIM - Locomotion: Wheelchair Distance: 150 Locomotion: Wheelchair: 2: Travels 50 - 149 ft with supervision, cueing or coaxing FIM - Locomotion: Ambulation Locomotion: Ambulation Assistive Devices: Orthosis, Other (comment) (wall rail) Ambulation/Gait Assistance: 2: Max assist Locomotion: Ambulation: 1: Travels less than 50 ft with maximal assistance (Pt: 25 - 49%)  Comprehension Comprehension Mode: Auditory Comprehension: 5-Follows basic conversation/direction: With no assist  Expression Expression Mode: Verbal Expression: 5-Expresses basic needs/ideas: With extra time/assistive device  Social Interaction Social Interaction: 6-Interacts appropriately with others with medication or extra time (anti-anxiety, antidepressant).  Problem Solving Problem Solving: 5-Solves basic 90% of the time/requires cueing < 10% of the time  Memory Memory Mode: Not assessed Memory: 5-Recognizes or recalls 90% of the time/requires cueing < 10% of the time  Medical Problem List and Plan: 1. Functional deficits secondary to status post lumbar decompression 01/17/2015 with postoperative right ACA, left thalamic infarcts.Has left hemiparesis Status post loop recorder -continue therapies---slow progress but she is quite determined  -probably will need placement  2. DVT Prophylaxis/Anticoagulation: continue sq heparin VTE risk LLE  -no bleeding sequelae, platelets normal  -dopplers negative 3. Pain Management:     -continued tone in LLE  -continue flexeril 5mg  q8 prn  -continue gabapentin 300mg  at 2100 for sleep, restless leg sx  - tramadol 25-50mg  q6 prn  -dc'ed baclofen d/t nausea   4. Hypertension. Lopressor 25 mg twice a day. bp control is improved 5. Neuropsych: This patient is potentially capable of making decisions on her own behalf. 6. Skin/Wound Care: Routine skin checks and local care to surgical site. Encourage adequate nutrition 7. Fluids/Electrolytes/Nutrition: continue potassium supplement  -labs reviewed in full by me  -hyponatremia: sodium yesterday---now 136 today!!!! Without any modifications 8. Hyperlipidemia. Pravachol 9. Neurogenic bladder:  Urine with  100k e coli s/p amoxil  -retention-foley   -continue low dose flomax also--HS 10. Sleep---scheduled trazodone 25mg  qhs  -continue sleep chart to firmly document sleep pattern-sleeping better 11. Nausea: med induced? Seems better after dc of baclofen 12. Wheezing/cough: chest xray personally reviewed by me and clear  -auscultation clear also  -prn robitussin,inhaler for cough or wheezing   LOS (Days) 19 A FACE TO FACE EVALUATION WAS PERFORMED  Brylin Stopper T 02/08/2015 8:07 AM

## 2015-02-08 NOTE — Progress Notes (Addendum)
Physical Therapy Session Note  Patient Details  Name: Angela Keith MRN: [id number] Date of Birth: Aug 25, 1929  Today's Date: 02/08/2015 PT Individual Time: 1401-1505 PT Individual Time Calculation (min): 64 min   Short Term Goals: Week 3:  PT Short Term Goal 1 (Week 3): STGs = LTGs due to ELOS  Skilled Therapeutic Interventions/Progress Updates:    Gait Training: PT instructed patient in gait using L ankle ace wrap with DF assist and HW with mod A to facilitate weight shift, LLE advancement and and L knee extension. Pt amb 25' x2. Pt improving with trunk control and requiring less facilitation at L knee with weight bearing.   Therapeutic Activity: Pt performed supine>sit transfer with min A for management of LLE and mod A to elevate trunk. Min A to scoot to EOB. Pt transferred bed>w/c towards R with mod A for squat pivot. Pt able to propel w/c to therapy gym with supervision and verbal cues for obstacle negotiation.   NMR: Pt participated in LE/UE mirror therapy focusing on drawing attention to L side and overflow from RUE to [name] with grasp/release. Pt performed 50 reps x3 of ankle pumps, 50 reps x2 of knee flex/ext (foot sliding on pillowcase) RLE while looking at "left" LE in mirror.  Pt performed grasp/release exercises with R/L hand with and without objects in hands.   Pt continues to make progress with PT and demonstrates decreased need for assist for bed mobility, transfers, and gait. Would continue to benefit from skilled therapy for NMR, strengthening, and progression of independence with functional mobility.   Therapy Documentation Precautions:  Precautions Precautions: Fall, Back Precaution Comments: daughter brought in lumbar corset from home for comfort, L inattention, pusher to the left Restrictions Weight Bearing Restrictions: No  Pain: Pain Assessment Pain Assessment: No/denies pain  Locomotion : Ambulation Ambulation/Gait Assistance: 3: Mod assist Wheelchair  Mobility Distance: 150   See FIM for current functional status  Therapy/Group: Individual Therapy  Teodoro Kil, PT, DPT   02/08/2015, 3:24 PM

## 2015-02-08 NOTE — Progress Notes (Signed)
Physical Therapy Session Note  Patient Details  Name: Angela Keith MRN: [id number] Date of Birth: 02-Feb-1930  Today's Date: 02/08/2015 PT Individual Time:  -  900-950  Treatment Session 1: 50 min      Short Term Goals: Week 3:  PT Short Term Goal 1 (Week 3): STGs = LTGs due to ELOS  Skilled Therapeutic Interventions/Progress Updates:    Treatment Session 1: Gait Training: PT instructs pt in ambulation with R wall rail x 30' req mod A for stabilizing L knee from buckling in stance, assist in L foot placement with swing, and faciliation in weight shift at hips - L ankle DF ace-wrap assist.  PT instructs pt in ambulation with L wall rail x 30' req mod A x 2 for safety: one PT providing R hand hold assist - focus on forward weight shift, upright posture, facilitation in weight shift to the R; other PT focus on blocking L knee from buckling in stance, facilitating L glute max in stance to achieve upright posture, and reminders for attention to the L to progress L hand.   Neuromuscular Reeducation: PT instructs pt in sit to stand from [facility] with B hands on PT's knees req mod-max A for L LE facilitation and upright posture.  PT instructs pt in L LE NMR techniques: bridges x 30 reps, B hip IR with PT resisting R knee in B hook lie x 10 reps - pt demonstrates difficulty understanding this exercise, but gradually demonstrates understanding.  PT instructs pt in dynamic sit balance activities reaching with each arm towards PT's hands in many directions req SBA.   W/C Management: PT instructs pt in w/c propulsion with R UE/LE 150' x 2 reps req minimal cues for attention to obstacles to the L side and SBA - pt hits one obstacle with L footplate during this activity. Improved straight line pathway noted during today's session compared to previous week. Pt req pathfinding cues for return trip from gym to room.   Pt's gait with R wall rail is improving immensely, but transfers and mat mobility continue to  be difficult. Static and dynamic sit balance are also improving significantly, but pt continues to need to be placed in sit midline due to strong pushing L with R side tendency. Continue per PT POC.    Therapy Documentation Precautions:  Precautions Precautions: Fall, Back Precaution Comments: daughter brought in lumbar corset from home for comfort, L inattention, pusher to the left Restrictions Weight Bearing Restrictions: No Pain: Pain Assessment Pain Assessment: No/denies pain   See FIM for current functional status  Therapy/Group: Individual Therapy  Cosandra Plouffe M 02/08/2015, 8:33 AM

## 2015-02-08 NOTE — Progress Notes (Signed)
Occupational Therapy Session Note  Patient Details  Name: Angela Keith MRN: [id number] Date of Birth: 1930/01/31  Today's Date: 02/08/2015 OT Individual Time: 4076-8088 OT Individual Time Calculation (min): 45 min    Short Term Goals: Week 2:  OT Short Term Goal 1 (Week 2): Pt will complete bathing seated with mod assist and min cues (verbal, environmental or gestural) to maintain neutral sitting posture. OT Short Term Goal 1 - Progress (Week 2): Met OT Short Term Goal 2 (Week 2): Pt will demo ability to complete stand-pivot transfer, bed<>w/c with mod assist OT Short Term Goal 2 - Progress (Week 2): Met OT Short Term Goal 3 (Week 2): Pt will improve functional use of left hand during BADL with min assist to release grasp OT Short Term Goal 3 - Progress (Week 2): Progressing toward goal OT Short Term Goal 4 (Week 2): Pt will dress upper body with min assist OT Short Term Goal 4 - Progress (Week 2): Not progressing OT Short Term Goal 5 (Week 2): Pt will dress lower body with mod assist OT Short Term Goal 5 - Progress (Week 2): Progressing toward goal Week 3:  OT Short Term Goal 1 (Week 3): Pt will demo ability to complete lower body bathing, supine with mod assist. OT Short Term Goal 2 (Week 3): Pt will demo ability to pull up pants over right hip while seated using lateral leans OT Short Term Goal 3 (Week 3): Pt will wash upper body seated at sink with min assist OT Short Term Goal 4 (Week 3): Pt will groom, 3 of 5 tasks, with setup and supervision  Skilled Therapeutic Interventions/Progress Updates:    1:1 self care retraining at sink level (pt's choice). Focused on bed mobility with recall of sequence with tactile cues, sit to stand, progressed to standing balance without UE support and ability to pull up pants with both hands, functional use of left hand, functional problem solving, management of left Le for LB dressing, and visual attention to left and to left hand. Pt with improved  standing balance today in order to be successful with clothing management with both hands. Pt did require left hip and knee facilitation to maintain activation and for visual feedback in mirror for posture at midline. Pt left to finish breakfast.   Therapy Documentation Precautions:  Precautions Precautions: Fall, Back Precaution Comments: daughter brought in lumbar corset from home for comfort, L inattention, pusher to the left Restrictions Weight Bearing Restrictions: No Pain: Pain Assessment Pain Assessment: No/denies pain  See FIM for current functional status  Therapy/Group: Individual Therapy  Willeen Cass [facility] 02/08/2015, 11:38 AM

## 2015-02-09 ENCOUNTER — Inpatient Hospital Stay (HOSPITAL_COMMUNITY): Payer: Medicare Other

## 2015-02-09 ENCOUNTER — Inpatient Hospital Stay (HOSPITAL_COMMUNITY): Payer: Medicare Other | Admitting: Physical Therapy

## 2015-02-09 ENCOUNTER — Inpatient Hospital Stay (HOSPITAL_COMMUNITY): Payer: Medicare Other | Admitting: Occupational Therapy

## 2015-02-09 ENCOUNTER — Inpatient Hospital Stay (HOSPITAL_COMMUNITY): Payer: Medicare Other | Admitting: Speech Pathology

## 2015-02-09 MED ORDER — ENOXAPARIN SODIUM 40 MG/0.4ML ~~LOC~~ SOLN
40.0000 mg | SUBCUTANEOUS | Status: DC
Start: 2015-02-09 — End: 2015-02-09

## 2015-02-09 MED ORDER — HEPARIN SODIUM (PORCINE) 5000 UNIT/ML IJ SOLN
5000.0000 [IU] | Freq: Three times a day (TID) | INTRAMUSCULAR | Status: DC
  Administered 2015-02-09 – 2015-02-10 (×3): 5000 [IU] via SUBCUTANEOUS
  Filled 2015-02-09 (×3): qty 1

## 2015-02-09 MED ORDER — TRAZODONE HCL 50 MG PO TABS: 25.0000 mg | ORAL_TABLET | Freq: Every day | ORAL | Status: DC

## 2015-02-09 MED ORDER — LORATADINE 10 MG PO TABS
10.0000 mg | ORAL_TABLET | Freq: Every day | ORAL | Status: DC
  Administered 2015-02-09 – 2015-02-10 (×2): 10 mg via ORAL
  Filled 2015-02-09 (×2): qty 1

## 2015-02-09 MED ORDER — BENZONATATE 100 MG PO CAPS
100.0000 mg | ORAL_CAPSULE | Freq: Three times a day (TID) | ORAL | Status: DC | PRN
  Administered 2015-02-09: 100 mg via ORAL
  Filled 2015-02-09: qty 1

## 2015-02-09 MED ORDER — CYCLOBENZAPRINE HCL 5 MG PO TABS: 5.0000 mg | ORAL_TABLET | Freq: Three times a day (TID) | ORAL | Status: DC | PRN

## 2015-02-09 MED ORDER — TRAMADOL HCL 50 MG PO TABS: 25.0000 mg | ORAL_TABLET | Freq: Four times a day (QID) | ORAL | Status: DC | PRN

## 2015-02-09 NOTE — Discharge Summary (Signed)
Discharge summary job # [id number]

## 2015-02-09 NOTE — Discharge Summary (Signed)
Angela Keith, Angela Keith                ACCOUNT NO.:  [id number]  MEDICAL RECORD NO.:  [id number]  LOCATION:  4W23C                        FACILITY:  [facility]  PHYSICIAN:  Mariam Dollar, P.A.  DATE OF BIRTH:  Sep 13, 1929  DATE OF ADMISSION:  01/20/2015 DATE OF DISCHARGE:  02/10/2015                              DISCHARGE SUMMARY   DISCHARGE DIAGNOSES: 1. Functional deficits, secondary to lumbar decompression on January 17, 2015, with postoperative right anterior cerebral artery (ACA), left     thalamic infarct.  Status post loop recorder. 2. Subcutaneous heparin for DVT prophylaxis. 3. Pain management with spasticity. 4. Hypertension. 5. Hyperlipidemia. 6. Neurogenic bladder. 7. Insomnia.  HISTORY OF PRESENT ILLNESS:  This is an 81 year old right-handed female with history of TIA versus CVA in March 2016 with MRI negative, placed on dual antiplatelet therapy with carotid Dopplers and echocardiogram unremarkable as well as recent lumbar L2, L3, and L4 decompressive laminectomy for radiculopathy on January 17, 2015, per Dr. Jordan Likes.  She presented on January 18, 2015, after recently being discharged to home after back surgery with left-sided weakness.  MRI/MRA of the brain showed right distal ACA segment occlusion with acute posterior right ACA infarct as well as punctate acute infarct medial left thalamus and no mass affect.  The patient did not receive tPA.  Cardiology consulted with attempts at TEE, but was aborted because unable to pass the ultrasound probe after several attempts.  She did undergo loop recorder placement on January 19, 2015.  Maintained on Plavix for CVA prophylaxis. Physical and occupational therapy ongoing.  The patient was admitted for comprehensive rehab program.  PAST MEDICAL HISTORY:  See discharge diagnoses.  SOCIAL HISTORY:  Lives alone, used a walker prior to admission.  FUNCTIONAL STATUS:  Upon admission to Rehab Services was +2 physical assist for rolling, +2  physical assist for sit to side-lying, mod max assist activities of daily living.  PHYSICAL EXAMINATION:  VITAL SIGNS:  Blood pressure 118/54, pulse 79, temperature 98, and respirations 16. GENERAL:  This was an alert female, she did have hearing aids in place. She was able to provide her name and place, followed simple commands. Limited insight and awareness of her deficits. LUNGS:  Clear to auscultation. CARDIAC:  Regular rate and rhythm. ABDOMEN:  Soft and nontender.  Good bowel sounds. EXTREMITIES:  She did show some left lower extremity spasticity and apraxia.  REHABILITATION HOSPITAL COURSE:  The patient was admitted to inpatient rehab services with therapies initiated on a 3-hour daily basis, consisting of physical therapy, occupational therapy, speech therapy, and rehabilitation nursing.  The following issues were addressed during the patient's rehabilitation stay.  Pertaining to Mrs. Ante's right ACA, left thalamic infarct after recent lumbar decompression, she remained on Plavix therapy.  She had undergone loop recorder.  No chest pain or shortness of breath.  Maintained on Lovenox for DVT prophylaxis. Venous Doppler studies negative.  Blood pressures well controlled.  No orthostatic changes on Lopressor.  She was treated with a 7-day course of amoxicillin for E. coli urinary tract infection, bouts of ongoing urinary retention.  She had ultimately refused intermittent catheterization.  A Foley catheter tube was placed as  well as low-dose Flomax.  She would continue Foley catheter tube.  Pain management. Spasticity.  Attempts at Baclofen discontinued due to nausea and some increased lethargy.  Flexeril was added as needed as well as Neurontin 300 mg at bedtime and she was using tramadol as needed for pain with good results.  All issues in regard to her pain and spasticity were discussed at length with family.  Trazodone was added to help aid in her sleep with good results  and close monitoring of mental status.  The patient received weekly collaborative interdisciplinary team conferences to discuss estimated length of stay, family teaching, and any barriers to her discharge.  Ambulate 25 feet x2.  Mod max assist.  Performed supine to sit transfers, with minimal assist for management, moderate assist to elevate her trunk, and minimal assist to scoot to the edge of the bed.  Transferred bed-to-wheelchair towards the right, moderate assist squat and pivot transfers.  She is able to propel her wheelchair to therapy begin with supination and verbal cues for obstacle negotiation.  Activities of daily living, home making.  She transferred from bed to wheelchair to the left, transitioned into the bathroom to take a shower, transferred to the tub bench using grab bars, used metal shower rods to help aid in mobility.  She was able to bathe upper body moderate cuing and lower body with a long-handled sponge.  She is followed by speech therapy for some decrease in recall and attention to the left side of the environment.  She can complete basic written expression tasks with modified independence, required supervision question for verbal cuing.  The patient with slow progressive gains.  It was requested by family need for skilled nursing facility with bed becoming available on March 02, 2015,  DISCHARGE MEDICATIONS:  Included; 1. Plavix 75 mg p.o. daily. 2. Flexeril 5 mg p.o. t.i.d. as needed muscle spasms. 3. Cosopt ophthalmic solution 1 drop both eyes twice daily. 4. Neurontin 300 mg p.o. at bedtime. 5. Robitussin syrup 200 mg every 6 hours as needed cough. 6. Xalatan ophthalmic solution 0.05% 1 drop both eyes at bedtime. 7. Synthroid 88 mcg p.o. daily. 8. Claritin 10 mg p.o. daily. 9. Lopressor 25 mg p.o. b.i.d. 10.Protonix 40 mg p.o. b.i.d. 11.Pravachol 40 mg p.o. daily. 12.Ocean nasal spray 1 spray each nostril as needed. 13.Flomax 0.4 mg p.o. after  supper. 14.Ultram 25-50 mg every 6 hours as needed pain. 15.Trazodone 25 mg p.o. at bedtime.  DIET:  Regular consistency.  SPECIAL INSTRUCTIONS:  Back corset for comfort only.  The patient should follow up with Dr. Jordan Likes, neurosurgery, call for appointment; Dr. [name], 1 month call for appointment; Dr. Harold Hedge on April 04, 2015, arrival at 10:20 a.m.  Continue Foley catheter tube change routinely every 2 weeks.     Mariam Dollar, P.A.     DA/MEDQ  D:  02/09/2015  T:  02/09/2015  Job:  [id number]  cc:   Pramod P. [name], MD Kathaleen Maser Pool, M.D.

## 2015-02-09 NOTE — Progress Notes (Signed)
Social Work Patient ID: Angela Keith, female   DOB: March 20, 1930, 81 y.o.   MRN: [id number]   Nigel Sloop, LCSW Social Worker Signed  Patient Care Conference 02/09/2015 10:16 AM    Expand All Collapse All   Inpatient RehabilitationTeam Conference and Plan of Care Update Date: 02/07/2015   Time: 2:00 PM     Patient Name: Angela Keith       Medical Record Number: [id number]  Date of Birth: Oct 26, 1929 Sex: Female         Room/Bed: 4W23C/4W23C-01 Payor Info: Payor: MEDICARE / Plan: MEDICARE PART A AND B / Product Type: *No Product type* /    Admitting Diagnosis: lumbar decomp sullivan   Admit Date/Time:  01/20/2015  4:28 PM Admission Comments: No comment available   Primary Diagnosis:  Cerebral infarction due to thrombosis of cerebral artery Principal Problem: Cerebral infarction due to thrombosis of cerebral artery    Patient Active Problem List     Diagnosis  Date Noted   .  Left hemiparesis     .  Cryptogenic stroke     .  H/O TIA (transient ischemic attack) and stroke     .  Spinal stenosis, lumbar region, with neurogenic claudication  01/17/2015   .  Lumbar stenosis  01/17/2015   .  Hemispheric carotid artery syndrome  11/10/2014   .  Essential hypertension  11/10/2014   .  HLD (hyperlipidemia)  11/10/2014   .  Back pain with left-sided radiculopathy  11/08/2014   .  Cerebral infarction due to thrombosis of cerebral artery     .  Hyperlipidemia LDL goal <70  09/07/2014   .  Presumed CVA (cerebral infarction) s/p IV tPA, MRI negative  09/05/2014   .  Hypertensive urgency  09/05/2014   .  Right bundle branch block  03/21/2014   .  Cough  10/31/2012   .  Nausea vomiting and diarrhea  10/30/2012   .  Elevated lipase  10/30/2012   .  HTN (hypertension)  10/30/2012   .  Unspecified hypothyroidism  10/30/2012     Expected Discharge Date: Expected Discharge Date:  (SNF)  Team Members Present: Physician leading conference: Dr. Faith Rogue Social Worker Present: Staci Acosta, LCSW Nurse Present: Carmie End, RN PT Present: Karolee Stamps, Varney Biles, PT OT Present: Donzetta Kohut, OT;Other (comment) Johnsie Cancel, OT) SLP Present: Feliberto Gottron, SLP Other (Discipline and Name): Ottie Glazier, RN [facility]) PPS Coordinator present : Tora Duck, RN, [facility]        Current Status/Progress  Goal  Weekly Team Focus   Medical     making progress. spasticity mgt, occasional nausea, sleep better  spasticity improvement  spasms, pain control   Bowel/Bladder     Continent of bowel. LBM 02/05/15. Foley cath infusing clear, yellow urine  Min A with bladder  Monitor for appropriate removal of foley, and reinitiate time toileting   Swallow/Nutrition/ Hydration               ADL's     Overall Mod A for upper body ADL, Max A for lower body, Mod-Max for squat/stand-pivot transfers  Overall Mod Assist for BADL and transfers (LTG downgraded)  NMR of [name], improved sitting balance, transfers, visual attention to left, and improved independence with repositioning while seated.    Mobility     Max A transfers and gait, mod A w/c mobility   Goals downgraded to mod A overall    Initiation/sequencing, NMR and trunk/postural control,  transfers, gait and activation of L side   Communication               Safety/Cognition/ Behavioral Observations    Mod A  Min A  attention, recall, attention to left, problem solving, awareness     Pain     c/o discomfort to RLE. manged with exercise and scheduled blacofen   <4  Assess for effectivness of pain medication. Offer prn, as needed    Skin     Lumbar incision with honeycomb dressing, CDI. L lateral ankle with skin tear and foam dessing intact  No additional skin breakdown  Assess skin q shift    Rehab Goals Patient on target to meet rehab goals: Yes Rehab Goals Revised: none *See Care Plan and progress notes for long and short-term goals.    Barriers to Discharge:  continued profound deficits     Possible Resolutions to  Barriers:   continued care after discharge, ?SNF      Discharge Planning/Teaching Needs:   Pt and family have decided it would be best for pt to go for SNF care prior to trying to go home to give her body longer to rehabilitate.  Family has been present for therapies.  They are now focusing their efforts on finding a SNF for pt.    Team Discussion:    Pt has some spasticity, pain, nausea, but is doing better now.  MD questioned if it was medication related, so he made changes.  Pt looks better today and has a lot of "spunk".  Pt continues to work hard and has intermittent return to her left leg.  It's hard for pt to make her left hand work for ADLs, but she is trying.  Pt is doing well cognitively and ST is working on increasing awareness of left upper extremity.  Pt is down to 1 person for transfers and is mod to max assist overall.   Revisions to Treatment Plan:    none    Continued Need for Acute Rehabilitation Level of Care: The patient requires daily medical management by a physician with specialized training in physical medicine and rehabilitation for the following conditions: Daily direction of a multidisciplinary physical rehabilitation program to ensure safe treatment while eliciting the highest outcome that is of practical value to the patient.: Yes Daily medical management of patient stability for increased activity during participation in an intensive rehabilitation regime.: Yes Daily analysis of laboratory values and/or radiology reports with any subsequent need for medication adjustment of medical intervention for : Other;Neurological problems  Sathvik Tiedt, [place] De[place] 02/09/2015, 10:16 AM

## 2015-02-09 NOTE — Patient Care Conference (Signed)
Inpatient RehabilitationTeam Conference and Plan of Care Update Date: 02/07/2015   Time: 2:00 PM    Patient Name: Angela Keith      Medical Record Number: [id number]  Date of Birth: 02/17/1930 Sex: Female         Room/Bed: 4W23C/4W23C-01 Payor Info: Payor: MEDICARE / Plan: MEDICARE PART A AND B / Product Type: *No Product type* /    Admitting Diagnosis: lumbar decomp sullivan  Admit Date/Time:  01/20/2015  4:28 PM Admission Comments: No comment available   Primary Diagnosis:  Cerebral infarction due to thrombosis of cerebral artery Principal Problem: Cerebral infarction due to thrombosis of cerebral artery  Patient Active Problem List   Diagnosis Date Noted  . Left hemiparesis   . Cryptogenic stroke   . H/O TIA (transient ischemic attack) and stroke   . Spinal stenosis, lumbar region, with neurogenic claudication 01/17/2015  . Lumbar stenosis 01/17/2015  . 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 Essential hypertension 11/10/2014  . HLD (hyperlipidemia) 11/10/2014  . Back pain with left-sided radiculopathy 11/08/2014  . Cerebral infarction due to thrombosis of cerebral artery   . Hyperlipidemia LDL goal <70 09/07/2014  . Presumed CVA (cerebral infarction) s/p IV tPA, MRI negative 09/05/2014  . Hypertensive urgency 09/05/2014  . Right bundle branch block 03/21/2014  . Cough 10/31/2012  . Nausea vomiting and diarrhea 10/30/2012  . Elevated lipase 10/30/2012  . HTN (hypertension) 10/30/2012  . Unspecified hypothyroidism 10/30/2012    Expected Discharge Date: Expected Discharge Date:  (SNF)  Team Members Present: Physician leading conference: Dr. Faith Rogue Social Worker Present: Staci Acosta, LCSW Nurse Present: Carmie End, RN PT Present: Karolee Stamps, Varney Biles, PT OT Present: Donzetta Kohut, OT;Other (comment) Johnsie Cancel, OT) SLP Present: Feliberto Gottron, SLP Other (Discipline and Name): Ottie Glazier, RN [facility]) PPS Coordinator present : Tora Duck,  RN, [facility]     Current Status/Progress Goal Weekly Team Focus  Medical   making progress. spasticity mgt, occasional nausea, sleep better  spasticity improvement  spasms, pain control   Bowel/Bladder   Continent of bowel. LBM 02/05/15. Foley cath infusing clear, yellow urine  Min A with bladder  Monitor for appropriate removal of foley, and reinitiate time toileting   Swallow/Nutrition/ Hydration             ADL's   Overall Mod A for upper body ADL, Max A for lower body, Mod-Max for squat/stand-pivot transfers  Overall Mod Assist for BADL and transfers (LTG downgraded)  NMR of [name], improved sitting balance, transfers, visual attention to left, and improved independence with repositioning while seated.   Mobility   Max A transfers and gait, mod A w/c mobility  Goals downgraded to mod A overall   Initiation/sequencing, NMR and trunk/postural control, transfers, gait and activation of L side   Communication             Safety/Cognition/ Behavioral Observations  Mod A  Min A  attention, recall, attention to left, problem solving, awareness    Pain   c/o discomfort to RLE. manged with exercise and scheduled blacofen   <4  Assess for effectivness of pain medication. Offer prn, as needed   Skin   Lumbar incision with honeycomb dressing, CDI. L lateral ankle with skin tear and foam dessing intact  No additional skin breakdown  Assess skin q shift    Rehab Goals Patient on target to meet rehab goals: Yes Rehab Goals Revised: none *See Care Plan and progress notes for long and short-term  goals.  Barriers to Discharge: continued profound deficits    Possible Resolutions to Barriers:  continued care after discharge, ?SNF    Discharge Planning/Teaching Needs:  Pt and family have decided it would be best for pt to go for SNF care prior to trying to go home to give her body longer to rehabilitate.  Family has been present for therapies.  They are now focusing their efforts on finding a  SNF for pt.   Team Discussion:  Pt has some spasticity, pain, nausea, but is doing better now.  MD questioned if it was medication related, so he made changes.  Pt looks better today and has a lot of "spunk".  Pt continues to work hard and has intermittent return to her left leg.  It's hard for pt to make her left hand work for ADLs, but she is trying.  Pt is doing well cognitively and ST is working on increasing awareness of left upper extremity.  Pt is down to 1 person for transfers and is mod to max assist overall.  Revisions to Treatment Plan:  none   Continued Need for Acute Rehabilitation Level of Care: The patient requires daily medical management by a physician with specialized training in physical medicine and rehabilitation for the following conditions: Daily direction of a multidisciplinary physical rehabilitation program to ensure safe treatment while eliciting the highest outcome that is of practical value to the patient.: Yes Daily medical management of patient stability for increased activity during participation in an intensive rehabilitation regime.: Yes Daily analysis of laboratory values and/or radiology reports with any subsequent need for medication adjustment of medical intervention for : Other;Neurological problems  Chanc Kervin, [place] 02/09/2015, 10:16 AM

## 2015-02-09 NOTE — Progress Notes (Signed)
Speech Language Pathology Daily Session Note  Patient Details  Name: Angela Keith MRN: [id number] Date of Birth: 28-Apr-1930  Today's Date: 02/09/2015 SLP Individual Time: 1000-1100 SLP Individual Time Calculation (min): 60 min  Short Term Goals: Week 3: SLP Short Term Goal 1 (Week 3): Patient will attend to left field of enviornment and body during familiar structured tasks with supervision multimodal cues  SLP Short Term Goal 2 (Week 3): Patient will select attention to basic, familar tasks for 45 minutes with supervision multimodal cues  SLP Short Term Goal 3 (Week 3): Patient will utilize call bell to request assistance in 75% of observable opportunities with supervision question cues.  SLP Short Term Goal 4 (Week 3): Patient will recall new, daily information with  supervision multimodal cues for use of external aids.  SLP Short Term Goal 5 (Week 3): Patient will demonstrate functional problem solving for mildy complex and familiar tasks with Min A multimodal cues.   Skilled Therapeutic Interventions: Skilled treatment session focused on cognitive goals. Upon arrival, patient was emotional and tearful, suspect due to increased awareness of physical deficits, SLP provided emotional support. SLP also facilitated session by providing supervision verbal cues for attention to left field of environment and Min A for organization during a structured, familiar task. Patient requested to lie down at end of session, therefore, patient was transferred back to bed. Patient left supine in bed with alarm on and all needs within reach. Continue with current plan of care.    FIM:  Comprehension Comprehension Mode: Auditory Comprehension: 5-Follows basic conversation/direction: With extra time/assistive device Expression Expression Mode: Verbal Expression: 5-Expresses basic needs/ideas: With extra time/assistive device Social Interaction Social Interaction: 6-Interacts appropriately with others with  medication or extra time (anti-anxiety, antidepressant). Problem Solving Problem Solving: 5-Solves basic 90% of the time/requires cueing < 10% of the time Memory Memory: 5-Recognizes or recalls 90% of the time/requires cueing < 10% of the time FIM - Eating Eating Activity: 5: Set-up assist for open containers  Pain Pain Assessment Pain Assessment: No/denies pain  Therapy/Group: Individual Therapy  Jontae Adebayo 02/09/2015, 1:21 PM

## 2015-02-09 NOTE — Progress Notes (Signed)
Physical Therapy Discharge Summary  Patient Details  Name: Angela Keith MRN: [id number] Date of Birth: [birth year]  Today's Date: 02/09/2015 PT Individual Time: 1430-1505 PT Individual Time Calculation (min): 35 min    Patient has met 5 of 5 long term goals due to improved activity tolerance, improved balance, increased strength, ability to compensate for deficits, improved attention and improved awareness.  Patient to discharge at a wheelchair level Supervision.   Patient's care partner unavailable to provide the necessary physical assistance at discharge, patient to d/c to SNF.   Recommendation:  Patient will benefit from ongoing skilled PT services in skilled nursing facility setting to continue to advance safe functional mobility, address ongoing impairments in strength, muscle tone, L neglect, balance, ROM, and motor planning, and minimize fall risk.  Equipment: No equipment provided  Reasons for discharge: treatment goals met, d/c to SNF for further therapy.   Patient/family agrees with progress made and goals achieved: Yes   Skilled PT This Session: Pt received supine in bed with daughter and grand daughter present at bedside. Pt agreeable to therapy. Pt transferred to EOB with mod A to elevate trunk and to manage LLE. Pt transferred EOB to w/c towards the R with mod A and verbal cues for hand placement. Pt taken to therapy gym in w/c and Bioness set up for L dorsiflexion/eversion. Pt participated in NMR activity kicking a ball with FES activation of anterior tibialis to simulate pre-ambulation. Pt returned to room at end of session and transferred w/c>bed (R) with mod A and cues for hand placement. Pt returned to supine with mod A. Left with daughter at bedside and needs in reach.   PT Discharge Precautions/Restrictions Precautions Precautions: Fall;Back Precaution Comments: daughter brought in lumbar corset from home for comfort, L inattention, pusher to the left Required  Braces or Orthoses:  (LSO for comfort, L AFO (toe off)) Restrictions Weight Bearing Restrictions: No  Pain Pain Assessment Pain Assessment: No/denies pain  Vision/Perception  Vision - Assessment Eye Alignment: Within Functional Limits Alignment/Gaze Preference: Gaze right Convergence: Within functional limits Perception Comments: improved attention to L visual field compared to evaluation Praxis Praxis-Other Comments: improved compared to evaluation   Cognition Arousal/Alertness: Awake/alert Orientation Level: Oriented to person;Oriented to place;Oriented to situation;Disoriented to time Attention: Focused;Sustained;Selective;Alternating Focused Attention: Appears intact Sustained Attention: Appears intact Selective Attention: Appears intact Alternating Attention: Impaired Alternating Attention Impairment: Functional basic Memory: Impaired Memory Impairment: Retrieval deficit;Decreased recall of new information;Storage deficit Awareness: Impaired Awareness Impairment: Emergent impairment Problem Solving: Impaired Problem Solving Impairment: Functional complex Comments: safety impaired due to inattention to left and poor body positioning  Sensation Sensation Light Touch: Impaired Detail Light Touch Impaired Details: Impaired LLE;Impaired [name] Stereognosis: Impaired Detail Stereognosis Impaired Details: Impaired [name] Hot/Cold: Appears Intact Proprioception: Impaired Detail Proprioception Impaired Details: Impaired LLE;Impaired [name] Coordination Gross Motor Movements are Fluid and Coordinated: No Fine Motor Movements are Fluid and Coordinated: No Finger Nose Finger Test: incoordinated   Motor  Motor Motor: Hemiplegia;Abnormal tone;Motor apraxia;Abnormal postural alignment and control Motor - Discharge Observations: improved by still present   Mobility Bed Mobility Bed Mobility: Sit to Supine;Supine to Sit Supine to Sit: 3: Mod assist Sit to Supine: 3: Mod  assist Transfers Transfers: Yes Sit to Stand: 3: Mod assist Sit to Stand Details: Manual facilitation for weight shifting;Tactile cues for initiation Sit to Stand Details (indicate cue type and reason): with bilateral UE support Stand to Sit: 4: Min assist Stand Pivot Transfers: 3: Mod assist Stand Pivot Transfer Details: Manual  facilitation for placement  Locomotion  Ambulation Ambulation: Yes Ambulation/Gait Assistance: 3: Mod assist Ambulation Distance (Feet): 25 Feet (x2) Assistive device: Hemi-walker Ambulation/Gait Assistance Details: Manual facilitation for weight bearing;Manual facilitation for weight shifting;Verbal cues for precautions/safety Ambulation/Gait Assistance Details: L toe off AFO Gait Gait: Yes Gait Pattern: Impaired Gait Pattern: Step-to pattern;Decreased step length - left;Decreased hip/knee flexion - left;Decreased dorsiflexion - left Stairs / Additional Locomotion Stairs: Yes Stairs Assistance: 3: Mod assist Stair Management Technique: One rail Right Number of Stairs: 3 Height of Stairs: 3 Wheelchair Mobility Wheelchair Mobility: Yes Wheelchair Assistance: 5: Careers information officer: Right upper extremity;Right lower extremity Wheelchair Parts Management: Needs assistance Distance: 150   Trunk/Postural Assessment  Cervical Assessment Cervical Assessment:  (forward flexion) Thoracic Assessment Thoracic Assessment:  (kyphoic) Lumbar Assessment Lumbar Assessment: Exceptions to WFL (left laterl lean in sitting and standing) Postural Control Postural Control: Deficits on evaluation Righting Reactions: delayed Protective Responses: delayed   Balance Balance Balance Assessed: Yes Static Sitting Balance Static Sitting - Balance Support: Feet supported;Right upper extremity supported Static Sitting - Level of Assistance: 5: Stand by assistance Dynamic Sitting Balance Dynamic Sitting - Balance Support: Right upper extremity  supported;Feet supported Dynamic Sitting - Level of Assistance: 4: Min assist;5: Stand by assistance Static Standing Balance Static Standing - Balance Support: During functional activity;No upper extremity supported Static Standing - Level of Assistance: 3: Mod assist Dynamic Standing Balance Dynamic Standing - Balance Support: No upper extremity supported;During functional activity Dynamic Standing - Level of Assistance: 3: Mod assist Dynamic Standing - Balance Activities: Kicking ball  Extremity Assessment  RUE Assessment RUE Assessment: Within Functional Limits [name] PROM (degrees) Overall PROM Left Upper Extremity: Within functional limits for tasks assessed [name] Strength [name] Overall Strength: Deficits [name] Tone [name] Tone: Mild;Modified Ashworth Modified Ashworth Scale for Grading Hypertonia [name]: Slight increase in muscle tone, manifested by a catch and release or by minimal resistance at the end of the range of motion when the affected part(s) is moved in flexion or extension [name] Tone Comments: Brunstrom VI however does have "alien like tendencies" and grasp reflex RLE Assessment RLE Assessment: Within Functional Limits LLE Assessment LLE Assessment: Exceptions to WFL LLE PROM (degrees) Overall PROM Left Lower Extremity: Within functional limits for tasks assessed LLE Strength LLE Overall Strength: Deficits LLE Overall Strength Comments: inconsistent muscle activation noted in L hip flexors and quad (2/5)  See FIM for current functional status  Chales Pelissier E Penven-Crew 02/09/2015, 3:57 PM

## 2015-02-09 NOTE — Progress Notes (Signed)
Nutrition Brief Note  RD consulted for food preferences. Family at bedside during time of visit. Patient with a good appetite. Meal completion most times 100%. Patient currently has [employer] ordered. RD to continue with current orders. Food choices on the heart healthy diet were discussed. Patient's food likes and dislikes noted. RD to modify dinner and breakfast options to patients likings. Plans for pt to be discharged tomorrow. No further nutrition interventions at this time. Please re-consult if needed.   Roslyn Smiling, MS, RD, LDN Pager # [phone] After hours/ weekend pager # [phone]

## 2015-02-09 NOTE — Progress Notes (Signed)
Occupational Therapy Discharge Summary  Patient Details  Name: Angela Keith MRN: [id number] Date of Birth: 27-Jun-1930     Patient has met 12 of 12 long term goals due to improved activity tolerance, improved balance, postural control, ability to compensate for deficits, functional use of  LEFT upper and LEFT lower extremity, improved attention, improved awareness and improved coordination.  Patient to discharge at [facility] Assist- mod A level depending on enivornment setup and fatigue.  Patient's care partner unable to provide level care pt requires 24/7 to maintain safety  to provide the necessary physical and cognitive assistance at discharge.    Reasons goals not met: n/a  Recommendation:  Patient will benefit from ongoing skilled OT services in skilled nursing facility setting to continue to advance functional skills in the area of BADL and Reduce care partner burden.  Equipment: No equipment provided  Reasons for discharge: treatment goals met and discharge from hospital  Patient/family agrees with progress made and goals achieved: Yes  OT Discharge Precautions/Restrictions  Precautions Precautions: Fall;Back Precaution Comments: daughter brought in lumbar corset from home for comfort, L inattention, pusher to the left Restrictions Weight Bearing Restrictions: No Pain  no c/o  ADL  see FIM Vision/Perception  Vision- History Baseline Vision/History: Wears glasses Wears Glasses: Reading only Patient Visual Report: No change from baseline Vision- Assessment Eye Alignment: Within Functional Limits Alignment/Gaze Preference: Gaze right Convergence: Within functional limits Visual Fields: No apparent deficits Praxis Praxis-Other Comments: improved at discharge  Cognition Arousal/Alertness: Awake/alert Orientation Level: Oriented to person;Oriented to place;Oriented to situation;Disoriented to time Attention: Focused;Sustained;Selective;Alternating Focused  Attention: Appears intact Sustained Attention: Appears intact Selective Attention: Appears intact Alternating Attention: Impaired Alternating Attention Impairment: Functional basic Memory: Impaired Memory Impairment: Retrieval deficit;Decreased recall of new information;Storage deficit Awareness: Impaired Awareness Impairment: Emergent impairment Problem Solving: Impaired Problem Solving Impairment: Functional complex Comments: safety impaired due to inattention to left and poor body positioning Sensation Sensation Light Touch: Impaired Detail Light Touch Impaired Details: Impaired LLE;Impaired [name] Stereognosis: Impaired Detail Stereognosis Impaired Details: Impaired [name] Hot/Cold: Appears Intact Proprioception: Impaired Detail Proprioception Impaired Details: Impaired LLE;Impaired [name] Coordination Gross Motor Movements are Fluid and Coordinated: No Fine Motor Movements are Fluid and Coordinated: No Finger Nose Finger Test: incoordinated  Motor  Motor Motor: Hemiplegia;Abnormal tone;Abnormal postural alignment and control Motor - Discharge Observations: improved by still present Mobility  Bed Mobility Supine to Sit: 3: Mod assist Sit to Supine: 3: Mod assist Transfers Sit to Stand: 4: Min assist Sit to Stand Details (indicate cue type and reason): with bilateral UE support Stand to Sit: 4: Min assist (with bilatearl UE support)  Trunk/Postural Assessment  Cervical Assessment Cervical Assessment:  (forward flexion) Thoracic Assessment Thoracic Assessment:  (kyphoic) Lumbar Assessment Lumbar Assessment: Exceptions to [facility] (left laterl lean in sitting and standing) Postural Control Postural Control: Deficits on evaluation Righting Reactions: delayed Protective Responses: delayed  Balance Static Sitting Balance Static Sitting - Level of Assistance: 5: Stand by assistance Dynamic Sitting Balance Dynamic Sitting - Level of Assistance: 4: Min assist;5: Stand by  assistance Static Standing Balance Static Standing - Level of Assistance: 4: Min assist;3: Mod assist Dynamic Standing Balance Dynamic Standing - Level of Assistance: 4: Min assist;3: Mod assist Extremity/Trunk Assessment RUE Assessment RUE Assessment: Within Functional Limits [name] PROM (degrees) Overall PROM Left Upper Extremity: Within functional limits for tasks assessed [name] Strength [name] Overall Strength: Deficits [name] Tone [name] Tone: Mild;Modified Ashworth Modified Ashworth Scale for Grading Hypertonia [name]: Slight increase in muscle tone,  manifested by a catch and release or by minimal resistance at the end of the range of motion when the affected part(s) is moved in flexion or extension [name] Tone Comments: Brunstrom VI however does have "alien like tendencies" and grasp reflex  See FIM for current functional status  Nicoletta Ba 02/09/2015, 2:39 PM

## 2015-02-09 NOTE — Progress Notes (Signed)
[place] PHYSICAL MEDICINE & REHABILITATION     PROGRESS NOTE    Subjective/Complaints: Denies nausea, still having occasional cough. Therapy went well yesterday   ROS: limited due to cognition, hearing, language   Objective: Vital Signs: Blood pressure 127/65, pulse 78, temperature 98 F (36.7 C), temperature source Oral, resp. rate 18, weight 68.9 kg (151 lb 14.4 oz), SpO2 96 %. Dg Chest 2 View  02/07/2015   CLINICAL DATA:  Chronic cough.  EXAM: CHEST  2 VIEW  COMPARISON:  January 18, 2015  FINDINGS: There is no edema or consolidation. Heart is upper normal in size with pulmonary vascularity within normal limits. There is a hiatal type hernia. No adenopathy. There is atherosclerotic change in the aorta. Bones are osteoporotic. There are calcified breast implants bilaterally.  IMPRESSION: Hiatal hernia present.  No edema or consolidation.   Electronically Signed   By: Bretta Bang III M.D.   On: 02/07/2015 17:14    Recent Labs  02/07/15 0506  WBC 5.5  HGB 11.7*  HCT 35.3*  PLT 298    Recent Labs  02/07/15 0506 02/08/15 0539  NA 128* 136  K 4.5 4.6  CL 96* 100*  GLUCOSE 103* 91  BUN 11 10  CREATININE 0.84 1.04*  CALCIUM 9.5 9.8   CBG (last 3)  No results for input(s): GLUCAP in the last 72 hours.  Wt Readings from Last 3 Encounters:  02/08/15 68.9 kg (151 lb 14.4 oz)  01/17/15 71.215 kg (157 lb)  01/11/15 71.385 kg (157 lb 6 oz)    Physical Exam:  Constitutional:  NAD Eyes: EOM are normal.  Mouth: dentition fair, oral mucosa moist Neck: Normal range of motion. Neck supple. No thyromegaly present.  Cardiovascular: Normal rate and regular rhythm.no murmurs or rubs  Respiratory: Effort normal and breath sounds normal. No respiratory distress. no wheezes. Occasional dry cough while i was in the room today GI: Soft. Bowel sounds are decreased. She exhibits no distension.  Neurological: She is alert and attentive.  HOH with improving insight and  awareness. [name] spastic (1+/4)  1+ to 2/5 strength proximal to distal--improving apraxia.Marland Kitchen LLE:  Tone 1+/4 today hamstrings and calf, heel cord tight still. MMT: 2 hf, 2+ke and 1 adf/apf. improving inattention to left.  DTR's 1+ right and 2+ left. No visible spasms or clonus Musc:  No ankle or foot swelling no pain with ROM, skin abrasion Left lat malleolus healing Skin: Skin is warm and dry.  Psychiatric: She has a normal mood and affect. Very alert and animated today   Assessment/Plan: 1. Functional deficits secondary to right ACA and left thalamic infarcts after recent lumbar decompression which require 3+ hours per day of interdisciplinary therapy in a comprehensive inpatient rehab setting. Physiatrist is providing close team supervision and 24 hour management of active medical problems listed below. Physiatrist and rehab team continue to assess barriers to discharge/monitor patient progress toward functional and medical goals. FIM: FIM - Bathing Bathing Steps Patient Completed: Chest, Right Arm, Left Arm, Abdomen, Front perineal area, Right upper leg, Left upper leg, Right lower leg (including foot), Left lower leg (including foot) (remained seated) Bathing: 4: Min-Patient completes 8-9 47f 10 parts or 75+ percent  FIM - Upper Body Dressing/Undressing Upper body dressing/undressing steps patient completed: Thread/unthread right sleeve of pullover shirt/dresss, Thread/unthread left sleeve of pullover shirt/dress, Put head through opening of pull over shirt/dress, Pull shirt over trunk Upper body dressing/undressing: 5: Set-up assist to: Obtain clothing/put away FIM - Lower Body Dressing/Undressing  Lower body dressing/undressing steps patient completed: Thread/unthread right underwear leg, Thread/unthread left underwear leg, Pull underwear up/down, Thread/unthread right pants leg, Thread/unthread left pants leg, Pull pants up/down, Don/Doff right shoe Lower body dressing/undressing: 3:  Mod-Patient completed 50-74% of tasks  FIM - Toileting Toileting steps completed by patient: Adjust clothing prior to toileting, Adjust clothing after toileting Toileting Assistive Devices: Grab bar or rail for support Toileting: 3: Mod-Patient completed 2 of 3 steps  FIM - Diplomatic Services operational officer Devices: Elevated toilet seat Toilet Transfers: 3-To toilet/BSC: Mod A (lift or lower assist), 3-From toilet/BSC: Mod A (lift or lower assist)  FIM - Banker Devices: Arm rests Bed/Chair Transfer: 3: Supine > Sit: Mod A (lifting assist/Pt. 50-74%/lift 2 legs, 2: Bed > Chair or W/C: Max A (lift and lower assist)  FIM - Locomotion: Wheelchair Distance: 150 Locomotion: Wheelchair: 5: Travels 150 ft or more: maneuvers on rugs and over door sills with supervision, cueing or coaxing FIM - Locomotion: Ambulation Locomotion: Ambulation Assistive Devices: Walker - Hemi (L ace wrap DF assist) Ambulation/Gait Assistance: 3: Mod assist Locomotion: Ambulation: 1: Travels less than 50 ft with moderate assistance (Pt: 50 - 74%)  Comprehension Comprehension Mode: Auditory Comprehension: 5-Follows basic conversation/direction: With extra time/assistive device  Expression Expression Mode: Verbal Expression: 5-Expresses basic needs/ideas: With extra time/assistive device  Social Interaction Social Interaction: 6-Interacts appropriately with others with medication or extra time (anti-anxiety, antidepressant).  Problem Solving Problem Solving: 5-Solves basic 90% of the time/requires cueing < 10% of the time  Memory Memory Mode: Not assessed Memory: 5-Recognizes or recalls 90% of the time/requires cueing < 10% of the time  Medical Problem List and Plan: 1. Functional deficits secondary to status post lumbar decompression 01/17/2015 with postoperative right ACA, left thalamic infarcts.Has left hemiparesis Status post loop recorder -continue  therapies---slow progress but she is quite determined  -probably will need placement  2. DVT Prophylaxis/Anticoagulation: continue sq heparin VTE risk LLE  -no bleeding sequelae, platelets normal  -dopplers negative 3. Pain Management:    -continued tone in LLE  -continue flexeril 5mg  q8 prn  -continue gabapentin 300mg  at 2100 for sleep, restless leg sx  - tramadol 25-50mg  q6 prn  -dc'ed baclofen d/t nausea   4. Hypertension. Lopressor 25 mg twice a day. bp control is improved 5. Neuropsych: This patient is potentially capable of making decisions on her own behalf. 6. Skin/Wound Care: Routine skin checks and local care to surgical site. Encourage adequate nutrition 7. Fluids/Electrolytes/Nutrition: continue potassium supplement  -labs reviewed in full by me  -hyponatremia: 136 yesterday 8. Hyperlipidemia. Pravachol 9. Neurogenic bladder:  Urine with  100k e coli s/p amoxil  -retention-foley   -continue low dose flomax also--HS 10. Sleep---scheduled trazodone 25mg  qhs  -continue sleep chart to firmly document sleep pattern-sleeping better 11. Nausea: med induced? Seems better after dc of baclofen 12. Wheezing/cough: chest xray personally reviewed by me and clear  -auscultation clear also  -prn robitussin   -added tessalon prn for cough  -dc albuterol  -may be allergy related---add claritin 10mg  daily   LOS (Days) 20 A FACE TO FACE EVALUATION WAS PERFORMED  Colandra Ohanian T 02/09/2015 10:12 AM

## 2015-02-09 NOTE — Progress Notes (Signed)
Physical Therapy Session Note  Patient Details  Name: Angela Keith MRN: [id number] Date of Birth: [birth year]  Today's Date: 02/09/2015 PT Individual Time:  -  [phone] Treatment Time: 60 min     Short Term Goals: Week 3:  PT Short Term Goal 1 (Week 3): STGs = LTGs due to ELOS  Skilled Therapeutic Interventions/Progress Updates:    Therapeutic Activity: PT received on toilet - unable to poop. PT instructs pt in toilet transfer from [facility] over toilet stand-pivot to w/c to R with use of grab bar req max A - pt req assist to pull underwear and pants up and tot A for posterior hygiene.   Neuromuscular Reeducation: PT instructs pt in TUG x 3 attempts with HW req mod A and pt scores: 131 sec, 123 sec, and 120 seconds - using R HW and L Toe Off AFO; AVG: 125 sec PT instructs pt in dynamic stand balance activity: reaching R and placing horseshoes on top of mirror req mod A - PT progresses this to reaching with L arm to remove horseshoes from mirror req facilitation at L arm and mod A - pt unable to release horseshoe from hand and drop it on the ground.   Gait Training: PT instructs pt in ascending/descending 1 (3" height) step with B handrails x 2 reps req max A - PT blocks pt's L knee in stance and assists L foot in placement up/down. PT progresses this to 2 steps but PT under pt's L arm this time req mod A, then x 3 steps - all with R handrail and mod A.   Pt became tearful near the end of today's session, feeling overwhelmed. PT offered empathy and support. Pt tolerated a standardized outcome measure (TUG) for the first time, today, and is continuing to progress, albeit slowly. Continue per PT POC.   Therapy Documentation Precautions:  Precautions Precautions: Fall, Back Precaution Comments: daughter brought in lumbar corset from home for comfort, L inattention, pusher to the left Restrictions Weight Bearing Restrictions: No Pain: Pain Assessment Pain Assessment: No/denies  pain  Balance: Balance Balance Assessed: Yes Standardized Balance Assessment Standardized Balance Assessment: Timed Up and Go Test Timed Up and Go Test TUG: Normal TUG (mod A with R HW and L AFO) Normal TUG (seconds): 125  See FIM for current functional status  Therapy/Group: Individual Therapy  Estefani Bateson M 02/09/2015, 8:38 AM

## 2015-02-09 NOTE — Progress Notes (Signed)
Social Work Patient ID: Karleen Dolphin, female   DOB: 11/04/1929, 81 y.o.   MRN: [id number]   CSW met with pt and pt's dtr-in-law, Mrs. Beatrice Lecher, to update them on team conference discussion and to talk about d/c plan.  Pt is accepting of SNF transfer and Mrs. Drema Dallas and her husband (pt's son) have toured [facility] and wish for pt to go there.  CSW is working with [facility] to make arrangements for pt's transfer.  Mrs. Drema Dallas also had multiple medical questions, so CSW asked the PA, Linna Hoff Angiulli to talk with family to answer these.  This has happened since my request and family feels comfortable with the information given.  CSW also attempted to reach out to the nutrition services staff re: meals and await their return call.  [employer] representative will come today to complete paperwork with pt and Mrs. Barnes.  CSW will continue to assist with pt's transfer to SNF.

## 2015-02-09 NOTE — Progress Notes (Signed)
Occupational Therapy Session Note  Patient Details  Name: [name] MRN: [id number] Date of Birth: [birth year]  Today's Date: 02/09/2015 OT Individual Time: 4098-1191 OT Individual Time Calculation (min): 45 min    Short Term Goals: Week 3:  OT Short Term Goal 1 (Week 3): Pt will demo ability to complete lower body bathing, supine with mod assist. OT Short Term Goal 2 (Week 3): Pt will demo ability to pull up pants over right hip while seated using lateral leans OT Short Term Goal 3 (Week 3): Pt will wash upper body seated at sink with min assist OT Short Term Goal 4 (Week 3): Pt will groom, 3 of 5 tasks, with setup and supervision  Skilled Therapeutic Interventions/Progress Updates:    1:1 self care retraining at shower level. Pt able to transfer from bed to w.c with max A with stand pivot to her left. Transition into the bathroom to take a shower. Once transferred on to the tub bench using the grab bar with bilateral hands; focused on sitting balance with maintaining posture at midline. Used metal shower rod as a visual reminder to maintain midline. Pt able to bathe UB with mod cuing and LB with long handled sponge with steadying A for balance. Pt able to wash front peri area in sitting but bottom hygiene is performed outside of the shower in standing at the sink to maintain safety.  Pt dressed at sink. Pt able to orient shirt from folded position with mod cuing for pulling it down over trunk on left side. Pt able to thread underwear and pants with left Le positioned over right with steady A. Pt able to come into standing with min A  With bilateral hands on sink. Once standing focus on progressing balance from needing both hands to using both hands to pull up pants; requiring min to mod A to maintain balance with facilitation / support at left knee and hip for extension. Pt able to release objects in left hand today with cue to place left hand in her lap or handing the object to her right hand.  Pt setup up for breakfast and able to finish eating once setup.  Therapy Documentation Precautions:  Precautions Precautions: Fall, Back Precaution Comments: daughter brought in lumbar corset from home for comfort, L inattention, pusher to the left Restrictions Weight Bearing Restrictions: No General:   Vital Signs: Therapy Vitals Temp: 98 F (36.7 C) Temp Source: Oral Pulse Rate: 78 Resp: 18 BP: 127/65 mmHg Patient Position (if appropriate): Lying Oxygen Therapy SpO2: 96 % O2 Device: Not Delivered Pain: Pain Assessment Pain Assessment: No/denies pain  See FIM for current functional status  Therapy/Group: Individual Therapy  Roney Mans [facility] 02/09/2015, 9:25 AM

## 2015-02-10 DIAGNOSIS — R262 Difficulty in walking, not elsewhere classified: Secondary | ICD-10-CM | POA: Diagnosis not present

## 2015-02-10 DIAGNOSIS — D649 Anemia, unspecified: Secondary | ICD-10-CM | POA: Diagnosis not present

## 2015-02-10 DIAGNOSIS — I69391 Dysphagia following cerebral infarction: Secondary | ICD-10-CM | POA: Diagnosis not present

## 2015-02-10 DIAGNOSIS — R278 Other lack of coordination: Secondary | ICD-10-CM | POA: Diagnosis not present

## 2015-02-10 DIAGNOSIS — R339 Retention of urine, unspecified: Secondary | ICD-10-CM | POA: Diagnosis not present

## 2015-02-10 DIAGNOSIS — M1990 Unspecified osteoarthritis, unspecified site: Secondary | ICD-10-CM | POA: Diagnosis not present

## 2015-02-10 DIAGNOSIS — I639 Cerebral infarction, unspecified: Secondary | ICD-10-CM | POA: Diagnosis not present

## 2015-02-10 DIAGNOSIS — N289 Disorder of kidney and ureter, unspecified: Secondary | ICD-10-CM | POA: Diagnosis not present

## 2015-02-10 DIAGNOSIS — R5381 Other malaise: Secondary | ICD-10-CM | POA: Diagnosis not present

## 2015-02-10 DIAGNOSIS — G8194 Hemiplegia, unspecified affecting left nondominant side: Secondary | ICD-10-CM | POA: Diagnosis not present

## 2015-02-10 DIAGNOSIS — N319 Neuromuscular dysfunction of bladder, unspecified: Secondary | ICD-10-CM | POA: Diagnosis not present

## 2015-02-10 DIAGNOSIS — G629 Polyneuropathy, unspecified: Secondary | ICD-10-CM | POA: Diagnosis not present

## 2015-02-10 DIAGNOSIS — M4806 Spinal stenosis, lumbar region: Secondary | ICD-10-CM | POA: Diagnosis not present

## 2015-02-10 DIAGNOSIS — M5410 Radiculopathy, site unspecified: Secondary | ICD-10-CM | POA: Diagnosis not present

## 2015-02-10 DIAGNOSIS — F329 Major depressive disorder, single episode, unspecified: Secondary | ICD-10-CM | POA: Diagnosis not present

## 2015-02-10 DIAGNOSIS — F05 Delirium due to known physiological condition: Secondary | ICD-10-CM | POA: Diagnosis not present

## 2015-02-10 DIAGNOSIS — G4700 Insomnia, unspecified: Secondary | ICD-10-CM | POA: Diagnosis not present

## 2015-02-10 DIAGNOSIS — H4011X2 Primary open-angle glaucoma, moderate stage: Secondary | ICD-10-CM | POA: Diagnosis not present

## 2015-02-10 DIAGNOSIS — F4321 Adjustment disorder with depressed mood: Secondary | ICD-10-CM | POA: Diagnosis not present

## 2015-02-10 DIAGNOSIS — G8190 Hemiplegia, unspecified affecting unspecified side: Secondary | ICD-10-CM | POA: Diagnosis not present

## 2015-02-10 DIAGNOSIS — R41841 Cognitive communication deficit: Secondary | ICD-10-CM | POA: Diagnosis not present

## 2015-02-10 DIAGNOSIS — I6310 Cerebral infarction due to embolism of unspecified precerebral artery: Secondary | ICD-10-CM | POA: Diagnosis not present

## 2015-02-10 DIAGNOSIS — E785 Hyperlipidemia, unspecified: Secondary | ICD-10-CM | POA: Diagnosis not present

## 2015-02-10 DIAGNOSIS — M62838 Other muscle spasm: Secondary | ICD-10-CM | POA: Diagnosis not present

## 2015-02-10 DIAGNOSIS — E039 Hypothyroidism, unspecified: Secondary | ICD-10-CM | POA: Diagnosis not present

## 2015-02-10 DIAGNOSIS — I6340 Cerebral infarction due to embolism of unspecified cerebral artery: Secondary | ICD-10-CM | POA: Diagnosis not present

## 2015-02-10 DIAGNOSIS — I10 Essential (primary) hypertension: Secondary | ICD-10-CM | POA: Diagnosis not present

## 2015-02-10 DIAGNOSIS — M452 Ankylosing spondylitis of cervical region: Secondary | ICD-10-CM | POA: Diagnosis not present

## 2015-02-10 DIAGNOSIS — R630 Anorexia: Secondary | ICD-10-CM | POA: Diagnosis not present

## 2015-02-10 DIAGNOSIS — K5900 Constipation, unspecified: Secondary | ICD-10-CM | POA: Diagnosis not present

## 2015-02-10 DIAGNOSIS — I6330 Cerebral infarction due to thrombosis of unspecified cerebral artery: Secondary | ICD-10-CM | POA: Diagnosis not present

## 2015-02-10 DIAGNOSIS — R05 Cough: Secondary | ICD-10-CM | POA: Diagnosis not present

## 2015-02-10 DIAGNOSIS — E46 Unspecified protein-calorie malnutrition: Secondary | ICD-10-CM | POA: Diagnosis not present

## 2015-02-10 DIAGNOSIS — M6281 Muscle weakness (generalized): Secondary | ICD-10-CM | POA: Diagnosis not present

## 2015-02-10 NOTE — Progress Notes (Signed)
[place] PHYSICAL MEDICINE & REHABILITATION     PROGRESS NOTE    Subjective/Complaints: Left leg tight at times. Wants to keep stretching it ("even if i yell!")   ROS: limited due to cognition, hearing, language   Objective: Vital Signs: Blood pressure 114/56, pulse 86, temperature 97.6 F (36.4 C), temperature source Oral, resp. rate 16, weight 68.765 kg (151 lb 9.6 oz), SpO2 94 %. No results found. No results for input(s): WBC, HGB, HCT, PLT in the last 72 hours.  Recent Labs  02/08/15 0539  NA 136  K 4.6  CL 100*  GLUCOSE 91  BUN 10  CREATININE 1.04*  CALCIUM 9.8   CBG (last 3)  No results for input(s): GLUCAP in the last 72 hours.  Wt Readings from Last 3 Encounters:  02/10/15 68.765 kg (151 lb 9.6 oz)  01/17/15 71.215 kg (157 lb)  01/11/15 71.385 kg (157 lb 6 oz)    Physical Exam:  Constitutional:  NAD Eyes: EOM are normal.  Mouth: dentition fair, oral mucosa moist Neck: Normal range of motion. Neck supple. No thyromegaly present.  Cardiovascular: Normal rate and regular rhythm.no murmurs or rubs  Respiratory: Effort normal and breath sounds normal. No respiratory distress. no wheezes. Occasional dry cough while i was in the room today GI: Soft. Bowel sounds are decreased. She exhibits no distension.  Neurological: She is alert and attentive.  HOH with improving insight and awareness. [name] spastic (1+/4)  1+ to 2/5 strength proximal to distal--improving apraxia.Marland Kitchen LLE:  Tone 1+ to 2/4 today hamstrings and calf, . MMT: 2 hf, 2+ke and 1 adf/apf. improving inattention to left.  DTR's 1+ right and 3+ left. No visible spasms or clonus Musc:  No ankle or foot swelling no pain with ROM, skin abrasion Left lat malleolus healing Skin: Skin is warm and dry.  Psychiatric: She has a normal mood and affect. Very alert and animated today   Assessment/Plan: 1. Functional deficits secondary to right ACA and left thalamic infarcts after recent lumbar decompression  which require 3+ hours per day of interdisciplinary therapy in a comprehensive inpatient rehab setting. Physiatrist is providing close team supervision and 24 hour management of active medical problems listed below. Physiatrist and rehab team continue to assess barriers to discharge/monitor patient progress toward functional and medical goals. FIM: FIM - Bathing Bathing Steps Patient Completed: Chest, Right Arm, Left Arm, Abdomen, Front perineal area, Right upper leg, Left upper leg, Right lower leg (including foot), Left lower leg (including foot) (remained seated) Bathing: 4: Min-Patient completes 8-9 62f 10 parts or 75+ percent  FIM - Upper Body Dressing/Undressing Upper body dressing/undressing steps patient completed: Thread/unthread right sleeve of pullover shirt/dresss, Thread/unthread left sleeve of pullover shirt/dress, Put head through opening of pull over shirt/dress, Pull shirt over trunk Upper body dressing/undressing: 5: Set-up assist to: Obtain clothing/put away FIM - Lower Body Dressing/Undressing Lower body dressing/undressing steps patient completed: Thread/unthread right underwear leg, Thread/unthread left underwear leg, Pull underwear up/down, Thread/unthread right pants leg, Thread/unthread left pants leg, Pull pants up/down, Don/Doff right shoe Lower body dressing/undressing: 3: Mod-Patient completed 50-74% of tasks  FIM - Toileting Toileting steps completed by patient: Adjust clothing prior to toileting, Adjust clothing after toileting Toileting Assistive Devices: Grab bar or rail for support Toileting: 3: Mod-Patient completed 2 of 3 steps  FIM - Diplomatic Services operational officer Devices: Elevated toilet seat Toilet Transfers: 3-To toilet/BSC: Mod A (lift or lower assist), 3-From toilet/BSC: Mod A (lift or lower assist)  FIM -  Bed/Chair Transport planner Devices: Arm rests, Orthosis (L AFO or L ace-wrap DF assist) Bed/Chair Transfer: 3:  Bed > Chair or W/C: Mod A (lift or lower assist), 3: Chair or W/C > Bed: Mod A (lift or lower assist)  FIM - Locomotion: Wheelchair Distance: 150 Locomotion: Wheelchair: 5: Travels 150 ft or more: maneuvers on rugs and over door sills with supervision, cueing or coaxing FIM - Locomotion: Ambulation Locomotion: Ambulation Assistive Devices: Walker - Hemi, Orthosis (L AFO) Ambulation/Gait Assistance: 3: Mod assist Locomotion: Ambulation: 1: Travels less than 50 ft with moderate assistance (Pt: 50 - 74%)  Comprehension Comprehension Mode: Auditory Comprehension: 5-Follows basic conversation/direction: With extra time/assistive device  Expression Expression Mode: Verbal Expression: 5-Expresses basic needs/ideas: With extra time/assistive device  Social Interaction Social Interaction: 6-Interacts appropriately with others with medication or extra time (anti-anxiety, antidepressant).  Problem Solving Problem Solving: 5-Solves basic 90% of the time/requires cueing < 10% of the time  Memory Memory Mode: Not assessed Memory: 5-Recognizes or recalls 90% of the time/requires cueing < 10% of the time  Medical Problem List and Plan: 1. Functional deficits secondary to status post lumbar decompression 01/17/2015 with postoperative right ACA, left thalamic infarcts.Has left hemiparesis Status post loop recorder -continue therapies---slow progress but she is quite determined  - placement today  -follow up with me in 4-6 weeks  2. DVT Prophylaxis/Anticoagulation: continue sq heparin VTE risk LLE  -no bleeding sequelae, platelets normal  -dopplers negative 3. Pain Management:    -continued tone in LLE  -continue flexeril 5mg  q8 prn  -continue gabapentin 300mg  at 2100 for sleep, restless leg sx  -tramadol 25-50mg  q6 prn  -dc'ed baclofen d/t nausea---will need to work on ROM/positioning---not a good candidate for oral antispasmodics given age/intolerance   4. Hypertension. Lopressor 25 mg  twice a day. bp control is improved 5. Neuropsych: This patient is potentially capable of making decisions on her own behalf. 6. Skin/Wound Care: Routine skin checks and local care to surgical site. Encourage adequate nutrition 7. Fluids/Electrolytes/Nutrition: continue potassium supplement  -labs reviewed in full by me  -hyponatremia: 136 on last labs 8. Hyperlipidemia. Pravachol 9. Neurogenic bladder:  Urine with  100k e coli s/p amoxil  -retention-foley   -continue low dose flomax also--HS 10. Sleep---scheduled trazodone 25mg  qhs  -continue sleep chart to firmly document sleep pattern-sleeping better 11. Nausea: med induced? Seems better after dc of baclofen 12. Wheezing/cough: chest xray personally reviewed by me and clear  -auscultation clear also  -prn robitussin   -added tessalon prn for cough  -dc albuterol  -may be allergy related---add claritin 10mg  daily   LOS (Days) 21 A FACE TO FACE EVALUATION WAS PERFORMED  SWARTZ,ZACHARY T 02/10/2015 9:21 AM

## 2015-02-10 NOTE — Progress Notes (Signed)
Social Work Discharge Note  The overall goal for the admission was met for:   Discharge location:  No - plan changed to SNF  Length of Stay: Yes - 20 days  Discharge activity level: No - moderate assistance  Home/community participation: No  Services provided included: MD, RD, PT, OT, SLP, RN, Pharmacy and SW  Financial Services: Medicare and Private Insurance: T[employer]are  Follow-up services arranged: Other: Pt discharged to [facility] for SNF care.  Comments (or additional information):  Pt discharged to [facility] via [facility] to continue her rehab.  Copied chart accompanied pt.  Family aware of d/c plan and assisted CSW with making arrangements.  Pt was very appreciative of care on Rehab and plans to "walk back in" to see [place].  CSW offered support and encouragement. Dtr-in-law here at time of d/c.  Patient/Family verbalized understanding of follow-up arrangements: Yes  Individual responsible for coordination of the follow-up plan: pt and her dtr-in-law/son  Confirmed correct DME delivered: Trey Sailors 02/10/2015    Taher Vannote, Silvestre Mesi

## 2015-02-10 NOTE — Progress Notes (Signed)
Speech Language Pathology Discharge Summary  Patient Details  Name: Angela Keith MRN: [id number] Date of Birth: 1930/06/20   Patient has met 5 of 5 long term goals.  Patient to discharge at overall Supervision;Min level.   Reasons goals not met: N/A   Clinical Impression/Discharge Summary: Patient has made excellent gains and has met 5 of 5 LTG's this admission due to improved cognitive function. Currently, patient requires supervision-Min A verbal cues for orientation, alternating attention, attention to left field of environment, recall of new information, functional problem solving, emergent awareness and overall safety. Patient requires 24 hour supervision at discharge, however, patient's family is unable to provide the necessary physical and cognitive assistance needed at this time, therefore, patient will discharge to a SNF. Patient would benefit from f/u SLP services to maximize cognitive function and overall functional independence in order to reduce caregiver burden.   Care Partner:  Caregiver Able to Provide Assistance: No  Type of Caregiver Assistance: Physical;Cognitive  Recommendation:  24 hour supervision/assistance;Skilled Nursing facility  Rationale for SLP Follow Up: Maximize cognitive function and independence;Reduce caregiver burden   Equipment: N/A   Reasons for discharge: Treatment goals met;Discharged from hospital   Patient/Family Agrees with Progress Made and Goals Achieved: Yes   See FIM for current functional status  Arnesia Vincelette 02/10/2015, 7:14 AM

## 2015-02-14 ENCOUNTER — Non-Acute Institutional Stay (SKILLED_NURSING_FACILITY): Payer: Medicare Other | Admitting: Internal Medicine

## 2015-02-14 DIAGNOSIS — M48062 Spinal stenosis, lumbar region with neurogenic claudication: Secondary | ICD-10-CM

## 2015-02-14 DIAGNOSIS — R5381 Other malaise: Secondary | ICD-10-CM | POA: Diagnosis not present

## 2015-02-14 DIAGNOSIS — E039 Hypothyroidism, unspecified: Secondary | ICD-10-CM

## 2015-02-14 DIAGNOSIS — E46 Unspecified protein-calorie malnutrition: Secondary | ICD-10-CM

## 2015-02-14 DIAGNOSIS — I6330 Cerebral infarction due to thrombosis of unspecified cerebral artery: Secondary | ICD-10-CM | POA: Diagnosis not present

## 2015-02-14 DIAGNOSIS — D649 Anemia, unspecified: Secondary | ICD-10-CM

## 2015-02-14 DIAGNOSIS — I10 Essential (primary) hypertension: Secondary | ICD-10-CM

## 2015-02-14 DIAGNOSIS — N289 Disorder of kidney and ureter, unspecified: Secondary | ICD-10-CM

## 2015-02-14 DIAGNOSIS — G4700 Insomnia, unspecified: Secondary | ICD-10-CM

## 2015-02-14 DIAGNOSIS — K5900 Constipation, unspecified: Secondary | ICD-10-CM

## 2015-02-14 DIAGNOSIS — M4806 Spinal stenosis, lumbar region: Secondary | ICD-10-CM | POA: Diagnosis not present

## 2015-02-14 DIAGNOSIS — R339 Retention of urine, unspecified: Secondary | ICD-10-CM | POA: Diagnosis not present

## 2015-02-14 DIAGNOSIS — L89520 Pressure ulcer of left ankle, unstageable: Secondary | ICD-10-CM

## 2015-02-14 DIAGNOSIS — F4321 Adjustment disorder with depressed mood: Secondary | ICD-10-CM

## 2015-02-14 NOTE — Progress Notes (Signed)
Patient ID: Angela Keith, female   DOB: 05-25-1930, 81 y.o.   MRN: [id number]     Facility: [facility]    PCP: [name], MD  Code Status: full code  Allergies  Allergen Reactions  . Ace Inhibitors Other (See Comments)    Severe fatigue  . Lipitor [Atorvastatin] Other (See Comments)    Mood swings  . Motrin [Ibuprofen] Nausea And Vomiting    Chief Complaint  Patient presents with  . New Admit To SNF     HPI:  81 y.o. patient is here for short term rehabilitation post hospital admission and then inpatient rehab admission for functional deficit in setting of lumbar stenosis with radiculopathy. She underwent lumbar decompression L2-4 with laminectomy on January 17 2015. Post surgery, she was discharged home and was readmitted with left sided weakness. MRI/MRA of the brain showed right distal ACA segment occlusion with acute posterior right ACA infarct as well as punctate acute infarct medial left thalamus and no mass affect. neurology and cardiology were consulted. The patient did not receive tPA. TEE attempt was unsuccessful. She had loop recorder placement on January 19, 2015 and was placed on Plavix. She was then in inpatient rehab centre and worked with PT, OT and SLP team. She completed treatment for E.coli UTI there. She was having recurrent urinary retention and had foley placed with low dose flomax. Her pain medication and muscle relaxant were adjusted in hospital. She is here for continuation of rehabilitation. She is seen in her room today with her daughter in law at bedside. She is hard of hearing and has a hearing aid. Her pain is not under control with current pain regimen. She has been constipated.   Review of Systems:  Constitutional: Positive for malaise. Negative for fever, chills, diaphoresis.  HENT: Negative for headache, congestion, nasal discharge.   Eyes: Negative for eye pain, blurred vision, double vision and discharge.    Respiratory: Negative for cough, shortness of breath and wheezing.   Cardiovascular: Negative for chest pain, palpitations, leg swelling.  Gastrointestinal: Negative for heartburn, nausea, vomiting, abdominal pain. Genitourinary: has foley in place Musculoskeletal: Negative for falls in facility Skin: Negative for itching, rash.  Neurological: Negative for dizziness, tingling, focal weakness Psychiatric/Behavioral: positive for depression   Past Medical History  Diagnosis Date  . Hypothyroidism   . Arthritis   . GERD (gastroesophageal reflux disease)   . Dysphagia   . Hyperlipidemia   . Right bundle branch block 03/21/2014  . HTN (hypertension) 10/30/2012  . Unspecified hypothyroidism 10/30/2012  . Severe hearing loss   . DJD (degenerative joint disease), lumbar   . Rotoscoliosis   . Glaucoma   . DJD (degenerative joint disease), cervical   . Chronic neck pain   . Nocturia   . Insomnia   . Stroke   . Shortness of breath dyspnea    Past Surgical History  Procedure Laterality Date  . Breast surgery    . Back surgery      lower back   . Esophagogastroduodenoscopy (egd) with esophageal dilation  06/08/2012    Procedure: ESOPHAGOGASTRODUODENOSCOPY (EGD) WITH ESOPHAGEAL DILATION;  Surgeon: Charolett Bumpers, MD;  Location: [facility] ENDOSCOPY;  Service: Endoscopy;  Laterality: N/A;  . Bladder laser surgery    . Left carpal tunnel surgery    . Facail surgery  2000    facelift and eyelid lift  . Cataract surgery    . Lumbar laminectomy/decompression microdiscectomy N/A 01/17/2015    Procedure: Laminectomy  and Foraminotomy - L2-L3 - L3-L4;  Surgeon: [name], MD;  Location: [facility] NEURO ORS;  Service: Neurosurgery;  Laterality: N/A;  Laminectomy and Foraminotomy - L2-L3 - L3-L4  . Ep implantable device N/A 01/19/2015    Procedure: Loop Recorder Insertion;  Surgeon: Marinus Maw, MD;  Location: [facility] INVASIVE CV LAB;  Service: Cardiovascular;  Laterality: N/A;  . Tee without cardioversion N/A  01/19/2015    Procedure: TRANSESOPHAGEAL ECHOCARDIOGRAM (TEE);  Surgeon: Chrystie Nose, MD;  Location: [facility] ENDOSCOPY;  Service: Cardiovascular;  Laterality: N/A;   Social History:   reports that she has never smoked. She does not have any smokeless tobacco history on file. She reports that she does not drink alcohol or use illicit drugs.  Family History  Problem Relation Age of Onset  . CAD Father   . Stroke Father   . CAD Brother   . Diabetes Mother   . Lung cancer Other     Medications:   Medication List       This list is accurate as of: 02/14/15  2:11 PM.  Always use your most recent med list.               clopidogrel 75 MG tablet  Commonly known as:  PLAVIX  Take 1 tablet (75 mg total) by mouth daily.     cyclobenzaprine 5 MG tablet  Commonly known as:  FLEXERIL  Take 1 tablet (5 mg total) by mouth 3 (three) times daily as needed for muscle spasms.     dorzolamide-timolol 22.3-6.8 MG/ML ophthalmic solution  Commonly known as:  COSOPT  Place 1 drop into both eyes 2 (two) times daily.     gabapentin 300 MG capsule  Commonly known as:  NEURONTIN  Take 300 mg by mouth at bedtime.     guaifenesin 100 MG/5ML syrup  Commonly known as:  ROBITUSSIN  Take 200 mg by mouth 4 (four) times daily as needed for cough.     latanoprost 0.005 % ophthalmic solution  Commonly known as:  XALATAN  Place 1 drop into both eyes at bedtime.     levothyroxine 88 MCG tablet  Commonly known as:  SYNTHROID, LEVOTHROID  Take 88 mcg by mouth daily before breakfast.     loratadine 10 MG tablet  Commonly known as:  CLARITIN  Take 10 mg by mouth daily.     metoprolol tartrate 25 MG tablet  Commonly known as:  LOPRESSOR  Take 25 mg by mouth 2 (two) times daily.     pantoprazole 40 MG tablet  Commonly known as:  PROTONIX  Take 40 mg by mouth 2 (two) times daily.     pravastatin 40 MG tablet  Commonly known as:  PRAVACHOL  Take 1 tablet (40 mg total) by mouth daily at 6 PM.      sodium chloride 0.65 % Soln nasal spray  Commonly known as:  OCEAN  Place 1 spray into both nostrils as needed for congestion.     tamsulosin 0.4 MG Caps capsule  Commonly known as:  FLOMAX  Take 0.4 mg by mouth daily.     traMADol 50 MG tablet  Commonly known as:  ULTRAM  Take 0.5-1 tablets (25-50 mg total) by mouth every 6 (six) hours as needed for moderate pain or severe pain.     traZODone 50 MG tablet  Commonly known as:  DESYREL  Take 0.5 tablets (25 mg total) by mouth at bedtime.  Physical Exam: Filed Vitals:   02/14/15 1401  BP: 126/79  Pulse: 86  Temp: 97.8 F (36.6 C)  Resp: 18  SpO2: 95%    General- elderly female, frail, in no acute distress Head- normocephalic, atraumatic Ears- hearing aid present Nose- normal nasal mucosa, no maxillary or frontal sinus tenderness, no nasal discharge Throat- moist mucus membrane Eyes- PERRLA, EOMI, no pallor, no icterus, no discharge, normal conjunctiva, normal sclera Neck- no cervical lymphadenopathy Cardiovascular- normal s1,s2, no murmurs, palpable dorsalis pedis and radial pulses, trace leg edema Respiratory- bilateral clear to auscultation, no wheeze, no rhonchi, no crackles, no use of accessory muscles Abdomen- bowel sounds present, soft, non tender, foley in place draining urine Musculoskeletal- able to move all 4 extremities, left sided lower extremity weakness more prominent, left leg some spasticity noted, right leg has ted hose, left lateral malleolus ulcer and una boot present Neurological- no focal deficit, alert and oriented to person, place and time Skin- warm and dry, left lateral malleolus unstageable pressure ulcer, lumbar incision with steristrip and honeycomb dressing, lower back surgical incision with honeycomb dressing with steristrips in place Psychiatry- tearful and sad   Labs reviewed: Basic Metabolic Panel:  Recent Labs  16/10/96 1837  01/31/15 0519 02/07/15 0506 02/08/15 0539  NA   --   < > 134* 128* 136  K  --   < > 4.5 4.5 4.6  CL  --   < > 100* 96* 100*  CO2  --   < > 25 24 30   GLUCOSE  --   < > 106* 103* 91  BUN  --   < > 13 11 10   CREATININE  --   < > 1.08* 0.84 1.04*  CALCIUM  --   < > 9.5 9.5 9.8  MG 2.0  --  2.0  --   --   < > = values in this interval not displayed. Liver Function Tests:  Recent Labs  01/18/15 0341 01/23/15 0529 01/31/15 0519  AST 27 24 26   ALT 17 17 23   ALKPHOS 47 46 56  BILITOT 0.7 0.7 0.4  PROT 6.7 6.5 6.3*  ALBUMIN 3.8 2.7* 3.0*   No results for input(s): LIPASE, AMYLASE in the last 8760 hours. No results for input(s): AMMONIA in the last 8760 hours. CBC:  Recent Labs  01/11/15 1333 01/18/15 0341  01/23/15 0529 01/31/15 0519 02/07/15 0506  WBC 6.5 10.9*  < > 8.1 9.0 5.5  NEUTROABS 3.3 7.9*  --  5.8  --   --   HGB 13.0 11.5*  < > 11.7* 12.1 11.7*  HCT 40.4 34.8*  < > 35.1* 36.8 35.3*  MCV 87.4 85.3  < > 84.8 87.4 87.8  PLT 261 224  < > 291 373 298  < > = values in this interval not displayed. Cardiac Enzymes: No results for input(s): CKTOTAL, CKMB, CKMBINDEX, TROPONINI in the last 8760 hours. BNP: Invalid input(s): POCBNP CBG:  Recent Labs  09/05/14 1151  GLUCAP 105*     Assessment/Plan  Physical deconditioning Will have her work with physical therapy and occupational therapy team to help with gait training and muscle strengthening exercises.fall precautions. Skin care. Encourage to be out of bed.   Acute CVA Continue plavix 75 mg daily, pravastatin 40 mg daily . To work with therapy team to restore strength and function. Continue flexeril 5 mg q8h prn for muscle spasm  Lumbar stenosis with radiculopathy S/p decompression. Surgical site looks good. Continue tramadol 25 mg but increase  the frequency to 1-2 tab q4h prn pain and reassess. Continue neurontin 300 mg daily  Urinary retention Make f/u with urology in 1-2 week. Continue foley with catheter care and flomax for now  Constipation Add  miralax 17 g daily with senna s 2 tab daily and reassess  Anemia Check cbc  Protein calorie malnutrition Check prealbumin, dietary consult, encourage po intake, monitor weight and pressure ulcer prophylaxis to be provided  Impaired renal function Check bmp  Depression Likely situational. Start citalopram 10 mg daily for a week and increase to 20 mg daily. Psychology consult for situational depression  unstageable left lateral malleolus pressure ulcer Air mattress, clean with NS and apply santyl, collagen and allevyn daily.   Hypothyroidism Continue synthroid 88 mcg daily and check tsh  HTN Stable bp, continue lopressor 25 mg bid  Insomnia Continue trazodone 25 mg qhs and monitor  Goals of care: short term rehabilitation    Labs/tests ordered: cbc, cmp, tsh, prealbumin  Family/ staff Communication: reviewed care plan with patient and nursing supervisor    Oneal Grout, MD  [facility] [phone] (Monday-Friday 8 am - 5 pm) [phone] (afterhours)

## 2015-02-15 ENCOUNTER — Ambulatory Visit: Payer: Medicare Other | Admitting: Neurology

## 2015-02-16 ENCOUNTER — Other Ambulatory Visit: Payer: Self-pay | Admitting: *Deleted

## 2015-02-16 MED ORDER — TRAMADOL HCL 50 MG PO TABS: ORAL_TABLET | ORAL | Status: DC

## 2015-02-16 NOTE — Telephone Encounter (Signed)
[facility] 

## 2015-02-17 ENCOUNTER — Ambulatory Visit (INDEPENDENT_AMBULATORY_CARE_PROVIDER_SITE_OTHER): Payer: Medicare Other

## 2015-02-17 ENCOUNTER — Encounter: Payer: Self-pay | Admitting: Internal Medicine

## 2015-02-17 DIAGNOSIS — I6330 Cerebral infarction due to thrombosis of unspecified cerebral artery: Secondary | ICD-10-CM | POA: Diagnosis not present

## 2015-02-21 NOTE — Progress Notes (Signed)
Loop recorder 

## 2015-02-22 ENCOUNTER — Telehealth: Payer: Self-pay | Admitting: *Deleted

## 2015-02-22 NOTE — Telephone Encounter (Signed)
Attempted to call patient to request manual transmission.  No answer, will attempt again later.

## 2015-02-23 ENCOUNTER — Encounter: Payer: Self-pay | Admitting: Internal Medicine

## 2015-02-23 NOTE — Telephone Encounter (Signed)
Spoke with Roxy Horseman who sent manual transmission for patient's LINQ device.  Reviewed available episodes with Dr. Graciela Husbands (DOD), who recommended that patient send full transmission for further clarification of ? atrial fibrillation.  Will discuss full transmission results with Dr. Ladona Ridgel when available.

## 2015-02-27 ENCOUNTER — Non-Acute Institutional Stay (SKILLED_NURSING_FACILITY): Payer: Medicare Other | Admitting: Nurse Practitioner

## 2015-02-27 ENCOUNTER — Other Ambulatory Visit: Payer: Self-pay | Admitting: *Deleted

## 2015-02-27 DIAGNOSIS — R059 Cough, unspecified: Secondary | ICD-10-CM

## 2015-02-27 DIAGNOSIS — M5410 Radiculopathy, site unspecified: Secondary | ICD-10-CM | POA: Diagnosis not present

## 2015-02-27 DIAGNOSIS — R05 Cough: Secondary | ICD-10-CM | POA: Diagnosis not present

## 2015-02-27 MED ORDER — TRAMADOL HCL 50 MG PO TABS: ORAL_TABLET | ORAL | Status: DC

## 2015-02-27 NOTE — Progress Notes (Signed)
Patient ID: Angela Keith, female   DOB: March 02, 1930, 82 y.o.   MRN: [id number]    Nursing Home Location:  [facility]   Place of Service: SNF (31)  PCP: [name], MD  Allergies  Allergen Reactions  . Ace Inhibitors Other (See Comments)    Severe fatigue  . Lipitor [Atorvastatin] Other (See Comments)    Mood swings  . Motrin [Ibuprofen] Nausea And Vomiting    Chief Complaint  Patient presents with  . Acute Visit    pain issues    HPI:  Patient is a 81 y.o. female seen today at [facility] due to acute pain. Pt s/p decompression and laminectomy. Family concerned that staff not routinely giving pain medication pt not asking for medication and therefore pain getting out of control. Over the weekend tramadol was scheduled with additional PRN dose if needed. Pt reports this has helped the pain at times but there is a grabbing her leg that persist. Also with pain going down leg. pts daughter in law in room during visit.  Also reports persistent cough that has been going on since prior to hospitalization. Work up was negative and she was placed on benzonatate with good results.   Review of Systems:  Review of Systems  Constitutional: Negative for activity change, appetite change and fatigue.  HENT: Positive for hearing loss. Negative for congestion.   Eyes: Negative.   Respiratory: Positive for cough. Negative for shortness of breath and wheezing.   Cardiovascular: Negative for chest pain, palpitations and leg swelling.  Gastrointestinal: Negative for abdominal pain, diarrhea and constipation.  Genitourinary: Negative for dysuria and difficulty urinating.  Musculoskeletal: Positive for myalgias, arthralgias and gait problem.  Skin: Negative for color change and wound.  Neurological: Positive for weakness. Negative for dizziness.    Past Medical History  Diagnosis Date  . Hypothyroidism   . Arthritis   . GERD (gastroesophageal  reflux disease)   . Dysphagia   . Hyperlipidemia   . Right bundle branch block 03/21/2014  . HTN (hypertension) 10/30/2012  . Unspecified hypothyroidism 10/30/2012  . Severe hearing loss   . DJD (degenerative joint disease), lumbar   . Rotoscoliosis   . Glaucoma   . DJD (degenerative joint disease), cervical   . Chronic neck pain   . Nocturia   . Insomnia   . Stroke   . Shortness of breath dyspnea    Past Surgical History  Procedure Laterality Date  . Breast surgery    . Back surgery      lower back   . Esophagogastroduodenoscopy (egd) with esophageal dilation  06/08/2012    Procedure: ESOPHAGOGASTRODUODENOSCOPY (EGD) WITH ESOPHAGEAL DILATION;  Surgeon: Charolett Bumpers, MD;  Location: [facility] ENDOSCOPY;  Service: Endoscopy;  Laterality: N/A;  . Bladder laser surgery    . Left carpal tunnel surgery    . Facail surgery  2000    facelift and eyelid lift  . Cataract surgery    . Lumbar laminectomy/decompression microdiscectomy N/A 01/17/2015    Procedure: Laminectomy and Foraminotomy - L2-L3 - L3-L4;  Surgeon: [name], MD;  Location: [facility] NEURO ORS;  Service: Neurosurgery;  Laterality: N/A;  Laminectomy and Foraminotomy - L2-L3 - L3-L4  . Ep implantable device N/A 01/19/2015    Procedure: Loop Recorder Insertion;  Surgeon: Marinus Maw, MD;  Location: [facility] INVASIVE CV LAB;  Service: Cardiovascular;  Laterality: N/A;  . Tee without cardioversion N/A 01/19/2015    Procedure: TRANSESOPHAGEAL ECHOCARDIOGRAM (TEE);  Surgeon: Chrystie Nose, MD;  Location: [facility] ENDOSCOPY;  Service: Cardiovascular;  Laterality: N/A;   Social History:   reports that she has never smoked. She does not have any smokeless tobacco history on file. She reports that she does not drink alcohol or use illicit drugs.  Family History  Problem Relation Age of Onset  . CAD Father   . Stroke Father   . CAD Brother   . Diabetes Mother   . Lung cancer Other     Medications: Patient's Medications  New Prescriptions   No  medications on file  Previous Medications   BACLOFEN (LIORESAL) 10 MG TABLET    Take 5 mg by mouth 3 (three) times daily.   CLOPIDOGREL (PLAVIX) 75 MG TABLET    Take 1 tablet (75 mg total) by mouth daily.   DORZOLAMIDE-TIMOLOL (COSOPT) 22.3-6.8 MG/ML OPHTHALMIC SOLUTION    Place 1 drop into both eyes 2 (two) times daily.   GABAPENTIN (NEURONTIN) 300 MG CAPSULE    Take 300 mg by mouth at bedtime.   GUAIFENESIN (ROBITUSSIN) 100 MG/5ML SYRUP    Take 200 mg by mouth 4 (four) times daily as needed for cough.   LATANOPROST (XALATAN) 0.005 % OPHTHALMIC SOLUTION    Place 1 drop into both eyes at bedtime.   LEVOTHYROXINE (SYNTHROID, LEVOTHROID) 88 MCG TABLET    Take 88 mcg by mouth daily before breakfast.   LORATADINE (CLARITIN) 10 MG TABLET    Take 10 mg by mouth daily.   METOPROLOL TARTRATE (LOPRESSOR) 25 MG TABLET    Take 25 mg by mouth 2 (two) times daily.   PANTOPRAZOLE (PROTONIX) 40 MG TABLET    Take 40 mg by mouth 2 (two) times daily.   PRAVASTATIN (PRAVACHOL) 40 MG TABLET    Take 1 tablet (40 mg total) by mouth daily at 6 PM.   SODIUM CHLORIDE (OCEAN) 0.65 % SOLN NASAL SPRAY    Place 1 spray into both nostrils as needed for congestion.   TAMSULOSIN (FLOMAX) 0.4 MG CAPS CAPSULE    Take 0.4 mg by mouth daily.   TRAMADOL (ULTRAM) 50 MG TABLET    Take 1/2 tablet by mouth every eight hours for pain; Take 1/2 tablets by mouth every four hours as needed for pain   TRAZODONE (DESYREL) 50 MG TABLET    Take 0.5 tablets (25 mg total) by mouth at bedtime.  Modified Medications   No medications on file  Discontinued Medications   CYCLOBENZAPRINE (FLEXERIL) 5 MG TABLET    Take 1 tablet (5 mg total) by mouth 3 (three) times daily as needed for muscle spasms.     Physical Exam: Filed Vitals:   02/27/15 1455  BP: 128/79  Pulse: 76  Temp: 97.1 F (36.2 C)  Resp: 20    Physical Exam  Constitutional: No distress.  Frail elderly female  HENT:  Head: Normocephalic and atraumatic.  Mouth/Throat:  Oropharynx is clear and moist. No oropharyngeal exudate.  Eyes: Conjunctivae are normal. Pupils are equal, round, and reactive to light.  Neck: Normal range of motion. Neck supple.  Cardiovascular: Normal rate, regular rhythm and normal heart sounds.   Pulmonary/Chest: Effort normal and breath sounds normal. No respiratory distress. She has no wheezes.  Abdominal: Soft. Bowel sounds are normal.  Musculoskeletal: She exhibits tenderness (to left upper leg, thigh with limited ROM). She exhibits no edema.  Neurological: She is alert.  Skin: Skin is warm and dry. She is not diaphoretic.  Psychiatric: She has a normal mood  and affect.    Labs reviewed: Basic Metabolic Panel:  Recent Labs  16/10/96 1837  01/31/15 0519 02/07/15 0506 02/08/15 0539  NA  --   < > 134* 128* 136  K  --   < > 4.5 4.5 4.6  CL  --   < > 100* 96* 100*  CO2  --   < > 25 24 30   GLUCOSE  --   < > 106* 103* 91  BUN  --   < > 13 11 10   CREATININE  --   < > 1.08* 0.84 1.04*  CALCIUM  --   < > 9.5 9.5 9.8  MG 2.0  --  2.0  --   --   < > = values in this interval not displayed. Liver Function Tests:  Recent Labs  01/18/15 0341 01/23/15 0529 01/31/15 0519  AST 27 24 26   ALT 17 17 23   ALKPHOS 47 46 56  BILITOT 0.7 0.7 0.4  PROT 6.7 6.5 6.3*  ALBUMIN 3.8 2.7* 3.0*   No results for input(s): LIPASE, AMYLASE in the last 8760 hours. No results for input(s): AMMONIA in the last 8760 hours. CBC:  Recent Labs  01/11/15 1333 01/18/15 0341  01/23/15 0529 01/31/15 0519 02/07/15 0506  WBC 6.5 10.9*  < > 8.1 9.0 5.5  NEUTROABS 3.3 7.9*  --  5.8  --   --   HGB 13.0 11.5*  < > 11.7* 12.1 11.7*  HCT 40.4 34.8*  < > 35.1* 36.8 35.3*  MCV 87.4 85.3  < > 84.8 87.4 87.8  PLT 261 224  < > 291 373 298  < > = values in this interval not displayed. TSH:  Recent Labs  02/08/15 0539  TSH 0.876   A1C: Lab Results  Component Value Date   HGBA1C 6.1* 01/19/2015   Lipid Panel:  Recent Labs  09/06/14 0224  01/19/15 0452  CHOL 200 122  HDL 53 52  LDLCALC 137* 66  TRIG 51 20  CHOLHDL 3.8 2.3      Assessment/Plan 1. Back pain with left-sided radiculopathy Tramadol 25 mg scheduled q 8 hours with additional 25 mg every 4 hours as needed. Seems to be effective at this time. However reports tightness in leg when moved. Has spasticity noted after CVA, currently on baclofen 5 mg TID, will increase to 10 mg TID to see if this helps overall with pain management    2. Cough Worked up in hospital which was negative, daughter reports this happens seasonally.  -was taking benzonatate with good result -will start benzonatate 100 mg q 4 hours PRN cough Also to cont Claritin 10 mg daily   Aidah Forquer K. Biagio Borg  [facility] [phone] 8 am - 5 pm) [phone] (after hours)

## 2015-02-27 NOTE — Telephone Encounter (Signed)
[facility] 

## 2015-02-28 ENCOUNTER — Telehealth: Payer: Self-pay | Admitting: *Deleted

## 2015-02-28 ENCOUNTER — Non-Acute Institutional Stay (SKILLED_NURSING_FACILITY): Payer: Medicare Other | Admitting: Internal Medicine

## 2015-02-28 DIAGNOSIS — M4806 Spinal stenosis, lumbar region: Secondary | ICD-10-CM | POA: Diagnosis not present

## 2015-02-28 DIAGNOSIS — M48062 Spinal stenosis, lumbar region with neurogenic claudication: Secondary | ICD-10-CM

## 2015-02-28 LAB — CUP PACEART REMOTE DEVICE CHECK: Date Time Interrogation Session: 20160815040500

## 2015-02-28 NOTE — Progress Notes (Addendum)
Patient ID: Angela Keith, female   DOB: 1929/10/17, 81 y.o.   MRN: [id number]    Facility: [facility]   Chief Complaint  Patient presents with  . Acute Visit    uncontrolled pain   Allergies  Allergen Reactions  . Ace Inhibitors Other (See Comments)    Severe fatigue  . Lipitor [Atorvastatin] Other (See Comments)    Mood swings  . Motrin [Ibuprofen] Nausea And Vomiting   HPI 81 y/o female pt seen in her room today for uncontrolled leg pain. She is currently on tramadol 25 mg tid with 25 mg q4h prn pain. She was also started on baclofen 10 mg tid in place of 5 mg tid yesterday. She was not able to sleep at night with leg pain and grades her pain as 10/10 at present. She is hard of hearing and her son is present at bedside.  ROS Positive for ongoing numbness in her legs Denies nausea or vomiting Denies headache Denies chest pain or dyspnea  Past Medical History  Diagnosis Date  . Hypothyroidism   . Arthritis   . GERD (gastroesophageal reflux disease)   . Dysphagia   . Hyperlipidemia   . Right bundle branch block 03/21/2014  . HTN (hypertension) 10/30/2012  . Unspecified hypothyroidism 10/30/2012  . Severe hearing loss   . DJD (degenerative joint disease), lumbar   . Rotoscoliosis   . Glaucoma   . DJD (degenerative joint disease), cervical   . Chronic neck pain   . Nocturia   . Insomnia   . Stroke   . Shortness of breath dyspnea    Medication reviewed. See [facility]  Physical exam BP 154/96 mmHg  Pulse 60  Temp(Src) 97.4 F (36.3 C)  Resp 18  SpO2 95%  General- elderly female in no acute distress Head- atraumatic, normocephalic Neck- no lymphadenopathy Cardiovascular- normal s1,s2, no murmurs Respiratory- bilateral clear to auscultation, no wheeze, no rhonchi, no crackles Abdomen- bowel sounds present, soft, non tender, foley in place Musculoskeletal- able to move all 4 extremities, left leg ROM limited with pain  Neurological- no  focal deficit Psychiatry- alert and oriented to person, place and time, normal mood and affect   Assessment/plan  Lumbar spinal stenosis with radiculopathy S/p decompression and laminectomy. Increase tramadol to 50 mg qid for now and continue 25 mg q4h prn pain. Monitor clinically to avoid oversedation from the pain medication. Continue to work with therapy team. Continue baclofen 10 mg tid for muscle spasm and gabapentin for neuropathic pain. rveiewed care plan with patient, her son and nursing staff  Hypertension Currently on lopressor 25 mg bid. Her pain likely contributing to the pain. Check bo q shift for now and reassess  Oneal Grout, MD  [facility] [phone] (Monday-Friday 8 am - 5 pm) [phone] (afterhours)

## 2015-02-28 NOTE — Telephone Encounter (Signed)
Spoke with Angela Keith regarding AF burden and need to start anticoagulation.  Per Angela Keith, the patient is at [facility] for rehab.  She gave me fax number to fax prescription to (relayed from charge nurse in the background).  Spoke with Dr. Ladona Ridgel, who wrote prescription for Eliquis.  Prescription faxed to [facility].  3 month follow-up appointment with Dr. Ladona Ridgel scheduled for 06/06/2015 at 11:30am.  Patient's daughter-in-law appreciative of call and denies any additional questions or concerns at this time.

## 2015-02-28 NOTE — Telephone Encounter (Signed)
Follow    How long has she been in A-FIB?

## 2015-02-28 NOTE — Telephone Encounter (Signed)
Spoke with Roxy Horseman regarding atrial fibrillation seen on patient's LINQ transmission.  Reviewed with Dr. Ladona Ridgel, who recommends that patient see the [facility] Clinic to start anticoagulation protocol.  Per Ms. Zachery Dauer, patient was started on Eliquis but she is not sure which provider started it.  Eliquis is not on patient's current medication list.  Ms. Zachery Dauer to call me back with more information.

## 2015-03-02 MED ORDER — APIXABAN 5 MG PO TABS: 5.0000 mg | ORAL_TABLET | Freq: Two times a day (BID) | ORAL | Status: DC

## 2015-03-02 NOTE — Addendum Note (Signed)
Addended by: Nigel Sloop on: 03/02/2015 06:51 PM   Modules accepted: Orders, Medications

## 2015-03-06 ENCOUNTER — Telehealth: Payer: Self-pay | Admitting: *Deleted

## 2015-03-06 ENCOUNTER — Telehealth: Payer: Self-pay | Admitting: Nurse Practitioner

## 2015-03-06 NOTE — Telephone Encounter (Signed)
Spoke with Annie Main, NP with neurology.  AF found on LINQ.  Will stop Plavix and start OAC.    Gypsy Balsam, NP 03/06/2015 10:06 AM

## 2015-03-06 NOTE — Telephone Encounter (Signed)
Called Angela Keith to make her aware that per Gypsy Balsam, NP Angela Keith's Plavix should be stopped.  Angela Keith is appreciative of call and denies any additional questions or concerns.  Faxed order to [facility]

## 2015-03-07 ENCOUNTER — Telehealth: Payer: Self-pay | Admitting: Internal Medicine

## 2015-03-07 NOTE — Telephone Encounter (Signed)
New message     For Irving Burton Please refax order to stop plavix to [facility].   Fax number is [phone].  They never received it.

## 2015-03-09 ENCOUNTER — Encounter: Payer: Self-pay | Admitting: Internal Medicine

## 2015-03-14 DIAGNOSIS — H4011X2 Primary open-angle glaucoma, moderate stage: Secondary | ICD-10-CM | POA: Diagnosis not present

## 2015-03-15 ENCOUNTER — Other Ambulatory Visit: Payer: Self-pay

## 2015-03-15 ENCOUNTER — Non-Acute Institutional Stay (SKILLED_NURSING_FACILITY): Payer: Medicare Other | Admitting: Nurse Practitioner

## 2015-03-15 DIAGNOSIS — I6330 Cerebral infarction due to thrombosis of unspecified cerebral artery: Secondary | ICD-10-CM

## 2015-03-15 DIAGNOSIS — R339 Retention of urine, unspecified: Secondary | ICD-10-CM | POA: Diagnosis not present

## 2015-03-15 DIAGNOSIS — E039 Hypothyroidism, unspecified: Secondary | ICD-10-CM

## 2015-03-15 DIAGNOSIS — I10 Essential (primary) hypertension: Secondary | ICD-10-CM | POA: Diagnosis not present

## 2015-03-15 DIAGNOSIS — E785 Hyperlipidemia, unspecified: Secondary | ICD-10-CM

## 2015-03-15 DIAGNOSIS — F05 Delirium due to known physiological condition: Secondary | ICD-10-CM

## 2015-03-15 DIAGNOSIS — M5410 Radiculopathy, site unspecified: Secondary | ICD-10-CM

## 2015-03-15 MED ORDER — TRAMADOL HCL 50 MG PO TABS: 50.0000 mg | ORAL_TABLET | Freq: Three times a day (TID) | ORAL | Status: DC

## 2015-03-15 MED ORDER — MELATONIN 3 MG PO TABS: 3.0000 mg | ORAL_TABLET | Freq: Every day | ORAL | Status: DC

## 2015-03-15 MED ORDER — BENZONATATE 100 MG PO CAPS: 100.0000 mg | ORAL_CAPSULE | Freq: Three times a day (TID) | ORAL | Status: DC | PRN

## 2015-03-15 MED ORDER — POLYETHYLENE GLYCOL 3350 17 GM/SCOOP PO POWD: 17.0000 g | Freq: Every day | ORAL | Status: DC

## 2015-03-15 MED ORDER — SENNOSIDES-DOCUSATE SODIUM 8.6-50 MG PO TABS: 2.0000 | ORAL_TABLET | Freq: Every day | ORAL | Status: DC

## 2015-03-15 NOTE — Telephone Encounter (Signed)
Rx faxed to [facility] @ [phone], phone number [phone]  

## 2015-03-15 NOTE — Progress Notes (Signed)
Patient ID: Angela Keith, female   DOB: 10/05/1929, 81 y.o.   MRN: [id number]    Nursing Home Location:  [facility]   Place of Service: SNF (31)  PCP: [name], MD  Allergies  Allergen Reactions  . Ace Inhibitors Other (See Comments)    Severe fatigue  . Lipitor [Atorvastatin] Other (See Comments)    Mood swings  . Motrin [Ibuprofen] Nausea And Vomiting    Chief Complaint  Patient presents with  . Medical Management of Chronic Issues    HPI:  Patient is a 81 y.o. female seen today at [facility] for medical management of her chronic medical issues.  [name] has a past medical history of CVA, hypothyroidism, hyperlipidemia, and cervical DJD.  She is seen today in her room and reports that she is feeling well, but is still complaining of left leg pain.  Her tramadol was decreased yesterday due to confusion. Urine was sent for UA C&S and lab work was ordered yesterday, has not been obtained at this time. Ultram was decreased.  She is alert and oriented today. She is still able to work with PT and complete her exercises.    Review of Systems:  Review of Systems  Constitutional: Negative for fever, chills and fatigue.  HENT: Negative for congestion and sore throat.   Eyes: Negative.   Respiratory: Negative for cough, shortness of breath and wheezing.   Cardiovascular: Negative for chest pain, palpitations and leg swelling.  Gastrointestinal: Negative for nausea, diarrhea and constipation.  Genitourinary: Negative.   Musculoskeletal: Positive for myalgias. Negative for back pain and joint swelling.  Skin: Negative for color change, pallor, rash and wound.  Neurological: Negative for dizziness, speech difficulty, numbness and headaches.    Past Medical History  Diagnosis Date  . Hypothyroidism   . Arthritis   . GERD (gastroesophageal reflux disease)   . Dysphagia   . Hyperlipidemia   . Right bundle branch block 03/21/2014  .  HTN (hypertension) 10/30/2012  . Unspecified hypothyroidism 10/30/2012  . Severe hearing loss   . DJD (degenerative joint disease), lumbar   . Rotoscoliosis   . Glaucoma   . DJD (degenerative joint disease), cervical   . Chronic neck pain   . Nocturia   . Insomnia   . Stroke   . Shortness of breath dyspnea    Past Surgical History  Procedure Laterality Date  . Breast surgery    . Back surgery      lower back   . Esophagogastroduodenoscopy (egd) with esophageal dilation  06/08/2012    Procedure: ESOPHAGOGASTRODUODENOSCOPY (EGD) WITH ESOPHAGEAL DILATION;  Surgeon: Charolett Bumpers, MD;  Location: [facility] ENDOSCOPY;  Service: Endoscopy;  Laterality: N/A;  . Bladder laser surgery    . Left carpal tunnel surgery    . Facail surgery  2000    facelift and eyelid lift  . Cataract surgery    . Lumbar laminectomy/decompression microdiscectomy N/A 01/17/2015    Procedure: Laminectomy and Foraminotomy - L2-L3 - L3-L4;  Surgeon: [name], MD;  Location: [facility] NEURO ORS;  Service: Neurosurgery;  Laterality: N/A;  Laminectomy and Foraminotomy - L2-L3 - L3-L4  . Ep implantable device N/A 01/19/2015    Procedure: Loop Recorder Insertion;  Surgeon: Marinus Maw, MD;  Location: [facility] INVASIVE CV LAB;  Service: Cardiovascular;  Laterality: N/A;  . Tee without cardioversion N/A 01/19/2015    Procedure: TRANSESOPHAGEAL ECHOCARDIOGRAM (TEE);  Surgeon: Chrystie Nose, MD;  Location: [facility]  ENDOSCOPY;  Service: Cardiovascular;  Laterality: N/A;   Social History:   reports that she has never smoked. She does not have any smokeless tobacco history on file. She reports that she does not drink alcohol or use illicit drugs.  Family History  Problem Relation Age of Onset  . CAD Father   . Stroke Father   . CAD Brother   . Diabetes Mother   . Lung cancer Other     Medications: Patient's Medications  New Prescriptions   BENZONATATE (TESSALON) 100 MG CAPSULE    Take 1 capsule (100 mg total) by mouth 3 (three) times  daily as needed for cough.   MELATONIN 3 MG TABS    Take 1 tablet (3 mg total) by mouth at bedtime.   POLYETHYLENE GLYCOL POWDER (GLYCOLAX/MIRALAX) POWDER    Take 17 g by mouth daily.   SENNA-DOCUSATE (SENOKOT-S) 8.6-50 MG PER TABLET    Take 2 tablets by mouth daily.  Previous Medications   APIXABAN (ELIQUIS) 5 MG TABS TABLET    Take 1 tablet (5 mg total) by mouth 2 (two) times daily.   BACLOFEN (LIORESAL) 10 MG TABLET    Take 10 mg by mouth 4 (four) times daily.    DORZOLAMIDE-TIMOLOL (COSOPT) 22.3-6.8 MG/ML OPHTHALMIC SOLUTION    Place 1 drop into both eyes 2 (two) times daily.   GABAPENTIN (NEURONTIN) 300 MG CAPSULE    Take 200 mg by mouth 3 (three) times daily.    LATANOPROST (XALATAN) 0.005 % OPHTHALMIC SOLUTION    Place 1 drop into both eyes at bedtime.   LEVOTHYROXINE (SYNTHROID, LEVOTHROID) 88 MCG TABLET    Take 88 mcg by mouth daily before breakfast.   LORATADINE (CLARITIN) 10 MG TABLET    Take 10 mg by mouth daily.   METOPROLOL TARTRATE (LOPRESSOR) 25 MG TABLET    Take 25 mg by mouth 2 (two) times daily.   PANTOPRAZOLE (PROTONIX) 40 MG TABLET    Take 40 mg by mouth 2 (two) times daily.   PRAVASTATIN (PRAVACHOL) 40 MG TABLET    Take 1 tablet (40 mg total) by mouth daily at 6 PM.   SODIUM CHLORIDE (OCEAN) 0.65 % SOLN NASAL SPRAY    Place 1 spray into both nostrils as needed for congestion.   TAMSULOSIN (FLOMAX) 0.4 MG CAPS CAPSULE    Take 0.4 mg by mouth daily.   TRAZODONE (DESYREL) 50 MG TABLET    Take 0.5 tablets (25 mg total) by mouth at bedtime.  Modified Medications   Modified Medication Previous Medication   TRAMADOL (ULTRAM) 50 MG TABLET traMADol (ULTRAM) 50 MG tablet      Take 1 tablet (50 mg total) by mouth 3 (three) times daily.    Take 1/2 tablet by mouth every eight hours for pain; Take 1/2 tablets by mouth every four hours as needed for pain  Discontinued Medications   GUAIFENESIN (ROBITUSSIN) 100 MG/5ML SYRUP    Take 200 mg by mouth 4 (four) times daily as needed for  cough.     Physical Exam: Filed Vitals:   03/15/15 0939  BP: 137/76  Pulse: 82  Temp: 97.7 F (36.5 C)  TempSrc: Oral  Resp: 19  Weight: 147 lb 1.6 oz (66.724 kg)  SpO2: 97%    Physical Exam  Constitutional: She is oriented to person, place, and time. She appears well-developed. No distress.  HENT:  Head: Normocephalic and atraumatic.  Mouth/Throat: Oropharynx is clear and moist. No oropharyngeal exudate.  Eyes: Pupils are equal, round,  and reactive to light.  Neck: Normal range of motion. Neck supple.  Cardiovascular: Normal rate, regular rhythm and normal heart sounds.   Pulmonary/Chest: Effort normal and breath sounds normal. She has no wheezes.  Abdominal: Soft. Bowel sounds are normal.  Genitourinary:  Has indwelling urinary catheter.  Musculoskeletal:  ROM decreased in left leg  Lymphadenopathy:    She has no cervical adenopathy.  Neurological: She is alert and oriented to person, place, and time.  Skin: Skin is warm and dry. She is not diaphoretic.  Psychiatric: She has a normal mood and affect.    Labs reviewed: Basic Metabolic Panel:  Recent Labs  16/10/96 1837  01/31/15 0519 02/07/15 0506 02/08/15 0539  NA  --   < > 134* 128* 136  K  --   < > 4.5 4.5 4.6  CL  --   < > 100* 96* 100*  CO2  --   < > 25 24 30   GLUCOSE  --   < > 106* 103* 91  BUN  --   < > 13 11 10   CREATININE  --   < > 1.08* 0.84 1.04*  CALCIUM  --   < > 9.5 9.5 9.8  MG 2.0  --  2.0  --   --   < > = values in this interval not displayed. Liver Function Tests:  Recent Labs  01/18/15 0341 01/23/15 0529 01/31/15 0519  AST 27 24 26   ALT 17 17 23   ALKPHOS 47 46 56  BILITOT 0.7 0.7 0.4  PROT 6.7 6.5 6.3*  ALBUMIN 3.8 2.7* 3.0*   No results for input(s): LIPASE, AMYLASE in the last 8760 hours. No results for input(s): AMMONIA in the last 8760 hours. CBC:  Recent Labs  01/11/15 1333 01/18/15 0341  01/23/15 0529 01/31/15 0519 02/07/15 0506  WBC 6.5 10.9*  < > 8.1 9.0 5.5    NEUTROABS 3.3 7.9*  --  5.8  --   --   HGB 13.0 11.5*  < > 11.7* 12.1 11.7*  HCT 40.4 34.8*  < > 35.1* 36.8 35.3*  MCV 87.4 85.3  < > 84.8 87.4 87.8  PLT 261 224  < > 291 373 298  < > = values in this interval not displayed. TSH:  Recent Labs  02/08/15 0539  TSH 0.876   A1C: Lab Results  Component Value Date   HGBA1C 6.1* 01/19/2015   Lipid Panel:  Recent Labs  09/06/14 0224 01/19/15 0452  CHOL 200 122  HDL 53 52  LDLCALC 137* 66  TRIG 51 20  CHOLHDL 3.8 2.3      Assessment/Plan 1. Essential hypertension Stable.  Blood pressures remain in desirable range.  Continue metoprolol.    2. Hypothyroidism, unspecified hypothyroidism type Stable.  TSH was checked in August.  Continue Synthroid at current dose.   3. Cerebral infarction due to thrombosis of cerebral artery Stable.  Continues to work with PT.  Patient is alert and oriented x3. Patient taking Eliquis.  No signs of bleeding.  Continue same.   4. Back pain with left-sided radiculopathy Continues to complain of cramping pain in left leg. Tramadol was decreased yesterday due to patient confusion, pain does not interfere with PT. Patient says it is her inner left thigh that hurts the most but is relieved with massage.  Will add lidoderm patch.    5. HLD (hyperlipidemia) Stable.  Continue pravachol.  Lipid panel up to date.    6. Acute confusion Has improved at  this time, lab work pending   7. Urinary retention -with chronic foley from hospital, placed on Flomax.  -will send to urology for further evaluation at this time.    Janene Harvey. Biagio Borg  [facility] [phone] 8 am - 5 pm) [phone] (after hours)

## 2015-03-20 ENCOUNTER — Encounter: Payer: Self-pay | Admitting: Internal Medicine

## 2015-03-20 ENCOUNTER — Ambulatory Visit (INDEPENDENT_AMBULATORY_CARE_PROVIDER_SITE_OTHER): Payer: Medicare Other | Admitting: *Deleted

## 2015-03-20 DIAGNOSIS — I6330 Cerebral infarction due to thrombosis of unspecified cerebral artery: Secondary | ICD-10-CM | POA: Diagnosis not present

## 2015-03-22 ENCOUNTER — Ambulatory Visit (INDEPENDENT_AMBULATORY_CARE_PROVIDER_SITE_OTHER): Payer: Medicare Other | Admitting: Neurology

## 2015-03-22 ENCOUNTER — Encounter: Payer: Self-pay | Admitting: Neurology

## 2015-03-22 VITALS — BP 127/57 | HR 69 | Ht 64.0 in | Wt 149.0 lb

## 2015-03-22 DIAGNOSIS — I6340 Cerebral infarction due to embolism of unspecified cerebral artery: Secondary | ICD-10-CM | POA: Diagnosis not present

## 2015-03-22 DIAGNOSIS — I639 Cerebral infarction, unspecified: Secondary | ICD-10-CM

## 2015-03-22 MED ORDER — BACLOFEN 10 MG PO TABS: 10.0000 mg | ORAL_TABLET | Freq: Three times a day (TID) | ORAL | Status: DC

## 2015-03-22 NOTE — Patient Instructions (Signed)
I had a long d/w patient, daughter in law and her physical therapist about her recent stroke, risk for recurrent stroke/TIAs, personally independently reviewed imaging studies and stroke evaluation results and answered questions.Continue Eliquis for secondary stroke prevention  given recent diagnosis of atrial fibrillationand maintain strict control of hypertension with blood pressure goal below 130/90, diabetes with hemoglobin A1c goal below 6.5% and lipids with LDL cholesterol goal below 100 mg/dL. I also advised the patient to eat a healthy diet with plenty of whole grains, cereals, fruits and vegetables, exercise regularly and maintain ideal body weight. Increase dose of baclofen to 20 mg at night to help with nocturnal spasms on spasticity. Continue Neurontin and the current dose of 300 mg 3 times daily but try to limit and reduce use of Ultram. Continue ongoing physical and occupational therapy and encourage ambulation. Followup in the future with me in  3 months or call earlier if necessary. Stroke Prevention Some medical conditions and behaviors are associated with an increased chance of having a stroke. You may prevent a stroke by making healthy choices and managing medical conditions. HOW CAN I REDUCE MY RISK OF HAVING A STROKE?   Stay physically active. Get at least 30 minutes of activity on most or all days.  Do not smoke. It may also be helpful to avoid exposure to secondhand smoke.  Limit alcohol use. Moderate alcohol use is considered to be:  No more than 2 drinks per day for men.  No more than 1 drink per day for nonpregnant women.  Eat healthy foods. This involves:  Eating 5 or more servings of fruits and vegetables a day.  Making dietary changes that address high blood pressure (hypertension), high cholesterol, diabetes, or obesity.  Manage your cholesterol levels.  Making food choices that are high in fiber and low in saturated fat, trans fat, and cholesterol may control  cholesterol levels.  Take any prescribed medicines to control cholesterol as directed by your health care provider.  Manage your diabetes.  Controlling your carbohydrate and sugar intake is recommended to manage diabetes.  Take any prescribed medicines to control diabetes as directed by your health care provider.  Control your hypertension.  Making food choices that are low in salt (sodium), saturated fat, trans fat, and cholesterol is recommended to manage hypertension.  Take any prescribed medicines to control hypertension as directed by your health care provider.  Maintain a healthy weight.  Reducing calorie intake and making food choices that are low in sodium, saturated fat, trans fat, and cholesterol are recommended to manage weight.  Stop drug abuse.  Avoid taking birth control pills.  Talk to your health care provider about the risks of taking birth control pills if you are over 27 years old, smoke, get migraines, or have ever had a blood clot.  Get evaluated for sleep disorders (sleep apnea).  Talk to your health care provider about getting a sleep evaluation if you snore a lot or have excessive sleepiness.  Take medicines only as directed by your health care provider.  For some people, aspirin or blood thinners (anticoagulants) are helpful in reducing the risk of forming abnormal blood clots that can lead to stroke. If you have the irregular heart rhythm of atrial fibrillation, you should be on a blood thinner unless there is a good reason you cannot take them.  Understand all your medicine instructions.  Make sure that other conditions (such as anemia or atherosclerosis) are addressed. SEEK IMMEDIATE MEDICAL CARE IF:   You  have sudden weakness or numbness of the face, arm, or leg, especially on one side of the body.  Your face or eyelid droops to one side.  You have sudden confusion.  You have trouble speaking (aphasia) or understanding.  You have sudden  trouble seeing in one or both eyes.  You have sudden trouble walking.  You have dizziness.  You have a loss of balance or coordination.  You have a sudden, severe headache with no known cause.  You have new chest pain or an irregular heartbeat. Any of these symptoms may represent a serious problem that is an emergency. Do not wait to see if the symptoms will go away. Get medical help at once. Call your local emergency services (911 in [place] Do not drive yourself to the hospital. Document Released: 08/01/2004 Document Revised: 11/08/2013 Document Reviewed: 12/25/2012 [facility] Patient Information 2015 [place], [place]. This information is not intended to replace advice given to you by your health care provider. Make sure you discuss any questions you have with your health care provider.

## 2015-03-23 NOTE — Progress Notes (Signed)
[facility] [address] [place]. [place] [address] [phone]       OFFICE FOLLOW-UP NOTE  Angela. Angela Keith Date of Birth:  [birth year] Medical Record Number:  [id number]   HPI: Angela Keith is a 62 year lady seen today for the first office follow-up visit following hospital admission for stroke in July 2016.Angela Keith is an 81 y.o. female with a past medical history significant for HTN, hyperlipidemia, presumed CVA (cerebral infarction) s/p IV tPA, MRI negative, GERD, who underwent uncomplicated lumbar decompressive laminectomy and foraminotomies on 01/17/15, brought in for further evaluation of intermittent left sided weakness, left face droop. Patient was doing well after discharge home, ambulating without assistance, but family indicated that yesterday around 4 pm (LKW 01/17/2015 at 1600) patient was noted to be intermittently dragging the left leg and complaining that the left leg was numb and weak. In addition, family also noted episodes in which her left arm was weak. Further, noted to have left face droopiness. As per patient and family, such episodes of left LE weakness can lasted for approximately one hour. She said that the left leg was weaker than the right leg before surgery. Complains of a mild frontal HA but denies vertigo, double vision, difficulty swallowing, unsteadiness, slurred speech, confusion, fatigue, language or vision impairment. Having intermittent cramps right LE. CT brain was reviewed and showed no acute abnormality. It is worth noting that she has been off plavix for 4 days due to recent surgery. Patient was not administered TPA secondary to surgery 7/12 and mild intermittent deficits. She was admitted for further evaluation and treatment. CT scan of the head on admission showed no acute abnormality but evidence of old left lacunar infarct in the basal ganglia. MRI scan which I have personally reviewed showed an acute infarct in the morning right  medial motor strip in the anterior cerebral artery distribution with MRA showing A2 segment occlusion. Small punctate acute infarct is also noted involving left medial thalamus. Transthoracic echo showed normal ejection fraction. TEE was done which did not show any evidence of cardiac source of embolism. Patient had loop recorder implanted and  paroxysmal atrial fibrillation  has   been discovered one month ago for which patient has been changed from Plavix to eliquis now.. Patient previously had LDL cholesterol in March 2016 which is elevated at 137 and hemoglobin A1c which was 6.0. Carotid ultrasound showed no significant extracranial stenosis. Patient was started on Plavix for stroke prevention and transferred to inpatient rehabilitation where she stayed for to 3 weeks and subsequently she was transferred to [facility] where she is currently getting rehabilitation. The patient has shown slight improvement but still needs 1 person assist to stand with a handrail and walk. She still has mild left-sided neglect and significant left leg weakness and has only minimum hip flexion movements. Left arm strength is improving but she still has significant weakness of grip. The patient did have significant lower extremity spasms which were thought to be myoclonus versus glomus during the hospitalization for which she was started on baclofen and Neurontin which seem to be somewhat improved now but still persistent. She is also had issues with emotional lability and lack of motivation and participation in therapy. She is accompanied today by her physical therapist and daughter-in-law who provide most of the history.  ROS:   14 system review of systems is positive for weakness, difficulty walking, involuntary movements leg jerking and all other systems negative  PMH:  Past Medical  History  Diagnosis Date  . Hypothyroidism   . Arthritis   . GERD (gastroesophageal reflux disease)   . Dysphagia   . Hyperlipidemia     . Right bundle branch block 03/21/2014  . HTN (hypertension) 10/30/2012  . Unspecified hypothyroidism 10/30/2012  . Severe hearing loss   . DJD (degenerative joint disease), lumbar   . Rotoscoliosis   . Glaucoma   . DJD (degenerative joint disease), cervical   . Chronic neck pain   . Nocturia   . Insomnia   . Stroke   . Shortness of breath dyspnea     Social History:  Social History   Social History  . Marital Status: Married    Spouse Name: widowed now  . Number of Children: N/A  . Years of Education: 10   Occupational History  . retired     Veterinary surgeon, office work   Social History Main Topics  . Smoking status: Never Smoker   . Smokeless tobacco: Not on file  . Alcohol Use: No  . Drug Use: No  . Sexual Activity: No   Other Topics Concern  . Not on file   Social History Narrative   Widowed, 2 sons   Right handed   caffeine use- none    Medications:   Current Outpatient Prescriptions on File Prior to Visit  Medication Sig Dispense Refill  . apixaban (ELIQUIS) 5 MG TABS tablet Take 1 tablet (5 mg total) by mouth 2 (two) times daily. 60 tablet 12  . benzonatate (TESSALON) 100 MG capsule Take 1 capsule (100 mg total) by mouth 3 (three) times daily as needed for cough. 20 capsule 0  . dorzolamide-timolol (COSOPT) 22.3-6.8 MG/ML ophthalmic solution Place 1 drop into both eyes 2 (two) times daily.    Marland Kitchen gabapentin (NEURONTIN) 300 MG capsule Take 200 mg by mouth 3 (three) times daily.     Marland Kitchen latanoprost (XALATAN) 0.005 % ophthalmic solution Place 1 drop into both eyes at bedtime.    Marland Kitchen levothyroxine (SYNTHROID, LEVOTHROID) 88 MCG tablet Take 88 mcg by mouth daily before breakfast.    . loratadine (CLARITIN) 10 MG tablet Take 10 mg by mouth daily.    . Melatonin 3 MG TABS Take 1 tablet (3 mg total) by mouth at bedtime. 30 tablet 6  . metoprolol tartrate (LOPRESSOR) 25 MG tablet Take 25 mg by mouth 2 (two) times daily.    . pantoprazole (PROTONIX) 40 MG tablet Take 40 mg  by mouth 2 (two) times daily.    . polyethylene glycol powder (GLYCOLAX/MIRALAX) powder Take 17 g by mouth daily. 3350 g 1  . pravastatin (PRAVACHOL) 40 MG tablet Take 1 tablet (40 mg total) by mouth daily at 6 PM. 30 tablet 2  . senna-docusate (SENOKOT-S) 8.6-50 MG per tablet Take 2 tablets by mouth daily. 30 tablet 6  . sodium chloride (OCEAN) 0.65 % SOLN nasal spray Place 1 spray into both nostrils as needed for congestion.    . tamsulosin (FLOMAX) 0.4 MG CAPS capsule Take 0.4 mg by mouth daily.    . traMADol (ULTRAM) 50 MG tablet Take 1 tablet (50 mg total) by mouth 3 (three) times daily. 90 tablet 5  . traZODone (DESYREL) 50 MG tablet Take 0.5 tablets (25 mg total) by mouth at bedtime. 5 tablet 0   No current facility-administered medications on file prior to visit.    Allergies:   Allergies  Allergen Reactions  . Ace Inhibitors Other (See Comments)    Severe fatigue  .  Lipitor [Atorvastatin] Other (See Comments)    Mood swings  . Motrin [Ibuprofen] Nausea And Vomiting    Physical Exam General: Frail elderly lady seated, in no evident distress Head: head normocephalic and atraumatic.  Neck: supple with no carotid or supraclavicular bruits Cardiovascular: regular rate and rhythm, no murmurs Musculoskeletal: no deformity Skin:  no rash/petichiae Vascular:  Normal pulses all extremities Filed Vitals:   03/22/15 0856  BP: 127/57  Pulse: 69   Neurologic Exam Mental Status: Awake and fully alert. Oriented to place and time. Recent and remote memory intact. Attention span, concentration and fund of knowledge appropriate. Mood and affect appropriate.  Cranial Nerves: Fundoscopic exam reveals sharp disc margins. Pupils equal, briskly reactive to light. Extraocular movements full without nystagmus. Visual fields full to confrontation. Hearing intact. Facial sensation intact. Face, tongue, palate moves normally and symmetrically.  Motor: Spastic left hemiparesis with grade 3/5  strength with significant weakness of left grip and intrinsic hand muscles. Left lower extremity strength is 1/5 with only minimum left hip flexion movements. Sensory.: intact to touch ,pinprick .position and vibratory sensation.  Coordination: Impaired on the left side and normal on the right.  Gait and Station: Arises from chair without difficulty. Stance is normal. Gait demonstrates normal stride length and balance . Able to heel, toe and tandem walk without difficulty.  Reflexes: 1+ and symmetric. Toes downgoing.   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 New diagnoses of paroxysmal atrial fibrillation discovered on loop recorder 1 month ago    PLAN: I had a long d/w patient, daughter in law and her physical therapist about her recent stroke, risk for recurrent stroke/TIAs, personally independently reviewed imaging studies and stroke evaluation results and answered questions.Continue Eliquis for secondary stroke prevention  given recent diagnosis of atrial fibrillationand maintain strict control of hypertension with blood pressure goal below 130/90, diabetes with hemoglobin A1c goal below 6.5% and lipids with LDL cholesterol goal below 100 mg/dL. I also advised the patient to eat a healthy diet with plenty of whole grains, cereals, fruits and vegetables, exercise regularly and maintain ideal body weight. Increase dose of baclofen to 20 mg at night to help with nocturnal spasms on spasticity. Continue Neurontin and the current dose of 300 mg 3 times daily but try to limit and reduce use of Ultram. Continue ongoing physical and occupational therapy and encourage ambulation.Greater than 50% of time during this 25 minute visit was spent on counseling, discussion with patient and family and coordination of care  Followup in the  future with me in  3 months or call earlier if necessary. Delia Heady, MD  Note: This document was prepared with digital dictation and possible smart phrase technology. Any transcriptional errors that result from this process are unintentional

## 2015-03-23 NOTE — Progress Notes (Signed)
Loop recorder 

## 2015-03-27 ENCOUNTER — Non-Acute Institutional Stay (SKILLED_NURSING_FACILITY): Payer: Medicare Other | Admitting: Nurse Practitioner

## 2015-03-27 DIAGNOSIS — G4700 Insomnia, unspecified: Secondary | ICD-10-CM | POA: Diagnosis not present

## 2015-03-27 DIAGNOSIS — F329 Major depressive disorder, single episode, unspecified: Secondary | ICD-10-CM

## 2015-03-27 DIAGNOSIS — M62838 Other muscle spasm: Secondary | ICD-10-CM

## 2015-03-27 DIAGNOSIS — F32A Depression, unspecified: Secondary | ICD-10-CM

## 2015-03-27 DIAGNOSIS — R630 Anorexia: Secondary | ICD-10-CM

## 2015-03-27 DIAGNOSIS — M5410 Radiculopathy, site unspecified: Secondary | ICD-10-CM | POA: Diagnosis not present

## 2015-03-27 NOTE — Progress Notes (Signed)
Patient ID: Angela Keith, female   DOB: 1930-03-08, 81 y.o.   MRN: [id number]    Nursing Home Location:  [facility]   Place of Service: SNF (31)  PCP: [name], MD  Allergies  Allergen Reactions  . Ace Inhibitors Other (See Comments)    Severe fatigue  . Lipitor [Atorvastatin] Other (See Comments)    Mood swings  . Motrin [Ibuprofen] Nausea And Vomiting    Chief Complaint  Patient presents with  . Acute Visit    HPI:  Patient is a 81 y.o. female seen today at [facility] for multiple family concerns. Pt with hx of of CVA, hypothyroidism, hyperlipidemia, urinary retention, htn, a fib, and cervical DJD. Daughter in law at bedside. Family notes pt has been more lethargic. Reports pain is better controlled and would like medication to be PRN at this time. Also reports baclofen was increased at bedtime at neurology visit and feels like this could be adding to the fatigue, would like to go back to previously q 6 hour dosing.  Daughter in law reports she is having to beg Ms Heavner to eat and she has had very little PO intake. Request an appetite stimulate. Also feels like she has some depression due her current situation with rehab and being at the SNF. Pt makes the comment she does not want to be a burden. Daughter in law reports she tried celexa in the past but she was not able to tolerate medication due to nausea.    Review of Systems:  Review of Systems  Constitutional: Negative for fever, chills and fatigue.  HENT: Negative for congestion and sore throat.   Eyes: Negative.   Respiratory: Negative for cough, shortness of breath and wheezing.   Cardiovascular: Negative for chest pain, palpitations and leg swelling.  Gastrointestinal: Negative for nausea, diarrhea and constipation.  Genitourinary: Negative.   Musculoskeletal: Positive for myalgias (currently without complaints of pain). Negative for back pain and joint swelling.  Skin:  Negative for color change, pallor, rash and wound.  Neurological: Negative for dizziness, speech difficulty, numbness and headaches.       Restlessness and jerking movement to right LE  Psychiatric/Behavioral: Positive for confusion.       Depression    Past Medical History  Diagnosis Date  . Hypothyroidism   . Arthritis   . GERD (gastroesophageal reflux disease)   . Dysphagia   . Hyperlipidemia   . Right bundle branch block 03/21/2014  . HTN (hypertension) 10/30/2012  . Unspecified hypothyroidism 10/30/2012  . Severe hearing loss   . DJD (degenerative joint disease), lumbar   . Rotoscoliosis   . Glaucoma   . DJD (degenerative joint disease), cervical   . Chronic neck pain   . Nocturia   . Insomnia   . Stroke   . Shortness of breath dyspnea    Past Surgical History  Procedure Laterality Date  . Breast surgery    . Back surgery      lower back   . Esophagogastroduodenoscopy (egd) with esophageal dilation  06/08/2012    Procedure: ESOPHAGOGASTRODUODENOSCOPY (EGD) WITH ESOPHAGEAL DILATION;  Surgeon: Charolett Bumpers, MD;  Location: [facility];  Service: Endoscopy;  Laterality: N/A;  . Bladder laser surgery    . Left carpal tunnel surgery    . Facail surgery  2000    facelift and eyelid lift  . Cataract surgery    . Lumbar laminectomy/decompression microdiscectomy N/A 01/17/2015    Procedure:  Laminectomy and Foraminotomy - L2-L3 - L3-L4;  Surgeon: [name], MD;  Location: [facility];  Service: Neurosurgery;  Laterality: N/A;  Laminectomy and Foraminotomy - L2-L3 - L3-L4  . Ep implantable device N/A 01/19/2015    Procedure: Loop Recorder Insertion;  Surgeon: Marinus Maw, MD;  Location: [facility] INVASIVE CV LAB;  Service: Cardiovascular;  Laterality: N/A;  . Tee without cardioversion N/A 01/19/2015    Procedure: TRANSESOPHAGEAL ECHOCARDIOGRAM (TEE);  Surgeon: Chrystie Nose, MD;  Location: [facility] END[facility]COPY;  Service: Cardiovascular;  Laterality: N/A;   Social History:   reports  that she has never smoked. She does not have any smokeless tobacco history on file. She reports that she does not drink alcohol or use illicit drugs.  Family History  Problem Relation Age of Onset  . CAD Father   . Stroke Father   . CAD Brother   . Diabetes Mother   . Lung cancer Other     Medications: Patient's Medications  New Prescriptions   No medications on file  Previous Medications   APIXABAN (ELIQUIS) 5 MG TABS TABLET    Take 1 tablet (5 mg total) by mouth 2 (two) times daily.   BACLOFEN (LIORESAL) 10 MG TABLET    Take 1 tablet (10 mg total) by mouth 3 (three) times daily. And 20 mg at bedtime   BENZONATATE (TESSALON) 100 MG CAPSULE    Take 1 capsule (100 mg total) by mouth 3 (three) times daily as needed for cough.   COLLAGENASE (SANTYL) OINTMENT    Apply 1 application topically daily.   DORZOLAMIDE-TIMOLOL (COSOPT) 22.3-6.8 MG/ML OPHTHALMIC SOLUTION    Place 1 drop into both eyes 2 (two) times daily.   GABAPENTIN (NEURONTIN) 300 MG CAPSULE    Take 200 mg by mouth 3 (three) times daily.    LATANOPROST (XALATAN) 0.005 % OPHTHALMIC SOLUTION    Place 1 drop into both eyes at bedtime.   LEVOTHYROXINE (SYNTHROID, LEVOTHROID) 88 MCG TABLET    Take 88 mcg by mouth daily before breakfast.   LIDOCAINE (LIDODERM) 5 %    Place 1 patch onto the skin daily. Remove & Discard patch within 12 hours or as directed by MD   LORATADINE (CLARITIN) 10 MG TABLET    Take 10 mg by mouth daily.   MELATONIN 3 MG TABS    Take 1 tablet (3 mg total) by mouth at bedtime.   METOPROLOL TARTRATE (LOPRESSOR) 25 MG TABLET    Take 25 mg by mouth 2 (two) times daily.   NITROFURANTOIN (MACRODANTIN) 100 MG CAPSULE    Take 100 mg by mouth 4 (four) times daily.   PANTOPRAZOLE (PROTONIX) 40 MG TABLET    Take 40 mg by mouth 2 (two) times daily.   POLYETHYLENE GLYCOL POWDER (GLYCOLAX/MIRALAX) POWDER    Take 17 g by mouth daily.   PRAVASTATIN (PRAVACHOL) 40 MG TABLET    Take 1 tablet (40 mg total) by mouth daily at 6 PM.     SENNA-DOCUSATE (SENOKOT-S) 8.6-50 MG PER TABLET    Take 2 tablets by mouth daily.   SODIUM CHLORIDE (OCEAN) 0.65 % SOLN NASAL SPRAY    Place 1 spray into both nostrils as needed for congestion.   TAMSULOSIN (FLOMAX) 0.4 MG CAPS CAPSULE    Take 0.4 mg by mouth daily.   TRAMADOL (ULTRAM) 50 MG TABLET    Take 1 tablet (50 mg total) by mouth 3 (three) times daily.   TRAZODONE (DESYREL) 50 MG TABLET    Take  0.5 tablets (25 mg total) by mouth at bedtime.  Modified Medications   No medications on file  Discontinued Medications   No medications on file     Physical Exam: Filed Vitals:   03/27/15 1737  BP: 111/74  Pulse: 70  Temp: 98.1 F (36.7 C)  Resp: 18    Physical Exam  Constitutional: She appears well-developed. No distress.  HENT:  Head: Normocephalic and atraumatic.  Mouth/Throat: Oropharynx is clear and moist. No oropharyngeal exudate.  Eyes: Pupils are equal, round, and reactive to light.  Neck: Normal range of motion. Neck supple.  Cardiovascular: Normal rate, regular rhythm and normal heart sounds.   Pulmonary/Chest: Effort normal and breath sounds normal. She has no wheezes.  Abdominal: Soft. Bowel sounds are normal.  Genitourinary:  Has indwelling urinary catheter.  Musculoskeletal:  ROM decreased in left leg  Lymphadenopathy:    She has no cervical adenopathy.  Neurological: She is alert.  Skin: Skin is warm and dry. She is not diaphoretic.  Psychiatric: She has a normal mood and affect.    Labs reviewed: Basic Metabolic Panel:  Recent Labs  16/10/96 1837  01/31/15 0519 02/07/15 0506 02/08/15 0539  NA  --   < > 134* 128* 136  K  --   < > 4.5 4.5 4.6  CL  --   < > 100* 96* 100*  CO2  --   < > GLUCOSE  --   < > 106* 103* 91  BUN  --   < > CREATININE  --   < > 1.08* 0.84 1.04*  CALCIUM  --   < > 9.5 9.5 9.8  MG 2.0  --  2.0  --   --   < > = values in this interval not displayed. Liver Function Tests:  Recent Labs   01/18/15 0341 01/23/15 0529 01/31/15 0519  AST ALT ALKPHOS 47 46 56  BILITOT 0.7 0.7 0.4  PROT 6.7 6.5 6.3*  ALBUMIN 3.8 2.7* 3.0*   No results for input(s): LIPASE, AMYLASE in the last 8760 hours. No results for input(s): AMMONIA in the last 8760 hours. CBC:  Recent Labs  01/11/15 1333 01/18/15 0341  01/23/15 0529 01/31/15 0519 02/07/15 0506  WBC 6.5 10.9*  < > 8.1 9.0 5.5  NEUTROABS 3.3 7.9*  --  5.8  --   --   HGB 13.0 11.5*  < > 11.7* 12.1 11.7*  HCT 40.4 34.8*  < > 35.1* 36.8 35.3*  MCV 87.4 85.3  < > 84.8 87.4 87.8  PLT 261 224  < > 291 373 298  < > = values in this interval not displayed. TSH:  Recent Labs  02/08/15 0539  TSH 0.876   A1C: Lab Results  Component Value Date   HGBA1C 6.1* 01/19/2015   Lipid Panel:  Recent Labs  09/06/14 0224 01/19/15 0452  CHOL 200 122  HDL 53 52  LDLCALC 137* 66  TRIG 51 20  CHOLHDL 3.8 2.3      Assessment/Plan 1. Back pain with left-sided radiculopathy -pain has improved, medication being held frequently due to no pain or increased lethargy. Will change medication to tramadol 25-50 mg q 4 hours PRN, staff to assess pain every 6 hours because pt reports she does not want to ask for medication, she is worried she will be a bother. Will cont to monitor.   2. Decreased appetite -family having  to beg pt to eat, taking minimally PO. No trouble swallowing or pain.  -will start remeron 7.5 mg PO qhs for appetite, will also benefit due to low mood.   3. Muscle spasm of left lower extremity Ongoing, feels like increase baclofen dose could be leading to over sedation. Will decrease back to 10 mg q 6 hours and monitor, may need to adjust again if spasms worsen at night.  -will follow up bmp  4. Insomnia -will dc melatonin since starting remeron   5. Depression -will monitor and start of Remeron should help improve mood   Jessica K. Biagio Borg  [facility] [phone] 8 am - 5 pm) [phone] (after hours)

## 2015-03-28 ENCOUNTER — Other Ambulatory Visit: Payer: Self-pay | Admitting: *Deleted

## 2015-03-28 MED ORDER — TRAMADOL HCL 50 MG PO TABS: ORAL_TABLET | ORAL | Status: DC

## 2015-03-28 NOTE — Telephone Encounter (Signed)
[facility] 

## 2015-04-04 ENCOUNTER — Encounter: Payer: Medicare Other | Admitting: Physical Medicine & Rehabilitation

## 2015-04-19 ENCOUNTER — Ambulatory Visit (INDEPENDENT_AMBULATORY_CARE_PROVIDER_SITE_OTHER): Payer: Medicare Other | Admitting: *Deleted

## 2015-04-19 DIAGNOSIS — I6330 Cerebral infarction due to thrombosis of unspecified cerebral artery: Secondary | ICD-10-CM

## 2015-04-20 DIAGNOSIS — N319 Neuromuscular dysfunction of bladder, unspecified: Secondary | ICD-10-CM | POA: Diagnosis not present

## 2015-04-20 DIAGNOSIS — R339 Retention of urine, unspecified: Secondary | ICD-10-CM | POA: Diagnosis not present

## 2015-04-20 NOTE — Progress Notes (Signed)
Loop recorder 

## 2015-04-23 LAB — CUP PACEART REMOTE DEVICE CHECK: MDC IDC SESS DTM: 20160913150532

## 2015-04-23 NOTE — Progress Notes (Signed)
Carelink summary report received. Battery status OK. Normal device function. No new symptom, brady, or pause episodes. 9 tachy episodes, EGMs suggest RVR, longest 3min 45sec, peak V 167bpm, +metoprolol. 75 AF episodes (burden 0.9%), +Eliquis, avg V rates controlled. Monthly summary reports and ROV with GT on 06/06/15 at 11:30am.

## 2015-04-24 ENCOUNTER — Non-Acute Institutional Stay (SKILLED_NURSING_FACILITY): Payer: Medicare Other | Admitting: Nurse Practitioner

## 2015-04-24 DIAGNOSIS — I6310 Cerebral infarction due to embolism of unspecified precerebral artery: Secondary | ICD-10-CM

## 2015-04-24 DIAGNOSIS — I10 Essential (primary) hypertension: Secondary | ICD-10-CM | POA: Diagnosis not present

## 2015-04-24 DIAGNOSIS — F32A Depression, unspecified: Secondary | ICD-10-CM

## 2015-04-24 DIAGNOSIS — G4700 Insomnia, unspecified: Secondary | ICD-10-CM

## 2015-04-24 DIAGNOSIS — E785 Hyperlipidemia, unspecified: Secondary | ICD-10-CM

## 2015-04-24 DIAGNOSIS — R630 Anorexia: Secondary | ICD-10-CM

## 2015-04-24 DIAGNOSIS — M5410 Radiculopathy, site unspecified: Secondary | ICD-10-CM | POA: Diagnosis not present

## 2015-04-24 DIAGNOSIS — E039 Hypothyroidism, unspecified: Secondary | ICD-10-CM | POA: Diagnosis not present

## 2015-04-24 DIAGNOSIS — G8194 Hemiplegia, unspecified affecting left nondominant side: Secondary | ICD-10-CM

## 2015-04-24 DIAGNOSIS — F329 Major depressive disorder, single episode, unspecified: Secondary | ICD-10-CM

## 2015-04-24 LAB — CUP PACEART REMOTE DEVICE CHECK: MDC IDC SESS DTM: 20161012193641

## 2015-04-24 NOTE — Progress Notes (Signed)
Carelink summary report received. Battery status OK. Normal device function. No new symptom, tachy, brady, or pause episodes. 34 AF episodes (burden 0.3%), +Eliquis, avg V rates well controlled. Monthly summary reports and ROV with GT on 06/06/15 at 11:30am.

## 2015-04-24 NOTE — Progress Notes (Signed)
Patient ID: Angela Keith, female   DOB: 05-12-1930, 81 y.o.   MRN: [id number]    Nursing Home Location:  [facility]   Place of Service: SNF (31)  PCP: [name], MD  Allergies  Allergen Reactions  . Ace Inhibitors Other (See Comments)    Severe fatigue  . Lipitor [Atorvastatin] Other (See Comments)    Mood swings  . Motrin [Ibuprofen] Nausea And Vomiting    Chief Complaint  Patient presents with  . Discharge Note    HPI:  Patient is a 81 y.o. female seen today at [facility] for discharge.  She has a past medical history of lumbar stenosis with radiculopathy and underwent lumbar decompression of L2-4 on 01/17/15.  She was discharged home post surgery, but returned the next day with left sided weakness. MRI/MRA showed right distal ACA segment occlusion with acute right posterior right ACA infarct.  A loop recorder was placed on 01/19/15 and she was found to have paroxysmal Afib.  Her plavix was changed to Eliquis.    She was admitted to [facility] on 02/10/15.Since here, she has worked with PT, OT and speech. She still requires assistance moving from bed to wheelchair as well as sitting to standing.  Patient still displays decreased strength and no purposeful movement in her LLE.  She can self propel her own wheelchair.Speech has evaluated her swallowing and she is on a non restricted diet and can take thin liquids.   She also had a chronic foley catheter placed in inpatient rehab and continues to have it now.  Followed by Urology, patient has an appointment for urodynamics with Dr. Logan Bores on 10/20 at 10am.  Further orders to come from Urology.   Pain has been well controlled on current regimen with tramadol PRN.  Patient currently doing well with therapy, now stable to discharge home with home health.     Review of Systems:  Review of Systems  Constitutional: Negative for fever, chills and diaphoresis.  HENT: Negative for congestion,  postnasal drip and sore throat.   Respiratory: Negative for cough, shortness of breath and wheezing.   Cardiovascular: Negative for chest pain, palpitations and leg swelling.  Gastrointestinal: Negative for nausea, abdominal pain, diarrhea and constipation.  Genitourinary: Negative for dysuria, frequency and flank pain.       Chronic foley  Musculoskeletal: Negative for myalgias, back pain and arthralgias.       LLE movement is very limited.   Skin: Negative for color change, pallor, rash and wound.  Neurological: Negative for dizziness, light-headedness and headaches.  Psychiatric/Behavioral: Negative for sleep disturbance and agitation. The patient is not nervous/anxious.     Past Medical History  Diagnosis Date  . Hypothyroidism   . Arthritis   . GERD (gastroesophageal reflux disease)   . Dysphagia   . Hyperlipidemia   . Right bundle branch block 03/21/2014  . HTN (hypertension) 10/30/2012  . Unspecified hypothyroidism 10/30/2012  . Severe hearing loss   . DJD (degenerative joint disease), lumbar   . Rotoscoliosis   . Glaucoma   . DJD (degenerative joint disease), cervical   . Chronic neck pain   . Nocturia   . Insomnia   . Stroke   . Shortness of breath dyspnea    Past Surgical History  Procedure Laterality Date  . Breast surgery    . Back surgery      lower back   . Esophagogastroduodenoscopy (egd) with esophageal dilation  06/08/2012  Procedure: ESOPHAGOGASTRODUODENOSCOPY (EGD) WITH ESOPHAGEAL DILATION;  Surgeon: Charolett BumpersMartin K Johnson, MD;  Location: [facility];  Service: Endoscopy;  Laterality: N/A;  . Bladder laser surgery    . Left carpal tunnel surgery    . Facail surgery  2000    facelift and eyelid lift  . Cataract surgery    . Lumbar laminectomy/decompression microdiscectomy N/A 01/17/2015    Procedure: Laminectomy and Foraminotomy - L2-L3 - L3-L4;  Surgeon: Julio SicksHenry Pool, MD;  Location: [facility];  Service: Neurosurgery;  Laterality: N/A;  Laminectomy and  Foraminotomy - L2-L3 - L3-L4  . Ep implantable device N/A 01/19/2015    Procedure: Loop Recorder Insertion;  Surgeon: Marinus MawGregg W Taylor, MD;  [facility];  Service: Cardiovascular;  Laterality: N/A;  . Tee without cardioversion N/A 01/19/2015    Procedure: TRANSESOPHAGEAL ECHOCARDIOGRAM (TEE);  Surgeon: Chrystie NoseKenneth C Hilty, MD;  [facility];  Service: Cardiovascular;  Laterality: N/A;   Social History:   reports that she has never smoked. She does not have any smokeless tobacco history on file. She reports that she does not drink alcohol or use illicit drugs.  Family History  Problem Relation Age of Onset  . CAD Father   . Stroke Father   . CAD Brother   . Diabetes Mother   . Lung cancer Other     Medications: Patient's Medications  New Prescriptions   No medications on file  Previous Medications   APIXABAN (ELIQUIS) 5 MG TABS TABLET    Take 1 tablet (5 mg total) by mouth 2 (two) times daily.   BACLOFEN (LIORESAL) 10 MG TABLET    Take 1 tablet (10 mg total) by mouth 3 (three) times daily. And 20 mg at bedtime   BENZONATATE (TESSALON) 100 MG CAPSULE    Take 1 capsule (100 mg total) by mouth 3 (three) times daily as needed for cough.   COLLAGENASE (SANTYL) OINTMENT    Apply 1 application topically daily.   DORZOLAMIDE-TIMOLOL (COSOPT) 22.3-6.8 MG/ML OPHTHALMIC SOLUTION    Place 1 drop into both eyes 2 (two) times daily.   GABAPENTIN (NEURONTIN) 300 MG CAPSULE    Take 200 mg by mouth 3 (three) times daily.    LATANOPROST (XALATAN) 0.005 % OPHTHALMIC SOLUTION    Place 1 drop into both eyes at bedtime.   LEVOTHYROXINE (SYNTHROID, LEVOTHROID) 88 MCG TABLET    Take 88 mcg by mouth daily before breakfast.   LIDOCAINE (LIDODERM) 5 %    Place 1 patch onto the skin daily. Remove & Discard patch within 12 hours or as directed by MD   LORATADINE (CLARITIN) 10 MG TABLET    Take 10 mg by mouth daily.   MELATONIN 3 MG TABS    Take 1 tablet (3 mg total) by mouth at bedtime.    METOPROLOL TARTRATE (LOPRESSOR) 25 MG TABLET    Take 25 mg by mouth 2 (two) times daily.   NITROFURANTOIN (MACRODANTIN) 100 MG CAPSULE    Take 100 mg by mouth 4 (four) times daily.   PANTOPRAZOLE (PROTONIX) 40 MG TABLET    Take 40 mg by mouth 2 (two) times daily.   POLYETHYLENE GLYCOL POWDER (GLYCOLAX/MIRALAX) POWDER    Take 17 g by mouth daily.   PRAVASTATIN (PRAVACHOL) 40 MG TABLET    Take 1 tablet (40 mg total) by mouth daily at 6 PM.   SENNA-DOCUSATE (SENOKOT-S) 8.6-50 MG PER TABLET    Take 2 tablets by mouth daily.   SODIUM CHLORIDE (OCEAN) 0.65 %  SOLN NASAL SPRAY    Place 1 spray into both nostrils as needed for congestion.   TAMSULOSIN (FLOMAX) 0.4 MG CAPS CAPSULE    Take 0.4 mg by mouth daily.   TRAMADOL (ULTRAM) 50 MG TABLET    Take 1/2 tablet by mouth every 4 hours as needed for mild pain; Take one tablet by mouth every 4 hours as needed for moderate to severe pain   TRAZODONE (DESYREL) 50 MG TABLET    Take 0.5 tablets (25 mg total) by mouth at bedtime.  Modified Medications   No medications on file  Discontinued Medications   No medications on file     Physical Exam: Filed Vitals:   04/24/15 1212  BP: 137/77  Pulse: 78  Temp: 97.5 F (36.4 C)  TempSrc: Oral  Resp: 20  Weight: 142 lb 3.2 oz (64.501 kg)    Physical Exam  Constitutional: She is oriented to person, place, and time. She appears well-developed and well-nourished.  HENT:  Head: Normocephalic and atraumatic.  Mouth/Throat: Oropharynx is clear and moist. No oropharyngeal exudate.  Eyes: Pupils are equal, round, and reactive to light.  Neck: Normal range of motion. Neck supple.  Cardiovascular: Normal rate, regular rhythm and normal heart sounds.   Pulmonary/Chest: Effort normal and breath sounds normal.  Abdominal: Soft. Bowel sounds are normal.  Musculoskeletal: She exhibits no edema or tenderness.  Left sided weakness, LLE hemiplagia   Lymphadenopathy:    She has no cervical adenopathy.  Neurological:  She is alert and oriented to person, place, and time.  HOH, wears hearing aid  Skin: Skin is warm and dry.  Psychiatric: She has a normal mood and affect.    Labs reviewed: Basic Metabolic Panel:  Recent Labs  47/82/95 1837  01/31/15 0519 02/07/15 0506 02/08/15 0539  NA  --   < > 134* 128* 136  K  --   < > 4.5 4.5 4.6  CL  --   < > 100* 96* 100*  CO2  --   < > 25 24 30   GLUCOSE  --   < > 106* 103* 91  BUN  --   < > 13 11 10   CREATININE  --   < > 1.08* 0.84 1.04*  CALCIUM  --   < > 9.5 9.5 9.8  MG 2.0  --  2.0  --   --   < > = values in this interval not displayed. Liver Function Tests:  Recent Labs  01/18/15 0341 01/23/15 0529 01/31/15 0519  AST 27 24 26   ALT 17 17 23   ALKPHOS 47 46 56  BILITOT 0.7 0.7 0.4  PROT 6.7 6.5 6.3*  ALBUMIN 3.8 2.7* 3.0*   No results for input(s): LIPASE, AMYLASE in the last 8760 hours. No results for input(s): AMMONIA in the last 8760 hours. CBC:  Recent Labs  01/11/15 1333 01/18/15 0341  01/23/15 0529 01/31/15 0519 02/07/15 0506  WBC 6.5 10.9*  < > 8.1 9.0 5.5  NEUTROABS 3.3 7.9*  --  5.8  --   --   HGB 13.0 11.5*  < > 11.7* 12.1 11.7*  HCT 40.4 34.8*  < > 35.1* 36.8 35.3*  MCV 87.4 85.3  < > 84.8 87.4 87.8  PLT 261 224  < > 291 373 298  < > = values in this interval not displayed. TSH:  Recent Labs  02/08/15 0539  TSH 0.876   A1C: Lab Results  Component Value Date   HGBA1C 6.1* 01/19/2015  Lipid Panel:  Recent Labs  09/06/14 0224 01/19/15 0452  CHOL 200 122  HDL 53 52  LDLCALC 137* 66  TRIG 51 20  CHOLHDL 3.8 2.3    Assessment/Plan 1. Essential hypertension Stable.  Blood pressures within desirable range. Will continue Metoprolol.   2. Cerebrovascular accident (CVA) due to embolism of precerebral artery (HCC) Stable.  Followed by Neurology. Continue Eliquis for prevention.   3. Hypothyroidism, unspecified hypothyroidism type Stable.  Continue current dose of synthroid, last TSH in August was  within normal range.   4. Back pain with left-sided radiculopathy Stable. Pain controlled with prn tramadol. S/p decompressive laminectomies L2-3, L3-4  5. Left hemiparesis (HCC) Continues to have very limited movement in LLE. [name] is functional.   6. Hyperlipidemia LDL goal <70 Stable.  LDL was 69 in August. Continue current dose of pravastatin.    7. Depression Mood has improved, conts on remeron qhs  8. Insomnia Improved, stable on remeron   9. Decreased appetite Has improved. conts on remeron qhs.  pt is stable for discharge-will need PT/OT/HHA per home health. DME needed includes hospital bed, WC, 3N1. Rx written.  will need to follow up with PCP within 2 weeks.      Janene Harvey. Biagio Borg  [facility] [phone] 8 am - 5 pm) [phone] (after hours)

## 2015-04-27 DIAGNOSIS — R339 Retention of urine, unspecified: Secondary | ICD-10-CM | POA: Diagnosis not present

## 2015-05-03 DIAGNOSIS — M4726 Other spondylosis with radiculopathy, lumbar region: Secondary | ICD-10-CM | POA: Diagnosis not present

## 2015-05-03 DIAGNOSIS — M4722 Other spondylosis with radiculopathy, cervical region: Secondary | ICD-10-CM | POA: Diagnosis not present

## 2015-05-03 DIAGNOSIS — I480 Paroxysmal atrial fibrillation: Secondary | ICD-10-CM | POA: Diagnosis not present

## 2015-05-03 DIAGNOSIS — R296 Repeated falls: Secondary | ICD-10-CM | POA: Diagnosis not present

## 2015-05-03 DIAGNOSIS — I4510 Unspecified right bundle-branch block: Secondary | ICD-10-CM | POA: Diagnosis not present

## 2015-05-03 DIAGNOSIS — I69354 Hemiplegia and hemiparesis following cerebral infarction affecting left non-dominant side: Secondary | ICD-10-CM | POA: Diagnosis not present

## 2015-05-04 DIAGNOSIS — R296 Repeated falls: Secondary | ICD-10-CM | POA: Diagnosis not present

## 2015-05-04 DIAGNOSIS — I69354 Hemiplegia and hemiparesis following cerebral infarction affecting left non-dominant side: Secondary | ICD-10-CM | POA: Diagnosis not present

## 2015-05-04 DIAGNOSIS — I4510 Unspecified right bundle-branch block: Secondary | ICD-10-CM | POA: Diagnosis not present

## 2015-05-04 DIAGNOSIS — I480 Paroxysmal atrial fibrillation: Secondary | ICD-10-CM | POA: Diagnosis not present

## 2015-05-04 DIAGNOSIS — M4726 Other spondylosis with radiculopathy, lumbar region: Secondary | ICD-10-CM | POA: Diagnosis not present

## 2015-05-04 DIAGNOSIS — M4722 Other spondylosis with radiculopathy, cervical region: Secondary | ICD-10-CM | POA: Diagnosis not present

## 2015-05-05 DIAGNOSIS — I4510 Unspecified right bundle-branch block: Secondary | ICD-10-CM | POA: Diagnosis not present

## 2015-05-05 DIAGNOSIS — M4722 Other spondylosis with radiculopathy, cervical region: Secondary | ICD-10-CM | POA: Diagnosis not present

## 2015-05-05 DIAGNOSIS — I480 Paroxysmal atrial fibrillation: Secondary | ICD-10-CM | POA: Diagnosis not present

## 2015-05-05 DIAGNOSIS — R296 Repeated falls: Secondary | ICD-10-CM | POA: Diagnosis not present

## 2015-05-05 DIAGNOSIS — I69354 Hemiplegia and hemiparesis following cerebral infarction affecting left non-dominant side: Secondary | ICD-10-CM | POA: Diagnosis not present

## 2015-05-05 DIAGNOSIS — M4726 Other spondylosis with radiculopathy, lumbar region: Secondary | ICD-10-CM | POA: Diagnosis not present

## 2015-05-08 DIAGNOSIS — I4510 Unspecified right bundle-branch block: Secondary | ICD-10-CM | POA: Diagnosis not present

## 2015-05-08 DIAGNOSIS — M4722 Other spondylosis with radiculopathy, cervical region: Secondary | ICD-10-CM | POA: Diagnosis not present

## 2015-05-08 DIAGNOSIS — I69354 Hemiplegia and hemiparesis following cerebral infarction affecting left non-dominant side: Secondary | ICD-10-CM | POA: Diagnosis not present

## 2015-05-08 DIAGNOSIS — I480 Paroxysmal atrial fibrillation: Secondary | ICD-10-CM | POA: Diagnosis not present

## 2015-05-08 DIAGNOSIS — R296 Repeated falls: Secondary | ICD-10-CM | POA: Diagnosis not present

## 2015-05-08 DIAGNOSIS — M4726 Other spondylosis with radiculopathy, lumbar region: Secondary | ICD-10-CM | POA: Diagnosis not present

## 2015-05-10 DIAGNOSIS — I480 Paroxysmal atrial fibrillation: Secondary | ICD-10-CM | POA: Diagnosis not present

## 2015-05-10 DIAGNOSIS — R296 Repeated falls: Secondary | ICD-10-CM | POA: Diagnosis not present

## 2015-05-10 DIAGNOSIS — M4726 Other spondylosis with radiculopathy, lumbar region: Secondary | ICD-10-CM | POA: Diagnosis not present

## 2015-05-10 DIAGNOSIS — I4510 Unspecified right bundle-branch block: Secondary | ICD-10-CM | POA: Diagnosis not present

## 2015-05-10 DIAGNOSIS — M4722 Other spondylosis with radiculopathy, cervical region: Secondary | ICD-10-CM | POA: Diagnosis not present

## 2015-05-10 DIAGNOSIS — I69354 Hemiplegia and hemiparesis following cerebral infarction affecting left non-dominant side: Secondary | ICD-10-CM | POA: Diagnosis not present

## 2015-05-11 DIAGNOSIS — I69354 Hemiplegia and hemiparesis following cerebral infarction affecting left non-dominant side: Secondary | ICD-10-CM | POA: Diagnosis not present

## 2015-05-11 DIAGNOSIS — I480 Paroxysmal atrial fibrillation: Secondary | ICD-10-CM | POA: Diagnosis not present

## 2015-05-11 DIAGNOSIS — R296 Repeated falls: Secondary | ICD-10-CM | POA: Diagnosis not present

## 2015-05-11 DIAGNOSIS — M4722 Other spondylosis with radiculopathy, cervical region: Secondary | ICD-10-CM | POA: Diagnosis not present

## 2015-05-11 DIAGNOSIS — M4726 Other spondylosis with radiculopathy, lumbar region: Secondary | ICD-10-CM | POA: Diagnosis not present

## 2015-05-11 DIAGNOSIS — I4510 Unspecified right bundle-branch block: Secondary | ICD-10-CM | POA: Diagnosis not present

## 2015-05-15 DIAGNOSIS — I480 Paroxysmal atrial fibrillation: Secondary | ICD-10-CM | POA: Diagnosis not present

## 2015-05-15 DIAGNOSIS — M4722 Other spondylosis with radiculopathy, cervical region: Secondary | ICD-10-CM | POA: Diagnosis not present

## 2015-05-15 DIAGNOSIS — I69354 Hemiplegia and hemiparesis following cerebral infarction affecting left non-dominant side: Secondary | ICD-10-CM | POA: Diagnosis not present

## 2015-05-15 DIAGNOSIS — R296 Repeated falls: Secondary | ICD-10-CM | POA: Diagnosis not present

## 2015-05-15 DIAGNOSIS — M4726 Other spondylosis with radiculopathy, lumbar region: Secondary | ICD-10-CM | POA: Diagnosis not present

## 2015-05-15 DIAGNOSIS — I4510 Unspecified right bundle-branch block: Secondary | ICD-10-CM | POA: Diagnosis not present

## 2015-05-16 DIAGNOSIS — I69354 Hemiplegia and hemiparesis following cerebral infarction affecting left non-dominant side: Secondary | ICD-10-CM | POA: Diagnosis not present

## 2015-05-16 DIAGNOSIS — I480 Paroxysmal atrial fibrillation: Secondary | ICD-10-CM | POA: Diagnosis not present

## 2015-05-16 DIAGNOSIS — M4722 Other spondylosis with radiculopathy, cervical region: Secondary | ICD-10-CM | POA: Diagnosis not present

## 2015-05-16 DIAGNOSIS — R296 Repeated falls: Secondary | ICD-10-CM | POA: Diagnosis not present

## 2015-05-16 DIAGNOSIS — I4510 Unspecified right bundle-branch block: Secondary | ICD-10-CM | POA: Diagnosis not present

## 2015-05-16 DIAGNOSIS — M4726 Other spondylosis with radiculopathy, lumbar region: Secondary | ICD-10-CM | POA: Diagnosis not present

## 2015-05-17 DIAGNOSIS — M4726 Other spondylosis with radiculopathy, lumbar region: Secondary | ICD-10-CM | POA: Diagnosis not present

## 2015-05-17 DIAGNOSIS — I480 Paroxysmal atrial fibrillation: Secondary | ICD-10-CM | POA: Diagnosis not present

## 2015-05-17 DIAGNOSIS — I4510 Unspecified right bundle-branch block: Secondary | ICD-10-CM | POA: Diagnosis not present

## 2015-05-17 DIAGNOSIS — I69354 Hemiplegia and hemiparesis following cerebral infarction affecting left non-dominant side: Secondary | ICD-10-CM | POA: Diagnosis not present

## 2015-05-17 DIAGNOSIS — R296 Repeated falls: Secondary | ICD-10-CM | POA: Diagnosis not present

## 2015-05-17 DIAGNOSIS — M4722 Other spondylosis with radiculopathy, cervical region: Secondary | ICD-10-CM | POA: Diagnosis not present

## 2015-05-18 DIAGNOSIS — G890 Central pain syndrome: Secondary | ICD-10-CM | POA: Diagnosis not present

## 2015-05-18 DIAGNOSIS — R05 Cough: Secondary | ICD-10-CM | POA: Diagnosis not present

## 2015-05-18 DIAGNOSIS — M62838 Other muscle spasm: Secondary | ICD-10-CM | POA: Diagnosis not present

## 2015-05-18 DIAGNOSIS — Z1389 Encounter for screening for other disorder: Secondary | ICD-10-CM | POA: Diagnosis not present

## 2015-05-18 DIAGNOSIS — F329 Major depressive disorder, single episode, unspecified: Secondary | ICD-10-CM | POA: Diagnosis not present

## 2015-05-18 DIAGNOSIS — G4700 Insomnia, unspecified: Secondary | ICD-10-CM | POA: Diagnosis not present

## 2015-05-18 DIAGNOSIS — M5416 Radiculopathy, lumbar region: Secondary | ICD-10-CM | POA: Diagnosis not present

## 2015-05-18 DIAGNOSIS — K219 Gastro-esophageal reflux disease without esophagitis: Secondary | ICD-10-CM | POA: Diagnosis not present

## 2015-05-18 DIAGNOSIS — I639 Cerebral infarction, unspecified: Secondary | ICD-10-CM | POA: Diagnosis not present

## 2015-05-18 DIAGNOSIS — N319 Neuromuscular dysfunction of bladder, unspecified: Secondary | ICD-10-CM | POA: Diagnosis not present

## 2015-05-18 DIAGNOSIS — I10 Essential (primary) hypertension: Secondary | ICD-10-CM | POA: Diagnosis not present

## 2015-05-19 ENCOUNTER — Ambulatory Visit (INDEPENDENT_AMBULATORY_CARE_PROVIDER_SITE_OTHER): Payer: Medicare Other | Admitting: *Deleted

## 2015-05-19 DIAGNOSIS — I480 Paroxysmal atrial fibrillation: Secondary | ICD-10-CM | POA: Diagnosis not present

## 2015-05-19 DIAGNOSIS — M4722 Other spondylosis with radiculopathy, cervical region: Secondary | ICD-10-CM | POA: Diagnosis not present

## 2015-05-19 DIAGNOSIS — R296 Repeated falls: Secondary | ICD-10-CM | POA: Diagnosis not present

## 2015-05-19 DIAGNOSIS — I4510 Unspecified right bundle-branch block: Secondary | ICD-10-CM | POA: Diagnosis not present

## 2015-05-19 DIAGNOSIS — I6330 Cerebral infarction due to thrombosis of unspecified cerebral artery: Secondary | ICD-10-CM | POA: Diagnosis not present

## 2015-05-19 DIAGNOSIS — I69354 Hemiplegia and hemiparesis following cerebral infarction affecting left non-dominant side: Secondary | ICD-10-CM | POA: Diagnosis not present

## 2015-05-19 DIAGNOSIS — M4726 Other spondylosis with radiculopathy, lumbar region: Secondary | ICD-10-CM | POA: Diagnosis not present

## 2015-05-22 DIAGNOSIS — I69354 Hemiplegia and hemiparesis following cerebral infarction affecting left non-dominant side: Secondary | ICD-10-CM | POA: Diagnosis not present

## 2015-05-22 DIAGNOSIS — I480 Paroxysmal atrial fibrillation: Secondary | ICD-10-CM | POA: Diagnosis not present

## 2015-05-22 DIAGNOSIS — M4722 Other spondylosis with radiculopathy, cervical region: Secondary | ICD-10-CM | POA: Diagnosis not present

## 2015-05-22 DIAGNOSIS — R296 Repeated falls: Secondary | ICD-10-CM | POA: Diagnosis not present

## 2015-05-22 DIAGNOSIS — I4510 Unspecified right bundle-branch block: Secondary | ICD-10-CM | POA: Diagnosis not present

## 2015-05-22 DIAGNOSIS — M4726 Other spondylosis with radiculopathy, lumbar region: Secondary | ICD-10-CM | POA: Diagnosis not present

## 2015-05-22 NOTE — Progress Notes (Signed)
Carelink summary report / loop recorder 

## 2015-05-23 DIAGNOSIS — R296 Repeated falls: Secondary | ICD-10-CM | POA: Diagnosis not present

## 2015-05-23 DIAGNOSIS — M4722 Other spondylosis with radiculopathy, cervical region: Secondary | ICD-10-CM | POA: Diagnosis not present

## 2015-05-23 DIAGNOSIS — M4726 Other spondylosis with radiculopathy, lumbar region: Secondary | ICD-10-CM | POA: Diagnosis not present

## 2015-05-23 DIAGNOSIS — I480 Paroxysmal atrial fibrillation: Secondary | ICD-10-CM | POA: Diagnosis not present

## 2015-05-23 DIAGNOSIS — I69354 Hemiplegia and hemiparesis following cerebral infarction affecting left non-dominant side: Secondary | ICD-10-CM | POA: Diagnosis not present

## 2015-05-23 DIAGNOSIS — I4510 Unspecified right bundle-branch block: Secondary | ICD-10-CM | POA: Diagnosis not present

## 2015-05-24 DIAGNOSIS — M4726 Other spondylosis with radiculopathy, lumbar region: Secondary | ICD-10-CM | POA: Diagnosis not present

## 2015-05-24 DIAGNOSIS — I480 Paroxysmal atrial fibrillation: Secondary | ICD-10-CM | POA: Diagnosis not present

## 2015-05-24 DIAGNOSIS — R296 Repeated falls: Secondary | ICD-10-CM | POA: Diagnosis not present

## 2015-05-24 DIAGNOSIS — M4722 Other spondylosis with radiculopathy, cervical region: Secondary | ICD-10-CM | POA: Diagnosis not present

## 2015-05-24 DIAGNOSIS — I69354 Hemiplegia and hemiparesis following cerebral infarction affecting left non-dominant side: Secondary | ICD-10-CM | POA: Diagnosis not present

## 2015-05-24 DIAGNOSIS — I4510 Unspecified right bundle-branch block: Secondary | ICD-10-CM | POA: Diagnosis not present

## 2015-05-26 DIAGNOSIS — M4722 Other spondylosis with radiculopathy, cervical region: Secondary | ICD-10-CM | POA: Diagnosis not present

## 2015-05-26 DIAGNOSIS — I69354 Hemiplegia and hemiparesis following cerebral infarction affecting left non-dominant side: Secondary | ICD-10-CM | POA: Diagnosis not present

## 2015-05-26 DIAGNOSIS — I4510 Unspecified right bundle-branch block: Secondary | ICD-10-CM | POA: Diagnosis not present

## 2015-05-26 DIAGNOSIS — M4726 Other spondylosis with radiculopathy, lumbar region: Secondary | ICD-10-CM | POA: Diagnosis not present

## 2015-05-26 DIAGNOSIS — R296 Repeated falls: Secondary | ICD-10-CM | POA: Diagnosis not present

## 2015-05-26 DIAGNOSIS — I480 Paroxysmal atrial fibrillation: Secondary | ICD-10-CM | POA: Diagnosis not present

## 2015-05-29 DIAGNOSIS — M4722 Other spondylosis with radiculopathy, cervical region: Secondary | ICD-10-CM | POA: Diagnosis not present

## 2015-05-29 DIAGNOSIS — I480 Paroxysmal atrial fibrillation: Secondary | ICD-10-CM | POA: Diagnosis not present

## 2015-05-29 DIAGNOSIS — R296 Repeated falls: Secondary | ICD-10-CM | POA: Diagnosis not present

## 2015-05-29 DIAGNOSIS — I4510 Unspecified right bundle-branch block: Secondary | ICD-10-CM | POA: Diagnosis not present

## 2015-05-29 DIAGNOSIS — M4726 Other spondylosis with radiculopathy, lumbar region: Secondary | ICD-10-CM | POA: Diagnosis not present

## 2015-05-29 DIAGNOSIS — I69354 Hemiplegia and hemiparesis following cerebral infarction affecting left non-dominant side: Secondary | ICD-10-CM | POA: Diagnosis not present

## 2015-05-30 DIAGNOSIS — M4722 Other spondylosis with radiculopathy, cervical region: Secondary | ICD-10-CM | POA: Diagnosis not present

## 2015-05-30 DIAGNOSIS — I480 Paroxysmal atrial fibrillation: Secondary | ICD-10-CM | POA: Diagnosis not present

## 2015-05-30 DIAGNOSIS — R296 Repeated falls: Secondary | ICD-10-CM | POA: Diagnosis not present

## 2015-05-30 DIAGNOSIS — M4726 Other spondylosis with radiculopathy, lumbar region: Secondary | ICD-10-CM | POA: Diagnosis not present

## 2015-05-30 DIAGNOSIS — I4510 Unspecified right bundle-branch block: Secondary | ICD-10-CM | POA: Diagnosis not present

## 2015-05-30 DIAGNOSIS — I69354 Hemiplegia and hemiparesis following cerebral infarction affecting left non-dominant side: Secondary | ICD-10-CM | POA: Diagnosis not present

## 2015-06-05 DIAGNOSIS — I480 Paroxysmal atrial fibrillation: Secondary | ICD-10-CM | POA: Diagnosis not present

## 2015-06-05 DIAGNOSIS — M4722 Other spondylosis with radiculopathy, cervical region: Secondary | ICD-10-CM | POA: Diagnosis not present

## 2015-06-05 DIAGNOSIS — R296 Repeated falls: Secondary | ICD-10-CM | POA: Diagnosis not present

## 2015-06-05 DIAGNOSIS — I4510 Unspecified right bundle-branch block: Secondary | ICD-10-CM | POA: Diagnosis not present

## 2015-06-05 DIAGNOSIS — M4726 Other spondylosis with radiculopathy, lumbar region: Secondary | ICD-10-CM | POA: Diagnosis not present

## 2015-06-05 DIAGNOSIS — I69354 Hemiplegia and hemiparesis following cerebral infarction affecting left non-dominant side: Secondary | ICD-10-CM | POA: Diagnosis not present

## 2015-06-06 ENCOUNTER — Ambulatory Visit (INDEPENDENT_AMBULATORY_CARE_PROVIDER_SITE_OTHER): Payer: Medicare Other | Admitting: Internal Medicine

## 2015-06-06 ENCOUNTER — Encounter: Payer: Self-pay | Admitting: Internal Medicine

## 2015-06-06 VITALS — BP 140/72 | HR 60 | Ht 64.0 in | Wt 140.0 lb

## 2015-06-06 DIAGNOSIS — I480 Paroxysmal atrial fibrillation: Secondary | ICD-10-CM | POA: Diagnosis not present

## 2015-06-06 DIAGNOSIS — M4726 Other spondylosis with radiculopathy, lumbar region: Secondary | ICD-10-CM | POA: Diagnosis not present

## 2015-06-06 DIAGNOSIS — I4510 Unspecified right bundle-branch block: Secondary | ICD-10-CM | POA: Diagnosis not present

## 2015-06-06 DIAGNOSIS — I69354 Hemiplegia and hemiparesis following cerebral infarction affecting left non-dominant side: Secondary | ICD-10-CM | POA: Diagnosis not present

## 2015-06-06 DIAGNOSIS — I639 Cerebral infarction, unspecified: Secondary | ICD-10-CM

## 2015-06-06 DIAGNOSIS — I4891 Unspecified atrial fibrillation: Secondary | ICD-10-CM | POA: Insufficient documentation

## 2015-06-06 DIAGNOSIS — E785 Hyperlipidemia, unspecified: Secondary | ICD-10-CM | POA: Diagnosis not present

## 2015-06-06 DIAGNOSIS — R296 Repeated falls: Secondary | ICD-10-CM | POA: Diagnosis not present

## 2015-06-06 DIAGNOSIS — M4722 Other spondylosis with radiculopathy, cervical region: Secondary | ICD-10-CM | POA: Diagnosis not present

## 2015-06-06 MED ORDER — APIXABAN 5 MG PO TABS: 5.0000 mg | ORAL_TABLET | Freq: Two times a day (BID) | ORAL | Status: DC

## 2015-06-06 NOTE — Assessment & Plan Note (Signed)
She will continue Pravachol. Will follow.

## 2015-06-06 NOTE — Assessment & Plan Note (Signed)
Subsequent cardiac monitoring has now demonstrated PAF. She is tolerating Eliquis. Will follow.

## 2015-06-06 NOTE — Progress Notes (Signed)
HPI Ms. Wiegand returns today for followup. She is a pleasant 81 yo woman with multiple medical problems who sustained a stroke, had an ILR inserted and then on cardiac monitoring developed atrial fib with a CVR/RVR for which she was asymptomatic. In the interim, she has been stable. No syncope. She has tolerated her Eliquis. No other complaints today. She has residual weakness in her left leg, and less so in the left hand.  Allergies  Allergen Reactions  . Ace Inhibitors Other (See Comments)    Severe fatigue  . Lipitor [Atorvastatin] Other (See Comments)    Mood swings  . Motrin [Ibuprofen] Nausea And Vomiting     Current Outpatient Prescriptions  Medication Sig Dispense Refill  . apixaban (ELIQUIS) 5 MG TABS tablet Take 1 tablet (5 mg total) by mouth 2 (two) times daily. 60 tablet 12  . baclofen (LIORESAL) 10 MG tablet Take 1 tablet (10 mg total) by mouth 3 (three) times daily. And 20 mg at bedtime (Patient taking differently: Take 10 mg by mouth every 6 (six) hours. ) 120 each 3  . benzonatate (TESSALON) 100 MG capsule Take 1 capsule (100 mg total) by mouth 3 (three) times daily as needed for cough. 20 capsule 0  . dorzolamide-timolol (COSOPT) 22.3-6.8 MG/ML ophthalmic solution Place 1 drop into both eyes 2 (two) times daily.    Marland Kitchen. gabapentin (NEURONTIN) 300 MG capsule Take 300 mg by mouth at bedtime.     Marland Kitchen. latanoprost (XALATAN) 0.005 % ophthalmic solution Place 1 drop into both eyes at bedtime.    Marland Kitchen. levothyroxine (SYNTHROID, LEVOTHROID) 88 MCG tablet Take 88 mcg by mouth daily before breakfast.    . lidocaine (LIDODERM) 5 % Place 1 patch onto the skin daily. Remove & Discard patch within 12 hours or as directed by MD    . loratadine (CLARITIN) 10 MG tablet Take 10 mg by mouth daily.    . metoprolol tartrate (LOPRESSOR) 25 MG tablet Take 25 mg by mouth 2 (two) times daily.    . mirtazapine (REMERON) 7.5 MG tablet Take 7.5 mg by mouth at bedtime.    . pantoprazole (PROTONIX) 40 MG  tablet Take 40 mg by mouth 2 (two) times daily.    . polyethylene glycol powder (GLYCOLAX/MIRALAX) powder Take 17 g by mouth daily. 3350 g 1  . pravastatin (PRAVACHOL) 40 MG tablet Take 1 tablet (40 mg total) by mouth daily at 6 PM. 30 tablet 2  . senna-docusate (SENOKOT-S) 8.6-50 MG per tablet Take 2 tablets by mouth daily. 30 tablet 6  . sodium chloride (OCEAN) 0.65 % SOLN nasal spray Place 1 spray into both nostrils as needed for congestion.    . tamsulosin (FLOMAX) 0.4 MG CAPS capsule Take 0.4 mg by mouth daily.    . traMADol (ULTRAM) 50 MG tablet Take 1/2 tablet by mouth every 4 hours as needed for mild pain; Take one tablet by mouth every 4 hours as needed for moderate to severe pain 180 tablet 5  . traZODone (DESYREL) 50 MG tablet Take 0.5 tablets (25 mg total) by mouth at bedtime. 5 tablet 0   No current facility-administered medications for this visit.     Past Medical History  Diagnosis Date  . Hypothyroidism   . Arthritis   . GERD (gastroesophageal reflux disease)   . Dysphagia   . Hyperlipidemia   . Right bundle branch block 03/21/2014  . HTN (hypertension) 10/30/2012  . Unspecified hypothyroidism 10/30/2012  . Severe hearing loss   .  DJD (degenerative joint disease), lumbar   . Rotoscoliosis   . Glaucoma   . DJD (degenerative joint disease), cervical   . Chronic neck pain   . Nocturia   . Insomnia   . Stroke (HCC)   . Shortness of breath dyspnea     ROS:   All systems reviewed and negative except as noted in the HPI.   Past Surgical History  Procedure Laterality Date  . Breast surgery    . Back surgery      lower back   . Esophagogastroduodenoscopy (egd) with esophageal dilation  06/08/2012    Procedure: ESOPHAGOGASTRODUODENOSCOPY (EGD) WITH ESOPHAGEAL DILATION;  Surgeon: Charolett Bumpers, MD;  Location: [facility]COPY;  Service: Endoscopy;  Laterality: N/A;  . Bladder laser surgery    . Left carpal tunnel surgery    . Facail surgery  2000    facelift and  eyelid lift  . Cataract surgery    . Lumbar laminectomy/decompression microdiscectomy N/A 01/17/2015    Procedure: Laminectomy and Foraminotomy - L2-L3 - L3-L4;  Surgeon: [name], MD;  Location: [facility];  Service: Neurosurgery;  Laterality: N/A;  Laminectomy and Foraminotomy - L2-L3 - L3-L4  . Ep implantable device N/A 01/19/2015    Procedure: Loop Recorder Insertion;  Surgeon: Marinus Maw, MD;  Location: [facility] INVASIVE CV LAB;  Service: Cardiovascular;  Laterality: N/A;  . Tee without cardioversion N/A 01/19/2015    Procedure: TRANSESOPHAGEAL ECHOCARDIOGRAM (TEE);  Surgeon: Chrystie Nose, MD;  Location: [facility] ENDOSCOPY;  Service: Cardiovascular;  Laterality: N/A;     Family History  Problem Relation Age of Onset  . CAD Father   . Stroke Father   . CAD Brother   . Diabetes Mother   . Lung cancer Other      Social History   Social History  . Marital Status: Married    Spouse Name: widowed now  . Number of Children: N/A  . Years of Education: 10   Occupational History  . retired     Veterinary surgeon, office work   Social History Main Topics  . Smoking status: Never Smoker   . Smokeless tobacco: Not on file  . Alcohol Use: No  . Drug Use: No  . Sexual Activity: No   Other Topics Concern  . Not on file   Social History Narrative   Widowed, 2 sons   Right handed   caffeine use- none     BP 140/72 mmHg  Pulse 60  Ht  (1.626 m)  Wt 140 lb (63.504 kg)  BMI 24.02 kg/m2  Physical Exam:  Well appearing 81 yo man, NAD HEENT: Unremarkable Neck:  6 cm JVD, no thyromegally Lymphatics:  No adenopathy Back:  No CVA tenderness Lungs:  Clear with no wheezes HEART:  Regular rate rhythm, no murmurs, no rubs, no clicks Abd:  soft, positive bowel sounds, no organomegally, no rebound, no guarding Ext:  2 plus pulses, no edema, no cyanosis, no clubbing Skin:  No rashes no nodules Neuro:  CN II through XII intact, motor grossly intact  DEVICE  Normal device function.     Assess/Plan:

## 2015-06-06 NOTE — Patient Instructions (Signed)
Medication Instructions:  Your physician recommends that you continue on your current medications as directed. Please refer to the Current Medication list given to you today.   Labwork: None ordered   Testing/Procedures: None ordered   Follow-Up: Your physician wants you to follow-up in: 12 months with Dr Taylor You will receive a reminder letter in the mail two months in advance. If you don't receive a letter, please call our office to schedule the follow-up appointment.   Remote monitoring is used to monitor your Pacemaker  from home. This monitoring reduces the number of office visits required to check your device to one time per year. It allows us to keep an eye on the functioning of your device to ensure it is working properly. You are scheduled for a device check from home on 09/05/15. You may send your transmission at any time that day. If you have a wireless device, the transmission will be sent automatically. After your physician reviews your transmission, you will receive a postcard with your next transmission date.    Any Other Special Instructions Will Be Listed Below (If Applicable).     If you need a refill on your cardiac medications before your next appointment, please call your pharmacy.   

## 2015-06-06 NOTE — Assessment & Plan Note (Signed)
She has PAF less than 1% of the time. Will follow.

## 2015-06-07 DIAGNOSIS — I4510 Unspecified right bundle-branch block: Secondary | ICD-10-CM | POA: Diagnosis not present

## 2015-06-07 DIAGNOSIS — M4722 Other spondylosis with radiculopathy, cervical region: Secondary | ICD-10-CM | POA: Diagnosis not present

## 2015-06-07 DIAGNOSIS — I480 Paroxysmal atrial fibrillation: Secondary | ICD-10-CM | POA: Diagnosis not present

## 2015-06-07 DIAGNOSIS — M4726 Other spondylosis with radiculopathy, lumbar region: Secondary | ICD-10-CM | POA: Diagnosis not present

## 2015-06-07 DIAGNOSIS — I69354 Hemiplegia and hemiparesis following cerebral infarction affecting left non-dominant side: Secondary | ICD-10-CM | POA: Diagnosis not present

## 2015-06-07 DIAGNOSIS — R296 Repeated falls: Secondary | ICD-10-CM | POA: Diagnosis not present

## 2015-06-08 DIAGNOSIS — M4726 Other spondylosis with radiculopathy, lumbar region: Secondary | ICD-10-CM | POA: Diagnosis not present

## 2015-06-08 DIAGNOSIS — M4722 Other spondylosis with radiculopathy, cervical region: Secondary | ICD-10-CM | POA: Diagnosis not present

## 2015-06-08 DIAGNOSIS — I69354 Hemiplegia and hemiparesis following cerebral infarction affecting left non-dominant side: Secondary | ICD-10-CM | POA: Diagnosis not present

## 2015-06-08 DIAGNOSIS — I4510 Unspecified right bundle-branch block: Secondary | ICD-10-CM | POA: Diagnosis not present

## 2015-06-08 DIAGNOSIS — R296 Repeated falls: Secondary | ICD-10-CM | POA: Diagnosis not present

## 2015-06-08 DIAGNOSIS — I480 Paroxysmal atrial fibrillation: Secondary | ICD-10-CM | POA: Diagnosis not present

## 2015-06-09 DIAGNOSIS — M4722 Other spondylosis with radiculopathy, cervical region: Secondary | ICD-10-CM | POA: Diagnosis not present

## 2015-06-09 DIAGNOSIS — M4726 Other spondylosis with radiculopathy, lumbar region: Secondary | ICD-10-CM | POA: Diagnosis not present

## 2015-06-09 DIAGNOSIS — I480 Paroxysmal atrial fibrillation: Secondary | ICD-10-CM | POA: Diagnosis not present

## 2015-06-09 DIAGNOSIS — I4510 Unspecified right bundle-branch block: Secondary | ICD-10-CM | POA: Diagnosis not present

## 2015-06-09 DIAGNOSIS — I69354 Hemiplegia and hemiparesis following cerebral infarction affecting left non-dominant side: Secondary | ICD-10-CM | POA: Diagnosis not present

## 2015-06-09 DIAGNOSIS — R296 Repeated falls: Secondary | ICD-10-CM | POA: Diagnosis not present

## 2015-06-12 DIAGNOSIS — M4726 Other spondylosis with radiculopathy, lumbar region: Secondary | ICD-10-CM | POA: Diagnosis not present

## 2015-06-12 DIAGNOSIS — I4510 Unspecified right bundle-branch block: Secondary | ICD-10-CM | POA: Diagnosis not present

## 2015-06-12 DIAGNOSIS — I69354 Hemiplegia and hemiparesis following cerebral infarction affecting left non-dominant side: Secondary | ICD-10-CM | POA: Diagnosis not present

## 2015-06-12 DIAGNOSIS — I480 Paroxysmal atrial fibrillation: Secondary | ICD-10-CM | POA: Diagnosis not present

## 2015-06-12 DIAGNOSIS — M4722 Other spondylosis with radiculopathy, cervical region: Secondary | ICD-10-CM | POA: Diagnosis not present

## 2015-06-12 DIAGNOSIS — R296 Repeated falls: Secondary | ICD-10-CM | POA: Diagnosis not present

## 2015-06-13 DIAGNOSIS — M4722 Other spondylosis with radiculopathy, cervical region: Secondary | ICD-10-CM | POA: Diagnosis not present

## 2015-06-13 DIAGNOSIS — R296 Repeated falls: Secondary | ICD-10-CM | POA: Diagnosis not present

## 2015-06-13 DIAGNOSIS — I480 Paroxysmal atrial fibrillation: Secondary | ICD-10-CM | POA: Diagnosis not present

## 2015-06-13 DIAGNOSIS — M4726 Other spondylosis with radiculopathy, lumbar region: Secondary | ICD-10-CM | POA: Diagnosis not present

## 2015-06-13 DIAGNOSIS — I4510 Unspecified right bundle-branch block: Secondary | ICD-10-CM | POA: Diagnosis not present

## 2015-06-13 DIAGNOSIS — I69354 Hemiplegia and hemiparesis following cerebral infarction affecting left non-dominant side: Secondary | ICD-10-CM | POA: Diagnosis not present

## 2015-06-14 DIAGNOSIS — M4726 Other spondylosis with radiculopathy, lumbar region: Secondary | ICD-10-CM | POA: Diagnosis not present

## 2015-06-14 DIAGNOSIS — I480 Paroxysmal atrial fibrillation: Secondary | ICD-10-CM | POA: Diagnosis not present

## 2015-06-14 DIAGNOSIS — R296 Repeated falls: Secondary | ICD-10-CM | POA: Diagnosis not present

## 2015-06-14 DIAGNOSIS — M4722 Other spondylosis with radiculopathy, cervical region: Secondary | ICD-10-CM | POA: Diagnosis not present

## 2015-06-14 DIAGNOSIS — I4510 Unspecified right bundle-branch block: Secondary | ICD-10-CM | POA: Diagnosis not present

## 2015-06-14 DIAGNOSIS — I69354 Hemiplegia and hemiparesis following cerebral infarction affecting left non-dominant side: Secondary | ICD-10-CM | POA: Diagnosis not present

## 2015-06-15 DIAGNOSIS — M4726 Other spondylosis with radiculopathy, lumbar region: Secondary | ICD-10-CM | POA: Diagnosis not present

## 2015-06-15 DIAGNOSIS — R296 Repeated falls: Secondary | ICD-10-CM | POA: Diagnosis not present

## 2015-06-15 DIAGNOSIS — I480 Paroxysmal atrial fibrillation: Secondary | ICD-10-CM | POA: Diagnosis not present

## 2015-06-15 DIAGNOSIS — I69354 Hemiplegia and hemiparesis following cerebral infarction affecting left non-dominant side: Secondary | ICD-10-CM | POA: Diagnosis not present

## 2015-06-15 DIAGNOSIS — M4722 Other spondylosis with radiculopathy, cervical region: Secondary | ICD-10-CM | POA: Diagnosis not present

## 2015-06-15 DIAGNOSIS — I4510 Unspecified right bundle-branch block: Secondary | ICD-10-CM | POA: Diagnosis not present

## 2015-06-16 LAB — CUP PACEART REMOTE DEVICE CHECK: MDC IDC SESS DTM: 20161111193740

## 2015-06-16 NOTE — Progress Notes (Signed)
Carelink summary report received. Battery status OK. Normal device function. No new symptom episodes, tachy episodes, brady, or pause episodes. 1 AF episode (burden 0.0%), +Eliquis, avg V rate controlled. Monthly summary reports and ROV with GT in 05/2016.

## 2015-06-19 ENCOUNTER — Ambulatory Visit (INDEPENDENT_AMBULATORY_CARE_PROVIDER_SITE_OTHER): Payer: Medicare Other | Admitting: *Deleted

## 2015-06-19 DIAGNOSIS — H401132 Primary open-angle glaucoma, bilateral, moderate stage: Secondary | ICD-10-CM | POA: Diagnosis not present

## 2015-06-19 DIAGNOSIS — I639 Cerebral infarction, unspecified: Secondary | ICD-10-CM

## 2015-06-19 DIAGNOSIS — R296 Repeated falls: Secondary | ICD-10-CM | POA: Diagnosis not present

## 2015-06-19 DIAGNOSIS — I69354 Hemiplegia and hemiparesis following cerebral infarction affecting left non-dominant side: Secondary | ICD-10-CM | POA: Diagnosis not present

## 2015-06-19 DIAGNOSIS — M4726 Other spondylosis with radiculopathy, lumbar region: Secondary | ICD-10-CM | POA: Diagnosis not present

## 2015-06-19 DIAGNOSIS — M4722 Other spondylosis with radiculopathy, cervical region: Secondary | ICD-10-CM | POA: Diagnosis not present

## 2015-06-19 DIAGNOSIS — I4510 Unspecified right bundle-branch block: Secondary | ICD-10-CM | POA: Diagnosis not present

## 2015-06-19 DIAGNOSIS — I480 Paroxysmal atrial fibrillation: Secondary | ICD-10-CM | POA: Diagnosis not present

## 2015-06-19 NOTE — Progress Notes (Signed)
Carelink Summary Report / Loop Recorder 

## 2015-06-20 DIAGNOSIS — I480 Paroxysmal atrial fibrillation: Secondary | ICD-10-CM | POA: Diagnosis not present

## 2015-06-20 DIAGNOSIS — I69354 Hemiplegia and hemiparesis following cerebral infarction affecting left non-dominant side: Secondary | ICD-10-CM | POA: Diagnosis not present

## 2015-06-20 DIAGNOSIS — M4726 Other spondylosis with radiculopathy, lumbar region: Secondary | ICD-10-CM | POA: Diagnosis not present

## 2015-06-20 DIAGNOSIS — R296 Repeated falls: Secondary | ICD-10-CM | POA: Diagnosis not present

## 2015-06-20 DIAGNOSIS — I4510 Unspecified right bundle-branch block: Secondary | ICD-10-CM | POA: Diagnosis not present

## 2015-06-20 DIAGNOSIS — M4722 Other spondylosis with radiculopathy, cervical region: Secondary | ICD-10-CM | POA: Diagnosis not present

## 2015-06-21 DIAGNOSIS — M4722 Other spondylosis with radiculopathy, cervical region: Secondary | ICD-10-CM | POA: Diagnosis not present

## 2015-06-21 DIAGNOSIS — R296 Repeated falls: Secondary | ICD-10-CM | POA: Diagnosis not present

## 2015-06-21 DIAGNOSIS — I69354 Hemiplegia and hemiparesis following cerebral infarction affecting left non-dominant side: Secondary | ICD-10-CM | POA: Diagnosis not present

## 2015-06-21 DIAGNOSIS — I480 Paroxysmal atrial fibrillation: Secondary | ICD-10-CM | POA: Diagnosis not present

## 2015-06-21 DIAGNOSIS — I4510 Unspecified right bundle-branch block: Secondary | ICD-10-CM | POA: Diagnosis not present

## 2015-06-21 DIAGNOSIS — M4726 Other spondylosis with radiculopathy, lumbar region: Secondary | ICD-10-CM | POA: Diagnosis not present

## 2015-06-26 DIAGNOSIS — R296 Repeated falls: Secondary | ICD-10-CM | POA: Diagnosis not present

## 2015-06-26 DIAGNOSIS — M4722 Other spondylosis with radiculopathy, cervical region: Secondary | ICD-10-CM | POA: Diagnosis not present

## 2015-06-26 DIAGNOSIS — I69354 Hemiplegia and hemiparesis following cerebral infarction affecting left non-dominant side: Secondary | ICD-10-CM | POA: Diagnosis not present

## 2015-06-26 DIAGNOSIS — M4726 Other spondylosis with radiculopathy, lumbar region: Secondary | ICD-10-CM | POA: Diagnosis not present

## 2015-06-26 DIAGNOSIS — I480 Paroxysmal atrial fibrillation: Secondary | ICD-10-CM | POA: Diagnosis not present

## 2015-06-26 DIAGNOSIS — I4510 Unspecified right bundle-branch block: Secondary | ICD-10-CM | POA: Diagnosis not present

## 2015-06-27 DIAGNOSIS — M4726 Other spondylosis with radiculopathy, lumbar region: Secondary | ICD-10-CM | POA: Diagnosis not present

## 2015-06-27 DIAGNOSIS — I480 Paroxysmal atrial fibrillation: Secondary | ICD-10-CM | POA: Diagnosis not present

## 2015-06-27 DIAGNOSIS — I69354 Hemiplegia and hemiparesis following cerebral infarction affecting left non-dominant side: Secondary | ICD-10-CM | POA: Diagnosis not present

## 2015-06-27 DIAGNOSIS — R296 Repeated falls: Secondary | ICD-10-CM | POA: Diagnosis not present

## 2015-06-27 DIAGNOSIS — I4510 Unspecified right bundle-branch block: Secondary | ICD-10-CM | POA: Diagnosis not present

## 2015-06-27 DIAGNOSIS — M4722 Other spondylosis with radiculopathy, cervical region: Secondary | ICD-10-CM | POA: Diagnosis not present

## 2015-06-28 DIAGNOSIS — R296 Repeated falls: Secondary | ICD-10-CM | POA: Diagnosis not present

## 2015-06-28 DIAGNOSIS — M4726 Other spondylosis with radiculopathy, lumbar region: Secondary | ICD-10-CM | POA: Diagnosis not present

## 2015-06-28 DIAGNOSIS — I4510 Unspecified right bundle-branch block: Secondary | ICD-10-CM | POA: Diagnosis not present

## 2015-06-28 DIAGNOSIS — I480 Paroxysmal atrial fibrillation: Secondary | ICD-10-CM | POA: Diagnosis not present

## 2015-06-28 DIAGNOSIS — I69354 Hemiplegia and hemiparesis following cerebral infarction affecting left non-dominant side: Secondary | ICD-10-CM | POA: Diagnosis not present

## 2015-06-28 DIAGNOSIS — M4722 Other spondylosis with radiculopathy, cervical region: Secondary | ICD-10-CM | POA: Diagnosis not present

## 2015-06-29 DIAGNOSIS — I4510 Unspecified right bundle-branch block: Secondary | ICD-10-CM | POA: Diagnosis not present

## 2015-06-29 DIAGNOSIS — I480 Paroxysmal atrial fibrillation: Secondary | ICD-10-CM | POA: Diagnosis not present

## 2015-06-29 DIAGNOSIS — M4722 Other spondylosis with radiculopathy, cervical region: Secondary | ICD-10-CM | POA: Diagnosis not present

## 2015-06-29 DIAGNOSIS — R296 Repeated falls: Secondary | ICD-10-CM | POA: Diagnosis not present

## 2015-06-29 DIAGNOSIS — M4726 Other spondylosis with radiculopathy, lumbar region: Secondary | ICD-10-CM | POA: Diagnosis not present

## 2015-06-29 DIAGNOSIS — I69354 Hemiplegia and hemiparesis following cerebral infarction affecting left non-dominant side: Secondary | ICD-10-CM | POA: Diagnosis not present

## 2015-06-29 LAB — CUP PACEART INCLINIC DEVICE CHECK: MDC IDC SESS DTM: 20161129174450

## 2015-07-13 ENCOUNTER — Ambulatory Visit: Payer: Medicare Other | Admitting: Neurology

## 2015-07-18 ENCOUNTER — Ambulatory Visit (INDEPENDENT_AMBULATORY_CARE_PROVIDER_SITE_OTHER): Payer: Medicare Other | Admitting: *Deleted

## 2015-07-18 DIAGNOSIS — I639 Cerebral infarction, unspecified: Secondary | ICD-10-CM

## 2015-07-18 LAB — CUP PACEART REMOTE DEVICE CHECK: MDC IDC SESS DTM: 20170110200600

## 2015-07-19 NOTE — Progress Notes (Signed)
Carelink Summary Report / Loop Recorder 

## 2015-08-09 LAB — CUP PACEART REMOTE DEVICE CHECK: Date Time Interrogation Session: 20161211200559

## 2015-08-17 ENCOUNTER — Ambulatory Visit (INDEPENDENT_AMBULATORY_CARE_PROVIDER_SITE_OTHER): Payer: Medicare Other | Admitting: *Deleted

## 2015-08-17 DIAGNOSIS — I639 Cerebral infarction, unspecified: Secondary | ICD-10-CM

## 2015-08-18 NOTE — Progress Notes (Signed)
Carelink Summary Report / Loop Recorder 

## 2015-08-21 DIAGNOSIS — Z0001 Encounter for general adult medical examination with abnormal findings: Secondary | ICD-10-CM | POA: Diagnosis not present

## 2015-08-21 DIAGNOSIS — M4722 Other spondylosis with radiculopathy, cervical region: Secondary | ICD-10-CM | POA: Diagnosis not present

## 2015-08-21 DIAGNOSIS — G4700 Insomnia, unspecified: Secondary | ICD-10-CM | POA: Diagnosis not present

## 2015-08-21 DIAGNOSIS — G890 Central pain syndrome: Secondary | ICD-10-CM | POA: Diagnosis not present

## 2015-08-21 DIAGNOSIS — I639 Cerebral infarction, unspecified: Secondary | ICD-10-CM | POA: Diagnosis not present

## 2015-08-21 DIAGNOSIS — F329 Major depressive disorder, single episode, unspecified: Secondary | ICD-10-CM | POA: Diagnosis not present

## 2015-08-21 DIAGNOSIS — I480 Paroxysmal atrial fibrillation: Secondary | ICD-10-CM | POA: Diagnosis not present

## 2015-08-21 DIAGNOSIS — I10 Essential (primary) hypertension: Secondary | ICD-10-CM | POA: Diagnosis not present

## 2015-08-21 DIAGNOSIS — R296 Repeated falls: Secondary | ICD-10-CM | POA: Diagnosis not present

## 2015-08-21 DIAGNOSIS — I4510 Unspecified right bundle-branch block: Secondary | ICD-10-CM | POA: Diagnosis not present

## 2015-08-21 DIAGNOSIS — Z1389 Encounter for screening for other disorder: Secondary | ICD-10-CM | POA: Diagnosis not present

## 2015-08-21 DIAGNOSIS — Z79899 Other long term (current) drug therapy: Secondary | ICD-10-CM | POA: Diagnosis not present

## 2015-09-04 ENCOUNTER — Other Ambulatory Visit: Payer: Self-pay | Admitting: Nurse Practitioner

## 2015-09-06 NOTE — Progress Notes (Signed)
Carelink summary report received. Battery status OK. Normal device function. No new symptom episodes, brady, or pause episodes.2 tachy slightly irreg. VS noted. 9 AF 0.1% (known PAF) +Eliquis. No symptoms reported. Monthly summary reports and ROV/PRN

## 2015-09-11 ENCOUNTER — Other Ambulatory Visit: Payer: Self-pay | Admitting: Nurse Practitioner

## 2015-09-18 ENCOUNTER — Ambulatory Visit (INDEPENDENT_AMBULATORY_CARE_PROVIDER_SITE_OTHER): Payer: Medicare Other | Admitting: *Deleted

## 2015-09-18 DIAGNOSIS — I639 Cerebral infarction, unspecified: Secondary | ICD-10-CM | POA: Diagnosis not present

## 2015-09-18 NOTE — Progress Notes (Signed)
Carelink Summary Report / Loop Recorder 

## 2015-10-16 ENCOUNTER — Ambulatory Visit (INDEPENDENT_AMBULATORY_CARE_PROVIDER_SITE_OTHER): Payer: Medicare Other | Admitting: *Deleted

## 2015-10-16 DIAGNOSIS — I639 Cerebral infarction, unspecified: Secondary | ICD-10-CM

## 2015-10-17 NOTE — Progress Notes (Signed)
Carelink Summary Report / Loop Recorder 

## 2015-10-18 DIAGNOSIS — I10 Essential (primary) hypertension: Secondary | ICD-10-CM | POA: Diagnosis not present

## 2015-10-18 DIAGNOSIS — M4722 Other spondylosis with radiculopathy, cervical region: Secondary | ICD-10-CM | POA: Diagnosis not present

## 2015-10-18 DIAGNOSIS — M5416 Radiculopathy, lumbar region: Secondary | ICD-10-CM | POA: Diagnosis not present

## 2015-10-18 DIAGNOSIS — G890 Central pain syndrome: Secondary | ICD-10-CM | POA: Diagnosis not present

## 2015-10-18 DIAGNOSIS — I69354 Hemiplegia and hemiparesis following cerebral infarction affecting left non-dominant side: Secondary | ICD-10-CM | POA: Diagnosis not present

## 2015-10-18 DIAGNOSIS — M4726 Other spondylosis with radiculopathy, lumbar region: Secondary | ICD-10-CM | POA: Diagnosis not present

## 2015-10-25 DIAGNOSIS — I69354 Hemiplegia and hemiparesis following cerebral infarction affecting left non-dominant side: Secondary | ICD-10-CM | POA: Diagnosis not present

## 2015-10-25 DIAGNOSIS — M4726 Other spondylosis with radiculopathy, lumbar region: Secondary | ICD-10-CM | POA: Diagnosis not present

## 2015-10-25 DIAGNOSIS — M4722 Other spondylosis with radiculopathy, cervical region: Secondary | ICD-10-CM | POA: Diagnosis not present

## 2015-10-25 DIAGNOSIS — M5416 Radiculopathy, lumbar region: Secondary | ICD-10-CM | POA: Diagnosis not present

## 2015-10-25 DIAGNOSIS — I10 Essential (primary) hypertension: Secondary | ICD-10-CM | POA: Diagnosis not present

## 2015-10-25 DIAGNOSIS — G890 Central pain syndrome: Secondary | ICD-10-CM | POA: Diagnosis not present

## 2015-10-27 DIAGNOSIS — G890 Central pain syndrome: Secondary | ICD-10-CM | POA: Diagnosis not present

## 2015-10-27 DIAGNOSIS — M4722 Other spondylosis with radiculopathy, cervical region: Secondary | ICD-10-CM | POA: Diagnosis not present

## 2015-10-27 DIAGNOSIS — I10 Essential (primary) hypertension: Secondary | ICD-10-CM | POA: Diagnosis not present

## 2015-10-27 DIAGNOSIS — M4726 Other spondylosis with radiculopathy, lumbar region: Secondary | ICD-10-CM | POA: Diagnosis not present

## 2015-10-27 DIAGNOSIS — M5416 Radiculopathy, lumbar region: Secondary | ICD-10-CM | POA: Diagnosis not present

## 2015-10-27 DIAGNOSIS — I69354 Hemiplegia and hemiparesis following cerebral infarction affecting left non-dominant side: Secondary | ICD-10-CM | POA: Diagnosis not present

## 2015-10-28 LAB — CUP PACEART REMOTE DEVICE CHECK: MDC IDC SESS DTM: 20170209203604

## 2015-10-28 NOTE — Progress Notes (Signed)
Carelink summary report received. Battery status OK. Normal device function. No new symptom episodes, brady, or pause episodes. 1 tachy-noise artifact noted on ECG. 3 AF episodes 0% ECGs appear AF, RVR at times, occasional vent. undersensing. Monthly summary reports and ROV/PRN

## 2015-10-30 DIAGNOSIS — M5416 Radiculopathy, lumbar region: Secondary | ICD-10-CM | POA: Diagnosis not present

## 2015-10-30 DIAGNOSIS — M4722 Other spondylosis with radiculopathy, cervical region: Secondary | ICD-10-CM | POA: Diagnosis not present

## 2015-10-30 DIAGNOSIS — M4726 Other spondylosis with radiculopathy, lumbar region: Secondary | ICD-10-CM | POA: Diagnosis not present

## 2015-10-30 DIAGNOSIS — I10 Essential (primary) hypertension: Secondary | ICD-10-CM | POA: Diagnosis not present

## 2015-10-30 DIAGNOSIS — I69354 Hemiplegia and hemiparesis following cerebral infarction affecting left non-dominant side: Secondary | ICD-10-CM | POA: Diagnosis not present

## 2015-10-30 DIAGNOSIS — G890 Central pain syndrome: Secondary | ICD-10-CM | POA: Diagnosis not present

## 2015-11-01 DIAGNOSIS — I69354 Hemiplegia and hemiparesis following cerebral infarction affecting left non-dominant side: Secondary | ICD-10-CM | POA: Diagnosis not present

## 2015-11-01 DIAGNOSIS — G890 Central pain syndrome: Secondary | ICD-10-CM | POA: Diagnosis not present

## 2015-11-01 DIAGNOSIS — M5416 Radiculopathy, lumbar region: Secondary | ICD-10-CM | POA: Diagnosis not present

## 2015-11-01 DIAGNOSIS — I10 Essential (primary) hypertension: Secondary | ICD-10-CM | POA: Diagnosis not present

## 2015-11-01 DIAGNOSIS — M4726 Other spondylosis with radiculopathy, lumbar region: Secondary | ICD-10-CM | POA: Diagnosis not present

## 2015-11-01 DIAGNOSIS — M4722 Other spondylosis with radiculopathy, cervical region: Secondary | ICD-10-CM | POA: Diagnosis not present

## 2015-11-02 DIAGNOSIS — G890 Central pain syndrome: Secondary | ICD-10-CM | POA: Diagnosis not present

## 2015-11-02 DIAGNOSIS — I69354 Hemiplegia and hemiparesis following cerebral infarction affecting left non-dominant side: Secondary | ICD-10-CM | POA: Diagnosis not present

## 2015-11-02 DIAGNOSIS — M5416 Radiculopathy, lumbar region: Secondary | ICD-10-CM | POA: Diagnosis not present

## 2015-11-02 DIAGNOSIS — M4726 Other spondylosis with radiculopathy, lumbar region: Secondary | ICD-10-CM | POA: Diagnosis not present

## 2015-11-02 DIAGNOSIS — M4722 Other spondylosis with radiculopathy, cervical region: Secondary | ICD-10-CM | POA: Diagnosis not present

## 2015-11-02 DIAGNOSIS — I10 Essential (primary) hypertension: Secondary | ICD-10-CM | POA: Diagnosis not present

## 2015-11-06 DIAGNOSIS — M5416 Radiculopathy, lumbar region: Secondary | ICD-10-CM | POA: Diagnosis not present

## 2015-11-06 DIAGNOSIS — G890 Central pain syndrome: Secondary | ICD-10-CM | POA: Diagnosis not present

## 2015-11-06 DIAGNOSIS — I10 Essential (primary) hypertension: Secondary | ICD-10-CM | POA: Diagnosis not present

## 2015-11-06 DIAGNOSIS — M4726 Other spondylosis with radiculopathy, lumbar region: Secondary | ICD-10-CM | POA: Diagnosis not present

## 2015-11-06 DIAGNOSIS — M4722 Other spondylosis with radiculopathy, cervical region: Secondary | ICD-10-CM | POA: Diagnosis not present

## 2015-11-06 DIAGNOSIS — I69354 Hemiplegia and hemiparesis following cerebral infarction affecting left non-dominant side: Secondary | ICD-10-CM | POA: Diagnosis not present

## 2015-11-07 DIAGNOSIS — G890 Central pain syndrome: Secondary | ICD-10-CM | POA: Diagnosis not present

## 2015-11-07 DIAGNOSIS — I69354 Hemiplegia and hemiparesis following cerebral infarction affecting left non-dominant side: Secondary | ICD-10-CM | POA: Diagnosis not present

## 2015-11-07 DIAGNOSIS — M4726 Other spondylosis with radiculopathy, lumbar region: Secondary | ICD-10-CM | POA: Diagnosis not present

## 2015-11-07 DIAGNOSIS — I10 Essential (primary) hypertension: Secondary | ICD-10-CM | POA: Diagnosis not present

## 2015-11-07 DIAGNOSIS — M5416 Radiculopathy, lumbar region: Secondary | ICD-10-CM | POA: Diagnosis not present

## 2015-11-07 DIAGNOSIS — M4722 Other spondylosis with radiculopathy, cervical region: Secondary | ICD-10-CM | POA: Diagnosis not present

## 2015-11-08 DIAGNOSIS — I10 Essential (primary) hypertension: Secondary | ICD-10-CM | POA: Diagnosis not present

## 2015-11-08 DIAGNOSIS — M5416 Radiculopathy, lumbar region: Secondary | ICD-10-CM | POA: Diagnosis not present

## 2015-11-08 DIAGNOSIS — I69354 Hemiplegia and hemiparesis following cerebral infarction affecting left non-dominant side: Secondary | ICD-10-CM | POA: Diagnosis not present

## 2015-11-08 DIAGNOSIS — M4722 Other spondylosis with radiculopathy, cervical region: Secondary | ICD-10-CM | POA: Diagnosis not present

## 2015-11-08 DIAGNOSIS — M4726 Other spondylosis with radiculopathy, lumbar region: Secondary | ICD-10-CM | POA: Diagnosis not present

## 2015-11-08 DIAGNOSIS — G890 Central pain syndrome: Secondary | ICD-10-CM | POA: Diagnosis not present

## 2015-11-13 DIAGNOSIS — I10 Essential (primary) hypertension: Secondary | ICD-10-CM | POA: Diagnosis not present

## 2015-11-13 DIAGNOSIS — G890 Central pain syndrome: Secondary | ICD-10-CM | POA: Diagnosis not present

## 2015-11-13 DIAGNOSIS — M5416 Radiculopathy, lumbar region: Secondary | ICD-10-CM | POA: Diagnosis not present

## 2015-11-13 DIAGNOSIS — M4726 Other spondylosis with radiculopathy, lumbar region: Secondary | ICD-10-CM | POA: Diagnosis not present

## 2015-11-13 DIAGNOSIS — M4722 Other spondylosis with radiculopathy, cervical region: Secondary | ICD-10-CM | POA: Diagnosis not present

## 2015-11-13 DIAGNOSIS — I69354 Hemiplegia and hemiparesis following cerebral infarction affecting left non-dominant side: Secondary | ICD-10-CM | POA: Diagnosis not present

## 2015-11-14 DIAGNOSIS — M4722 Other spondylosis with radiculopathy, cervical region: Secondary | ICD-10-CM | POA: Diagnosis not present

## 2015-11-14 DIAGNOSIS — M5416 Radiculopathy, lumbar region: Secondary | ICD-10-CM | POA: Diagnosis not present

## 2015-11-14 DIAGNOSIS — G890 Central pain syndrome: Secondary | ICD-10-CM | POA: Diagnosis not present

## 2015-11-14 DIAGNOSIS — I69354 Hemiplegia and hemiparesis following cerebral infarction affecting left non-dominant side: Secondary | ICD-10-CM | POA: Diagnosis not present

## 2015-11-14 DIAGNOSIS — M4726 Other spondylosis with radiculopathy, lumbar region: Secondary | ICD-10-CM | POA: Diagnosis not present

## 2015-11-14 DIAGNOSIS — I10 Essential (primary) hypertension: Secondary | ICD-10-CM | POA: Diagnosis not present

## 2015-11-15 ENCOUNTER — Ambulatory Visit (INDEPENDENT_AMBULATORY_CARE_PROVIDER_SITE_OTHER): Payer: Medicare Other | Admitting: *Deleted

## 2015-11-15 DIAGNOSIS — M4726 Other spondylosis with radiculopathy, lumbar region: Secondary | ICD-10-CM | POA: Diagnosis not present

## 2015-11-15 DIAGNOSIS — M5416 Radiculopathy, lumbar region: Secondary | ICD-10-CM | POA: Diagnosis not present

## 2015-11-15 DIAGNOSIS — G890 Central pain syndrome: Secondary | ICD-10-CM | POA: Diagnosis not present

## 2015-11-15 DIAGNOSIS — I639 Cerebral infarction, unspecified: Secondary | ICD-10-CM | POA: Diagnosis not present

## 2015-11-15 DIAGNOSIS — M4722 Other spondylosis with radiculopathy, cervical region: Secondary | ICD-10-CM | POA: Diagnosis not present

## 2015-11-15 DIAGNOSIS — I69354 Hemiplegia and hemiparesis following cerebral infarction affecting left non-dominant side: Secondary | ICD-10-CM | POA: Diagnosis not present

## 2015-11-15 DIAGNOSIS — I10 Essential (primary) hypertension: Secondary | ICD-10-CM | POA: Diagnosis not present

## 2015-11-16 NOTE — Progress Notes (Signed)
Carelink Summary Report / Loop Recorder 

## 2015-11-20 DIAGNOSIS — G890 Central pain syndrome: Secondary | ICD-10-CM | POA: Diagnosis not present

## 2015-11-20 DIAGNOSIS — M5416 Radiculopathy, lumbar region: Secondary | ICD-10-CM | POA: Diagnosis not present

## 2015-11-20 DIAGNOSIS — M4722 Other spondylosis with radiculopathy, cervical region: Secondary | ICD-10-CM | POA: Diagnosis not present

## 2015-11-20 DIAGNOSIS — I69354 Hemiplegia and hemiparesis following cerebral infarction affecting left non-dominant side: Secondary | ICD-10-CM | POA: Diagnosis not present

## 2015-11-20 DIAGNOSIS — M4726 Other spondylosis with radiculopathy, lumbar region: Secondary | ICD-10-CM | POA: Diagnosis not present

## 2015-11-20 DIAGNOSIS — I10 Essential (primary) hypertension: Secondary | ICD-10-CM | POA: Diagnosis not present

## 2015-11-22 DIAGNOSIS — M4722 Other spondylosis with radiculopathy, cervical region: Secondary | ICD-10-CM | POA: Diagnosis not present

## 2015-11-22 DIAGNOSIS — G890 Central pain syndrome: Secondary | ICD-10-CM | POA: Diagnosis not present

## 2015-11-22 DIAGNOSIS — M5416 Radiculopathy, lumbar region: Secondary | ICD-10-CM | POA: Diagnosis not present

## 2015-11-22 DIAGNOSIS — I10 Essential (primary) hypertension: Secondary | ICD-10-CM | POA: Diagnosis not present

## 2015-11-22 DIAGNOSIS — I69354 Hemiplegia and hemiparesis following cerebral infarction affecting left non-dominant side: Secondary | ICD-10-CM | POA: Diagnosis not present

## 2015-11-22 DIAGNOSIS — M4726 Other spondylosis with radiculopathy, lumbar region: Secondary | ICD-10-CM | POA: Diagnosis not present

## 2015-11-24 DIAGNOSIS — M5416 Radiculopathy, lumbar region: Secondary | ICD-10-CM | POA: Diagnosis not present

## 2015-11-24 DIAGNOSIS — I10 Essential (primary) hypertension: Secondary | ICD-10-CM | POA: Diagnosis not present

## 2015-11-24 DIAGNOSIS — M4726 Other spondylosis with radiculopathy, lumbar region: Secondary | ICD-10-CM | POA: Diagnosis not present

## 2015-11-24 DIAGNOSIS — M4722 Other spondylosis with radiculopathy, cervical region: Secondary | ICD-10-CM | POA: Diagnosis not present

## 2015-11-24 DIAGNOSIS — G890 Central pain syndrome: Secondary | ICD-10-CM | POA: Diagnosis not present

## 2015-11-24 DIAGNOSIS — I69354 Hemiplegia and hemiparesis following cerebral infarction affecting left non-dominant side: Secondary | ICD-10-CM | POA: Diagnosis not present

## 2015-11-27 DIAGNOSIS — I69354 Hemiplegia and hemiparesis following cerebral infarction affecting left non-dominant side: Secondary | ICD-10-CM | POA: Diagnosis not present

## 2015-11-27 DIAGNOSIS — G890 Central pain syndrome: Secondary | ICD-10-CM | POA: Diagnosis not present

## 2015-11-27 DIAGNOSIS — M5416 Radiculopathy, lumbar region: Secondary | ICD-10-CM | POA: Diagnosis not present

## 2015-11-27 DIAGNOSIS — M4726 Other spondylosis with radiculopathy, lumbar region: Secondary | ICD-10-CM | POA: Diagnosis not present

## 2015-11-27 DIAGNOSIS — M4722 Other spondylosis with radiculopathy, cervical region: Secondary | ICD-10-CM | POA: Diagnosis not present

## 2015-11-27 DIAGNOSIS — I10 Essential (primary) hypertension: Secondary | ICD-10-CM | POA: Diagnosis not present

## 2015-11-29 DIAGNOSIS — M5416 Radiculopathy, lumbar region: Secondary | ICD-10-CM | POA: Diagnosis not present

## 2015-11-29 DIAGNOSIS — I10 Essential (primary) hypertension: Secondary | ICD-10-CM | POA: Diagnosis not present

## 2015-11-29 DIAGNOSIS — M4722 Other spondylosis with radiculopathy, cervical region: Secondary | ICD-10-CM | POA: Diagnosis not present

## 2015-11-29 DIAGNOSIS — M4726 Other spondylosis with radiculopathy, lumbar region: Secondary | ICD-10-CM | POA: Diagnosis not present

## 2015-11-29 DIAGNOSIS — G890 Central pain syndrome: Secondary | ICD-10-CM | POA: Diagnosis not present

## 2015-11-29 DIAGNOSIS — I69354 Hemiplegia and hemiparesis following cerebral infarction affecting left non-dominant side: Secondary | ICD-10-CM | POA: Diagnosis not present

## 2015-11-29 LAB — CUP PACEART REMOTE DEVICE CHECK: Date Time Interrogation Session: 20170311204133

## 2015-12-01 DIAGNOSIS — M4722 Other spondylosis with radiculopathy, cervical region: Secondary | ICD-10-CM | POA: Diagnosis not present

## 2015-12-01 DIAGNOSIS — I10 Essential (primary) hypertension: Secondary | ICD-10-CM | POA: Diagnosis not present

## 2015-12-01 DIAGNOSIS — M5416 Radiculopathy, lumbar region: Secondary | ICD-10-CM | POA: Diagnosis not present

## 2015-12-01 DIAGNOSIS — I69354 Hemiplegia and hemiparesis following cerebral infarction affecting left non-dominant side: Secondary | ICD-10-CM | POA: Diagnosis not present

## 2015-12-01 DIAGNOSIS — M4726 Other spondylosis with radiculopathy, lumbar region: Secondary | ICD-10-CM | POA: Diagnosis not present

## 2015-12-01 DIAGNOSIS — G890 Central pain syndrome: Secondary | ICD-10-CM | POA: Diagnosis not present

## 2015-12-03 LAB — CUP PACEART REMOTE DEVICE CHECK: MDC IDC SESS DTM: 20170410211108

## 2015-12-03 NOTE — Progress Notes (Signed)
Carelink summary report received. Battery status OK. Normal device function. No new symptom episodes, tachy episodes, brady, or pause episodes. No new AF episodes. Monthly summary reports and ROV/PRN 

## 2015-12-04 DIAGNOSIS — M5416 Radiculopathy, lumbar region: Secondary | ICD-10-CM | POA: Diagnosis not present

## 2015-12-04 DIAGNOSIS — M4726 Other spondylosis with radiculopathy, lumbar region: Secondary | ICD-10-CM | POA: Diagnosis not present

## 2015-12-04 DIAGNOSIS — M4722 Other spondylosis with radiculopathy, cervical region: Secondary | ICD-10-CM | POA: Diagnosis not present

## 2015-12-04 DIAGNOSIS — I10 Essential (primary) hypertension: Secondary | ICD-10-CM | POA: Diagnosis not present

## 2015-12-04 DIAGNOSIS — I69354 Hemiplegia and hemiparesis following cerebral infarction affecting left non-dominant side: Secondary | ICD-10-CM | POA: Diagnosis not present

## 2015-12-04 DIAGNOSIS — G890 Central pain syndrome: Secondary | ICD-10-CM | POA: Diagnosis not present

## 2015-12-06 DIAGNOSIS — G890 Central pain syndrome: Secondary | ICD-10-CM | POA: Diagnosis not present

## 2015-12-06 DIAGNOSIS — M4726 Other spondylosis with radiculopathy, lumbar region: Secondary | ICD-10-CM | POA: Diagnosis not present

## 2015-12-06 DIAGNOSIS — I10 Essential (primary) hypertension: Secondary | ICD-10-CM | POA: Diagnosis not present

## 2015-12-06 DIAGNOSIS — M4722 Other spondylosis with radiculopathy, cervical region: Secondary | ICD-10-CM | POA: Diagnosis not present

## 2015-12-06 DIAGNOSIS — M5416 Radiculopathy, lumbar region: Secondary | ICD-10-CM | POA: Diagnosis not present

## 2015-12-06 DIAGNOSIS — I69354 Hemiplegia and hemiparesis following cerebral infarction affecting left non-dominant side: Secondary | ICD-10-CM | POA: Diagnosis not present

## 2015-12-08 DIAGNOSIS — M5416 Radiculopathy, lumbar region: Secondary | ICD-10-CM | POA: Diagnosis not present

## 2015-12-08 DIAGNOSIS — I69354 Hemiplegia and hemiparesis following cerebral infarction affecting left non-dominant side: Secondary | ICD-10-CM | POA: Diagnosis not present

## 2015-12-08 DIAGNOSIS — M4726 Other spondylosis with radiculopathy, lumbar region: Secondary | ICD-10-CM | POA: Diagnosis not present

## 2015-12-08 DIAGNOSIS — I10 Essential (primary) hypertension: Secondary | ICD-10-CM | POA: Diagnosis not present

## 2015-12-08 DIAGNOSIS — G890 Central pain syndrome: Secondary | ICD-10-CM | POA: Diagnosis not present

## 2015-12-08 DIAGNOSIS — M4722 Other spondylosis with radiculopathy, cervical region: Secondary | ICD-10-CM | POA: Diagnosis not present

## 2015-12-11 DIAGNOSIS — I69354 Hemiplegia and hemiparesis following cerebral infarction affecting left non-dominant side: Secondary | ICD-10-CM | POA: Diagnosis not present

## 2015-12-11 DIAGNOSIS — M4722 Other spondylosis with radiculopathy, cervical region: Secondary | ICD-10-CM | POA: Diagnosis not present

## 2015-12-11 DIAGNOSIS — M4726 Other spondylosis with radiculopathy, lumbar region: Secondary | ICD-10-CM | POA: Diagnosis not present

## 2015-12-11 DIAGNOSIS — I10 Essential (primary) hypertension: Secondary | ICD-10-CM | POA: Diagnosis not present

## 2015-12-11 DIAGNOSIS — G890 Central pain syndrome: Secondary | ICD-10-CM | POA: Diagnosis not present

## 2015-12-11 DIAGNOSIS — M5416 Radiculopathy, lumbar region: Secondary | ICD-10-CM | POA: Diagnosis not present

## 2015-12-12 DIAGNOSIS — R300 Dysuria: Secondary | ICD-10-CM | POA: Diagnosis not present

## 2015-12-12 DIAGNOSIS — I10 Essential (primary) hypertension: Secondary | ICD-10-CM | POA: Diagnosis not present

## 2015-12-12 DIAGNOSIS — N3941 Urge incontinence: Secondary | ICD-10-CM | POA: Diagnosis not present

## 2015-12-12 DIAGNOSIS — M4722 Other spondylosis with radiculopathy, cervical region: Secondary | ICD-10-CM | POA: Diagnosis not present

## 2015-12-12 DIAGNOSIS — M4726 Other spondylosis with radiculopathy, lumbar region: Secondary | ICD-10-CM | POA: Diagnosis not present

## 2015-12-12 DIAGNOSIS — I69354 Hemiplegia and hemiparesis following cerebral infarction affecting left non-dominant side: Secondary | ICD-10-CM | POA: Diagnosis not present

## 2015-12-12 DIAGNOSIS — G890 Central pain syndrome: Secondary | ICD-10-CM | POA: Diagnosis not present

## 2015-12-12 DIAGNOSIS — M5416 Radiculopathy, lumbar region: Secondary | ICD-10-CM | POA: Diagnosis not present

## 2015-12-13 DIAGNOSIS — M5416 Radiculopathy, lumbar region: Secondary | ICD-10-CM | POA: Diagnosis not present

## 2015-12-13 DIAGNOSIS — G890 Central pain syndrome: Secondary | ICD-10-CM | POA: Diagnosis not present

## 2015-12-13 DIAGNOSIS — I10 Essential (primary) hypertension: Secondary | ICD-10-CM | POA: Diagnosis not present

## 2015-12-13 DIAGNOSIS — I69354 Hemiplegia and hemiparesis following cerebral infarction affecting left non-dominant side: Secondary | ICD-10-CM | POA: Diagnosis not present

## 2015-12-13 DIAGNOSIS — M4722 Other spondylosis with radiculopathy, cervical region: Secondary | ICD-10-CM | POA: Diagnosis not present

## 2015-12-13 DIAGNOSIS — M4726 Other spondylosis with radiculopathy, lumbar region: Secondary | ICD-10-CM | POA: Diagnosis not present

## 2015-12-15 ENCOUNTER — Ambulatory Visit (INDEPENDENT_AMBULATORY_CARE_PROVIDER_SITE_OTHER): Payer: Medicare Other | Admitting: *Deleted

## 2015-12-15 DIAGNOSIS — M4722 Other spondylosis with radiculopathy, cervical region: Secondary | ICD-10-CM | POA: Diagnosis not present

## 2015-12-15 DIAGNOSIS — I69354 Hemiplegia and hemiparesis following cerebral infarction affecting left non-dominant side: Secondary | ICD-10-CM | POA: Diagnosis not present

## 2015-12-15 DIAGNOSIS — I639 Cerebral infarction, unspecified: Secondary | ICD-10-CM | POA: Diagnosis not present

## 2015-12-15 DIAGNOSIS — G890 Central pain syndrome: Secondary | ICD-10-CM | POA: Diagnosis not present

## 2015-12-15 DIAGNOSIS — M4726 Other spondylosis with radiculopathy, lumbar region: Secondary | ICD-10-CM | POA: Diagnosis not present

## 2015-12-15 DIAGNOSIS — I10 Essential (primary) hypertension: Secondary | ICD-10-CM | POA: Diagnosis not present

## 2015-12-15 DIAGNOSIS — M5416 Radiculopathy, lumbar region: Secondary | ICD-10-CM | POA: Diagnosis not present

## 2015-12-17 DIAGNOSIS — M4722 Other spondylosis with radiculopathy, cervical region: Secondary | ICD-10-CM | POA: Diagnosis not present

## 2015-12-17 DIAGNOSIS — I69354 Hemiplegia and hemiparesis following cerebral infarction affecting left non-dominant side: Secondary | ICD-10-CM | POA: Diagnosis not present

## 2015-12-17 DIAGNOSIS — G890 Central pain syndrome: Secondary | ICD-10-CM | POA: Diagnosis not present

## 2015-12-17 DIAGNOSIS — I10 Essential (primary) hypertension: Secondary | ICD-10-CM | POA: Diagnosis not present

## 2015-12-17 DIAGNOSIS — N390 Urinary tract infection, site not specified: Secondary | ICD-10-CM | POA: Diagnosis not present

## 2015-12-17 DIAGNOSIS — M4726 Other spondylosis with radiculopathy, lumbar region: Secondary | ICD-10-CM | POA: Diagnosis not present

## 2015-12-18 NOTE — Progress Notes (Signed)
Carelink Summary Report / Loop Recorder 

## 2015-12-19 DIAGNOSIS — G890 Central pain syndrome: Secondary | ICD-10-CM | POA: Diagnosis not present

## 2015-12-19 DIAGNOSIS — I69354 Hemiplegia and hemiparesis following cerebral infarction affecting left non-dominant side: Secondary | ICD-10-CM | POA: Diagnosis not present

## 2015-12-19 DIAGNOSIS — N390 Urinary tract infection, site not specified: Secondary | ICD-10-CM | POA: Diagnosis not present

## 2015-12-19 DIAGNOSIS — M4722 Other spondylosis with radiculopathy, cervical region: Secondary | ICD-10-CM | POA: Diagnosis not present

## 2015-12-19 DIAGNOSIS — I10 Essential (primary) hypertension: Secondary | ICD-10-CM | POA: Diagnosis not present

## 2015-12-19 DIAGNOSIS — M4726 Other spondylosis with radiculopathy, lumbar region: Secondary | ICD-10-CM | POA: Diagnosis not present

## 2015-12-21 DIAGNOSIS — M4726 Other spondylosis with radiculopathy, lumbar region: Secondary | ICD-10-CM | POA: Diagnosis not present

## 2015-12-21 DIAGNOSIS — N390 Urinary tract infection, site not specified: Secondary | ICD-10-CM | POA: Diagnosis not present

## 2015-12-21 DIAGNOSIS — G890 Central pain syndrome: Secondary | ICD-10-CM | POA: Diagnosis not present

## 2015-12-21 DIAGNOSIS — I69354 Hemiplegia and hemiparesis following cerebral infarction affecting left non-dominant side: Secondary | ICD-10-CM | POA: Diagnosis not present

## 2015-12-21 DIAGNOSIS — I10 Essential (primary) hypertension: Secondary | ICD-10-CM | POA: Diagnosis not present

## 2015-12-21 DIAGNOSIS — M4722 Other spondylosis with radiculopathy, cervical region: Secondary | ICD-10-CM | POA: Diagnosis not present

## 2015-12-22 ENCOUNTER — Inpatient Hospital Stay (HOSPITAL_COMMUNITY)
Admission: EM | Admit: 2015-12-22 | Discharge: 2015-12-26 | DRG: 565 | Disposition: A | Discharge status: Skilled Nursing Facility | Payer: Medicare Other | Attending: Family Medicine | Admitting: Family Medicine

## 2015-12-22 ENCOUNTER — Emergency Department (HOSPITAL_COMMUNITY): Payer: Medicare Other

## 2015-12-22 ENCOUNTER — Encounter (HOSPITAL_COMMUNITY): Payer: Self-pay

## 2015-12-22 DIAGNOSIS — I4891 Unspecified atrial fibrillation: Secondary | ICD-10-CM | POA: Diagnosis present

## 2015-12-22 DIAGNOSIS — G4700 Insomnia, unspecified: Secondary | ICD-10-CM | POA: Diagnosis present

## 2015-12-22 DIAGNOSIS — H9190 Unspecified hearing loss, unspecified ear: Secondary | ICD-10-CM | POA: Diagnosis present

## 2015-12-22 DIAGNOSIS — S0990XA Unspecified injury of head, initial encounter: Secondary | ICD-10-CM | POA: Diagnosis not present

## 2015-12-22 DIAGNOSIS — S299XXA Unspecified injury of thorax, initial encounter: Secondary | ICD-10-CM | POA: Diagnosis not present

## 2015-12-22 DIAGNOSIS — E785 Hyperlipidemia, unspecified: Secondary | ICD-10-CM | POA: Diagnosis present

## 2015-12-22 DIAGNOSIS — W182XXA Fall in (into) shower or empty bathtub, initial encounter: Secondary | ICD-10-CM | POA: Diagnosis present

## 2015-12-22 DIAGNOSIS — T796XXA Traumatic ischemia of muscle, initial encounter: Principal | ICD-10-CM | POA: Diagnosis present

## 2015-12-22 DIAGNOSIS — I10 Essential (primary) hypertension: Secondary | ICD-10-CM | POA: Diagnosis present

## 2015-12-22 DIAGNOSIS — E876 Hypokalemia: Secondary | ICD-10-CM | POA: Diagnosis present

## 2015-12-22 DIAGNOSIS — E039 Hypothyroidism, unspecified: Secondary | ICD-10-CM | POA: Diagnosis not present

## 2015-12-22 DIAGNOSIS — M6282 Rhabdomyolysis: Secondary | ICD-10-CM

## 2015-12-22 DIAGNOSIS — I4510 Unspecified right bundle-branch block: Secondary | ICD-10-CM | POA: Diagnosis not present

## 2015-12-22 DIAGNOSIS — M25511 Pain in right shoulder: Secondary | ICD-10-CM | POA: Diagnosis not present

## 2015-12-22 DIAGNOSIS — M545 Low back pain: Secondary | ICD-10-CM | POA: Diagnosis not present

## 2015-12-22 DIAGNOSIS — R1030 Lower abdominal pain, unspecified: Secondary | ICD-10-CM | POA: Diagnosis not present

## 2015-12-22 DIAGNOSIS — I69354 Hemiplegia and hemiparesis following cerebral infarction affecting left non-dominant side: Secondary | ICD-10-CM | POA: Diagnosis not present

## 2015-12-22 DIAGNOSIS — S3992XA Unspecified injury of lower back, initial encounter: Secondary | ICD-10-CM | POA: Diagnosis not present

## 2015-12-22 DIAGNOSIS — Z823 Family history of stroke: Secondary | ICD-10-CM

## 2015-12-22 DIAGNOSIS — M5489 Other dorsalgia: Secondary | ICD-10-CM | POA: Diagnosis not present

## 2015-12-22 DIAGNOSIS — Z7901 Long term (current) use of anticoagulants: Secondary | ICD-10-CM

## 2015-12-22 DIAGNOSIS — L89151 Pressure ulcer of sacral region, stage 1: Secondary | ICD-10-CM | POA: Diagnosis present

## 2015-12-22 DIAGNOSIS — Y93E1 Activity, personal bathing and showering: Secondary | ICD-10-CM

## 2015-12-22 DIAGNOSIS — Z8249 Family history of ischemic heart disease and other diseases of the circulatory system: Secondary | ICD-10-CM

## 2015-12-22 DIAGNOSIS — W19XXXA Unspecified fall, initial encounter: Secondary | ICD-10-CM

## 2015-12-22 DIAGNOSIS — Z888 Allergy status to other drugs, medicaments and biological substances status: Secondary | ICD-10-CM

## 2015-12-22 DIAGNOSIS — M549 Dorsalgia, unspecified: Secondary | ICD-10-CM | POA: Diagnosis not present

## 2015-12-22 DIAGNOSIS — H409 Unspecified glaucoma: Secondary | ICD-10-CM | POA: Diagnosis present

## 2015-12-22 DIAGNOSIS — M542 Cervicalgia: Secondary | ICD-10-CM | POA: Diagnosis present

## 2015-12-22 DIAGNOSIS — T148 Other injury of unspecified body region: Secondary | ICD-10-CM | POA: Diagnosis not present

## 2015-12-22 DIAGNOSIS — Y92002 Bathroom of unspecified non-institutional (private) residence single-family (private) house as the place of occurrence of the external cause: Secondary | ICD-10-CM

## 2015-12-22 DIAGNOSIS — K219 Gastro-esophageal reflux disease without esophagitis: Secondary | ICD-10-CM | POA: Diagnosis present

## 2015-12-22 DIAGNOSIS — Z79899 Other long term (current) drug therapy: Secondary | ICD-10-CM

## 2015-12-22 HISTORY — DX: Unspecified atrial fibrillation: I48.91

## 2015-12-22 LAB — COMPREHENSIVE METABOLIC PANEL
ALBUMIN: 4.1 g/dL (ref 3.5–5.0)
ALT: 23 U/L (ref 14–54)
AST: 42 U/L — AB (ref 15–41)
Alkaline Phosphatase: 62 U/L (ref 38–126)
Anion gap: 6 (ref 5–15)
BUN: 11 mg/dL (ref 6–20)
CHLORIDE: 105 mmol/L (ref 101–111)
CO2: 30 mmol/L (ref 22–32)
CREATININE: 0.7 mg/dL (ref 0.44–1.00)
Calcium: 9.4 mg/dL (ref 8.9–10.3)
GFR calc Af Amer: >60 mL/min (ref >60)
GLUCOSE: 94 mg/dL (ref 65–99)
POTASSIUM: 3.4 mmol/L — AB (ref 3.5–5.1)
Sodium: 141 mmol/L (ref 135–145)
Total Bilirubin: 0.8 mg/dL (ref 0.3–1.2)
Total Protein: 6.9 g/dL (ref 6.5–8.1)

## 2015-12-22 LAB — CBC WITH DIFFERENTIAL/PLATELET
BASOS ABS: 0 10*3/uL (ref 0.0–0.1)
BASOS PCT: 0 %
EOS PCT: 1 %
Eosinophils Absolute: 0.1 10*3/uL (ref 0.0–0.7)
HCT: 40.3 % (ref 36.0–46.0)
Hemoglobin: 13.2 g/dL (ref 12.0–15.0)
LYMPHS PCT: 16 %
Lymphs Abs: 1.5 10*3/uL (ref 0.7–4.0)
MCH: 28.6 pg (ref 26.0–34.0)
MCHC: 32.8 g/dL (ref 30.0–36.0)
MCV: 87.2 fL (ref 78.0–100.0)
MONO ABS: 0.8 10*3/uL (ref 0.1–1.0)
Monocytes Relative: 9 %
NEUTROS ABS: 6.9 10*3/uL (ref 1.7–7.7)
Neutrophils Relative %: 74 %
PLATELETS: 283 10*3/uL (ref 150–400)
RBC: 4.62 MIL/uL (ref 3.87–5.11)
RDW: 14.7 % (ref 11.5–15.5)
WBC: 9.4 10*3/uL (ref 4.0–10.5)

## 2015-12-22 LAB — CUP PACEART REMOTE DEVICE CHECK
Date Time Interrogation Session: 20170609214136
MDC IDC SESS DTM: 20170510213548

## 2015-12-22 LAB — URINALYSIS, ROUTINE W REFLEX MICROSCOPIC
BILIRUBIN URINE: NEGATIVE
GLUCOSE, UA: NEGATIVE mg/dL
Ketones, ur: NEGATIVE mg/dL
Leukocytes, UA: NEGATIVE
Nitrite: NEGATIVE
PH: 7.5 (ref 5.0–8.0)
Protein, ur: NEGATIVE mg/dL
SPECIFIC GRAVITY, URINE: 1.012 (ref 1.005–1.030)

## 2015-12-22 LAB — URINE MICROSCOPIC-ADD ON
BACTERIA UA: NONE SEEN
SQUAMOUS EPITHELIAL / LPF: NONE SEEN

## 2015-12-22 LAB — CBC AND DIFFERENTIAL
HCT: 40 % (ref 36–46)
Hemoglobin: 13.2 g/dL (ref 12.0–16.0)
PLATELETS: 283 10*3/uL (ref 150–399)
WBC: 9.4 10^3/mL

## 2015-12-22 LAB — HEPATIC FUNCTION PANEL
ALK PHOS: 62 U/L (ref 25–125)
ALT: 23 U/L (ref 7–35)
AST: 42 U/L — AB (ref 13–35)
Bilirubin, Total: 0.8 mg/dL

## 2015-12-22 LAB — BASIC METABOLIC PANEL
BUN: 11 mg/dL (ref 4–21)
CREATININE: 0.7 mg/dL (ref 0.5–1.1)
Glucose: 94 mg/dL
POTASSIUM: 3.4 mmol/L (ref 3.4–5.3)
Sodium: 141 mmol/L (ref 137–147)

## 2015-12-22 LAB — CK: CK TOTAL: 1107 U/L — AB (ref 38–234)

## 2015-12-22 MED ORDER — LATANOPROST 0.005 % OP SOLN
1.0000 [drp] | Freq: Every day | OPHTHALMIC | Status: DC
  Administered 2015-12-22 – 2015-12-25 (×4): 1 [drp] via OPHTHALMIC
  Filled 2015-12-22: qty 2.5

## 2015-12-22 MED ORDER — DORZOLAMIDE HCL-TIMOLOL MAL 2-0.5 % OP SOLN
1.0000 [drp] | Freq: Two times a day (BID) | OPHTHALMIC | Status: DC
  Administered 2015-12-22 – 2015-12-26 (×8): 1 [drp] via OPHTHALMIC
  Filled 2015-12-22: qty 10

## 2015-12-22 MED ORDER — VITAMIN B-12 1000 MCG PO TABS
1000.0000 ug | ORAL_TABLET | Freq: Every day | ORAL | Status: DC
  Administered 2015-12-23 – 2015-12-26 (×4): 1000 ug via ORAL
  Filled 2015-12-22 (×4): qty 1

## 2015-12-22 MED ORDER — SODIUM CHLORIDE 0.9% FLUSH
3.0000 mL | Freq: Two times a day (BID) | INTRAVENOUS | Status: DC
  Administered 2015-12-22 – 2015-12-26 (×2): 3 mL via INTRAVENOUS

## 2015-12-22 MED ORDER — POTASSIUM CHLORIDE IN NACL 20-0.9 MEQ/L-% IV SOLN
INTRAVENOUS | Status: DC
  Administered 2015-12-22: 21:00:00 via INTRAVENOUS
  Filled 2015-12-22 (×3): qty 1000

## 2015-12-22 MED ORDER — LORATADINE 10 MG PO TABS: 10.0000 mg | ORAL_TABLET | Freq: Every day | ORAL | Status: DC | PRN

## 2015-12-22 MED ORDER — METOPROLOL TARTRATE 25 MG PO TABS
25.0000 mg | ORAL_TABLET | Freq: Two times a day (BID) | ORAL | Status: DC
  Administered 2015-12-22 – 2015-12-26 (×7): 25 mg via ORAL
  Filled 2015-12-22 (×8): qty 1

## 2015-12-22 MED ORDER — GABAPENTIN 300 MG PO CAPS
300.0000 mg | ORAL_CAPSULE | Freq: Every day | ORAL | Status: DC
  Administered 2015-12-22 – 2015-12-25 (×4): 300 mg via ORAL
  Filled 2015-12-22 (×5): qty 1

## 2015-12-22 MED ORDER — TAMSULOSIN HCL 0.4 MG PO CAPS
0.4000 mg | ORAL_CAPSULE | Freq: Every day | ORAL | Status: DC
  Administered 2015-12-23 – 2015-12-26 (×4): 0.4 mg via ORAL
  Filled 2015-12-22 (×4): qty 1

## 2015-12-22 MED ORDER — LIDOCAINE 5 % EX PTCH
1.0000 | MEDICATED_PATCH | CUTANEOUS | Status: DC
  Administered 2015-12-23 – 2015-12-25 (×3): 1 via TRANSDERMAL
  Filled 2015-12-22 (×4): qty 1

## 2015-12-22 MED ORDER — BACLOFEN 10 MG PO TABS
10.0000 mg | ORAL_TABLET | Freq: Three times a day (TID) | ORAL | Status: DC
  Administered 2015-12-22 – 2015-12-26 (×11): 10 mg via ORAL
  Filled 2015-12-22 (×11): qty 1

## 2015-12-22 MED ORDER — APIXABAN 5 MG PO TABS
5.0000 mg | ORAL_TABLET | Freq: Two times a day (BID) | ORAL | Status: DC
  Administered 2015-12-22 – 2015-12-26 (×8): 5 mg via ORAL
  Filled 2015-12-22 (×8): qty 1

## 2015-12-22 MED ORDER — MIRTAZAPINE 15 MG PO TABS
7.5000 mg | ORAL_TABLET | Freq: Every day | ORAL | Status: DC
  Administered 2015-12-22 – 2015-12-25 (×4): 7.5 mg via ORAL
  Filled 2015-12-22 (×4): qty 1

## 2015-12-22 MED ORDER — TRAZODONE HCL 50 MG PO TABS
25.0000 mg | ORAL_TABLET | Freq: Every day | ORAL | Status: DC
  Administered 2015-12-22 – 2015-12-25 (×4): 25 mg via ORAL
  Filled 2015-12-22 (×4): qty 1

## 2015-12-22 MED ORDER — PANTOPRAZOLE SODIUM 40 MG PO TBEC
40.0000 mg | DELAYED_RELEASE_TABLET | Freq: Two times a day (BID) | ORAL | Status: DC
  Administered 2015-12-22 – 2015-12-26 (×8): 40 mg via ORAL
  Filled 2015-12-22 (×8): qty 1

## 2015-12-22 MED ORDER — LEVOTHYROXINE SODIUM 88 MCG PO TABS
88.0000 ug | ORAL_TABLET | Freq: Every day | ORAL | Status: DC
  Administered 2015-12-23 – 2015-12-26 (×4): 88 ug via ORAL
  Filled 2015-12-22 (×4): qty 1

## 2015-12-22 MED ORDER — ACETAMINOPHEN 325 MG PO TABS
650.0000 mg | ORAL_TABLET | Freq: Four times a day (QID) | ORAL | Status: DC | PRN
  Administered 2015-12-24 – 2015-12-25 (×2): 650 mg via ORAL
  Filled 2015-12-22 (×2): qty 2

## 2015-12-22 MED ORDER — ACETAMINOPHEN 650 MG RE SUPP
650.0000 mg | Freq: Four times a day (QID) | RECTAL | Status: DC | PRN
Start: 2015-12-22 — End: 2015-12-26

## 2015-12-22 MED ORDER — SODIUM CHLORIDE 0.9 % IV SOLN
Freq: Once | INTRAVENOUS | Status: AC
Start: 2015-12-22 — End: 2015-12-22
  Administered 2015-12-22: 17:00:00 via INTRAVENOUS

## 2015-12-22 NOTE — ED Notes (Signed)
Bed: WA02 Expected date:  Expected time:  Means of arrival:  Comments: Fall elderly

## 2015-12-22 NOTE — ED Provider Notes (Signed)
CSN: [id number]     Arrival date & time 12/22/15  1535 History   First MD Initiated Contact with Patient 12/22/15 1559     Chief Complaint  Patient presents with  . Fall      HPI  Patient presents for evaluation after a fall.  She has a previous right intercerebral stroke with resultant left lower leg weakness. She lives independently. Last night she was in her shower she estimates approximately 8:30. She fell. She laid in her bathtub unable to get out the cause of some pain in her back, in her left leg weakness. She does not answer the phone. Family went and discovered her still in her bathtub at 2:00 this afternoon. EMS was summoned. She was transferred here. She complains of pain in the posterior aspect of her shoulder and in her low back. States she hit her head but it does not hurt.  Past Medical History  Diagnosis Date  . Hypothyroidism   . Arthritis   . GERD (gastroesophageal reflux disease)   . Dysphagia   . Hyperlipidemia   . Right bundle branch block 03/21/2014  . HTN (hypertension) 10/30/2012  . Unspecified hypothyroidism 10/30/2012  . Severe hearing loss   . DJD (degenerative joint disease), lumbar   . Rotoscoliosis   . Glaucoma   . DJD (degenerative joint disease), cervical   . Chronic neck pain   . Nocturia   . Insomnia   . Stroke (HCC)   . Shortness of breath dyspnea   . Atrial fibrillation [facility])    Past Surgical History  Procedure Laterality Date  . Breast surgery    . Back surgery      lower back   . Esophagogastroduodenoscopy (egd) with esophageal dilation  06/08/2012    Procedure: ESOPHAGOGASTRODUODENOSCOPY (EGD) WITH ESOPHAGEAL DILATION;  Surgeon: Charolett Bumpers, MD;  Location: [facility] ENDOSCOPY;  Service: Endoscopy;  Laterality: N/A;  . Bladder laser surgery    . Left carpal tunnel surgery    . Facail surgery  2000    facelift and eyelid lift  . Cataract surgery    . Lumbar laminectomy/decompression microdiscectomy N/A 01/17/2015    Procedure:  Laminectomy and Foraminotomy - L2-L3 - L3-L4;  Surgeon: [name], MD;  Location: [facility]UR[facility]S;  Service: Neurosurgery;  Laterality: N/A;  Laminectomy and Foraminotomy - L2-L3 - L3-L4  . Ep implantable device N/A 01/19/2015    Procedure: Loop Recorder Insertion;  Surgeon: Marinus Maw, MD;  Location: [facility] INVASIVE CV LAB;  Service: Cardiovascular;  Laterality: N/A;  . Tee without cardioversion N/A 01/19/2015    Procedure: TRANSESOPHAGEAL ECHOCARDIOGRAM (TEE);  Surgeon: Chrystie Nose, MD;  Location: [facility] ENDOSCOPY;  Service: Cardiovascular;  Laterality: N/A;   Family History  Problem Relation Age of Onset  . CAD Father   . Stroke Father   . CAD Brother   . Diabetes Mother   . Lung cancer Other    Social History  Substance Use Topics  . Smoking status: Never Smoker   . Smokeless tobacco: None  . Alcohol Use: No   OB History    No data available     Review of Systems  Constitutional: Negative for fever, chills, diaphoresis, appetite change and fatigue.  HENT: Negative for mouth sores, sore throat and trouble swallowing.   Eyes: Negative for visual disturbance.  Respiratory: Negative for cough, chest tightness, shortness of breath and wheezing.   Cardiovascular: Negative for chest pain.  Gastrointestinal: Negative for nausea, vomiting, abdominal pain, diarrhea and  abdominal distention.  Endocrine: Negative for polydipsia, polyphagia and polyuria.  Genitourinary: Negative for dysuria, frequency and hematuria.  Musculoskeletal: Positive for back pain. Negative for gait problem.       Right shoulder pain  Skin: Negative for color change, pallor and rash.  Neurological: Negative for dizziness, syncope, light-headedness and headaches.  Hematological: Does not bruise/bleed easily.  Psychiatric/Behavioral: Negative for behavioral problems and confusion.      Allergies  Ace inhibitors; Lipitor; and Motrin  Home Medications   Prior to Admission medications   Medication Sig Start  Date End Date Taking? Authorizing Provider  apixaban (ELIQUIS) 5 MG TABS tablet Take 1 tablet (5 mg total) by mouth 2 (two) times daily. 06/06/15  Yes Marinus Maw, MD  baclofen (LIORESAL) 10 MG tablet Take 1 tablet (10 mg total) by mouth 3 (three) times daily. And 20 mg at bedtime Patient taking differently: Take 10 mg by mouth every 6 (six) hours.  03/22/15  Yes Micki Riley, MD  benzonatate (TESSALON) 100 MG capsule Take 1 capsule (100 mg total) by mouth 3 (three) times daily as needed for cough. 03/15/15  Yes Sharon Seller, NP  BIOTIN PO Take by mouth daily.   Yes Historical Provider, MD  ciprofloxacin (CIPRO) 250 MG tablet Take 250 mg by mouth 2 (two) times daily. Starting 12/13/15 for 7 days. 12/12/15  Yes Historical Provider, MD  Cyanocobalamin (VITAMIN B-12 SL) Place under the tongue daily.   Yes Historical Provider, MD  dorzolamide-timolol (COSOPT) 22.3-6.8 MG/ML ophthalmic solution Place 1 drop into both eyes 2 (two) times daily.   Yes Historical Provider, MD  gabapentin (NEURONTIN) 300 MG capsule Take 300 mg by mouth at bedtime.    Yes Historical Provider, MD  latanoprost (XALATAN) 0.005 % ophthalmic solution Place 1 drop into both eyes at bedtime.   Yes Historical Provider, MD  levothyroxine (SYNTHROID, LEVOTHROID) 88 MCG tablet Take 88 mcg by mouth daily before breakfast.   Yes Historical Provider, MD  lidocaine (LIDODERM) 5 % Place 1 patch onto the skin daily. Remove & Discard patch within 12 hours or as directed by MD   Yes Historical Provider, MD  loratadine (CLARITIN) 10 MG tablet Take 10 mg by mouth daily as needed for allergies.    Yes Historical Provider, MD  metoprolol tartrate (LOPRESSOR) 25 MG tablet Take 25 mg by mouth 2 (two) times daily.   Yes Historical Provider, MD  mirtazapine (REMERON) 7.5 MG tablet Take 7.5 mg by mouth at bedtime.   Yes Historical Provider, MD  pantoprazole (PROTONIX) 40 MG tablet Take 40 mg by mouth 2 (two) times daily.   Yes Historical Provider, MD   Phenazopyridine HCl (AZO TABS PO) Take by mouth.   Yes Historical Provider, MD  tamsulosin (FLOMAX) 0.4 MG CAPS capsule Take 0.4 mg by mouth daily.   Yes Historical Provider, MD  traMADol (ULTRAM) 50 MG tablet Take 1/2 tablet by mouth every 4 hours as needed for mild pain; Take one tablet by mouth every 4 hours as needed for moderate to severe pain 03/28/15  Yes Sharon Seller, NP  traZODone (DESYREL) 50 MG tablet Take 0.5 tablets (25 mg total) by mouth at bedtime. Patient taking differently: Take 50 mg by mouth at bedtime.  02/09/15  Yes Daniel J Angiulli, PA-C   BP 155/83 mmHg  Pulse 77  Temp(Src) 98.4 F (36.9 C) (Oral)  Resp 22  Ht 5\' 4"  (1.626 m)  Wt 145 lb (65.772 kg)  BMI 24.88 kg/m2  SpO2 96% Physical Exam  Constitutional: She is oriented to person, place, and time. She appears well-developed and well-nourished. No distress.  Awake and alert. Mentating well. Very hard of hearing. Does read lips.  HENT:  Head: Normocephalic.  Eyes: Conjunctivae are normal. Pupils are equal, round, and reactive to light. No scleral icterus.  Neck: Normal range of motion. Neck supple. No thyromegaly present.  Cardiovascular: Normal rate.  An irregularly irregular rhythm present. Exam reveals no gallop and no friction rub.   No murmur heard. A. fib on the monitor.  Pulmonary/Chest: Effort normal and breath sounds normal. No respiratory distress. She has no wheezes. She has no rales.  Abdominal: Soft. Bowel sounds are normal. She exhibits no distension. There is no tenderness. There is no rebound.  Musculoskeletal: Normal range of motion.  Complains of low back pain but has no areas of tenderness. No obvious skin breakdown or decubiti.  Erythema circular area in the posterior aspect of the right shoulder. Full range of motion. No crepitus. No dislocation or deformity.  Neurological: She is alert and oriented to person, place, and time.  Skin: Skin is warm and dry. No rash noted.  Psychiatric:  She has a normal mood and affect. Her behavior is normal.    ED Course  Procedures (including critical care time) Labs Review Labs Reviewed  CK - Abnormal; Notable for the following:    Total CK 1107 (*)    All other components within normal limits  COMPREHENSIVE METABOLIC PANEL - Abnormal; Notable for the following:    Potassium 3.4 (*)    AST 42 (*)    All other components within normal limits  URINALYSIS, ROUTINE W REFLEX MICROSCOPIC (NOT AT [facility]) - Abnormal; Notable for the following:    Hgb urine dipstick TRACE (*)    All other components within normal limits  CBC WITH DIFFERENTIAL/PLATELET  URINE MICROSCOPIC-ADD ON    Imaging Review Dg Lumbar Spine Complete  12/22/2015  CLINICAL DATA:  Low back pain after fall last night. EXAM: LUMBAR SPINE - COMPLETE 4+ VIEW COMPARISON:  Radiographs of December 12, 2014. FINDINGS: Moderate dextroscoliosis of lumbar spine is noted. Status post surgical posterior fusion of L4-5 with bilateral intrapedicular screw placement. No fracture or spondylolisthesis is noted. Moderate degenerative disc disease is noted at L1-2 and L2-3. Atherosclerosis of abdominal aorta is noted. IMPRESSION: Postsurgical and degenerative changes as described above. No acute abnormality seen in the lumbar spine. Electronically Signed   By: Lupita Raider, M.D.   On: 12/22/2015 17:56   Dg Pelvis 1-2 Views  12/22/2015  CLINICAL DATA:  Fall in bathtub last night with low back pain as well as sacral and buttock pain. EXAM: PELVIS - 1-2 VIEW COMPARISON:  CT 10/30/2012 FINDINGS: Examination demonstrates mild diffuse decreased bone mineralization. Fusion hardware is present over the lower lumbar spine intact. There are degenerative changes of the spine and mild degenerative changes of the hips. No acute fracture or dislocation. IMPRESSION: No acute findings. Electronically Signed   By: Elberta Fortis M.D.   On: 12/22/2015 18:03   Dg Shoulder Right  12/22/2015  CLINICAL DATA:  Right  shoulder pain after fall at home last night. Initial encounter. EXAM: RIGHT SHOULDER - 2+ VIEW COMPARISON:  None. FINDINGS: There is no evidence of fracture or dislocation. There is no evidence of arthropathy or other focal bone abnormality. Soft tissues are unremarkable. IMPRESSION: Normal right shoulder. Electronically Signed   By: Lupita Raider, M.D.   On: 12/22/2015 19:36  Ct Head Wo Contrast  12/22/2015  CLINICAL DATA:  Fall at home last night. Denies loss of consciousness. EXAM: CT HEAD WITHOUT CONTRAST TECHNIQUE: Contiguous axial images were obtained from the base of the skull through the vertex without intravenous contrast. COMPARISON:  CT and MRI scan of January 18, 2015. FINDINGS: Bony calvarium appears intact. Encephalomalacia of the right ACA territory is noted consistent with old infarction. Stable lacunar infarction in left basal ganglia is noted. No mass effect or midline shift is noted. Ventricular size is within normal limits. There is no evidence of mass lesion, hemorrhage or acute infarction. IMPRESSION: Old right ACA infarction as described above. Old lacunar infarction in left basal ganglia. No acute intracranial abnormality seen. Electronically Signed   By: Lupita RaiderJames  Green Jr, M.D.   On: 12/22/2015 18:03   I have personally reviewed and evaluated these images and lab results as part of my medical decision-making.   EKG Interpretation None      MDM   Final diagnoses:  Traumatic rhabdomyolysis, initial encounter (HCC)  Fall, initial encounter   Shoulder films normal. Lumbar spine and pelvis show no acute abnormalities. Normal CT of the head. Normal renal function. No leukocytosis or anemia. CK 1100. My concern is that on her initial presentation she did not show erythema of the shoulder. She continues to complain of low back and buttock pain. Think she may be continuing to evolve rhabdomyolysis. Discussed the case with Dr. Selena BattenKim of [facility] hospitalist. Patient be admitted for serial  enzyme testing and hydration.     Rolland PorterMark Seini Lannom, MD 12/22/15 [phone]

## 2015-12-22 NOTE — ED Notes (Signed)
PT RECEIVED FROM HOME VIA EMS FOR A FALL. PT FELL IN THE BATHTUB LAST NIGHT AROUND 8:30PM AND WAS FOUND BY HER FAMILY TODAY AT 2:30PM. PT DENIES HEAD INJURY OR LOC. PT C/O PAIN TO THE SACRAL AND BUTTOCKS AREA.

## 2015-12-22 NOTE — H&P (Signed)
TRH H&P   Patient Demographics:    Angela Keith, is a 82 y.o. female  MRN: [id number]   DOB - May 01, 1930  Admit Date - 12/22/2015  Outpatient Primary MD for the patient is [name], MD  Referring MD/NP/PA: Rolland Porter  Outpatient Specialists: Lewayne Bunting (cardiologist)  Patient coming from:  home  Chief Complaint  Patient presents with  . Fall      HPI:    Angela Keith  is a 82 y.o. female,   W/ Hypertension, Hyperlipidemia, Hypothyroidism, CVA, with residual left sided weakness,  Apparently c/o fall at home while getting up from the toilet , into the bathtub . The patient is a poor historian and it is uncertain when the fall occurred, possibly about 8:30am this am vs 8: 30pm last nite. Pt was brought to ED for evaluation by family and w/up showed cpk elevation.  Pt will be admitted for mild rhabdomyolysis    Review of systems:    In addition to the HPI above,  No Fever-chills, No Headache, No changes with Vision or hearing, No problems swallowing food or Liquids, No Chest pain, Cough or Shortness of Breath, No Abdominal pain, No Nausea or Vommitting, Bowel movements are regular, No Blood in stool or Urine, No dysuria, No new skin rashes or bruises, No new joints pains-aches,  No new weakness, tingling, numbness in any extremity, has residual left sided weakness due to cva.  No recent weight gain or loss, No polyuria, polydypsia or polyphagia, No significant Mental Stressors.  A full 10 point Review of Systems was done, except as stated above, all other Review of Systems were negative.   With Past History of the following :    Past Medical History  Diagnosis Date  . Hypothyroidism   . Arthritis   . GERD (gastroesophageal reflux disease)   . Dysphagia   . Hyperlipidemia   . Right bundle branch block 03/21/2014  . HTN (hypertension) 10/30/2012  .  Unspecified hypothyroidism 10/30/2012  . Severe hearing loss   . DJD (degenerative joint disease), lumbar   . Rotoscoliosis   . Glaucoma   . DJD (degenerative joint disease), cervical   . Chronic neck pain   . Nocturia   . Insomnia   . Stroke (HCC)   . Shortness of breath dyspnea   . Atrial fibrillation [facility])       Past Surgical History  Procedure Laterality Date  . Breast surgery    . Back surgery      lower back   . Esophagogastroduodenoscopy (egd) with esophageal dilation  06/08/2012    Procedure: ESOPHAGOGASTRODUODENOSCOPY (EGD) WITH ESOPHAGEAL DILATION;  Surgeon: Charolett Bumpers, MD;  Location: [facility] ENDOSCOPY;  Service: Endoscopy;  Laterality: N/A;  . Bladder laser surgery    . Left carpal tunnel surgery    . Facail surgery  2000    facelift and eyelid lift  .  Cataract surgery    . Lumbar laminectomy/decompression microdiscectomy N/A 01/17/2015    Procedure: Laminectomy and Foraminotomy - L2-L3 - L3-L4;  Surgeon: Julio SicksHenry Pool, MD;  Location: [facility] NEURO ORS;  Service: Neurosurgery;  Laterality: N/A;  Laminectomy and Foraminotomy - L2-L3 - L3-L4  . Ep implantable device N/A 01/19/2015    Procedure: Loop Recorder Insertion;  Surgeon: Marinus MawGregg W Taylor, MD;  [facility];  Service: Cardiovascular;  Laterality: N/A;  . Tee without cardioversion N/A 01/19/2015    Procedure: TRANSESOPHAGEAL ECHOCARDIOGRAM (TEE);  Surgeon: Chrystie NoseKenneth C Hilty, MD;  [facility];  Service: Cardiovascular;  Laterality: N/A;      Social History:     Social History  Substance Use Topics  . Smoking status: Never Smoker   . Smokeless tobacco: Not on file  . Alcohol Use: No     Lives - at home  Mobility - moves about in wheel chair    Family History :     Family History  Problem Relation Age of Onset  . CAD Father   . Stroke Father   . CAD Brother   . Diabetes Mother   . Lung cancer Other       Home Medications:   Prior to Admission medications   Medication Sig Start  Date End Date Taking? Authorizing Provider  apixaban (ELIQUIS) 5 MG TABS tablet Take 1 tablet (5 mg total) by mouth 2 (two) times daily. 06/06/15  Yes Marinus MawGregg W Taylor, MD  baclofen (LIORESAL) 10 MG tablet Take 1 tablet (10 mg total) by mouth 3 (three) times daily. And 20 mg at bedtime Patient taking differently: Take 10 mg by mouth every 6 (six) hours.  03/22/15  Yes Micki RileyPramod S Sethi, MD  benzonatate (TESSALON) 100 MG capsule Take 1 capsule (100 mg total) by mouth 3 (three) times daily as needed for cough. 03/15/15  Yes Sharon SellerJessica K Eubanks, NP  BIOTIN PO Take by mouth daily.   Yes Historical Provider, MD  ciprofloxacin (CIPRO) 250 MG tablet Take 250 mg by mouth 2 (two) times daily. Starting 12/13/15 for 7 days. 12/12/15  Yes Historical Provider, MD  Cyanocobalamin (VITAMIN B-12 SL) Place under the tongue daily.   Yes Historical Provider, MD  dorzolamide-timolol (COSOPT) 22.3-6.8 MG/ML ophthalmic solution Place 1 drop into both eyes 2 (two) times daily.   Yes Historical Provider, MD  gabapentin (NEURONTIN) 300 MG capsule Take 300 mg by mouth at bedtime.    Yes Historical Provider, MD  latanoprost (XALATAN) 0.005 % ophthalmic solution Place 1 drop into both eyes at bedtime.   Yes Historical Provider, MD  levothyroxine (SYNTHROID, LEVOTHROID) 88 MCG tablet Take 88 mcg by mouth daily before breakfast.   Yes Historical Provider, MD  lidocaine (LIDODERM) 5 % Place 1 patch onto the skin daily. Remove & Discard patch within 12 hours or as directed by MD   Yes Historical Provider, MD  loratadine (CLARITIN) 10 MG tablet Take 10 mg by mouth daily as needed for allergies.    Yes Historical Provider, MD  metoprolol tartrate (LOPRESSOR) 25 MG tablet Take 25 mg by mouth 2 (two) times daily.   Yes Historical Provider, MD  mirtazapine (REMERON) 7.5 MG tablet Take 7.5 mg by mouth at bedtime.   Yes Historical Provider, MD  pantoprazole (PROTONIX) 40 MG tablet Take 40 mg by mouth 2 (two) times daily.   Yes Historical Provider, MD    Phenazopyridine HCl (AZO TABS PO) Take by mouth.   Yes Historical Provider, MD  tamsulosin (FLOMAX) 0.4 MG CAPS capsule Take 0.4 mg by mouth daily.   Yes Historical Provider, MD  traMADol (ULTRAM) 50 MG tablet Take 1/2 tablet by mouth every 4 hours as needed for mild pain; Take one tablet by mouth every 4 hours as needed for moderate to severe pain 03/28/15  Yes Sharon Seller, NP  traZODone (DESYREL) 50 MG tablet Take 0.5 tablets (25 mg total) by mouth at bedtime. Patient taking differently: Take 50 mg by mouth at bedtime.  02/09/15  Yes Daniel J Angiulli, PA-C     Allergies:     Allergies  Allergen Reactions  . Ace Inhibitors Other (See Comments)    Severe fatigue  . Lipitor [Atorvastatin] Other (See Comments)    Mood swings  . Motrin [Ibuprofen] Nausea And Vomiting     Physical Exam:   Vitals  Blood pressure 155/83, pulse 77, temperature 98.4 F (36.9 C), temperature source Oral, resp. rate 22, height 5\' 4"  (1.626 m), weight 65.772 kg (145 lb), SpO2 96 %.   1. General  ying in bed in NAD,    2. Normal affect and insight, Not Suicidal or Homicidal, Awake Alert, Oriented X 3  3. No F.N deficits, ALL C.Nerves Intact, Strength 5/5 in allextremities except the left upper and left lower ext 5-/5, Sensation intact all 4 extremities, Plantars down going.  4. Ears and Eyes appear Normal, Conjunctivae clear, PERRLA. Moist Oral Mucosa.  5. Supple Neck, No JVD, No cervical lymphadenopathy appriciated, No Carotid Bruits.  6. Symmetrical Chest wall movement, Good air movement bilaterally, CTAB.  7. RRR, No Gallops, Rubs or Murmurs, No Parasternal Heave.  8. Positive Bowel Sounds, Abdomen Soft, No tenderness, No organomegaly appriciated,No rebound -guarding or rigidity.  9.  No Cyanosis, Normal Skin Turgor, No Skin Rash, slight bruise on back  10. Good muscle tone,  joints appear normal , no effusions, Normal ROM.  11. No Palpable Lymph Nodes in Neck or Axillae     Data  Review:    CBC  Recent Labs Lab 12/22/15 1654  WBC 9.4  HGB 13.2  HCT 40.3  PLT 283  MCV 87.2  MCH 28.6  MCHC 32.8  RDW 14.7  LYMPHSABS 1.5  MONOABS 0.8  EOSABS 0.1  BASOSABS 0.0   ------------------------------------------------------------------------------------------------------------------  Chemistries   Recent Labs Lab 12/22/15 1654  NA 141  K 3.4*  CL 105  CO2 30  GLUCOSE 94  BUN 11  CREATININE 0.70  CALCIUM 9.4  AST 42*  ALT 23  ALKPHOS 62  BILITOT 0.8   ------------------------------------------------------------------------------------------------------------------ estimated creatinine clearance is 49.7 mL/min (by C-G formula based on Cr of 0.7). ------------------------------------------------------------------------------------------------------------------ No results for input(s): TSH, T4TOTAL, T3FREE, THYROIDAB in the last 72 hours.  Invalid input(s): FREET3  Coagulation profile No results for input(s): INR, PROTIME in the last 168 hours. ------------------------------------------------------------------------------------------------------------------- No results for input(s): DDIMER in the last 72 hours. -------------------------------------------------------------------------------------------------------------------  Cardiac Enzymes No results for input(s): CKMB, TROPONINI, MYOGLOBIN in the last 168 hours.  Invalid input(s): CK ------------------------------------------------------------------------------------------------------------------ No results found for: BNP   ---------------------------------------------------------------------------------------------------------------  Urinalysis    Component Value Date/Time   COLORURINE YELLOW 12/22/2015 1648   APPEARANCEUR CLEAR 12/22/2015 1648   LABSPEC 1.012 12/22/2015 1648   PHURINE 7.5 12/22/2015 1648   GLUCOSEU NEGATIVE 12/22/2015 1648   HGBUR TRACE* 12/22/2015 1648    BILIRUBINUR NEGATIVE 12/22/2015 1648   KETONESUR NEGATIVE 12/22/2015 1648   PROTEINUR NEGATIVE 12/22/2015 1648   UROBILINOGEN 1.0 01/25/2015 0547   NITRITE NEGATIVE 12/22/2015 1648  LEUKOCYTESUR NEGATIVE 12/22/2015 1648    ----------------------------------------------------------------------------------------------------------------   Imaging Results:    Dg Lumbar Spine Complete  12/22/2015  CLINICAL DATA:  Low back pain after fall last night. EXAM: LUMBAR SPINE - COMPLETE 4+ VIEW COMPARISON:  Radiographs of December 12, 2014. FINDINGS: Moderate dextroscoliosis of lumbar spine is noted. Status post surgical posterior fusion of L4-5 with bilateral intrapedicular screw placement. No fracture or spondylolisthesis is noted. Moderate degenerative disc disease is noted at L1-2 and L2-3. Atherosclerosis of abdominal aorta is noted. IMPRESSION: Postsurgical and degenerative changes as described above. No acute abnormality seen in the lumbar spine. Electronically Signed   By: Lupita Raider, M.D.   On: 12/22/2015 17:56   Dg Pelvis 1-2 Views  12/22/2015  CLINICAL DATA:  Fall in bathtub last night with low back pain as well as sacral and buttock pain. EXAM: PELVIS - 1-2 VIEW COMPARISON:  CT 10/30/2012 FINDINGS: Examination demonstrates mild diffuse decreased bone mineralization. Fusion hardware is present over the lower lumbar spine intact. There are degenerative changes of the spine and mild degenerative changes of the hips. No acute fracture or dislocation. IMPRESSION: No acute findings. Electronically Signed   By: Elberta Fortis M.D.   On: 12/22/2015 18:03   Dg Shoulder Right  12/22/2015  CLINICAL DATA:  Right shoulder pain after fall at home last night. Initial encounter. EXAM: RIGHT SHOULDER - 2+ VIEW COMPARISON:  None. FINDINGS: There is no evidence of fracture or dislocation. There is no evidence of arthropathy or other focal bone abnormality. Soft tissues are unremarkable. IMPRESSION: Normal right  shoulder. Electronically Signed   By: Lupita Raider, M.D.   On: 12/22/2015 19:36   Ct Head Wo Contrast  12/22/2015  CLINICAL DATA:  Fall at home last night. Denies loss of consciousness. EXAM: CT HEAD WITHOUT CONTRAST TECHNIQUE: Contiguous axial images were obtained from the base of the skull through the vertex without intravenous contrast. COMPARISON:  CT and MRI scan of January 18, 2015. FINDINGS: Bony calvarium appears intact. Encephalomalacia of the right ACA territory is noted consistent with old infarction. Stable lacunar infarction in left basal ganglia is noted. No mass effect or midline shift is noted. Ventricular size is within normal limits. There is no evidence of mass lesion, hemorrhage or acute infarction. IMPRESSION: Old right ACA infarction as described above. Old lacunar infarction in left basal ganglia. No acute intracranial abnormality seen. Electronically Signed   By: Lupita Raider, M.D.   On: 12/22/2015 18:03       Assessment & Plan:    Active Problems:   Rhabdomyolysis    1. Rhabdo Ns iv,  Serial cpk  2.  Pafib Cont eliquis  3. CVA Check lipid If LDL high, maybe a candidate for PSK9 inhibitor  4.  Hypokalemia Will replete.   5.  Hypothyroidism  Tsh    DVT Prophylaxis on eliquis  AM Labs Ordered, also please review Full Orders  Family Communication: Admission, patients condition and plan of care including tests being ordered have been discussed with the patient  who indicate understanding and agree with the plan and Code Status.  Code Status FULL CODE  Likely DC to  Home vs rehab  Condition GUARDED    Consults called: none  Admission status: observation   Time spent in minutes : 45 minutes   Pearson Grippe M.D on 12/22/2015 at 7:44 PM  Between 7am to 7pm - Pager - [phone]. After 7pm go to [email or web address] - password [email or web address]  [facility] -  Office  [phone]

## 2015-12-23 DIAGNOSIS — M6282 Rhabdomyolysis: Secondary | ICD-10-CM | POA: Diagnosis not present

## 2015-12-23 DIAGNOSIS — T796XXA Traumatic ischemia of muscle, initial encounter: Secondary | ICD-10-CM | POA: Diagnosis not present

## 2015-12-23 DIAGNOSIS — M549 Dorsalgia, unspecified: Secondary | ICD-10-CM | POA: Diagnosis not present

## 2015-12-23 DIAGNOSIS — Z79899 Other long term (current) drug therapy: Secondary | ICD-10-CM | POA: Diagnosis not present

## 2015-12-23 DIAGNOSIS — Z7901 Long term (current) use of anticoagulants: Secondary | ICD-10-CM | POA: Diagnosis not present

## 2015-12-23 DIAGNOSIS — I160 Hypertensive urgency: Secondary | ICD-10-CM | POA: Diagnosis not present

## 2015-12-23 DIAGNOSIS — Y92002 Bathroom of unspecified non-institutional (private) residence single-family (private) house as the place of occurrence of the external cause: Secondary | ICD-10-CM | POA: Diagnosis not present

## 2015-12-23 DIAGNOSIS — Z823 Family history of stroke: Secondary | ICD-10-CM | POA: Diagnosis not present

## 2015-12-23 DIAGNOSIS — E876 Hypokalemia: Secondary | ICD-10-CM | POA: Diagnosis present

## 2015-12-23 DIAGNOSIS — W182XXA Fall in (into) shower or empty bathtub, initial encounter: Secondary | ICD-10-CM | POA: Diagnosis present

## 2015-12-23 DIAGNOSIS — L8990 Pressure ulcer of unspecified site, unspecified stage: Secondary | ICD-10-CM | POA: Insufficient documentation

## 2015-12-23 DIAGNOSIS — M6281 Muscle weakness (generalized): Secondary | ICD-10-CM | POA: Diagnosis not present

## 2015-12-23 DIAGNOSIS — Z8249 Family history of ischemic heart disease and other diseases of the circulatory system: Secondary | ICD-10-CM | POA: Diagnosis not present

## 2015-12-23 DIAGNOSIS — Z888 Allergy status to other drugs, medicaments and biological substances status: Secondary | ICD-10-CM | POA: Diagnosis not present

## 2015-12-23 DIAGNOSIS — I10 Essential (primary) hypertension: Secondary | ICD-10-CM | POA: Diagnosis present

## 2015-12-23 DIAGNOSIS — H9190 Unspecified hearing loss, unspecified ear: Secondary | ICD-10-CM | POA: Diagnosis present

## 2015-12-23 DIAGNOSIS — E039 Hypothyroidism, unspecified: Secondary | ICD-10-CM | POA: Diagnosis present

## 2015-12-23 DIAGNOSIS — E785 Hyperlipidemia, unspecified: Secondary | ICD-10-CM | POA: Diagnosis present

## 2015-12-23 DIAGNOSIS — L89151 Pressure ulcer of sacral region, stage 1: Secondary | ICD-10-CM | POA: Diagnosis present

## 2015-12-23 DIAGNOSIS — H409 Unspecified glaucoma: Secondary | ICD-10-CM | POA: Diagnosis present

## 2015-12-23 DIAGNOSIS — Y93E1 Activity, personal bathing and showering: Secondary | ICD-10-CM | POA: Diagnosis not present

## 2015-12-23 DIAGNOSIS — I4510 Unspecified right bundle-branch block: Secondary | ICD-10-CM | POA: Diagnosis present

## 2015-12-23 DIAGNOSIS — Z9181 History of falling: Secondary | ICD-10-CM | POA: Diagnosis not present

## 2015-12-23 DIAGNOSIS — K219 Gastro-esophageal reflux disease without esophagitis: Secondary | ICD-10-CM | POA: Diagnosis present

## 2015-12-23 DIAGNOSIS — M542 Cervicalgia: Secondary | ICD-10-CM | POA: Diagnosis present

## 2015-12-23 DIAGNOSIS — G4700 Insomnia, unspecified: Secondary | ICD-10-CM | POA: Diagnosis present

## 2015-12-23 DIAGNOSIS — I4891 Unspecified atrial fibrillation: Secondary | ICD-10-CM | POA: Diagnosis present

## 2015-12-23 DIAGNOSIS — I69354 Hemiplegia and hemiparesis following cerebral infarction affecting left non-dominant side: Secondary | ICD-10-CM | POA: Diagnosis not present

## 2015-12-23 DIAGNOSIS — R2689 Other abnormalities of gait and mobility: Secondary | ICD-10-CM | POA: Diagnosis not present

## 2015-12-23 LAB — CBC
HCT: 36 % (ref 36.0–46.0)
Hemoglobin: 11.9 g/dL — ABNORMAL LOW (ref 12.0–15.0)
MCH: 28.5 pg (ref 26.0–34.0)
MCHC: 33.1 g/dL (ref 30.0–36.0)
MCV: 86.1 fL (ref 78.0–100.0)
PLATELETS: 243 10*3/uL (ref 150–400)
RBC: 4.18 MIL/uL (ref 3.87–5.11)
RDW: 14.9 % (ref 11.5–15.5)
WBC: 6.9 10*3/uL (ref 4.0–10.5)

## 2015-12-23 LAB — COMPREHENSIVE METABOLIC PANEL
ALBUMIN: 3.4 g/dL — AB (ref 3.5–5.0)
ALK PHOS: 51 U/L (ref 38–126)
ALT: 19 U/L (ref 14–54)
AST: 33 U/L (ref 15–41)
Anion gap: 5 (ref 5–15)
BUN: 12 mg/dL (ref 6–20)
CALCIUM: 8.8 mg/dL — AB (ref 8.9–10.3)
CO2: 28 mmol/L (ref 22–32)
CREATININE: 0.8 mg/dL (ref 0.44–1.00)
Chloride: 107 mmol/L (ref 101–111)
GFR calc Af Amer: >60 mL/min (ref >60)
GFR calc non Af Amer: >60 mL/min (ref >60)
GLUCOSE: 81 mg/dL (ref 65–99)
Potassium: 3.3 mmol/L — ABNORMAL LOW (ref 3.5–5.1)
SODIUM: 140 mmol/L (ref 135–145)
Total Bilirubin: 1 mg/dL (ref 0.3–1.2)
Total Protein: 6.1 g/dL — ABNORMAL LOW (ref 6.5–8.1)

## 2015-12-23 LAB — BASIC METABOLIC PANEL
BUN: 12 mg/dL (ref 4–21)
CREATININE: 0.8 mg/dL (ref 0.5–1.1)
GLUCOSE: 81 mg/dL
POTASSIUM: 3.3 mmol/L — AB (ref 3.4–5.3)
SODIUM: 140 mmol/L (ref 137–147)

## 2015-12-23 LAB — CK: Total CK: 608 U/L — ABNORMAL HIGH (ref 38–234)

## 2015-12-23 LAB — HEPATIC FUNCTION PANEL: BILIRUBIN, TOTAL: 1 mg/dL

## 2015-12-23 LAB — TSH: TSH: 0.69 u[IU]/mL (ref 0.350–4.500)

## 2015-12-23 LAB — CBC AND DIFFERENTIAL: WBC: 6.9 10^3/mL

## 2015-12-23 MED ORDER — ZOLPIDEM TARTRATE 5 MG PO TABS
5.0000 mg | ORAL_TABLET | Freq: Once | ORAL | Status: AC
  Administered 2015-12-23: 5 mg via ORAL
  Filled 2015-12-23: qty 1

## 2015-12-23 MED ORDER — SODIUM CHLORIDE 0.9 % IV SOLN
INTRAVENOUS | Status: DC
  Administered 2015-12-23 – 2015-12-25 (×6): via INTRAVENOUS

## 2015-12-23 MED ORDER — POTASSIUM CHLORIDE CRYS ER 20 MEQ PO TBCR
40.0000 meq | EXTENDED_RELEASE_TABLET | Freq: Once | ORAL | Status: AC
  Administered 2015-12-23: 40 meq via ORAL
  Filled 2015-12-23: qty 2

## 2015-12-23 NOTE — Progress Notes (Signed)
PROGRESS NOTE  Angela Keith ZOX:[id number] DOB: 1930-04-03 DOA: 12/22/2015 PCP: Angela Dubonnet, MD  HPI/Recap of past 24 hours: Angela Keith is a 82 y.o. female,with Hypertension, Hyperlipidemia, Hypothyroidism, CVA, with residual left sided weakness,had a fall at home in the bathroom. The patient is a poor historian and cannot provide any information about this fall. Pt was brought to ED for evaluation by family and w/up showed cpk elevation. She has been admitted for rhabdomyolysis.  Resting comfortably in her room this AM, she has no complaints of pain, nausea, fevers, etc.  Assessment/Plan: Active Problems:   Rhabdomyolysis   Pressure ulcer  1. Rhabdo Ns iv,  Serial cpk, improving but still elevated.  2. Pafib Cont eliquis  3. CVA Check lipids  4. Hypokalemia repeleted on admission, will redose this AM   5. Hypothyroidism  Tsh normal  Code Status: FULL   Family Communication: None present this AM.   Disposition Plan: Likely to home in 24 hours.    Consultants:  None   Procedures:  None   Antimicrobials:  None    Objective: Filed Vitals:   12/22/15 1829 12/22/15 2032 12/22/15 2045 12/23/15 0610  BP: 155/83 148/84 152/79 137/61  Pulse: 77 81 71 57  Temp:  98.2 F (36.8 C) 98.3 F (36.8 C) 97.5 F (36.4 C)  TempSrc:  Oral Oral Oral  Resp: 22 20 18 18   Height:   5\' 4"  (1.626 m)   Weight:   57.199 kg (126 lb 1.6 oz) 58.287 kg (128 lb 8 oz)  SpO2: 96% 94% 98% 98%    Intake/Output Summary (Last 24 hours) at 12/23/15 1051 Last data filed at 12/23/15 0945  Gross per 24 hour  Intake 1196.67 ml  Output    500 ml  Net 696.67 ml   Filed Weights   12/22/15 1550 12/22/15 2045 12/23/15 0610  Weight: 65.772 kg (145 lb) 57.199 kg (126 lb 1.6 oz) 58.287 kg (128 lb 8 oz)    Exam: General:  Alert, oriented to self and place, calm, in no acute distress Eyes: pupils round and reactive to light and accomodation, clear sclerea Neck:  supple, no masses, trachea mildline  Cardiovascular: RRR, no murmurs or rubs, no peripheral edema  Respiratory: clear to auscultation bilaterally, no wheezes, no crackles  Abdomen: soft, nontender, nondistended, normal bowel tones heard  Skin: dry, no rashes  Musculoskeletal: no joint effusions, normal range of motion  Psychiatric: appropriate affect, normal speech  Neurologic: extraocular muscles intact, clear speech, moving all extremities with intact sensorium    Data Reviewed: CBC:  Recent Labs Lab 12/22/15 1654 12/23/15 0516  WBC 9.4 6.9  NEUTROABS 6.9  --   HGB 13.2 11.9*  HCT 40.3 36.0  MCV 87.2 86.1  PLT 283 243   Basic Metabolic Panel:  Recent Labs Lab 12/22/15 1654 12/23/15 0516  NA 141 140  K 3.4* 3.3*  CL 105 107  CO2 30 28  GLUCOSE 94 81  BUN 11 12  CREATININE 0.70 0.80  CALCIUM 9.4 8.8*   GFR: Estimated Creatinine Clearance: 46 mL/min (by C-G formula based on Cr of 0.8). Liver Function Tests:  Recent Labs Lab 12/22/15 1654 12/23/15 0516  AST 42* 33  ALT 23 19  ALKPHOS 62 51  BILITOT 0.8 1.0  PROT 6.9 6.1*  ALBUMIN 4.1 3.4*   No results for input(s): LIPASE, AMYLASE in the last 168 hours. No results for input(s): AMMONIA in the last 168 hours. Coagulation Profile: No results for input(s): INR,  PROTIME in the last 168 hours. Cardiac Enzymes:  Recent Labs Lab 12/22/15 1654 12/23/15 0516  CKTOTAL 1107* 608*   BNP (last 3 results) No results for input(s): PROBNP in the last 8760 hours. HbA1C: No results for input(s): HGBA1C in the last 72 hours. CBG: No results for input(s): GLUCAP in the last 168 hours. Lipid Profile: No results for input(s): CHOL, HDL, LDLCALC, TRIG, CHOLHDL, LDLDIRECT in the last 72 hours. Thyroid Function Tests:  Recent Labs  12/23/15 0516  TSH 0.690   Anemia Panel: No results for input(s): VITAMINB12, FOLATE, FERRITIN, TIBC, IRON, RETICCTPCT in the last 72 hours. Urine analysis:    Component Value  Date/Time   COLORURINE YELLOW 12/22/2015 1648   APPEARANCEUR CLEAR 12/22/2015 1648   LABSPEC 1.012 12/22/2015 1648   PHURINE 7.5 12/22/2015 1648   GLUCOSEU NEGATIVE 12/22/2015 1648   HGBUR TRACE* 12/22/2015 1648   BILIRUBINUR NEGATIVE 12/22/2015 1648   KETONESUR NEGATIVE 12/22/2015 1648   PROTEINUR NEGATIVE 12/22/2015 1648   UROBILINOGEN 1.0 01/25/2015 0547   NITRITE NEGATIVE 12/22/2015 1648   LEUKOCYTESUR NEGATIVE 12/22/2015 1648   Sepsis Labs: (procalcitonin:4,lacticidven:4)  )No results found for this or any previous visit (from the past 240 hour(s)).    Studies: Dg Lumbar Spine Complete  12/22/2015  CLINICAL DATA:  Low back pain after fall last night. EXAM: LUMBAR SPINE - COMPLETE 4+ VIEW COMPARISON:  Radiographs of December 12, 2014. FINDINGS: Moderate dextroscoliosis of lumbar spine is noted. Status post surgical posterior fusion of L4-5 with bilateral intrapedicular screw placement. No fracture or spondylolisthesis is noted. Moderate degenerative disc disease is noted at L1-2 and L2-3. Atherosclerosis of abdominal aorta is noted. IMPRESSION: Postsurgical and degenerative changes as described above. No acute abnormality seen in the lumbar spine. Electronically Signed   By: Lupita Raider, M.D.   On: 12/22/2015 17:56   Dg Pelvis 1-2 Views  12/22/2015  CLINICAL DATA:  Fall in bathtub last night with low back pain as well as sacral and buttock pain. EXAM: PELVIS - 1-2 VIEW COMPARISON:  CT 10/30/2012 FINDINGS: Examination demonstrates mild diffuse decreased bone mineralization. Fusion hardware is present over the lower lumbar spine intact. There are degenerative changes of the spine and mild degenerative changes of the hips. No acute fracture or dislocation. IMPRESSION: No acute findings. Electronically Signed   By: Elberta Fortis M.D.   On: 12/22/2015 18:03   Dg Shoulder Right  12/22/2015  CLINICAL DATA:  Right shoulder pain after fall at home last night. Initial encounter. EXAM:  RIGHT SHOULDER - 2+ VIEW COMPARISON:  None. FINDINGS: There is no evidence of fracture or dislocation. There is no evidence of arthropathy or other focal bone abnormality. Soft tissues are unremarkable. IMPRESSION: Normal right shoulder. Electronically Signed   By: Lupita Raider, M.D.   On: 12/22/2015 19:36   Ct Head Wo Contrast  12/22/2015  CLINICAL DATA:  Fall at home last night. Denies loss of consciousness. EXAM: CT HEAD WITHOUT CONTRAST TECHNIQUE: Contiguous axial images were obtained from the base of the skull through the vertex without intravenous contrast. COMPARISON:  CT and MRI scan of January 18, 2015. FINDINGS: Bony calvarium appears intact. Encephalomalacia of the right ACA territory is noted consistent with old infarction. Stable lacunar infarction in left basal ganglia is noted. No mass effect or midline shift is noted. Ventricular size is within normal limits. There is no evidence of mass lesion, hemorrhage or acute infarction. IMPRESSION: Old right ACA infarction as described above. Old lacunar infarction in left  basal ganglia. No acute intracranial abnormality seen. Electronically Signed   By: Lupita RaiderJames  Green Jr, M.D.   On: 12/22/2015 18:03    Scheduled Meds: . apixaban  5 mg Oral BID  . baclofen  10 mg Oral TID  . dorzolamide-timolol  1 drop Both Eyes BID  . gabapentin  300 mg Oral QHS  . latanoprost  1 drop Both Eyes QHS  . levothyroxine  88 mcg Oral QAC breakfast  . lidocaine  1 patch Transdermal Q24H  . metoprolol tartrate  25 mg Oral BID  . mirtazapine  7.5 mg Oral QHS  . pantoprazole  40 mg Oral BID  . sodium chloride flush  3 mL Intravenous Q12H  . tamsulosin  0.4 mg Oral Daily  . traZODone  25 mg Oral QHS  . vitamin B-12  1,000 mcg Oral Daily    Continuous Infusions: . 0.9 % NaCl with KCl 20 mEq / L 100 mL/hr at 12/22/15 2126       Time spent: 25 minutes  Mir Vergie LivingMohammed Ikramullah, MD [facility] Pager [phone]  If 7PM-7AM, please contact  night-coverage [email or web address] Password [email or web address] 12/23/2015, 10:51 AM

## 2015-12-23 NOTE — Progress Notes (Signed)
Patient did not void this shift, Bladder scan shows 320cc. NP on call made aware and order for in&out cath received. Patient was catherized  and 300cc of clear yellow urine obtain. Tolerated well..Marland Kitchen

## 2015-12-24 LAB — BASIC METABOLIC PANEL
Anion gap: 4 — ABNORMAL LOW (ref 5–15)
BUN: 11 mg/dL (ref 4–21)
BUN: 11 mg/dL (ref 6–20)
CALCIUM: 8.5 mg/dL — AB (ref 8.9–10.3)
CO2: 28 mmol/L (ref 22–32)
CREATININE: 0.78 mg/dL (ref 0.44–1.00)
CREATININE: 0.8 mg/dL (ref 0.5–1.1)
Chloride: 108 mmol/L (ref 101–111)
GFR calc non Af Amer: >60 mL/min (ref >60)
GLUCOSE: 129 mg/dL — AB (ref 65–99)
Glucose: 129 mg/dL
Potassium: 3.7 mmol/L (ref 3.5–5.1)
Sodium: 140 mmol/L (ref 135–145)
Sodium: 140 mmol/L (ref 137–147)

## 2015-12-24 LAB — CK: Total CK: 284 U/L — ABNORMAL HIGH (ref 38–234)

## 2015-12-24 MED ORDER — VITAMINS A & D EX OINT
TOPICAL_OINTMENT | CUTANEOUS | Status: AC
  Filled 2015-12-24: qty 5

## 2015-12-24 NOTE — Progress Notes (Signed)
Physical Therapy Treatment Patient Details Name: Angela PavlovRachel E Keith MRN: [id number] DOB: Nov 09, 1929 Today's Date: 12/24/2015    History of Present Illness Angela GamesRachel Keith is a 82 y.o. female, W/ Hypertension, Hyperlipidemia, Hypothyroidism, CVA, with residual left sided weakness, Apparently c/o fall at home while getting up from the toilet , into the bathtub . The patient is a poor historian and it is uncertain when the fall occurred, possibly about 8:30am this am vs 8: 30pm last nite. Pt was brought to ED for evaluation by family and w/up showed cpk elevation. Pt admitted 12/22/15 for mild rhabdomyolysis; PMHx:  HTN,  lumbar lam    PT Comments    Pt may benefit from SNF depending on insurance/SNF days available;  dtr unsure if she will be able to provide assist that pt needs--would need 24hr assist at her current status;  also feel pt and family have somewhat unrealistic goals  regarding pt ability to return to I lifestyle and live alone long term;  Recommend HHPT if SNF is not an option, pt was getting HHPT but had been D/C'd form OT prior to adm; will continue to follow and assess for needs in acute setting   Follow Up Recommendations  SNF;Supervision/Assistance - 24 hour     Equipment Recommendations  None recommended by PT    Recommendations for Other Services       Precautions / Restrictions Precautions Precautions: Fall Restrictions Weight Bearing Restrictions: No    Mobility  Bed Mobility Overal bed mobility: Needs Assistance Bed Mobility: Supine to Sit     Supine to sit: Min guard     General bed mobility comments: incr time, pt uses RLE to assist LLE  Transfers Overall transfer level: Needs assistance Equipment used: 1 person hand held assist Transfers: Sit to/from [employer]GI CorporationStand;Stand Pivot Transfers Sit to Stand: Min assist Stand pivot transfers: Min assist;Mod assist       General transfer comment: tactile cues/facilitation to extend L knee and WB LLE; assist for  balance to pivot to chair to pt right side  Ambulation/Gait Ambulation/Gait assistance:  (NT-non amb at home)               Stairs            Wheelchair Mobility    Modified Rankin (Stroke Patients Only)       Balance Overall balance assessment: Needs assistance;History of Falls Sitting-balance support: Feet supported;Single extremity supported;No upper extremity supported Sitting balance-Leahy Scale: Fair     Standing balance support: Single extremity supported;During functional activity Standing balance-Leahy Scale: Poor                      Cognition Arousal/Alertness: Awake/alert Behavior During Therapy: WFL for tasks assessed/performed Overall Cognitive Status: Difficult to assess                      Exercises      General Comments        Pertinent Vitals/Pain Pain Assessment: No/denies pain    Home Living Family/patient expects to be discharged to:: Private residence Living Arrangements: Alone   Type of Home: House Home Access: Ramped entrance   Home Layout: Two level;Able to live on main level with bedroom/bathroom        Prior Function Level of Independence: Independent with assistive device(s);Needs assistance  Gait / Transfers Assistance Needed: only amb with HHPT; otherwise uses w/c, transfers I'ly ADL's / Homemaking Assistance Needed: assistance with shower; sponge bathes--fatigues easily Comments:  daughter  lives next door   PT Goals (current goals can now be found in the care plan section) Acute Rehab PT Goals Patient Stated Goal: get better, more Ind PT Goal Formulation: With patient/family Time For Goal Achievement: 12/31/15 Potential to Achieve Goals: Good Progress towards PT goals: Progressing toward goals    Frequency  Min 3X/week    PT Plan Current plan remains appropriate;Discharge plan needs to be updated    Co-evaluation             End of Session Equipment Utilized During Treatment: Gait  belt Activity Tolerance: Patient tolerated treatment well;Patient limited by fatigue Patient left: in chair;with call bell/phone within reach;with chair alarm set;with family/visitor present     Time: 1610-9604 PT Time Calculation (min) (ACUTE ONLY): 20 min  Charges:  $Therapeutic Activity: 8-22 mins                    G Codes:      Eddye Broxterman 09-Jan-2016, 3:27 PM

## 2015-12-24 NOTE — Progress Notes (Signed)
PROGRESS NOTE  Angela Keith:[id number] DOB: 1930-01-19 DOA: 12/22/2015 PCP: [name], MD  HPI/Recap of past 24 hours: Angela Keith is a 82 y.o. female,with Hypertension, Hyperlipidemia, Hypothyroidism, CVA, with residual left sided weakness,had a fall at home in the bathroom. The patient is a poor historian and cannot provide any information about this fall. Pt was brought to ED for evaluation by family and w/up showed cpk elevation. She has been admitted for rhabdomyolysis.  Resting comfortably in her room this AM, she has no complaints. Daughter present, we spoke a couple of times. She requests evaluation for possible SNF placement.  Assessment/Plan: Active Problems:   Rhabdomyolysis   Pressure ulcer  1. Rhabdo Almost resolved, cont IVF today. Recheck CK in Am. Serial cpk, improving but still elevated.  2. Pafib Cont eliquis  3. CVA Check lipids  4. Hypokalemia repeleted on admission, will redose this AM   5. Hypothyroidism  Tsh normal  Code Status: FULL   Family Communication: d/w daughter in room this AM.  Disposition Plan: Likely to SNF soon, PT consult pending today.   Consultants:  None   Procedures:  None   Antimicrobials:  None    Objective: Filed Vitals:   12/23/15 2125 12/23/15 2130 12/24/15 0452 12/24/15 1148  BP:  134/87 154/73 163/73  Pulse: 56 63 53 51  Temp:   98 F (36.7 C)   TempSrc:   Oral   Resp:   16   Height:      Weight:   60.283 kg (132 lb 14.4 oz)   SpO2:   97%     Intake/Output Summary (Last 24 hours) at 12/24/15 1217 Last data filed at 12/24/15 1120  Gross per 24 hour  Intake 2361.67 ml  Output   3828 ml  Net -1466.33 ml   Filed Weights   12/22/15 2045 12/23/15 0610 12/24/15 0452  Weight: 57.199 kg (126 lb 1.6 oz) 58.287 kg (128 lb 8 oz) 60.283 kg (132 lb 14.4 oz)    Exam: General:  Alert, oriented to self and place, calm, in no acute distress Eyes: pupils round and reactive to light  and accomodation, clear sclerea Neck: supple, no masses, trachea mildline  Cardiovascular: RRR, no murmurs or rubs, no peripheral edema  Respiratory: clear to auscultation bilaterally, no wheezes, no crackles  Abdomen: soft, nontender, nondistended, normal bowel tones heard  Skin: dry, no rashes  Musculoskeletal: no joint effusions, normal range of motion  Psychiatric: appropriate affect, normal speech  Neurologic: extraocular muscles intact, clear speech, moving all extremities with intact sensorium    Data Reviewed: CBC:  Recent Labs Lab 12/22/15 1654 12/23/15 0516  WBC 9.4 6.9  NEUTROABS 6.9  --   HGB 13.2 11.9*  HCT 40.3 36.0  MCV 87.2 86.1  PLT 283 243   Basic Metabolic Panel:  Recent Labs Lab 12/22/15 1654 12/23/15 0516 12/24/15 1025  NA 141 140 140  K 3.4* 3.3* 3.7  CL 105 107 108  CO2 GLUCOSE 94 81 129*  BUN CREATININE 0.70 0.80 0.78  CALCIUM 9.4 8.8* 8.5*   GFR: Estimated Creatinine Clearance: 46 mL/min (by C-G formula based on Cr of 0.78). Liver Function Tests:  Recent Labs Lab 12/22/15 1654 12/23/15 0516  AST 42* 33  ALT 23 19  ALKPHOS 62 51  BILITOT 0.8 1.0  PROT 6.9 6.1*  ALBUMIN 4.1 3.4*   No results for input(s): LIPASE, AMYLASE in the last 168 hours. No results  for input(s): AMMONIA in the last 168 hours. Coagulation Profile: No results for input(s): INR, PROTIME in the last 168 hours. Cardiac Enzymes:  Recent Labs Lab 12/22/15 1654 12/23/15 0516 12/24/15 1025  CKTOTAL 1107* 608* 284*   BNP (last 3 results) No results for input(s): PROBNP in the last 8760 hours. HbA1C: No results for input(s): HGBA1C in the last 72 hours. CBG: No results for input(s): GLUCAP in the last 168 hours. Lipid Profile: No results for input(s): CHOL, HDL, LDLCALC, TRIG, CHOLHDL, LDLDIRECT in the last 72 hours. Thyroid Function Tests:  Recent Labs  12/23/15 0516  TSH 0.690   Anemia Panel: No results for input(s):  VITAMINB12, FOLATE, FERRITIN, TIBC, IRON, RETICCTPCT in the last 72 hours. Urine analysis:    Component Value Date/Time   COLORURINE YELLOW 12/22/2015 1648   APPEARANCEUR CLEAR 12/22/2015 1648   LABSPEC 1.012 12/22/2015 1648   PHURINE 7.5 12/22/2015 1648   GLUCOSEU NEGATIVE 12/22/2015 1648   HGBUR TRACE* 12/22/2015 1648   BILIRUBINUR NEGATIVE 12/22/2015 1648   KETONESUR NEGATIVE 12/22/2015 1648   PROTEINUR NEGATIVE 12/22/2015 1648   UROBILINOGEN 1.0 01/25/2015 0547   NITRITE NEGATIVE 12/22/2015 1648   LEUKOCYTESUR NEGATIVE 12/22/2015 1648   Sepsis Labs: @LABRCNTIP (procalcitonin:4,lacticidven:4)  )No results found for this or any previous visit (from the past 240 hour(s)).    Studies: No results found.  Scheduled Meds: . apixaban  5 mg Oral BID  . baclofen  10 mg Oral TID  . dorzolamide-timolol  1 drop Both Eyes BID  . gabapentin  300 mg Oral QHS  . latanoprost  1 drop Both Eyes QHS  . levothyroxine  88 mcg Oral QAC breakfast  . lidocaine  1 patch Transdermal Q24H  . metoprolol tartrate  25 mg Oral BID  . mirtazapine  7.5 mg Oral QHS  . pantoprazole  40 mg Oral BID  . sodium chloride flush  3 mL Intravenous Q12H  . tamsulosin  0.4 mg Oral Daily  . traZODone  25 mg Oral QHS  . vitamin B-12  1,000 mcg Oral Daily    Continuous Infusions: . sodium chloride 100 mL/hr at 12/24/15 1139     LOS: 1 day   Time spent: 23 minutes  Chevez Sambrano Vergie LivingMohammed Jermany Sundell, MD [facility] Pager [phone]  If 7PM-7AM, please contact night-coverage [email or web address] Password [email or web address] 12/24/2015, 12:17 PM

## 2015-12-24 NOTE — Evaluation (Signed)
Physical Therapy Evaluation Patient Details Name: Angela Keith MRN: [id number] DOB: [birth year] Today's Date: 12/24/2015   History of Present Illness  Angela Keith is a 82 y.o. female, W/ Hypertension, Hyperlipidemia, Hypothyroidism, CVA, with residual left sided weakness, Apparently c/o fall at home while getting up from the toilet , into the bathtub . The patient is a poor historian and it is uncertain when the fall occurred, possibly about 8:30am this am vs 8: 30pm last nite. Pt was brought to ED for evaluation by family and w/up showed cpk elevation. Pt admitted 12/22/15 for mild rhabdomyolysis; PMHx:  HTN,  lumbar lam  Clinical Impression  Pt admitted with above diagnosis. Pt currently with functional limitations due to the deficits listed below (see PT Problem List).  Pt will benefit from skilled PT to increase their independence and safety with mobility to allow discharge to the venue listed below.   eval limited d/t RN interrupted session and  needed to give pt meds; will attempt to see again later today for incr  mobility and to better determine post acute needs     Follow Up Recommendations Home health PT;SNF (vs)    Equipment Recommendations  None recommended by PT    Recommendations for Other Services       Precautions / Restrictions Precautions Precautions: Fall Restrictions Weight Bearing Restrictions: No      Mobility  Bed Mobility    NT at this time/bed eval only              Transfers                    Ambulation/Gait                Stairs            Wheelchair Mobility    Modified Rankin (Stroke Patients Only)       Balance                                             Pertinent Vitals/Pain Pain Assessment: No/denies pain    Home Living Family/patient expects to be discharged to:: Private residence Living Arrangements: Alone   Type of Home: House Home Access: Ramped entrance     Home  Layout: Two level;Able to live on main level with bedroom/bathroom        Prior Function Level of Independence: Independent with assistive device(s);Needs assistance   Gait / Transfers Assistance Needed: only amb with HHPT; otherwise uses w/c, transfers I'ly  ADL's / Homemaking Assistance Needed: assistance with shower; sponge bathes--fatigues easily  Comments: daughter  lives next door     Hand Dominance        Extremity/Trunk Assessment   Upper Extremity Assessment: Generalized weakness           Lower Extremity Assessment: Generalized weakness      Cervical / Trunk Assessment: Normal  Communication   Communication: HOH  Cognition Arousal/Alertness: Awake/alert Behavior During Therapy: WFL for tasks assessed/performed Overall Cognitive Status: Impaired/Different from baseline (chart states pt wtih decr memory)                      General Comments      Exercises        Assessment/Plan    PT Assessment Patient needs continued PT services  PT Diagnosis Generalized weakness  PT Problem List Decreased strength;Decreased activity tolerance;Decreased balance;Decreased mobility  PT Treatment Interventions DME instruction;Gait training;Functional mobility training;Therapeutic activities;Therapeutic exercise;Patient/family education   PT Goals (Current goals can be found in the Care Plan section) Acute Rehab PT Goals Patient Stated Goal: get better, more Ind PT Goal Formulation: With patient Time For Goal Achievement: 12/31/15 Potential to Achieve Goals: Good    Frequency Min 3X/week   Barriers to discharge        Co-evaluation               End of Session   Activity Tolerance: Other (comment) (limited to bed eval d/t interrrupted by RN) Patient left: in bed;with call bell/phone within reach;with family/visitor present           Time: 1132-1140 PT Time Calculation (min) (ACUTE ONLY): 8 min   Charges:   PT Evaluation $PT Eval  Low Complexity: 1 Procedure     PT G Codes:        Angela Keith 2016-01-20, 1:07 PM

## 2015-12-25 DIAGNOSIS — T796XXA Traumatic ischemia of muscle, initial encounter: Principal | ICD-10-CM

## 2015-12-25 NOTE — Progress Notes (Signed)
Physical Therapy Treatment Patient Details Name: Angela Keith MRN: [id number] DOB: 05-04-1930 Today's Date: 12/25/2015    History of Present Illness Angela Keith is a 82 y.o. female, W/ Hypertension, Hyperlipidemia, Hypothyroidism, CVA, with residual left sided weakness, Apparently c/o fall at home while getting up from the toilet , into the bathtub . The patient is a poor historian and it is uncertain when the fall occurred, possibly about 8:30am this am vs 8: 30pm last nite. Pt was brought to ED for evaluation by family and w/up showed cpk elevation. Pt admitted 12/22/15 for mild rhabdomyolysis; PMHx:  HTN,  lumbar lam    PT Comments    Pt progressing today, improved overall , still not at her baseline of being independent with transfers and standing for short periods; dtr and pt still hopeful for SNF; feel pt would benefit from SNF post acute prior to transition home; discussed with pt and dtr; will follow   Follow Up Recommendations  SNF;Supervision/Assistance - 24 hour     Equipment Recommendations  None recommended by PT    Recommendations for Other Services       Precautions / Restrictions Precautions Precautions: Fall    Mobility  Bed Mobility Overal bed mobility: Needs Assistance Bed Mobility: Supine to Sit     Supine to sit: Min guard;Supervision     General bed mobility comments: incr time, pt uses RLE to assist LLE, min/guard-supervision for safety; bed flat  Transfers Overall transfer level: Needs assistance Equipment used: 1 person hand held assist Transfers: Sit to/from [employer] Sit to Stand: Min assist Stand pivot transfers: Min assist;Min guard       General transfer comment: cues for hand placement, technique, safety; tactile input for L extension knee during sit to stand and stand pivot;(sit to stand repeated for strengthening);   Ambulation/Gait                 Stairs            Wheelchair Mobility     Modified Rankin (Stroke Patients Only)       Balance     Sitting balance-Leahy Scale: Fair     Standing balance support: Single extremity supported;During functional activity Standing balance-Leahy Scale: Poor                      Cognition Arousal/Alertness: Awake/alert Behavior During Therapy: WFL for tasks assessed/performed Overall Cognitive Status: Within Functional Limits for tasks assessed                      Exercises General Exercises - Lower Extremity Quad Sets: AROM;Strengthening;Left;5 reps (encouraged pt to repeat during day)    General Comments        Pertinent Vitals/Pain Pain Assessment: No/denies pain    Home Living                      Prior Function            PT Goals (current goals can now be found in the care plan section) Acute Rehab PT Goals Patient Stated Goal: get better, more Ind PT Goal Formulation: With patient/family Time For Goal Achievement: 12/31/15 Potential to Achieve Goals: Good Progress towards PT goals: Progressing toward goals    Frequency  Min 3X/week    PT Plan Current plan remains appropriate    Co-evaluation             End of Session  Equipment Utilized During Treatment: Gait belt Activity Tolerance: Patient tolerated treatment well Patient left: in chair;with call bell/phone within reach;with chair alarm set;with family/visitor present     Time: 1206-1230 PT Time Calculation (min) (ACUTE ONLY): 24 min  Charges:  $Therapeutic Activity: 23-37 mins                    G Codes:      Angela Keith 12/25/2015, 12:44 PM

## 2015-12-25 NOTE — NC FL2 (Signed)
[place] MEDICAID FL2 LEVEL OF CARE SCREENING TOOL     IDENTIFICATION  Patient Name: Angela Keith Birthdate: 09-20-1929 Sex: female Admission Date (Current Location): 12/22/2015  [facility] and [place] Number:  Producer, television/film/video and Address:  [facility],  [address] [place], [place] [address]      Provider Number: [id number]  Attending Physician Name and Address:  Standley Brooking, MD  Relative Name and Phone Number:       Current Level of Care: Hospital Recommended Level of Care: Skilled Nursing Facility Prior Approval Number:    Date Approved/Denied:   PASRR Number: [id number]  Discharge Plan: Home    Current Diagnoses: Patient Active Problem List   Diagnosis Date Noted  . Pressure ulcer 12/23/2015  . Rhabdomyolysis 12/22/2015  . Atrial fibrillation (HCC) 06/06/2015  . Embolic stroke (HCC) 03/22/2015  . Left hemiparesis (HCC)   . Cryptogenic stroke (HCC)   . H/O TIA (transient ischemic attack) and stroke   . Spinal stenosis, lumbar region, with neurogenic claudication 01/17/2015  . Lumbar stenosis 01/17/2015  . 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 Essential hypertension 11/10/2014  . HLD (hyperlipidemia) 11/10/2014  . Back pain with left-sided radiculopathy 11/08/2014  . Cerebral infarction due to thrombosis of cerebral artery (HCC)   . Hyperlipidemia LDL goal <70 09/07/2014  . Presumed CVA (cerebral infarction) s/p IV tPA, MRI negative 09/05/2014  . Hypertensive urgency 09/05/2014  . Right bundle branch block 03/21/2014  . Cough 10/31/2012  . Nausea vomiting and diarrhea 10/30/2012  . Elevated lipase 10/30/2012  . HTN (hypertension) 10/30/2012  . Hypothyroidism 10/30/2012    Orientation RESPIRATION BLADDER Height & Weight     Self, Time, Situation, Place  Normal Continent Weight: 130 lb 8 oz (59.194 kg) Height:   (162.6 cm)  BEHAVIORAL SYMPTOMS/MOOD NEUROLOGICAL BOWEL NUTRITION STATUS      Continent Diet (Heart  Healthy)  AMBULATORY STATUS COMMUNICATION OF NEEDS Skin   Extensive Assist Verbally PU Stage and Appropriate Care (PressureUlcer06/16/17StageI-Intactskinwithnon-blanchablerednessofalocalizedareausuallyoverabonyprominence.)                       Personal Care Assistance Level of Assistance  Bathing, Dressing Bathing Assistance: Limited assistance   Dressing Assistance: Limited assistance     Functional Limitations Info  Hearing   Hearing Info: Impaired      SPECIAL CARE FACTORS FREQUENCY  PT (By licensed PT), OT (By licensed OT)     PT Frequency: 5 OT Frequency: 5            Contractures      Additional Factors Info  Code Status, Allergies Code Status Info: Fullcode Allergies Info: Allergies:  Ace Inhibitors, Lipitor, Motrin           Current Medications (12/25/2015):  This is the current hospital active medication list Current Facility-Administered Medications  Medication Dose Route Frequency Provider Last Rate Last Dose  . 0.9 %  sodium chloride infusion   Intravenous Continuous Mir Vergie Living, MD 100 mL/hr at 12/25/15 0743    . acetaminophen (TYLENOL) tablet 650 mg  650 mg Oral Q6H PRN Pearson Grippe, MD   650 mg at 12/24/15 1141   Or  . acetaminophen (TYLENOL) suppository 650 mg  650 mg Rectal Q6H PRN Pearson Grippe, MD      . apixaban Everlene Balls) tablet 5 mg  5 mg Oral BID Pearson Grippe, MD   5 mg at 12/25/15 1202  . baclofen (LIORESAL) tablet 10 mg  10 mg  Oral TID Pearson GrippeJames Kim, MD   10 mg at 12/25/15 1203  . dorzolamide-timolol (COSOPT) 22.3-6.8 MG/ML ophthalmic solution 1 drop  1 drop Both Eyes BID Pearson GrippeJames Kim, MD   1 drop at 12/25/15 0908  . gabapentin (NEURONTIN) capsule 300 mg  300 mg Oral QHS Pearson GrippeJames Kim, MD   300 mg at 12/24/15 2134  . latanoprost (XALATAN) 0.005 % ophthalmic solution 1 drop  1 drop Both Eyes QHS Pearson GrippeJames Kim, MD   1 drop at 12/24/15 2134  . levothyroxine (SYNTHROID, LEVOTHROID) tablet 88 mcg  88 mcg Oral QAC breakfast Pearson GrippeJames Kim,  MD   88 mcg at 12/25/15 1203  . lidocaine (LIDODERM) 5 % 1 patch  1 patch Transdermal Q24H Pearson GrippeJames Kim, MD   1 patch at 12/25/15 1204  . loratadine (CLARITIN) tablet 10 mg  10 mg Oral Daily PRN Pearson GrippeJames Kim, MD      . metoprolol tartrate (LOPRESSOR) tablet 25 mg  25 mg Oral BID Pearson GrippeJames Kim, MD   25 mg at 12/25/15 1203  . mirtazapine (REMERON) tablet 7.5 mg  7.5 mg Oral QHS Pearson GrippeJames Kim, MD   7.5 mg at 12/24/15 2121  . pantoprazole (PROTONIX) EC tablet 40 mg  40 mg Oral BID Pearson GrippeJames Kim, MD   40 mg at 12/25/15 1203  . sodium chloride flush (NS) 0.9 % injection 3 mL  3 mL Intravenous Q12H Pearson GrippeJames Kim, MD   3 mL at 12/22/15 2337  . tamsulosin (FLOMAX) capsule 0.4 mg  0.4 mg Oral Daily Pearson GrippeJames Kim, MD   0.4 mg at 12/25/15 1202  . traZODone (DESYREL) tablet 25 mg  25 mg Oral QHS Pearson GrippeJames Kim, MD   25 mg at 12/24/15 2122  . vitamin B-12 (CYANOCOBALAMIN) tablet 1,000 mcg  1,000 mcg Oral Daily Pearson GrippeJames Kim, MD   1,000 mcg at 12/25/15 1204     Discharge Medications: Please see discharge summary for a list of discharge medications.  Relevant Imaging Results:  Relevant Lab Results:   Additional Information SSN: [id number]  [name], [name], LCSW

## 2015-12-25 NOTE — Progress Notes (Addendum)
  PROGRESS NOTE  Angela PavlovRachel E Keith ZOX:[id number] DOB: 23-Jun-1930 DOA: 12/22/2015 PCP: Pearla DubonnetGATES,[name], MD  Brief Narrative: 82 year old woman lives alone, history of stroke with residual left-sided weakness. Fell in shower and was down approximate 16 hours prior to being found at home.  Assessment/Plan: 1. Rhabdomyolysis secondary to fall at home, prolonged immobility. Renal function preserved. Total CK was trending down. 2. PMH stroke with residual left-sided weakness. Appears stable. 3. Atrial fibrillation, RBBB, on Eliquis.    Appears to be improving. Decrease IV fluids.  Anticipate transfer skilled nursing facility next 24 hours  DVT prophylaxis: Eliquis Code Status: full code Family Communication: daughter-in-law at bedside Disposition Plan: SNF  Brendia Sacksaniel Kirstine Jacquin, MD  [facility] Direct contact: [phone] --Via amion app OR  --[email or web address]; password [email or web address]  7PM-7AM contact night coverage as above 12/25/2015, 3:42 PM  LOS: 2 days   Consultants:    Procedures:    Antimicrobials:    HPI/Subjective: Currently no complaints. Breathing okay.  Objective: Filed Vitals:   12/24/15 2042 12/25/15 0424 12/25/15 1200 12/25/15 1420  BP: 147/72 153/74 157/68 143/70  Pulse: 64 55 49 53  Temp: 98.8 F (37.1 C) 97.8 F (36.6 C)  98 F (36.7 C)  TempSrc: Oral Oral  Oral  Resp: 16 16  16   Height:      Weight:  59.194 kg (130 lb 8 oz)    SpO2: 94% 99%  98%    Intake/Output Summary (Last 24 hours) at 12/25/15 1542 Last data filed at 12/25/15 1322  Gross per 24 hour  Intake    420 ml  Output   1800 ml  Net  -1380 ml     Filed Weights   12/23/15 0610 12/24/15 0452 12/25/15 0424  Weight: 58.287 kg (128 lb 8 oz) 60.283 kg (132 lb 14.4 oz) 59.194 kg (130 lb 8 oz)    Exam:    Constitutional:  . Appears calm and comfortable ENMT:  . Very hard of hearing  Respiratory:  . CTA bilaterally, no w/r/r.  . Respiratory effort normal. No retractions or  accessory muscle use Cardiovascular:  . RRR, no m/r/g . No LE extremity edema   Musculoskeletal:  o Left upper and left lower show any weakness 4/5,. Right upper right lower extremity strength is grossly normal. Psychiatric:  . judgement and insight appear normal . Mental status o Mood, affect appropriate  I have personally reviewed following labs and imaging studies:  No no data  Scheduled Meds: . apixaban  5 mg Oral BID  . baclofen  10 mg Oral TID  . dorzolamide-timolol  1 drop Both Eyes BID  . gabapentin  300 mg Oral QHS  . latanoprost  1 drop Both Eyes QHS  . levothyroxine  88 mcg Oral QAC breakfast  . lidocaine  1 patch Transdermal Q24H  . metoprolol tartrate  25 mg Oral BID  . mirtazapine  7.5 mg Oral QHS  . pantoprazole  40 mg Oral BID  . sodium chloride flush  3 mL Intravenous Q12H  . tamsulosin  0.4 mg Oral Daily  . traZODone  25 mg Oral QHS  . vitamin B-12  1,000 mcg Oral Daily   Continuous Infusions: . sodium chloride 100 mL/hr at 12/25/15 [id number]    Active Problems:   Rhabdomyolysis   Pressure ulcer   LOS: 2 days   Time spent 20 minutes

## 2015-12-25 NOTE — Clinical Social Work Placement (Signed)
   CLINICAL SOCIAL WORK PLACEMENT  NOTE  Date:  12/25/2015  Patient Details  Name: Angela PavlovRachel E Mannor MRN: [id number] Date of Birth: [birth year]  Clinical Social Work is seeking post-discharge placement for this patient at the Skilled  Nursing Facility level of care (*CSW will initial, date and re-position this form in  chart as items are completed):  Yes   Patient/family provided with [place] Clinical Social Work Department's list of facilities offering this level of care within the geographic area requested by the patient (or if unable, by the patient's family).  Yes   Patient/family informed of their freedom to choose among providers that offer the needed level of care, that participate in Medicare, Medicaid or managed care program needed by the patient, have an available bed and are willing to accept the patient.  Yes   Patient/family informed of [place]'s ownership interest in [facility] and [facility], as well as of the fact that they are under no obligation to receive care at these facilities.  PASRR submitted to EDS on       PASRR number received on       Existing PASRR number confirmed on 12/25/15     FL2 transmitted to all facilities in geographic area requested by pt/family on 12/25/15     FL2 transmitted to all facilities within larger geographic area on       Patient informed that his/her managed care company has contracts with or will negotiate with certain facilities, including the following:            Patient/family informed of bed offers received.  Patient chooses bed at       Physician recommends and patient chooses bed at      Patient to be transferred to   on  .  Patient to be transferred to facility by       Patient family notified on   of transfer.  Name of family member notified:        PHYSICIAN       Additional Comment:    _______________________________________________ [name], Mitzi Lilja F, LCSW 12/25/2015, 1:32 PM

## 2015-12-25 NOTE — Clinical Documentation Improvement (Signed)
Hospitalist  Please document query responses in the progress notes and discharge summary, not on the CDI BPA form in [facility]. Thank you!  CMS requires that an attending physician or advanced practice provider must document the Location and POA status of pressure ulcers.  Please document the Location and Present on Admission status for the patient's pressure ulcer:  - Stage 1 Sacral Ulcer, Present on Admission (POA)  - other location and POA status  - unable to clinically determine  Clinical Information: Stage 1 sacral ulcer, present on admission is documented in the Flowsheets section of CHL. "Pressure ulcer" is documented in the MD progress note dated 12/23/15 and subsequent progress notes  Please exercise your independent, professional judgment when responding. A specific answer is not anticipated or expected.   Thank You, Angela Ralphathy R Aleisa Howk  RN BSN CCDS [phone] Health Information Management [place]

## 2015-12-25 NOTE — Clinical Social Work Note (Signed)
Clinical Social Work Assessment  Patient Details  Name: Angela Keith MRN: [id number] Date of Birth: [birth year]  Date of referral:  12/25/15               Reason for consult:  Facility Placement                Permission sought to share information with:  [facility]cean[facility] Contact Representative Permission granted to share information::  Yes, Verbal Permission Granted  Name::        Agency::     Relationship::     Contact Information:     Housing/Transportation Living arrangements for the past 2 months:  Single Family Home Source of Information:  Patient, Adult Children Patient Interpreter Needed:  None Criminal Activity/Legal Involvement Pertinent to Current Situation/Hospitalization:  No - Comment as needed Significant Relationships:  Adult Children Lives with:  Self Do you feel safe going back to the place where you live?  No Need for family participation in patient care:  Yes (Comment)  Care giving concerns:  CSW reviewed PT evaluation recommending SNF.    Social Worker assessment / plan:  Patient & family at bedside agreeable with plan for SNF. Patient's daughter-in-law informed CSW that [facility] had [facility] but would prefer a closer facility.   Employment status:  Retired Health and safety inspectornsurance information:  Medicare PT Recommendations:  Skilled Nursing Facility Information / Referral to community resources:  Skilled Nursing Facility  Patient/Family's Response to care:  CSW sent information out to [facility] - patient & family live in [place] and believe [facility] closest - awaiting response back. Family also state that patient has other family in [place] and [place] - awaiting response back from them as well.   Patient/Family's Understanding of and Emotional Response to Diagnosis, Current Treatment, and Prognosis:    Emotional Assessment Appearance:    Attitude/Demeanor/Rapport:    Affect (typically observed):     Orientation:  Oriented to Self, Oriented to Place, Oriented to  Time, Oriented to Situation Alcohol / Substance use:    Psych involvement (Current and /or in the community):     Discharge Needs  Concerns to be addressed:    Readmission within the last 30 days:    Current discharge risk:    Barriers to Discharge:      [name], Zayd Bonet F, LCSW 12/25/2015, 1:29 PM

## 2015-12-26 DIAGNOSIS — T796XXA Traumatic ischemia of muscle, initial encounter: Secondary | ICD-10-CM | POA: Diagnosis not present

## 2015-12-26 DIAGNOSIS — I10 Essential (primary) hypertension: Secondary | ICD-10-CM | POA: Diagnosis not present

## 2015-12-26 DIAGNOSIS — Z9181 History of falling: Secondary | ICD-10-CM | POA: Diagnosis not present

## 2015-12-26 DIAGNOSIS — I69354 Hemiplegia and hemiparesis following cerebral infarction affecting left non-dominant side: Secondary | ICD-10-CM | POA: Diagnosis not present

## 2015-12-26 DIAGNOSIS — M6282 Rhabdomyolysis: Secondary | ICD-10-CM | POA: Diagnosis not present

## 2015-12-26 DIAGNOSIS — E038 Other specified hypothyroidism: Secondary | ICD-10-CM | POA: Diagnosis not present

## 2015-12-26 DIAGNOSIS — I160 Hypertensive urgency: Secondary | ICD-10-CM | POA: Diagnosis not present

## 2015-12-26 DIAGNOSIS — I639 Cerebral infarction, unspecified: Secondary | ICD-10-CM | POA: Diagnosis not present

## 2015-12-26 DIAGNOSIS — M4806 Spinal stenosis, lumbar region: Secondary | ICD-10-CM | POA: Diagnosis not present

## 2015-12-26 DIAGNOSIS — R2689 Other abnormalities of gait and mobility: Secondary | ICD-10-CM | POA: Diagnosis not present

## 2015-12-26 DIAGNOSIS — G8929 Other chronic pain: Secondary | ICD-10-CM | POA: Diagnosis not present

## 2015-12-26 DIAGNOSIS — F329 Major depressive disorder, single episode, unspecified: Secondary | ICD-10-CM | POA: Diagnosis not present

## 2015-12-26 DIAGNOSIS — I480 Paroxysmal atrial fibrillation: Secondary | ICD-10-CM | POA: Diagnosis not present

## 2015-12-26 DIAGNOSIS — E034 Atrophy of thyroid (acquired): Secondary | ICD-10-CM | POA: Diagnosis not present

## 2015-12-26 DIAGNOSIS — K219 Gastro-esophageal reflux disease without esophagitis: Secondary | ICD-10-CM | POA: Diagnosis not present

## 2015-12-26 DIAGNOSIS — I6330 Cerebral infarction due to thrombosis of unspecified cerebral artery: Secondary | ICD-10-CM | POA: Diagnosis not present

## 2015-12-26 DIAGNOSIS — T796XXD Traumatic ischemia of muscle, subsequent encounter: Secondary | ICD-10-CM | POA: Diagnosis not present

## 2015-12-26 DIAGNOSIS — G4700 Insomnia, unspecified: Secondary | ICD-10-CM | POA: Diagnosis not present

## 2015-12-26 DIAGNOSIS — M791 Myalgia: Secondary | ICD-10-CM | POA: Diagnosis not present

## 2015-12-26 DIAGNOSIS — M6281 Muscle weakness (generalized): Secondary | ICD-10-CM | POA: Diagnosis not present

## 2015-12-26 DIAGNOSIS — I4891 Unspecified atrial fibrillation: Secondary | ICD-10-CM | POA: Diagnosis not present

## 2015-12-26 NOTE — Clinical Social Work Placement (Signed)
Patient has a bed [facility]. CSW has completed FL2 & will continue to follow and assist with discharge when ready.    Lincoln MaxinKelly Sanay Belmar, LCSW [facility] Clinical Social Worker cell #: [phone]     CLINICAL SOCIAL WORK PLACEMENT  NOTE  Date:  12/26/2015  Patient Details  Name: Angela Keith MRN: [id number] Date of Birth: 31-Oct-1929  Clinical Social Work is seeking post-discharge placement for this patient at the Skilled  Nursing Facility level of care (*CSW will initial, date and re-position this form in  chart as items are completed):  Yes   Patient/family provided with [place] Clinical Social Work Department's list of facilities offering this level of care within the geographic area requested by the patient (or if unable, by the patient's family).  Yes   Patient/family informed of their freedom to choose among providers that offer the needed level of care, that participate in Medicare, Medicaid or managed care program needed by the patient, have an available bed and are willing to accept the patient.  Yes   Patient/family informed of [place]'s ownership interest in [facility] and [facility], as well as of the fact that they are under no obligation to receive care at these facilities.  PASRR submitted to EDS on       PASRR number received on       Existing PASRR number confirmed on 12/25/15     FL2 transmitted to all facilities in geographic area requested by pt/family on 12/25/15     FL2 transmitted to all facilities within larger geographic area on       Patient informed that his/her managed care company has contracts with or will negotiate with certain facilities, including the following:        Yes   Patient/family informed of bed offers received.  Patient [facility] bed [facility]     Physician recommends and patient chooses bed at      Patient to be transferred [facility] on   .  Patient to be transferred to facility by       Patient family notified on   of transfer.  Name of family member notified:        PHYSICIAN       Additional Comment:    _______________________________________________ Angela Keith, Angela Butner F, LCSW 12/26/2015, 9:05 AM

## 2015-12-26 NOTE — Progress Notes (Signed)
  PROGRESS NOTE  Angela Keith [id number] DOB: [birth year] DOA: 12/22/2015 PCP: Pearla DubonnetGATES,Angela NEVILL, MD  Brief Narrative: 82 year old woman lives alone, history of stroke with residual left-sided weakness. Fell in shower and was down approximate 16 hours prior to being found at home.  Assessment/Plan: 1. Rhabdomyolysis secondary to fall at home, prolonged immobility. Renal function preserved.   2. PMH stroke with residual left-sided weakness. Stable.  3. Atrial fibrillation, RBBB, on Eliquis.    Appears to be at baseline. Plan transfer skilled nursing facility today.  DVT prophylaxis: Eliquis Code Status: full code Family Communication: daughter-in-law at bedside Disposition Plan: SNF  Angela Sacksaniel Kamiya Acord, MD  [facility] Direct contact: [phone] --Via amion app OR  --[email or web address]; password [email or web address]  7PM-7AM contact night coverage as above 12/26/2015, 12:07 PM  LOS: 3 days   Consultants:  None   Procedures:  None   Antimicrobials:    HPI/Subjective: Doing well. No complaints.  Objective: Filed Vitals:   12/25/15 1200 12/25/15 1420 12/25/15 2135 12/26/15 0449  BP: 157/68 143/70 166/76 139/72  Pulse: 49 53 67 54  Temp:  98 F (36.7 C) 98 F (36.7 C) 97.4 F (36.3 C)  TempSrc:  Oral Oral Oral  Resp:  16  16  Height:      Weight:      SpO2:  98% 98% 96%    Intake/Output Summary (Last 24 hours) at 12/26/15 1207 Last data filed at 12/26/15 1009  Gross per 24 hour  Intake 1477.5 ml  Output   2500 ml  Net -1022.5 ml     Filed Weights   12/23/15 0610 12/24/15 0452 12/25/15 0424  Weight: 58.287 kg (128 lb 8 oz) 60.283 kg (132 lb 14.4 oz) 59.194 kg (130 lb 8 oz)    Exam: Constitutional:  . Appears calm and comfortable Respiratory:  . CTA bilaterally, no w/r/r.  . Respiratory effort normal. No retractions or accessory muscle use Cardiovascular:  . RRR, no m/r/g . No LE extremity edema   Psychiatric:  . judgement and insight appear  normal . Mental status o Mood, affect appropriate   I have personally reviewed following labs and imaging studies:  No no data  Scheduled Meds: . apixaban  5 mg Oral BID  . baclofen  10 mg Oral TID  . dorzolamide-timolol  1 drop Both Eyes BID  . gabapentin  300 mg Oral QHS  . latanoprost  1 drop Both Eyes QHS  . levothyroxine  88 mcg Oral QAC breakfast  . lidocaine  1 patch Transdermal Q24H  . metoprolol tartrate  25 mg Oral BID  . mirtazapine  7.5 mg Oral QHS  . pantoprazole  40 mg Oral BID  . sodium chloride flush  3 mL Intravenous Q12H  . tamsulosin  0.4 mg Oral Daily  . traZODone  25 mg Oral QHS  . vitamin B-12  1,000 mcg Oral Daily   Continuous Infusions: . sodium chloride 50 mL/hr at 12/25/15 1656    Principal Problem:   Rhabdomyolysis   LOS: 3 days

## 2015-12-26 NOTE — Care Management Important Message (Signed)
Important Message  Patient Details  Name: Angela Keith Date of Birth: January 08, 1930   Medicare Important Message Given:  Yes    Haskell FlirtJamison, Dede Dobesh 12/26/2015, 9:10 AMImportant Message  Patient Details  Name: Angela Keith Date of Birth: January 08, 1930   Medicare Important Message Given:  Yes    Haskell FlirtJamison, Lyly Canizales 12/26/2015, 9:10 AM

## 2015-12-26 NOTE — Clinical Social Work Placement (Signed)
Patient is set to [facility] today. Patient & daughter-in-law, Angela Keith at bedside made aware. Discharge packet given to RN, [place]. Daughter-in-law to transport to SNF.     Lincoln MaxinKelly Talal Fritchman, LCSW [facility] Clinical Social Worker cell #: [phone]    CLINICAL SOCIAL WORK PLACEMENT  NOTE  Date:  12/26/2015  Patient Details  Name: Angela Keith MRN: [id number] Date of Birth: Aug 13, 1929  Clinical Social Work is seeking post-discharge placement for this patient at the Skilled  Nursing Facility level of care (*CSW will initial, date and re-position this form in  chart as items are completed):  Yes   Patient/family provided with [place] Clinical Social Work Department's list of facilities offering this level of care within the geographic area requested by the patient (or if unable, by the patient's family).  Yes   Patient/family informed of their freedom to choose among providers that offer the needed level of care, that participate in Medicare, Medicaid or managed care program needed by the patient, have an available bed and are willing to accept the patient.  Yes   Patient/family informed of [place]'s ownership interest in [facility] and [facility], as well as of the fact that they are under no obligation to receive care at these facilities.  PASRR submitted to EDS on       PASRR number received on       Existing PASRR number confirmed on 12/25/15     FL2 transmitted to all facilities in geographic area requested by pt/family on 12/25/15     FL2 transmitted to all facilities within larger geographic area on       Patient informed that his/her managed care company has contracts with or will negotiate with certain facilities, including the following:        Yes   Patient/family informed of bed offers received.  [facility]     Physician recommends and patient chooses bed at      Patient to  be transferred to [facility] on 12/26/15.  Patient to be transferred to facility by patient's daughter-in-law, Dena's car     Patient family notified on 12/26/15 of transfer.  Name of family member notified:  patient's daughter-in-law, Angela Keith at bedside     PHYSICIAN       Additional Comment:    _______________________________________________ [name], Adilynn Bessey F, LCSW 12/26/2015, 1:45 PM

## 2015-12-26 NOTE — Discharge Summary (Signed)
Physician Discharge Summary  Angela Keith ZOX:[id number] DOB: [birth year] DOA: 12/22/2015  PCP: [name], MD  Admit date: 12/22/2015 Discharge date: 12/26/2015  Recommendations for Outpatient Follow-up:  SNF for rehab   Follow-up Information    Follow up with [facility].   Specialty:  Skilled Nursing Facility   Contact information:   [address] [place] [address] [phone]      Follow up with [name], MD.   Specialty:  Internal Medicine   Why:  As needed   Contact information:   [address] [place] [address] [phone]      Discharge Diagnoses:  1. Rhabdomyolysis secondary to fall at home prolonged immobility 2. Atrial fibrillation  Discharge Condition: improved Disposition: SNF for rehab  Diet recommendation: heart healthy  Filed Weights   12/23/15 0610 12/24/15 0452 12/25/15 0424  Weight: 58.287 kg (128 lb 8 oz) 60.283 kg (132 lb 14.4 oz) 59.194 kg (130 lb 8 oz)    History of present illness:  82 year old woman lives alone, history of stroke with residual left-sided weakness. Fell in shower and was down approximate 16 hours prior to being found at home.  Hospital Course:  Patient was treated with IV fluids and supportive care. Renal function was preserved. CK trended down. Hospitalization was uncomplicated. Seen by physical therapy recommendation for rehabilitation which patient accepted.  1. Rhabdomyolysis secondary to fall at home, prolonged immobility. Renal function preserved.  2. PMH stroke with residual left-sided weakness. Stable.  3. Atrial fibrillation, RBBB, on Eliquis.  Consultants:  None  Procedures:  None  Discharge Instructions   Current Discharge Medication List    CONTINUE these medications which have NOT CHANGED   Details  apixaban (ELIQUIS) 5 MG TABS tablet Take 1 tablet (5 mg total) by mouth 2 (two) times daily. Qty: 180 tablet,  Refills: 3   Associated Diagnoses: Cryptogenic stroke (HCC)    baclofen (LIORESAL) 10 MG tablet Take 1 tablet (10 mg total) by mouth 3 (three) times daily. And 20 mg at bedtime Qty: 120 each, Refills: 3    benzonatate (TESSALON) 100 MG capsule Take 1 capsule (100 mg total) by mouth 3 (three) times daily as needed for cough. Qty: 20 capsule, Refills: 0    BIOTIN PO Take by mouth daily.    Cyanocobalamin (VITAMIN B-12 SL) Place under the tongue daily.    dorzolamide-timolol (COSOPT) 22.3-6.8 MG/ML ophthalmic solution Place 1 drop into both eyes 2 (two) times daily.    gabapentin (NEURONTIN) 300 MG capsule Take 300 mg by mouth at bedtime.     latanoprost (XALATAN) 0.005 % ophthalmic solution Place 1 drop into both eyes at bedtime.    levothyroxine (SYNTHROID, LEVOTHROID) 88 MCG tablet Take 88 mcg by mouth daily before breakfast.    lidocaine (LIDODERM) 5 % Place 1 patch onto the skin daily. Remove & Discard patch within 12 hours or as directed by MD    loratadine (CLARITIN) 10 MG tablet Take 10 mg by mouth daily as needed for allergies.     metoprolol tartrate (LOPRESSOR) 25 MG tablet Take 25 mg by mouth 2 (two) times daily.   Associated Diagnoses: Back pain with left-sided radiculopathy    mirtazapine (REMERON) 7.5 MG tablet Take 7.5 mg by mouth at bedtime.    pantoprazole (PROTONIX) 40 MG tablet Take 40 mg by mouth 2 (two) times daily.   Associated Diagnoses: Back pain with left-sided radiculopathy    tamsulosin (FLOMAX) 0.4 MG  CAPS capsule Take 0.4 mg by mouth daily.    traZODone (DESYREL) 50 MG tablet Take 0.5 tablets (25 mg total) by mouth at bedtime. Qty: 5 tablet, Refills: 0      STOP taking these medications     ciprofloxacin (CIPRO) 250 MG tablet      Phenazopyridine HCl (AZO TABS PO)      traMADol (ULTRAM) 50 MG tablet        Allergies  Allergen Reactions  . Ace Inhibitors Other (See Comments)    Severe fatigue  . Lipitor [Atorvastatin] Other (See Comments)     Mood swings  . Motrin [Ibuprofen] Nausea And Vomiting    The results of significant diagnostics from this hospitalization (including imaging, microbiology, ancillary and laboratory) are listed below for reference.    Significant Diagnostic Studies: Dg Lumbar Spine Complete  12/22/2015  CLINICAL DATA:  Low back pain after fall last night. EXAM: LUMBAR SPINE - COMPLETE 4+ VIEW COMPARISON:  Radiographs of December 12, 2014. FINDINGS: Moderate dextroscoliosis of lumbar spine is noted. Status post surgical posterior fusion of L4-5 with bilateral intrapedicular screw placement. No fracture or spondylolisthesis is noted. Moderate degenerative disc disease is noted at L1-2 and L2-3. Atherosclerosis of abdominal aorta is noted. IMPRESSION: Postsurgical and degenerative changes as described above. No acute abnormality seen in the lumbar spine. Electronically Signed   By: Lupita Raider, M.D.   On: 12/22/2015 17:56   Dg Pelvis 1-2 Views  12/22/2015  CLINICAL DATA:  Fall in bathtub last night with low back pain as well as sacral and buttock pain. EXAM: PELVIS - 1-2 VIEW COMPARISON:  CT 10/30/2012 FINDINGS: Examination demonstrates mild diffuse decreased bone mineralization. Fusion hardware is present over the lower lumbar spine intact. There are degenerative changes of the spine and mild degenerative changes of the hips. No acute fracture or dislocation. IMPRESSION: No acute findings. Electronically Signed   By: Elberta Fortis M.D.   On: 12/22/2015 18:03   Dg Shoulder Right  12/22/2015  CLINICAL DATA:  Right shoulder pain after fall at home last night. Initial encounter. EXAM: RIGHT SHOULDER - 2+ VIEW COMPARISON:  None. FINDINGS: There is no evidence of fracture or dislocation. There is no evidence of arthropathy or other focal bone abnormality. Soft tissues are unremarkable. IMPRESSION: Normal right shoulder. Electronically Signed   By: Lupita Raider, M.D.   On: 12/22/2015 19:36   Ct Head Wo  Contrast  12/22/2015  CLINICAL DATA:  Fall at home last night. Denies loss of consciousness. EXAM: CT HEAD WITHOUT CONTRAST TECHNIQUE: Contiguous axial images were obtained from the base of the skull through the vertex without intravenous contrast. COMPARISON:  CT and MRI scan of January 18, 2015. FINDINGS: Bony calvarium appears intact. Encephalomalacia of the right ACA territory is noted consistent with old infarction. Stable lacunar infarction in left basal ganglia is noted. No mass effect or midline shift is noted. Ventricular size is within normal limits. There is no evidence of mass lesion, hemorrhage or acute infarction. IMPRESSION: Old right ACA infarction as described above. Old lacunar infarction in left basal ganglia. No acute intracranial abnormality seen. Electronically Signed   By: Lupita Raider, M.D.   On: 12/22/2015 18:03     Labs: Basic Metabolic Panel:  Recent Labs Lab 12/22/15 1654 12/23/15 0516 12/24/15 1025  NA 141 140 140  K 3.4* 3.3* 3.7  CL 105 107 108  CO2 GLUCOSE 94 81 129*  BUN 11 12 11  CREATININE 0.70 0.80 0.78  CALCIUM 9.4 8.8* 8.5*   Liver Function Tests:  Recent Labs Lab 12/22/15 1654 12/23/15 0516  AST 42* 33  ALT 23 19  ALKPHOS 62 51  BILITOT 0.8 1.0  PROT 6.9 6.1*  ALBUMIN 4.1 3.4*   CBC:  Recent Labs Lab 12/22/15 1654 12/23/15 0516  WBC 9.4 6.9  NEUTROABS 6.9  --   HGB 13.2 11.9*  HCT 40.3 36.0  MCV 87.2 86.1  PLT 283 243   Cardiac Enzymes:  Recent Labs Lab 12/22/15 1654 12/23/15 0516 12/24/15 1025  CKTOTAL 1107* 608* 284*      Principal Problem:   Rhabdomyolysis   Time coordinating discharge: 35 minutes  Signed:  Brendia Sacksaniel Chermaine Schnyder, MD [facility] 12/26/2015, 1:17 PM

## 2015-12-26 NOTE — Care Management Note (Signed)
Case Management Note  Patient Details  Name: Angela Keith MRN: [id number] Date of Birth: [birth year]  Subjective/Objective:                    Action/Plan:d/c SNF.   Expected Discharge Date:   (unknown)               Expected Discharge Plan:  Skilled Nursing Facility  In-House Referral:  Clinical Social Work  Discharge planning Services  CM Consult  Post Acute Care Choice:    Choice offered to:     DME Arranged:    DME Agency:     HH Arranged:    HH Agency:     Status of Service:  Completed, signed off  If discussed at [employer] Meetings, dates discussed:    Additional Comments:  Lanier ClamMahabir, Gricel Copen, RN 12/26/2015, 1:59 PM

## 2015-12-26 NOTE — Progress Notes (Signed)
Pt will be transferred to [facility] with daughter via private vehicle. Packet given to daughter for facility. Discharge instructions given and all questions answered.

## 2015-12-27 ENCOUNTER — Non-Acute Institutional Stay (SKILLED_NURSING_FACILITY): Payer: Medicare Other | Admitting: Internal Medicine

## 2015-12-27 ENCOUNTER — Encounter: Payer: Self-pay | Admitting: Internal Medicine

## 2015-12-27 DIAGNOSIS — T796XXA Traumatic ischemia of muscle, initial encounter: Secondary | ICD-10-CM | POA: Diagnosis not present

## 2015-12-27 DIAGNOSIS — E034 Atrophy of thyroid (acquired): Secondary | ICD-10-CM

## 2015-12-27 DIAGNOSIS — I6330 Cerebral infarction due to thrombosis of unspecified cerebral artery: Secondary | ICD-10-CM

## 2015-12-27 DIAGNOSIS — I10 Essential (primary) hypertension: Secondary | ICD-10-CM | POA: Diagnosis not present

## 2015-12-27 DIAGNOSIS — E038 Other specified hypothyroidism: Secondary | ICD-10-CM | POA: Diagnosis not present

## 2015-12-27 DIAGNOSIS — I480 Paroxysmal atrial fibrillation: Secondary | ICD-10-CM | POA: Diagnosis not present

## 2015-12-27 DIAGNOSIS — K219 Gastro-esophageal reflux disease without esophagitis: Secondary | ICD-10-CM | POA: Diagnosis not present

## 2015-12-27 NOTE — Progress Notes (Addendum)
MRN: [id number] Name: Angela Keith  Sex: female Age: 82 y.o. DOB: [birth year]  PSC #:  Facility/Room: [facility] / [address] Level Of Care: SNF Provider: Randon Goldsmith. Lyn Hollingshead, MD Emergency Contacts: Extended Emergency Contact Information Primary Emergency Contact: Barnes,B J Address: [address]          [place], [place] [address] [place] of [place] Home Phone: [phone] Work Phone: [phone] Mobile Phone: [phone] Relation: Son Secondary Emergency Contact: Col[place], [place] [address] [place] of [place] Home Phone: [phone] Mobile Phone: [phone] Relation: Relative  Code Status: Full Code  Allergies: Ace inhibitors; Lipitor [atorvastatin]; and Motrin [ibuprofen]  Chief Complaint  Patient presents with  . New Admit To SNF    Admit to Facility    HPI: Patient is 82 y.o. female who lives alone, history of stroke with residual left-sided weakness. Fell in shower and was down approximate 16 hours prior to being found at home. Pt was admitted to [facility] from 6/16-20 where she received IVF, Renal function was not compromised. Pt is admitted to SNF for generalized weakness for OT/PT. While as SNF pt will be followed for AF, tx with eliquis and metoprolol, GERD, tx with protonic=x and hypothyroidism, tx with synthroid.  Past Medical History:  Diagnosis Date  . Arthritis   . Atrial fibrillation (HCC)   . Chronic neck pain   . DJD (degenerative joint disease), cervical   . DJD (degenerative joint disease), lumbar   . Dysphagia   . GERD (gastroesophageal reflux disease)   . Glaucoma   . HTN (hypertension) 10/30/2012  . Hyperlipidemia   . Hypothyroidism   . Insomnia   . Nocturia   . Right bundle branch block 03/21/2014  . Rotoscoliosis   . Severe hearing loss   . Shortness of breath dyspnea   . Stroke (HCC)   . Unspecified hypothyroidism 10/30/2012    Past Surgical History:  Procedure Laterality Date  . BACK SURGERY     lower  back   . bladder laser surgery    . BREAST SURGERY    . cataract surgery    . EP IMPLANTABLE DEVICE N/A 01/19/2015   Procedure: Loop Recorder Insertion;  Surgeon: Marinus Maw, MD;  Location: [facility]VASIVE CV LAB;  Service: Cardiovascular;  Laterality: N/A;  . ESOPHAGOGASTRODUODENOSCOPY (EGD) WITH ESOPHAGEAL DILATION  06/08/2012   Procedure: ESOPHAGOGASTRODUODENOSCOPY (EGD) WITH ESOPHAGEAL DILATION;  Surgeon: Charolett Bumpers, MD;  Location: [facility] ENDOSCOPY;  Service: Endoscopy;  Laterality: N/A;  . facail surgery  2000   facelift and eyelid lift  . left carpal tunnel surgery    . LUMBAR LAMINECTOMY/DECOMPRESSION MICRODISCECTOMY N/A 01/17/2015   Procedure: Laminectomy and Foraminotomy - L2-L3 - L3-L4;  Surgeon: [name], MD;  Location: [facility] NEURO ORS;  Service: Neurosurgery;  Laterality: N/A;  Laminectomy and Foraminotomy - L2-L3 - L3-L4  . TEE WITHOUT CARDIOVERSION N/A 01/19/2015   Procedure: TRANSESOPHAGEAL ECHOCARDIOGRAM (TEE);  Surgeon: Chrystie Nose, MD;  Location: [facility] ENDOSCOPY;  Service: Cardiovascular;  Laterality: N/A;      Medication List       Accurate as of 12/27/15 11:59 PM. Always use your most recent med list.          apixaban 5 MG Tabs tablet Commonly known as:  ELIQUIS Take 1 tablet (5 mg total) by mouth 2 (two) times daily.   baclofen 10 MG tablet Commonly known as:  LIORESAL Take 1 tablet by mouth 3 times daily  and take 2 tablets by mouth at bedtime.   benzonatate 100 MG capsule Commonly known as:  TESSALON Take 1 capsule (100 mg total) by mouth 3 (three) times daily as needed for cough.   BIOTIN PO Take by mouth daily.   dorzolamide-timolol 22.3-6.8 MG/ML ophthalmic solution Commonly known as:  COSOPT Place 1 drop into both eyes 2 (two) times daily.   gabapentin 300 MG capsule Commonly known as:  NEURONTIN Take 300 mg by mouth at bedtime.   latanoprost 0.005 % ophthalmic solution Commonly known as:  XALATAN Place 1 drop into both eyes at bedtime.    levothyroxine 88 MCG tablet Commonly known as:  SYNTHROID, LEVOTHROID Take 88 mcg by mouth daily before breakfast.   lidocaine 5 % Commonly known as:  LIDODERM Place 1 patch onto the skin daily. Left lateral thigh  Remove & Discard patch within 12 hours or as directed by MD   loratadine 10 MG tablet Commonly known as:  CLARITIN Take 10 mg by mouth daily as needed for allergies.   metoprolol tartrate 25 MG tablet Commonly known as:  LOPRESSOR Take 25 mg by mouth 2 (two) times daily.   mirtazapine 7.5 MG tablet Commonly known as:  REMERON Take 7.5 mg by mouth at bedtime.   pantoprazole 40 MG tablet Commonly known as:  PROTONIX Take 40 mg by mouth 2 (two) times daily.   tamsulosin 0.4 MG Caps capsule Commonly known as:  FLOMAX Take 0.4 mg by mouth daily.   traZODone 50 MG tablet Commonly known as:  DESYREL Take 0.5 tablets (25 mg total) by mouth at bedtime.   VITAMIN B-12 SL Place 1 tablet under the tongue daily.       Meds ordered this encounter  Medications  . baclofen (LIORESAL) 10 MG tablet    Sig: Take 1 tablet by mouth 3 times daily and take 2 tablets by mouth at bedtime.    Immunization History  Administered Date(s) Administered  . PPD Test 12/26/2015    Social History  Substance Use Topics  . Smoking status: Never Smoker  . Smokeless tobacco: Not on file  . Alcohol use No    Family history is   Family History  Problem Relation Age of Onset  . CAD Father   . Stroke Father   . Diabetes Mother   . Lung cancer Other   . CAD Brother       Review of Systems  DATA OBTAINED: from patient, nurse GENERAL:  no fevers, fatigue, appetite changes SKIN: No itching, rash or wounds EYES: No eye pain, redness, discharge EARS: No earache, tinnitus, change in hearing NOSE: No congestion, drainage or bleeding  MOUTH/THROAT: No mouth or tooth pain, No sore throat RESPIRATORY: No cough, wheezing, SOB CARDIAC: No chest pain, palpitations, lower extremity  edema  GI: No abdominal pain, No N/V/D or constipation, No heartburn or reflux  GU: No dysuria, frequency or urgency, or incontinence  MUSCULOSKELETAL: No unrelieved bone/joint pain NEUROLOGIC: No headache, dizziness or focal weakness PSYCHIATRIC: No c/o anxiety or sadness   Vitals:   12/27/15 1111  BP: 132/70  Pulse: 66  Resp: 16  Temp: 98.1 F (36.7 C)    SpO2 Readings from Last 1 Encounters:  12/26/15 96%        Physical Exam  GENERAL APPEARANCE: Alert, conversant,  No acute distress.  SKIN: No diaphoresis rash HEAD: Normocephalic, atraumatic  EYES: Conjunctiva/lids clear. Pupils round, reactive. EOMs intact.  EARS: External exam WNL, canals clear. Hearing grossly normal.  NOSE: No deformity or discharge.  MOUTH/THROAT: Lips w/o lesions  RESPIRATORY: Breathing is even, unlabored. Lung sounds are clear   CARDIOVASCULAR: Heart RRR no murmurs, rubs or gallops. No peripheral edema.   GASTROINTESTINAL: Abdomen is soft, non-tender, not distended w/ normal bowel sounds. GENITOURINARY: Bladder non tender, not distended  MUSCULOSKELETAL: No abnormal joints or musculature NEUROLOGIC:  Cranial nerves 2-12 grossly intact; L side weakness PSYCHIATRIC: Mood and affect appropriate to situation, no behavioral issues  Patient Active Problem List   Diagnosis Date Noted  . GERD (gastroesophageal reflux disease) 12/31/2015  . Pressure ulcer 12/23/2015  . Rhabdomyolysis 12/22/2015  . Atrial fibrillation (HCC) 06/06/2015  . Embolic stroke (HCC) 03/22/2015  . Left hemiparesis (HCC)   . Cryptogenic stroke (HCC)   . H/O TIA (transient ischemic attack) and stroke   . Spinal stenosis, lumbar region, with neurogenic claudication 01/17/2015  . Lumbar stenosis 01/17/2015  . 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 Essential hypertension 11/10/2014  . HLD (hyperlipidemia) 11/10/2014  . Back pain with left-sided radiculopathy 11/08/2014  . Cerebral infarction due to thrombosis of  cerebral artery (HCC)   . Hyperlipidemia LDL goal <70 09/07/2014  . Presumed CVA (cerebral infarction) s/p IV tPA, MRI negative 09/05/2014  . Hypertensive urgency 09/05/2014  . Right bundle branch block 03/21/2014  . Cough 10/31/2012  . Nausea vomiting and diarrhea 10/30/2012  . Elevated lipase 10/30/2012  . HTN (hypertension) 10/30/2012  . Hypothyroidism 10/30/2012       Component Value Date/Time   WBC 5.0 01/08/2016   WBC 6.9 12/23/2015 0516   RBC 4.18 12/23/2015 0516   HGB 12.1 01/08/2016   HCT 38 01/08/2016   PLT 244 01/08/2016   MCV 86.1 12/23/2015 0516   LYMPHSABS 1.5 12/22/2015 1654   MONOABS 0.8 12/22/2015 1654   EOSABS 0.1 12/22/2015 1654   BASOSABS 0.0 12/22/2015 1654        Component Value Date/Time   NA 138 01/08/2016   K 4.1 01/08/2016   CL 108 12/24/2015 1025   CO2 28 12/24/2015 1025   GLUCOSE 129 (H) 12/24/2015 1025   BUN 14 01/08/2016   CREATININE 0.8 01/08/2016   CREATININE 0.78 12/24/2015 1025   CALCIUM 8.5 (L) 12/24/2015 1025   PROT 6.1 (L) 12/23/2015 0516   ALBUMIN 3.4 (L) 12/23/2015 0516   AST 33 12/23/2015 0516   ALT 19 12/23/2015 0516   ALKPHOS 51 12/23/2015 0516   BILITOT 1.0 12/23/2015 0516   GFRNONAA >60 12/24/2015 1025   GFRAA >60 12/24/2015 1025    Lab Results  Component Value Date   HGBA1C 6.1 (H) 01/19/2015    Lab Results  Component Value Date   CHOL 122 01/19/2015   HDL 52 01/19/2015   LDLCALC 66 01/19/2015   TRIG 20 01/19/2015   CHOLHDL 2.3 01/19/2015     Dg Lumbar Spine Complete  12/22/2015  CLINICAL DATA:  Low back pain after fall last night. EXAM: LUMBAR SPINE - COMPLETE 4+ VIEW COMPARISON:  Radiographs of December 12, 2014. FINDINGS: Moderate dextroscoliosis of lumbar spine is noted. Status post surgical posterior fusion of L4-5 with bilateral intrapedicular screw placement. No fracture or spondylolisthesis is noted. Moderate degenerative disc disease is noted at L1-2 and L2-3. Atherosclerosis of abdominal aorta is noted.  IMPRESSION: Postsurgical and degenerative changes as described above. No acute abnormality seen in the lumbar spine. Electronically Signed   By: Lupita Raider, M.D.   On: 12/22/2015 17:56   Dg Pelvis 1-2 Views  12/22/2015  CLINICAL DATA:  Fall in bathtub last night with low back pain as well as sacral and buttock pain. EXAM: PELVIS - 1-2 VIEW COMPARISON:  CT 10/30/2012 FINDINGS: Examination demonstrates mild diffuse decreased bone mineralization. Fusion hardware is present over the lower lumbar spine intact. There are degenerative changes of the spine and mild degenerative changes of the hips. No acute fracture or dislocation. IMPRESSION: No acute findings. Electronically Signed   By: Elberta Fortisaniel  Boyle M.D.   On: 12/22/2015 18:03   Dg Shoulder Right  12/22/2015  CLINICAL DATA:  Right shoulder pain after fall at home last night. Initial encounter. EXAM: RIGHT SHOULDER - 2+ VIEW COMPARISON:  None. FINDINGS: There is no evidence of fracture or dislocation. There is no evidence of arthropathy or other focal bone abnormality. Soft tissues are unremarkable. IMPRESSION: Normal right shoulder. Electronically Signed   By: Lupita RaiderJames  Green Jr, M.D.   On: 12/22/2015 19:36   Ct Head Wo Contrast  12/22/2015  CLINICAL DATA:  Fall at home last night. Denies loss of consciousness. EXAM: CT HEAD WITHOUT CONTRAST TECHNIQUE: Contiguous axial images were obtained from the base of the skull through the vertex without intravenous contrast. COMPARISON:  CT and MRI scan of January 18, 2015. FINDINGS: Bony calvarium appears intact. Encephalomalacia of the right ACA territory is noted consistent with old infarction. Stable lacunar infarction in left basal ganglia is noted. No mass effect or midline shift is noted. Ventricular size is within normal limits. There is no evidence of mass lesion, hemorrhage or acute infarction. IMPRESSION: Old right ACA infarction as described above. Old lacunar infarction in left basal ganglia. No acute  intracranial abnormality seen. Electronically Signed   By: Lupita RaiderJames  Green Jr, M.D.   On: 12/22/2015 18:03    Not all labs, radiology exams or other studies done during hospitalization come through on my EPIC note; however they are reviewed by me.    Assessment and Plan  HTN (hypertension) SNF - controlled on metoprolol 25 mg ID; cont current med  Atrial fibrillation (HCC) SNF - stable; cont metoprolol fpor rate and eliquis as prophylaxis  Cerebral infarction due to thrombosis of cerebral artery SNF - cont eliquis as prophylaxis  Rhabdomyolysis SNF - mild , resolved wit IVF  Hypothyroidism SNF - TSH low normal 0.690; cont synthroid 88 mcg daily  GERD (gastroesophageal reflux disease) SNF - stable;cont protonix 40 mg BID  Time spent > 35 min;> 50% of time with patient was spent reviewing records, labs, tests and studies, counseling and developing plan of care  Thurston Holenne D. Lyn HollingsheadAlexander, MD

## 2015-12-28 DIAGNOSIS — F329 Major depressive disorder, single episode, unspecified: Secondary | ICD-10-CM | POA: Diagnosis not present

## 2015-12-28 DIAGNOSIS — G4700 Insomnia, unspecified: Secondary | ICD-10-CM | POA: Diagnosis not present

## 2015-12-31 ENCOUNTER — Encounter: Payer: Self-pay | Admitting: Internal Medicine

## 2015-12-31 DIAGNOSIS — K219 Gastro-esophageal reflux disease without esophagitis: Secondary | ICD-10-CM | POA: Insufficient documentation

## 2015-12-31 NOTE — Assessment & Plan Note (Signed)
SNF - TSH low normal 0.690; cont synthroid 88 mcg daily

## 2015-12-31 NOTE — Assessment & Plan Note (Signed)
SNF - stable;cont protonix 40 mg BID

## 2015-12-31 NOTE — Assessment & Plan Note (Signed)
SNF - controlled on metoprolol 25 mg ID; cont current med

## 2015-12-31 NOTE — Assessment & Plan Note (Signed)
SNF - stable; cont metoprolol fpor rate and eliquis as prophylaxis

## 2015-12-31 NOTE — Assessment & Plan Note (Signed)
SNF - cont eliquis as prophylaxis

## 2015-12-31 NOTE — Assessment & Plan Note (Signed)
SNF - mild , resolved wit IVF

## 2016-01-08 LAB — CBC AND DIFFERENTIAL
HCT: 38 % (ref 36–46)
Hemoglobin: 12.1 g/dL (ref 12.0–16.0)
Platelets: 244 10*3/uL (ref 150–399)
WBC: 5 10^3/mL

## 2016-01-08 LAB — BASIC METABOLIC PANEL
BUN: 14 mg/dL (ref 4–21)
CREATININE: 0.8 mg/dL (ref 0.5–1.1)
Glucose: 71 mg/dL
POTASSIUM: 4.1 mmol/L (ref 3.4–5.3)
SODIUM: 138 mmol/L (ref 137–147)

## 2016-01-15 ENCOUNTER — Ambulatory Visit (INDEPENDENT_AMBULATORY_CARE_PROVIDER_SITE_OTHER): Payer: Medicare Other | Admitting: *Deleted

## 2016-01-15 DIAGNOSIS — I639 Cerebral infarction, unspecified: Secondary | ICD-10-CM

## 2016-01-15 NOTE — Progress Notes (Signed)
Carelink Summary Report / Loop Recorder 

## 2016-01-25 DIAGNOSIS — F329 Major depressive disorder, single episode, unspecified: Secondary | ICD-10-CM | POA: Diagnosis not present

## 2016-01-25 DIAGNOSIS — G4700 Insomnia, unspecified: Secondary | ICD-10-CM | POA: Diagnosis not present

## 2016-02-02 ENCOUNTER — Non-Acute Institutional Stay (SKILLED_NURSING_FACILITY): Payer: Medicare Other | Admitting: Internal Medicine

## 2016-02-02 ENCOUNTER — Encounter: Payer: Self-pay | Admitting: Internal Medicine

## 2016-02-02 DIAGNOSIS — G8929 Other chronic pain: Secondary | ICD-10-CM

## 2016-02-02 DIAGNOSIS — M791 Myalgia: Secondary | ICD-10-CM

## 2016-02-02 DIAGNOSIS — M7918 Myalgia, other site: Secondary | ICD-10-CM

## 2016-02-02 DIAGNOSIS — F329 Major depressive disorder, single episode, unspecified: Secondary | ICD-10-CM

## 2016-02-02 DIAGNOSIS — M4806 Spinal stenosis, lumbar region: Secondary | ICD-10-CM

## 2016-02-02 DIAGNOSIS — M48062 Spinal stenosis, lumbar region with neurogenic claudication: Secondary | ICD-10-CM

## 2016-02-02 DIAGNOSIS — G4700 Insomnia, unspecified: Secondary | ICD-10-CM | POA: Diagnosis not present

## 2016-02-02 DIAGNOSIS — F32A Depression, unspecified: Secondary | ICD-10-CM

## 2016-02-02 NOTE — Progress Notes (Signed)
MRN: [id number] Name: Angela Keith  Sex: female Age: 82 y.o. DOB: 20-Oct-1929  PSC #:  Facility/Room: [facility] Level Of Care: SNF Provider: Randon Goldsmith. Lyn Hollingshead, MD Emergency Contacts: Extended Emergency Contact Information Primary Emergency Contact: Barnes,B J Address: [address]          [place], [place] [address] Darden Amber of [place] Home Phone: [phone] Work Phone: [phone] Mobile Phone: [phone] Relation: Son Secondary Emergency Contact: [place], [place] [address] Darden Amber of [place] Home Phone: [phone] Mobile Phone: [phone] Relation: Relative  Code Status: DNR  Allergies: Ace inhibitors; Lipitor [atorvastatin]; and Motrin [ibuprofen]  Chief Complaint  Patient presents with  . Medical Management of Chronic Issues    Routine Visit    HPI: Patient is 82 y.o. female who is being seen for routine issues of depression, insomnia and chronic pain.  Past Medical History:  Diagnosis Date  . Arthritis   . Atrial fibrillation (HCC)   . Chronic neck pain   . DJD (degenerative joint disease), cervical   . DJD (degenerative joint disease), lumbar   . Dysphagia   . GERD (gastroesophageal reflux disease)   . Glaucoma   . HTN (hypertension) 10/30/2012  . Hyperlipidemia   . Hypothyroidism   . Insomnia   . Nocturia   . Right bundle branch block 03/21/2014  . Rotoscoliosis   . Severe hearing loss   . Shortness of breath dyspnea   . Stroke (HCC)   . Unspecified hypothyroidism 10/30/2012    Past Surgical History:  Procedure Laterality Date  . BACK SURGERY     lower back   . bladder laser surgery    . BREAST SURGERY    . cataract surgery    . EP IMPLANTABLE DEVICE N/A 01/19/2015   Procedure: Loop Recorder Insertion;  Surgeon: Marinus Maw, MD;  Location: [facility] INVASIVE CV LAB;  Service: Cardiovascular;  Laterality: N/A;  . ESOPHAGOGASTRODUODENOSCOPY (EGD) WITH ESOPHAGEAL DILATION  06/08/2012   Procedure:  ESOPHAGOGASTRODUODENOSCOPY (EGD) WITH ESOPHAGEAL DILATION;  Surgeon: Charolett Bumpers, MD;  Location: [facility] ENDOSCOPY;  Service: Endoscopy;  Laterality: N/A;  . facail surgery  2000   facelift and eyelid lift  . left carpal tunnel surgery    . LUMBAR LAMINECTOMY/DECOMPRESSION MICRODISCECTOMY N/A 01/17/2015   Procedure: Laminectomy and Foraminotomy - L2-L3 - L3-L4;  Surgeon: [name], MD;  Location: [facility] NEURO ORS;  Service: Neurosurgery;  Laterality: N/A;  Laminectomy and Foraminotomy - L2-L3 - L3-L4  . TEE WITHOUT CARDIOVERSION N/A 01/19/2015   Procedure: TRANSESOPHAGEAL ECHOCARDIOGRAM (TEE);  Surgeon: Chrystie Nose, MD;  Location: [facility] ENDOSCOPY;  Service: Cardiovascular;  Laterality: N/A;      Medication List       Accurate as of 02/02/16 11:59 PM. Always use your most recent med list.          apixaban 5 MG Tabs tablet Commonly known as:  ELIQUIS Take 1 tablet (5 mg total) by mouth 2 (two) times daily.   baclofen 10 MG tablet Commonly known as:  LIORESAL Take 1 tablet by mouth 3 times daily and take 2 tablets by mouth at bedtime.   benzonatate 100 MG capsule Commonly known as:  TESSALON Take 1 capsule (100 mg total) by mouth 3 (three) times daily as needed for cough.   Biotin 1000 MCG tablet Take 1,000 mcg by mouth daily.   dorzolamide-timolol 22.3-6.8 MG/ML ophthalmic solution Commonly known as:  COSOPT Place 1 drop into  both eyes 2 (two) times daily.   ENSURE CLEAR Liqd Take 237 mLs by mouth 2 (two) times daily between meals.   gabapentin 300 MG capsule Commonly known as:  NEURONTIN Take 300 mg by mouth at bedtime.   HYDROcodone-acetaminophen 5-325 MG tablet Commonly known as:  NORCO/VICODIN Take 1 tablet by mouth 2 (two) times daily as needed for moderate pain or severe pain. Take one tablet by mouth at night and take 1 tablet twice daily as needed for pain   latanoprost 0.005 % ophthalmic solution Commonly known as:  XALATAN Place 1 drop into both eyes at  bedtime.   levothyroxine 88 MCG tablet Commonly known as:  SYNTHROID, LEVOTHROID Take 88 mcg by mouth daily before breakfast.   lidocaine 5 % Commonly known as:  LIDODERM Place 1 patch onto the skin daily. Left lateral thigh  Remove & Discard patch within 12 hours or as directed by MD   loratadine 10 MG tablet Commonly known as:  CLARITIN Take 10 mg by mouth daily as needed for allergies.   metoprolol tartrate 25 MG tablet Commonly known as:  LOPRESSOR Take 25 mg by mouth 2 (two) times daily.   mirtazapine 15 MG tablet Commonly known as:  REMERON Take 15 mg by mouth at bedtime.   pantoprazole 40 MG tablet Commonly known as:  PROTONIX Take 40 mg by mouth 2 (two) times daily.   tamsulosin 0.4 MG Caps capsule Commonly known as:  FLOMAX Take 0.4 mg by mouth daily.   traZODone 50 MG tablet Commonly known as:  DESYREL Take 50 mg by mouth at bedtime.   TYLENOL 325 MG tablet Generic drug:  acetaminophen Take 650 mg by mouth every 6 (six) hours as needed for mild pain or moderate pain.   VITAMIN B-12 SL Place 1 tablet under the tongue daily.       No orders of the defined types were placed in this encounter.   Immunization History  Administered Date(s) Administered  . PPD Test 12/26/2015    Social History  Substance Use Topics  . Smoking status: Never Smoker  . Smokeless tobacco: Not on file  . Alcohol use No    Review of Systems  DATA OBTAINED: from patient GENERAL:  no fevers, fatigue, appetite changes SKIN: No itching, rash HEENT: No complaint RESPIRATORY: No cough, wheezing, SOB CARDIAC: No chest pain, palpitations, lower extremity edema  GI: No abdominal pain, No N/V/D or constipation, No heartburn or reflux  GU: No dysuria, frequency or urgency, or incontinence  MUSCULOSKELETAL: No unrelieved bone/joint pain NEUROLOGIC: No headache, dizziness  PSYCHIATRIC: No overt anxiety or sadness  Vitals:   02/02/16 1100  BP: 115/62  Pulse: 69  Resp: 17   Temp: 97.5 F (36.4 C)    Physical Exam  GENERAL APPEARANCE: Alert, conversant, No acute distress  SKIN: No diaphoresis rash HEENT: Unremarkable RESPIRATORY: Breathing is even, unlabored. Lung sounds are clear   CARDIOVASCULAR: Heart RRR no murmurs, rubs or gallops. No peripheral edema  GASTROINTESTINAL: Abdomen is soft, non-tender, not distended w/ normal bowel sounds.  GENITOURINARY: Bladder non tender, not distended  MUSCULOSKELETAL: No abnormal joints or musculature NEUROLOGIC: Cranial nerves 2-12 grossly intact. Moves all extremities PSYCHIATRIC: Mood and affect appropriate to situation, pt is quiet, has her books, no behavioral issues  Patient Active Problem List   Diagnosis Date Noted  . Depression 02/03/2016  . Insomnia 02/03/2016  . Chronic musculoskeletal pain 02/03/2016  . GERD (gastroesophageal reflux disease) 12/31/2015  . Pressure ulcer 12/23/2015  .  Rhabdomyolysis 12/22/2015  . Atrial fibrillation (HCC) 06/06/2015  . Embolic stroke (HCC) 03/22/2015  . Left hemiparesis (HCC)   . Cryptogenic stroke (HCC)   . H/O TIA (transient ischemic attack) and stroke   . Spinal stenosis, lumbar region, with neurogenic claudication 01/17/2015  . Lumbar stenosis 01/17/2015  . 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 Essential hypertension 11/10/2014  . HLD (hyperlipidemia) 11/10/2014  . Back pain with left-sided radiculopathy 11/08/2014  . Cerebral infarction due to thrombosis of cerebral artery (HCC)   . Hyperlipidemia LDL goal <70 09/07/2014  . Presumed CVA (cerebral infarction) s/p IV tPA, MRI negative 09/05/2014  . Hypertensive urgency 09/05/2014  . Right bundle branch block 03/21/2014  . Cough 10/31/2012  . Nausea vomiting and diarrhea 10/30/2012  . Elevated lipase 10/30/2012  . HTN (hypertension) 10/30/2012  . Hypothyroidism 10/30/2012    CBC    Component Value Date/Time   WBC 5.0 01/08/2016   WBC 6.9 12/23/2015 0516   RBC 4.18 12/23/2015 0516    HGB 12.1 01/08/2016   HCT 38 01/08/2016   PLT 244 01/08/2016   MCV 86.1 12/23/2015 0516   LYMPHSABS 1.5 12/22/2015 1654   MONOABS 0.8 12/22/2015 1654   EOSABS 0.1 12/22/2015 1654   BASOSABS 0.0 12/22/2015 1654    CMP     Component Value Date/Time   NA 138 01/08/2016   K 4.1 01/08/2016   CL 108 12/24/2015 1025   CO2 28 12/24/2015 1025   GLUCOSE 129 (H) 12/24/2015 1025   BUN 14 01/08/2016   CREATININE 0.8 01/08/2016   CREATININE 0.78 12/24/2015 1025   CALCIUM 8.5 (L) 12/24/2015 1025   PROT 6.1 (L) 12/23/2015 0516   ALBUMIN 3.4 (L) 12/23/2015 0516   AST 33 12/23/2015 0516   ALT 19 12/23/2015 0516   ALKPHOS 51 12/23/2015 0516   BILITOT 1.0 12/23/2015 0516   GFRNONAA >60 12/24/2015 1025   GFRAA >60 12/24/2015 1025    Assessment and Plan  Depression Chronic but recently has worsened; plan - remeron increased from 7.5 to 15 mg qHS  Insomnia SNF - chronic but recenttly has worsened;plan - trazodone is increased from 25 mg to 0 mg qHS  PAIN ISSUES - pt has chronic pain in low back for  which she uses a lidocaine patch and neurontin , had contracture pain L side from stroke for which she uses baclofen;pt has norco 5 mg written for BID prn;goal will be to not escalate pain regimen   Thurston Hole D. Lyn Hollingshead, MD

## 2016-02-03 ENCOUNTER — Encounter: Payer: Self-pay | Admitting: Internal Medicine

## 2016-02-03 DIAGNOSIS — F329 Major depressive disorder, single episode, unspecified: Secondary | ICD-10-CM | POA: Insufficient documentation

## 2016-02-03 DIAGNOSIS — G8929 Other chronic pain: Secondary | ICD-10-CM | POA: Insufficient documentation

## 2016-02-03 DIAGNOSIS — F32A Depression, unspecified: Secondary | ICD-10-CM | POA: Insufficient documentation

## 2016-02-03 DIAGNOSIS — G4700 Insomnia, unspecified: Secondary | ICD-10-CM | POA: Insufficient documentation

## 2016-02-03 DIAGNOSIS — M7918 Myalgia, other site: Secondary | ICD-10-CM

## 2016-02-03 NOTE — Assessment & Plan Note (Signed)
Chronic but recently has worsened; plan - remeron increased from 7.5 to 15 mg qHS

## 2016-02-03 NOTE — Assessment & Plan Note (Signed)
SNF - chronic but recenttly has worsened;plan - trazodone is increased from 25 mg to 0 mg qHS

## 2016-02-07 ENCOUNTER — Non-Acute Institutional Stay (SKILLED_NURSING_FACILITY): Payer: Medicare Other | Admitting: Internal Medicine

## 2016-02-07 ENCOUNTER — Encounter: Payer: Self-pay | Admitting: Internal Medicine

## 2016-02-07 DIAGNOSIS — F32A Depression, unspecified: Secondary | ICD-10-CM

## 2016-02-07 DIAGNOSIS — E038 Other specified hypothyroidism: Secondary | ICD-10-CM

## 2016-02-07 DIAGNOSIS — I6330 Cerebral infarction due to thrombosis of unspecified cerebral artery: Secondary | ICD-10-CM | POA: Diagnosis not present

## 2016-02-07 DIAGNOSIS — F329 Major depressive disorder, single episode, unspecified: Secondary | ICD-10-CM | POA: Diagnosis not present

## 2016-02-07 DIAGNOSIS — E034 Atrophy of thyroid (acquired): Secondary | ICD-10-CM | POA: Diagnosis not present

## 2016-02-07 DIAGNOSIS — I10 Essential (primary) hypertension: Secondary | ICD-10-CM | POA: Diagnosis not present

## 2016-02-07 DIAGNOSIS — M791 Myalgia: Secondary | ICD-10-CM | POA: Diagnosis not present

## 2016-02-07 DIAGNOSIS — G4700 Insomnia, unspecified: Secondary | ICD-10-CM

## 2016-02-07 DIAGNOSIS — T796XXD Traumatic ischemia of muscle, subsequent encounter: Secondary | ICD-10-CM | POA: Diagnosis not present

## 2016-02-07 DIAGNOSIS — M7918 Myalgia, other site: Secondary | ICD-10-CM

## 2016-02-07 DIAGNOSIS — K219 Gastro-esophageal reflux disease without esophagitis: Secondary | ICD-10-CM | POA: Diagnosis not present

## 2016-02-07 DIAGNOSIS — I480 Paroxysmal atrial fibrillation: Secondary | ICD-10-CM

## 2016-02-07 DIAGNOSIS — G8929 Other chronic pain: Secondary | ICD-10-CM

## 2016-02-07 NOTE — Progress Notes (Signed)
MRN: [id number] Name: Angela Keith  Sex: female Age: 82 y.o. DOB: Nov 14, 1929  PSC #:  Facility/Room: [facility] Level Of Care: SNF Provider: Randon Goldsmith. Lyn Hollingshead, MD Emergency Contacts: Extended Emergency Contact Information Primary Emergency Contact: Barnes,B J Address: [address]          [place], [place] [address] [place] of [place] Home Phone: [phone] Work Phone: [phone] Mobile Phone: [phone] Relation: Son Secondary Emergency Contact: Collier [place], [place] [address] D[place] of [place] Home Phone: [phone] Mobile Phone: [phone] Relation: Relative  Code Status: Full Code  Allergies: Ace inhibitors; Lipitor [atorvastatin]; and Motrin [ibuprofen]  Chief Complaint  Patient presents with  . Discharge Note    Discharged from SNF    HPI: Patient is 82 y.o. female who fell at home and was down for 16 hours before discovered. She was admitted to [facility] from 6/16-20 and then admitted to SNF for generalized weakness. Pt is now ready to be d/c to home.  Past Medical History:  Diagnosis Date  . Arthritis   . Atrial fibrillation (HCC)   . Chronic neck pain   . DJD (degenerative joint disease), cervical   . DJD (degenerative joint disease), lumbar   . Dysphagia   . GERD (gastroesophageal reflux disease)   . Glaucoma   . HTN (hypertension) 10/30/2012  . Hyperlipidemia   . Hypothyroidism   . Insomnia   . Nocturia   . Right bundle branch block 03/21/2014  . Rotoscoliosis   . Severe hearing loss   . Shortness of breath dyspnea   . Stroke (HCC)   . Unspecified hypothyroidism 10/30/2012    Past Surgical History:  Procedure Laterality Date  . BACK SURGERY     lower back   . bladder laser surgery    . BREAST SURGERY    . cataract surgery    . EP IMPLANTABLE DEVICE N/A 01/19/2015   Procedure: Loop Recorder Insertion;  Surgeon: Marinus Maw, MD;  Location[facility] INVASIVE CV LAB;  Service: Cardiovascular;  Laterality:  N/A;  . ESOPHAGOGASTRODUODENOSCOPY (EGD) WITH ESOPHAGEAL DILATION  06/08/2012   Procedure: ESOPHAGOGASTRODUODENOSCOPY (EGD) WITH ESOPHAGEAL DILATION;  Surgeon: Charolett Bumpers, MD;  Location: [facility] ENDOSCOPY;  Service: Endoscopy;  Laterality: N/A;  . facail surgery  2000   facelift and eyelid lift  . left carpal tunnel surgery    . LUMBAR LAMINECTOMY/DECOMPRESSION MICRODISCECTOMY N/A 01/17/2015   Procedure: Laminectomy and Foraminotomy - L2-L3 - L3-L4;  Surgeon: [name], MD;  Location: [facility] NEURO ORS;  Service: Neurosurgery;  Laterality: N/A;  Laminectomy and Foraminotomy - L2-L3 - L3-L4  . TEE WITHOUT CARDIOVERSION N/A 01/19/2015   Procedure: TRANSESOPHAGEAL ECHOCARDIOGRAM (TEE);  Surgeon: Chrystie Nose, MD;  Location: [facility] ENDOSCOPY;  Service: Cardiovascular;  Laterality: N/A;      Medication List       Accurate as of 02/07/16  9:33 AM. Always use your most recent med list.          apixaban 5 MG Tabs tablet Commonly known as:  ELIQUIS Take 1 tablet (5 mg total) by mouth 2 (two) times daily.   baclofen 10 MG tablet Commonly known as:  LIORESAL Take 1 tablet by mouth 3 times daily and take 2 tablets by mouth at bedtime.   benzonatate 100 MG capsule Commonly known as:  TESSALON Take 1 capsule (100 mg total) by mouth 3 (three) times daily as needed for cough.   Biotin 1000  MCG tablet Take 1,000 mcg by mouth daily.   dorzolamide-timolol 22.3-6.8 MG/ML ophthalmic solution Commonly known as:  COSOPT Place 1 drop into both eyes 2 (two) times daily.   ENSURE CLEAR Liqd Take 237 mLs by mouth 2 (two) times daily between meals.   gabapentin 300 MG capsule Commonly known as:  NEURONTIN Take 300 mg by mouth at bedtime.   HYDROcodone-acetaminophen 5-325 MG tablet Commonly known as:  NORCO/VICODIN Take 1 tablet by mouth 2 (two) times daily as needed for moderate pain or severe pain. Take one tablet by mouth at night and take 1 tablet twice daily as needed for pain   latanoprost 0.005  % ophthalmic solution Commonly known as:  XALATAN Place 1 drop into both eyes at bedtime.   levothyroxine 88 MCG tablet Commonly known as:  SYNTHROID, LEVOTHROID Take 88 mcg by mouth daily before breakfast.   lidocaine 5 % Commonly known as:  LIDODERM Place 1 patch onto the skin daily. Left lateral thigh  Remove & Discard patch within 12 hours or as directed by MD   loratadine 10 MG tablet Commonly known as:  CLARITIN Take 10 mg by mouth daily as needed for allergies.   metoprolol tartrate 25 MG tablet Commonly known as:  LOPRESSOR Take 25 mg by mouth 2 (two) times daily.   mirtazapine 15 MG tablet Commonly known as:  REMERON Take 15 mg by mouth at bedtime.   pantoprazole 40 MG tablet Commonly known as:  PROTONIX Take 40 mg by mouth 2 (two) times daily.   tamsulosin 0.4 MG Caps capsule Commonly known as:  FLOMAX Take 0.4 mg by mouth daily.   traZODone 50 MG tablet Commonly known as:  DESYREL Take 50 mg by mouth at bedtime.   TYLENOL 325 MG tablet Generic drug:  acetaminophen Take 650 mg by mouth every 6 (six) hours as needed for mild pain or moderate pain.   VITAMIN B-12 SL Place 1 tablet under the tongue daily.       No orders of the defined types were placed in this encounter.   Immunization History  Administered Date(s) Administered  . PPD Test 12/26/2015    Social History  Substance Use Topics  . Smoking status: Never Smoker  . Smokeless tobacco: Not on file  . Alcohol use No    Vitals:   02/07/16 0920  BP: 128/68  Pulse: 74  Resp: 20  Temp: 97.4 F (36.3 C)    Physical Exam  GENERAL APPEARANCE: Alert, conversant. No acute distress.  HEENT: Unremarkable. RESPIRATORY: Breathing is even, unlabored. Lung sounds are clear   CARDIOVASCULAR: Heart RRR no murmurs, rubs or gallops. No peripheral edema.  GASTROINTESTINAL: Abdomen is soft, non-tender, not distended w/ normal bowel sounds.  NEUROLOGIC: Cranial nerves 2-12 grossly intact. Moves all  extremities  Patient Active Problem List   Diagnosis Date Noted  . Depression 02/03/2016  . Insomnia 02/03/2016  . Chronic musculoskeletal pain 02/03/2016  . GERD (gastroesophageal reflux disease) 12/31/2015  . Pressure ulcer 12/23/2015  . Rhabdomyolysis 12/22/2015  . Atrial fibrillation (HCC) 06/06/2015  . Embolic stroke (HCC) 03/22/2015  . Left hemiparesis (HCC)   . Cryptogenic stroke (HCC)   . H/O TIA (transient ischemic attack) and stroke   . Spinal stenosis, lumbar region, with neurogenic claudication 01/17/2015  . Lumbar stenosis 01/17/2015  . 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 Essential hypertension 11/10/2014  . HLD (hyperlipidemia) 11/10/2014  . Chronic back pain 11/08/2014  . Cerebral infarction due to thrombosis of cerebral  artery (HCC)   . Hyperlipidemia LDL goal <70 09/07/2014  . Presumed CVA (cerebral infarction) s/p IV tPA, MRI negative 09/05/2014  . Hypertensive urgency 09/05/2014  . Right bundle branch block 03/21/2014  . Cough 10/31/2012  . Nausea vomiting and diarrhea 10/30/2012  . Elevated lipase 10/30/2012  . HTN (hypertension) 10/30/2012  . Hypothyroidism 10/30/2012    CBC    Component Value Date/Time   WBC 5.0 01/08/2016   WBC 6.9 12/23/2015 0516   RBC 4.18 12/23/2015 0516   HGB 12.1 01/08/2016   HCT 38 01/08/2016   PLT 244 01/08/2016   MCV 86.1 12/23/2015 0516   LYMPHSABS 1.5 12/22/2015 1654   MONOABS 0.8 12/22/2015 1654   EOSABS 0.1 12/22/2015 1654   BASOSABS 0.0 12/22/2015 1654    CMP     Component Value Date/Time   NA 138 01/08/2016   K 4.1 01/08/2016   CL 108 12/24/2015 1025   CO2 28 12/24/2015 1025   GLUCOSE 129 (H) 12/24/2015 1025   BUN 14 01/08/2016   CREATININE 0.8 01/08/2016   CREATININE 0.78 12/24/2015 1025   CALCIUM 8.5 (L) 12/24/2015 1025   PROT 6.1 (L) 12/23/2015 0516   ALBUMIN 3.4 (L) 12/23/2015 0516   AST 33 12/23/2015 0516   ALT 19 12/23/2015 0516   ALKPHOS 51 12/23/2015 0516   BILITOT 1.0  12/23/2015 0516   GFRNONAA >60 12/24/2015 1025   GFRAA >60 12/24/2015 1025    Assessment and Plan  Pt is d/c to home with HH/OT/PT/Nursing. Medicatins have been reconciled and Rx's written.  No problem-specific Assessment & Plan notes found for this encounter.  Time spent > 30 min;> 50% of time with patient was spent reviewing records, labs, tests and studies, counseling and developing plan of care  Randon Goldsmith. Lyn Hollingshead, MD

## 2016-02-10 DIAGNOSIS — G890 Central pain syndrome: Secondary | ICD-10-CM | POA: Diagnosis not present

## 2016-02-10 DIAGNOSIS — I69354 Hemiplegia and hemiparesis following cerebral infarction affecting left non-dominant side: Secondary | ICD-10-CM | POA: Diagnosis not present

## 2016-02-10 DIAGNOSIS — N390 Urinary tract infection, site not specified: Secondary | ICD-10-CM | POA: Diagnosis not present

## 2016-02-10 DIAGNOSIS — I10 Essential (primary) hypertension: Secondary | ICD-10-CM | POA: Diagnosis not present

## 2016-02-10 DIAGNOSIS — M4722 Other spondylosis with radiculopathy, cervical region: Secondary | ICD-10-CM | POA: Diagnosis not present

## 2016-02-10 DIAGNOSIS — M4726 Other spondylosis with radiculopathy, lumbar region: Secondary | ICD-10-CM | POA: Diagnosis not present

## 2016-02-13 ENCOUNTER — Ambulatory Visit (INDEPENDENT_AMBULATORY_CARE_PROVIDER_SITE_OTHER): Payer: Medicare Other | Admitting: *Deleted

## 2016-02-13 DIAGNOSIS — I10 Essential (primary) hypertension: Secondary | ICD-10-CM | POA: Diagnosis not present

## 2016-02-13 DIAGNOSIS — M4722 Other spondylosis with radiculopathy, cervical region: Secondary | ICD-10-CM | POA: Diagnosis not present

## 2016-02-13 DIAGNOSIS — I69354 Hemiplegia and hemiparesis following cerebral infarction affecting left non-dominant side: Secondary | ICD-10-CM | POA: Diagnosis not present

## 2016-02-13 DIAGNOSIS — N390 Urinary tract infection, site not specified: Secondary | ICD-10-CM | POA: Diagnosis not present

## 2016-02-13 DIAGNOSIS — M4726 Other spondylosis with radiculopathy, lumbar region: Secondary | ICD-10-CM | POA: Diagnosis not present

## 2016-02-13 DIAGNOSIS — I639 Cerebral infarction, unspecified: Secondary | ICD-10-CM

## 2016-02-13 DIAGNOSIS — G890 Central pain syndrome: Secondary | ICD-10-CM | POA: Diagnosis not present

## 2016-02-14 DIAGNOSIS — M4722 Other spondylosis with radiculopathy, cervical region: Secondary | ICD-10-CM | POA: Diagnosis not present

## 2016-02-14 DIAGNOSIS — I69354 Hemiplegia and hemiparesis following cerebral infarction affecting left non-dominant side: Secondary | ICD-10-CM | POA: Diagnosis not present

## 2016-02-14 DIAGNOSIS — G890 Central pain syndrome: Secondary | ICD-10-CM | POA: Diagnosis not present

## 2016-02-14 DIAGNOSIS — M4726 Other spondylosis with radiculopathy, lumbar region: Secondary | ICD-10-CM | POA: Diagnosis not present

## 2016-02-14 DIAGNOSIS — N390 Urinary tract infection, site not specified: Secondary | ICD-10-CM | POA: Diagnosis not present

## 2016-02-14 DIAGNOSIS — I10 Essential (primary) hypertension: Secondary | ICD-10-CM | POA: Diagnosis not present

## 2016-02-14 NOTE — Progress Notes (Signed)
Carelink Summary Report / Loop Recorder 

## 2016-02-15 DIAGNOSIS — T796XXD Traumatic ischemia of muscle, subsequent encounter: Secondary | ICD-10-CM | POA: Diagnosis not present

## 2016-02-15 DIAGNOSIS — F329 Major depressive disorder, single episode, unspecified: Secondary | ICD-10-CM | POA: Diagnosis not present

## 2016-02-15 DIAGNOSIS — M4726 Other spondylosis with radiculopathy, lumbar region: Secondary | ICD-10-CM | POA: Diagnosis not present

## 2016-02-15 DIAGNOSIS — I69354 Hemiplegia and hemiparesis following cerebral infarction affecting left non-dominant side: Secondary | ICD-10-CM | POA: Diagnosis not present

## 2016-02-15 DIAGNOSIS — G890 Central pain syndrome: Secondary | ICD-10-CM | POA: Diagnosis not present

## 2016-02-15 DIAGNOSIS — M4722 Other spondylosis with radiculopathy, cervical region: Secondary | ICD-10-CM | POA: Diagnosis not present

## 2016-02-15 LAB — CUP PACEART REMOTE DEVICE CHECK: Date Time Interrogation Session: 20170709223940

## 2016-02-16 DIAGNOSIS — T796XXD Traumatic ischemia of muscle, subsequent encounter: Secondary | ICD-10-CM | POA: Diagnosis not present

## 2016-02-16 DIAGNOSIS — F329 Major depressive disorder, single episode, unspecified: Secondary | ICD-10-CM | POA: Diagnosis not present

## 2016-02-16 DIAGNOSIS — M4722 Other spondylosis with radiculopathy, cervical region: Secondary | ICD-10-CM | POA: Diagnosis not present

## 2016-02-16 DIAGNOSIS — I69354 Hemiplegia and hemiparesis following cerebral infarction affecting left non-dominant side: Secondary | ICD-10-CM | POA: Diagnosis not present

## 2016-02-16 DIAGNOSIS — G890 Central pain syndrome: Secondary | ICD-10-CM | POA: Diagnosis not present

## 2016-02-16 DIAGNOSIS — M4726 Other spondylosis with radiculopathy, lumbar region: Secondary | ICD-10-CM | POA: Diagnosis not present

## 2016-02-19 DIAGNOSIS — M4726 Other spondylosis with radiculopathy, lumbar region: Secondary | ICD-10-CM | POA: Diagnosis not present

## 2016-02-19 DIAGNOSIS — I10 Essential (primary) hypertension: Secondary | ICD-10-CM | POA: Diagnosis not present

## 2016-02-19 DIAGNOSIS — G4700 Insomnia, unspecified: Secondary | ICD-10-CM | POA: Diagnosis not present

## 2016-02-19 DIAGNOSIS — T796XXD Traumatic ischemia of muscle, subsequent encounter: Secondary | ICD-10-CM | POA: Diagnosis not present

## 2016-02-19 DIAGNOSIS — I69354 Hemiplegia and hemiparesis following cerebral infarction affecting left non-dominant side: Secondary | ICD-10-CM | POA: Diagnosis not present

## 2016-02-19 DIAGNOSIS — M4722 Other spondylosis with radiculopathy, cervical region: Secondary | ICD-10-CM | POA: Diagnosis not present

## 2016-02-19 DIAGNOSIS — I4891 Unspecified atrial fibrillation: Secondary | ICD-10-CM | POA: Diagnosis not present

## 2016-02-19 DIAGNOSIS — G890 Central pain syndrome: Secondary | ICD-10-CM | POA: Diagnosis not present

## 2016-02-19 DIAGNOSIS — F329 Major depressive disorder, single episode, unspecified: Secondary | ICD-10-CM | POA: Diagnosis not present

## 2016-02-20 DIAGNOSIS — M4722 Other spondylosis with radiculopathy, cervical region: Secondary | ICD-10-CM | POA: Diagnosis not present

## 2016-02-20 DIAGNOSIS — F329 Major depressive disorder, single episode, unspecified: Secondary | ICD-10-CM | POA: Diagnosis not present

## 2016-02-20 DIAGNOSIS — M4726 Other spondylosis with radiculopathy, lumbar region: Secondary | ICD-10-CM | POA: Diagnosis not present

## 2016-02-20 DIAGNOSIS — T796XXD Traumatic ischemia of muscle, subsequent encounter: Secondary | ICD-10-CM | POA: Diagnosis not present

## 2016-02-20 DIAGNOSIS — G890 Central pain syndrome: Secondary | ICD-10-CM | POA: Diagnosis not present

## 2016-02-20 DIAGNOSIS — I69354 Hemiplegia and hemiparesis following cerebral infarction affecting left non-dominant side: Secondary | ICD-10-CM | POA: Diagnosis not present

## 2016-02-21 DIAGNOSIS — G890 Central pain syndrome: Secondary | ICD-10-CM | POA: Diagnosis not present

## 2016-02-21 DIAGNOSIS — M4726 Other spondylosis with radiculopathy, lumbar region: Secondary | ICD-10-CM | POA: Diagnosis not present

## 2016-02-21 DIAGNOSIS — F329 Major depressive disorder, single episode, unspecified: Secondary | ICD-10-CM | POA: Diagnosis not present

## 2016-02-21 DIAGNOSIS — T796XXD Traumatic ischemia of muscle, subsequent encounter: Secondary | ICD-10-CM | POA: Diagnosis not present

## 2016-02-21 DIAGNOSIS — M4722 Other spondylosis with radiculopathy, cervical region: Secondary | ICD-10-CM | POA: Diagnosis not present

## 2016-02-21 DIAGNOSIS — I69354 Hemiplegia and hemiparesis following cerebral infarction affecting left non-dominant side: Secondary | ICD-10-CM | POA: Diagnosis not present

## 2016-02-22 DIAGNOSIS — I69354 Hemiplegia and hemiparesis following cerebral infarction affecting left non-dominant side: Secondary | ICD-10-CM | POA: Diagnosis not present

## 2016-02-22 DIAGNOSIS — T796XXD Traumatic ischemia of muscle, subsequent encounter: Secondary | ICD-10-CM | POA: Diagnosis not present

## 2016-02-22 DIAGNOSIS — G890 Central pain syndrome: Secondary | ICD-10-CM | POA: Diagnosis not present

## 2016-02-22 DIAGNOSIS — F329 Major depressive disorder, single episode, unspecified: Secondary | ICD-10-CM | POA: Diagnosis not present

## 2016-02-22 DIAGNOSIS — M4726 Other spondylosis with radiculopathy, lumbar region: Secondary | ICD-10-CM | POA: Diagnosis not present

## 2016-02-22 DIAGNOSIS — M4722 Other spondylosis with radiculopathy, cervical region: Secondary | ICD-10-CM | POA: Diagnosis not present

## 2016-02-23 DIAGNOSIS — F329 Major depressive disorder, single episode, unspecified: Secondary | ICD-10-CM | POA: Diagnosis not present

## 2016-02-23 DIAGNOSIS — T796XXD Traumatic ischemia of muscle, subsequent encounter: Secondary | ICD-10-CM | POA: Diagnosis not present

## 2016-02-23 DIAGNOSIS — G890 Central pain syndrome: Secondary | ICD-10-CM | POA: Diagnosis not present

## 2016-02-23 DIAGNOSIS — I69354 Hemiplegia and hemiparesis following cerebral infarction affecting left non-dominant side: Secondary | ICD-10-CM | POA: Diagnosis not present

## 2016-02-23 DIAGNOSIS — M4722 Other spondylosis with radiculopathy, cervical region: Secondary | ICD-10-CM | POA: Diagnosis not present

## 2016-02-23 DIAGNOSIS — M4726 Other spondylosis with radiculopathy, lumbar region: Secondary | ICD-10-CM | POA: Diagnosis not present

## 2016-02-26 DIAGNOSIS — F329 Major depressive disorder, single episode, unspecified: Secondary | ICD-10-CM | POA: Diagnosis not present

## 2016-02-26 DIAGNOSIS — I69354 Hemiplegia and hemiparesis following cerebral infarction affecting left non-dominant side: Secondary | ICD-10-CM | POA: Diagnosis not present

## 2016-02-26 DIAGNOSIS — G890 Central pain syndrome: Secondary | ICD-10-CM | POA: Diagnosis not present

## 2016-02-26 DIAGNOSIS — M4722 Other spondylosis with radiculopathy, cervical region: Secondary | ICD-10-CM | POA: Diagnosis not present

## 2016-02-26 DIAGNOSIS — T796XXD Traumatic ischemia of muscle, subsequent encounter: Secondary | ICD-10-CM | POA: Diagnosis not present

## 2016-02-26 DIAGNOSIS — H401132 Primary open-angle glaucoma, bilateral, moderate stage: Secondary | ICD-10-CM | POA: Diagnosis not present

## 2016-02-26 DIAGNOSIS — M4726 Other spondylosis with radiculopathy, lumbar region: Secondary | ICD-10-CM | POA: Diagnosis not present

## 2016-02-27 DIAGNOSIS — F329 Major depressive disorder, single episode, unspecified: Secondary | ICD-10-CM | POA: Diagnosis not present

## 2016-02-27 DIAGNOSIS — I69354 Hemiplegia and hemiparesis following cerebral infarction affecting left non-dominant side: Secondary | ICD-10-CM | POA: Diagnosis not present

## 2016-02-27 DIAGNOSIS — T796XXD Traumatic ischemia of muscle, subsequent encounter: Secondary | ICD-10-CM | POA: Diagnosis not present

## 2016-02-27 DIAGNOSIS — M4726 Other spondylosis with radiculopathy, lumbar region: Secondary | ICD-10-CM | POA: Diagnosis not present

## 2016-02-27 DIAGNOSIS — M4722 Other spondylosis with radiculopathy, cervical region: Secondary | ICD-10-CM | POA: Diagnosis not present

## 2016-02-27 DIAGNOSIS — G890 Central pain syndrome: Secondary | ICD-10-CM | POA: Diagnosis not present

## 2016-02-28 DIAGNOSIS — F329 Major depressive disorder, single episode, unspecified: Secondary | ICD-10-CM | POA: Diagnosis not present

## 2016-02-28 DIAGNOSIS — I69354 Hemiplegia and hemiparesis following cerebral infarction affecting left non-dominant side: Secondary | ICD-10-CM | POA: Diagnosis not present

## 2016-02-28 DIAGNOSIS — M4726 Other spondylosis with radiculopathy, lumbar region: Secondary | ICD-10-CM | POA: Diagnosis not present

## 2016-02-28 DIAGNOSIS — G890 Central pain syndrome: Secondary | ICD-10-CM | POA: Diagnosis not present

## 2016-02-28 DIAGNOSIS — T796XXD Traumatic ischemia of muscle, subsequent encounter: Secondary | ICD-10-CM | POA: Diagnosis not present

## 2016-02-28 DIAGNOSIS — M4722 Other spondylosis with radiculopathy, cervical region: Secondary | ICD-10-CM | POA: Diagnosis not present

## 2016-02-29 DIAGNOSIS — M4722 Other spondylosis with radiculopathy, cervical region: Secondary | ICD-10-CM | POA: Diagnosis not present

## 2016-02-29 DIAGNOSIS — M4726 Other spondylosis with radiculopathy, lumbar region: Secondary | ICD-10-CM | POA: Diagnosis not present

## 2016-02-29 DIAGNOSIS — T796XXD Traumatic ischemia of muscle, subsequent encounter: Secondary | ICD-10-CM | POA: Diagnosis not present

## 2016-02-29 DIAGNOSIS — G890 Central pain syndrome: Secondary | ICD-10-CM | POA: Diagnosis not present

## 2016-02-29 DIAGNOSIS — F329 Major depressive disorder, single episode, unspecified: Secondary | ICD-10-CM | POA: Diagnosis not present

## 2016-02-29 DIAGNOSIS — I69354 Hemiplegia and hemiparesis following cerebral infarction affecting left non-dominant side: Secondary | ICD-10-CM | POA: Diagnosis not present

## 2016-03-01 DIAGNOSIS — I69354 Hemiplegia and hemiparesis following cerebral infarction affecting left non-dominant side: Secondary | ICD-10-CM | POA: Diagnosis not present

## 2016-03-01 DIAGNOSIS — T796XXD Traumatic ischemia of muscle, subsequent encounter: Secondary | ICD-10-CM | POA: Diagnosis not present

## 2016-03-01 DIAGNOSIS — M4726 Other spondylosis with radiculopathy, lumbar region: Secondary | ICD-10-CM | POA: Diagnosis not present

## 2016-03-01 DIAGNOSIS — F329 Major depressive disorder, single episode, unspecified: Secondary | ICD-10-CM | POA: Diagnosis not present

## 2016-03-01 DIAGNOSIS — M4722 Other spondylosis with radiculopathy, cervical region: Secondary | ICD-10-CM | POA: Diagnosis not present

## 2016-03-01 DIAGNOSIS — G890 Central pain syndrome: Secondary | ICD-10-CM | POA: Diagnosis not present

## 2016-03-03 DIAGNOSIS — F329 Major depressive disorder, single episode, unspecified: Secondary | ICD-10-CM | POA: Diagnosis not present

## 2016-03-03 DIAGNOSIS — M4726 Other spondylosis with radiculopathy, lumbar region: Secondary | ICD-10-CM | POA: Diagnosis not present

## 2016-03-03 DIAGNOSIS — G890 Central pain syndrome: Secondary | ICD-10-CM | POA: Diagnosis not present

## 2016-03-03 DIAGNOSIS — T796XXD Traumatic ischemia of muscle, subsequent encounter: Secondary | ICD-10-CM | POA: Diagnosis not present

## 2016-03-03 DIAGNOSIS — M4722 Other spondylosis with radiculopathy, cervical region: Secondary | ICD-10-CM | POA: Diagnosis not present

## 2016-03-03 DIAGNOSIS — I69354 Hemiplegia and hemiparesis following cerebral infarction affecting left non-dominant side: Secondary | ICD-10-CM | POA: Diagnosis not present

## 2016-03-05 DIAGNOSIS — F329 Major depressive disorder, single episode, unspecified: Secondary | ICD-10-CM | POA: Diagnosis not present

## 2016-03-05 DIAGNOSIS — G890 Central pain syndrome: Secondary | ICD-10-CM | POA: Diagnosis not present

## 2016-03-05 DIAGNOSIS — I69354 Hemiplegia and hemiparesis following cerebral infarction affecting left non-dominant side: Secondary | ICD-10-CM | POA: Diagnosis not present

## 2016-03-05 DIAGNOSIS — M4722 Other spondylosis with radiculopathy, cervical region: Secondary | ICD-10-CM | POA: Diagnosis not present

## 2016-03-05 DIAGNOSIS — M4726 Other spondylosis with radiculopathy, lumbar region: Secondary | ICD-10-CM | POA: Diagnosis not present

## 2016-03-05 DIAGNOSIS — T796XXD Traumatic ischemia of muscle, subsequent encounter: Secondary | ICD-10-CM | POA: Diagnosis not present

## 2016-03-06 LAB — CUP PACEART REMOTE DEVICE CHECK: Date Time Interrogation Session: 20170808224047

## 2016-03-06 NOTE — Progress Notes (Signed)
Carelink summary report received. Battery status OK. Normal device function. No new symptom episodes, tachy episodes, brady, or pause episodes. 5 AF 0% +Eliquis. Monthly summary reports and ROV/PRN

## 2016-03-07 DIAGNOSIS — T796XXD Traumatic ischemia of muscle, subsequent encounter: Secondary | ICD-10-CM | POA: Diagnosis not present

## 2016-03-07 DIAGNOSIS — F329 Major depressive disorder, single episode, unspecified: Secondary | ICD-10-CM | POA: Diagnosis not present

## 2016-03-07 DIAGNOSIS — I69354 Hemiplegia and hemiparesis following cerebral infarction affecting left non-dominant side: Secondary | ICD-10-CM | POA: Diagnosis not present

## 2016-03-07 DIAGNOSIS — G890 Central pain syndrome: Secondary | ICD-10-CM | POA: Diagnosis not present

## 2016-03-07 DIAGNOSIS — M4722 Other spondylosis with radiculopathy, cervical region: Secondary | ICD-10-CM | POA: Diagnosis not present

## 2016-03-07 DIAGNOSIS — M4726 Other spondylosis with radiculopathy, lumbar region: Secondary | ICD-10-CM | POA: Diagnosis not present

## 2016-03-12 DIAGNOSIS — F329 Major depressive disorder, single episode, unspecified: Secondary | ICD-10-CM | POA: Diagnosis not present

## 2016-03-12 DIAGNOSIS — G890 Central pain syndrome: Secondary | ICD-10-CM | POA: Diagnosis not present

## 2016-03-12 DIAGNOSIS — I69354 Hemiplegia and hemiparesis following cerebral infarction affecting left non-dominant side: Secondary | ICD-10-CM | POA: Diagnosis not present

## 2016-03-12 DIAGNOSIS — M4726 Other spondylosis with radiculopathy, lumbar region: Secondary | ICD-10-CM | POA: Diagnosis not present

## 2016-03-12 DIAGNOSIS — M4722 Other spondylosis with radiculopathy, cervical region: Secondary | ICD-10-CM | POA: Diagnosis not present

## 2016-03-12 DIAGNOSIS — T796XXD Traumatic ischemia of muscle, subsequent encounter: Secondary | ICD-10-CM | POA: Diagnosis not present

## 2016-03-14 ENCOUNTER — Ambulatory Visit (INDEPENDENT_AMBULATORY_CARE_PROVIDER_SITE_OTHER): Payer: Medicare Other | Admitting: *Deleted

## 2016-03-14 DIAGNOSIS — I639 Cerebral infarction, unspecified: Secondary | ICD-10-CM

## 2016-03-14 DIAGNOSIS — T796XXD Traumatic ischemia of muscle, subsequent encounter: Secondary | ICD-10-CM | POA: Diagnosis not present

## 2016-03-14 DIAGNOSIS — F329 Major depressive disorder, single episode, unspecified: Secondary | ICD-10-CM | POA: Diagnosis not present

## 2016-03-14 DIAGNOSIS — M4726 Other spondylosis with radiculopathy, lumbar region: Secondary | ICD-10-CM | POA: Diagnosis not present

## 2016-03-14 DIAGNOSIS — G890 Central pain syndrome: Secondary | ICD-10-CM | POA: Diagnosis not present

## 2016-03-14 DIAGNOSIS — M4722 Other spondylosis with radiculopathy, cervical region: Secondary | ICD-10-CM | POA: Diagnosis not present

## 2016-03-14 DIAGNOSIS — I69354 Hemiplegia and hemiparesis following cerebral infarction affecting left non-dominant side: Secondary | ICD-10-CM | POA: Diagnosis not present

## 2016-03-15 NOTE — Progress Notes (Signed)
Carelink Summary Report / Loop Recorder 

## 2016-03-19 DIAGNOSIS — M4722 Other spondylosis with radiculopathy, cervical region: Secondary | ICD-10-CM | POA: Diagnosis not present

## 2016-03-19 DIAGNOSIS — I69354 Hemiplegia and hemiparesis following cerebral infarction affecting left non-dominant side: Secondary | ICD-10-CM | POA: Diagnosis not present

## 2016-03-19 DIAGNOSIS — F329 Major depressive disorder, single episode, unspecified: Secondary | ICD-10-CM | POA: Diagnosis not present

## 2016-03-19 DIAGNOSIS — M4726 Other spondylosis with radiculopathy, lumbar region: Secondary | ICD-10-CM | POA: Diagnosis not present

## 2016-03-19 DIAGNOSIS — T796XXD Traumatic ischemia of muscle, subsequent encounter: Secondary | ICD-10-CM | POA: Diagnosis not present

## 2016-03-19 DIAGNOSIS — G890 Central pain syndrome: Secondary | ICD-10-CM | POA: Diagnosis not present

## 2016-03-22 DIAGNOSIS — M4722 Other spondylosis with radiculopathy, cervical region: Secondary | ICD-10-CM | POA: Diagnosis not present

## 2016-03-22 DIAGNOSIS — F329 Major depressive disorder, single episode, unspecified: Secondary | ICD-10-CM | POA: Diagnosis not present

## 2016-03-22 DIAGNOSIS — G890 Central pain syndrome: Secondary | ICD-10-CM | POA: Diagnosis not present

## 2016-03-22 DIAGNOSIS — T796XXD Traumatic ischemia of muscle, subsequent encounter: Secondary | ICD-10-CM | POA: Diagnosis not present

## 2016-03-22 DIAGNOSIS — M4726 Other spondylosis with radiculopathy, lumbar region: Secondary | ICD-10-CM | POA: Diagnosis not present

## 2016-03-22 DIAGNOSIS — I69354 Hemiplegia and hemiparesis following cerebral infarction affecting left non-dominant side: Secondary | ICD-10-CM | POA: Diagnosis not present

## 2016-03-27 DIAGNOSIS — I69354 Hemiplegia and hemiparesis following cerebral infarction affecting left non-dominant side: Secondary | ICD-10-CM | POA: Diagnosis not present

## 2016-03-27 DIAGNOSIS — T796XXD Traumatic ischemia of muscle, subsequent encounter: Secondary | ICD-10-CM | POA: Diagnosis not present

## 2016-03-27 DIAGNOSIS — M4722 Other spondylosis with radiculopathy, cervical region: Secondary | ICD-10-CM | POA: Diagnosis not present

## 2016-03-27 DIAGNOSIS — G890 Central pain syndrome: Secondary | ICD-10-CM | POA: Diagnosis not present

## 2016-03-27 DIAGNOSIS — M4726 Other spondylosis with radiculopathy, lumbar region: Secondary | ICD-10-CM | POA: Diagnosis not present

## 2016-03-27 DIAGNOSIS — F329 Major depressive disorder, single episode, unspecified: Secondary | ICD-10-CM | POA: Diagnosis not present

## 2016-03-29 DIAGNOSIS — F329 Major depressive disorder, single episode, unspecified: Secondary | ICD-10-CM | POA: Diagnosis not present

## 2016-03-29 DIAGNOSIS — M4722 Other spondylosis with radiculopathy, cervical region: Secondary | ICD-10-CM | POA: Diagnosis not present

## 2016-03-29 DIAGNOSIS — G890 Central pain syndrome: Secondary | ICD-10-CM | POA: Diagnosis not present

## 2016-03-29 DIAGNOSIS — T796XXD Traumatic ischemia of muscle, subsequent encounter: Secondary | ICD-10-CM | POA: Diagnosis not present

## 2016-03-29 DIAGNOSIS — I69354 Hemiplegia and hemiparesis following cerebral infarction affecting left non-dominant side: Secondary | ICD-10-CM | POA: Diagnosis not present

## 2016-03-29 DIAGNOSIS — M4726 Other spondylosis with radiculopathy, lumbar region: Secondary | ICD-10-CM | POA: Diagnosis not present

## 2016-04-02 DIAGNOSIS — F329 Major depressive disorder, single episode, unspecified: Secondary | ICD-10-CM | POA: Diagnosis not present

## 2016-04-02 DIAGNOSIS — G890 Central pain syndrome: Secondary | ICD-10-CM | POA: Diagnosis not present

## 2016-04-02 DIAGNOSIS — I69354 Hemiplegia and hemiparesis following cerebral infarction affecting left non-dominant side: Secondary | ICD-10-CM | POA: Diagnosis not present

## 2016-04-02 DIAGNOSIS — T796XXD Traumatic ischemia of muscle, subsequent encounter: Secondary | ICD-10-CM | POA: Diagnosis not present

## 2016-04-02 DIAGNOSIS — M4726 Other spondylosis with radiculopathy, lumbar region: Secondary | ICD-10-CM | POA: Diagnosis not present

## 2016-04-02 DIAGNOSIS — M4722 Other spondylosis with radiculopathy, cervical region: Secondary | ICD-10-CM | POA: Diagnosis not present

## 2016-04-04 DIAGNOSIS — G890 Central pain syndrome: Secondary | ICD-10-CM | POA: Diagnosis not present

## 2016-04-04 DIAGNOSIS — F329 Major depressive disorder, single episode, unspecified: Secondary | ICD-10-CM | POA: Diagnosis not present

## 2016-04-04 DIAGNOSIS — M4726 Other spondylosis with radiculopathy, lumbar region: Secondary | ICD-10-CM | POA: Diagnosis not present

## 2016-04-04 DIAGNOSIS — I69354 Hemiplegia and hemiparesis following cerebral infarction affecting left non-dominant side: Secondary | ICD-10-CM | POA: Diagnosis not present

## 2016-04-04 DIAGNOSIS — T796XXD Traumatic ischemia of muscle, subsequent encounter: Secondary | ICD-10-CM | POA: Diagnosis not present

## 2016-04-04 DIAGNOSIS — M4722 Other spondylosis with radiculopathy, cervical region: Secondary | ICD-10-CM | POA: Diagnosis not present

## 2016-04-09 DIAGNOSIS — M4726 Other spondylosis with radiculopathy, lumbar region: Secondary | ICD-10-CM | POA: Diagnosis not present

## 2016-04-09 DIAGNOSIS — I69354 Hemiplegia and hemiparesis following cerebral infarction affecting left non-dominant side: Secondary | ICD-10-CM | POA: Diagnosis not present

## 2016-04-09 DIAGNOSIS — G890 Central pain syndrome: Secondary | ICD-10-CM | POA: Diagnosis not present

## 2016-04-09 DIAGNOSIS — F329 Major depressive disorder, single episode, unspecified: Secondary | ICD-10-CM | POA: Diagnosis not present

## 2016-04-09 DIAGNOSIS — M4722 Other spondylosis with radiculopathy, cervical region: Secondary | ICD-10-CM | POA: Diagnosis not present

## 2016-04-09 DIAGNOSIS — T796XXD Traumatic ischemia of muscle, subsequent encounter: Secondary | ICD-10-CM | POA: Diagnosis not present

## 2016-04-11 DIAGNOSIS — M4722 Other spondylosis with radiculopathy, cervical region: Secondary | ICD-10-CM | POA: Diagnosis not present

## 2016-04-11 DIAGNOSIS — M4726 Other spondylosis with radiculopathy, lumbar region: Secondary | ICD-10-CM | POA: Diagnosis not present

## 2016-04-11 DIAGNOSIS — I69354 Hemiplegia and hemiparesis following cerebral infarction affecting left non-dominant side: Secondary | ICD-10-CM | POA: Diagnosis not present

## 2016-04-11 DIAGNOSIS — F329 Major depressive disorder, single episode, unspecified: Secondary | ICD-10-CM | POA: Diagnosis not present

## 2016-04-11 DIAGNOSIS — T796XXD Traumatic ischemia of muscle, subsequent encounter: Secondary | ICD-10-CM | POA: Diagnosis not present

## 2016-04-11 DIAGNOSIS — G890 Central pain syndrome: Secondary | ICD-10-CM | POA: Diagnosis not present

## 2016-04-13 LAB — CUP PACEART REMOTE DEVICE CHECK: MDC IDC SESS DTM: 20170907230932

## 2016-04-13 NOTE — Progress Notes (Signed)
Carelink summary report received. Battery status OK. Normal device function. No new symptom episodes, tachy episodes, brady episodes. 2 pause episodes, undersensing. 2  AF episodes, +Eliquis. Monthly summary reports and ROV/PRN

## 2016-04-15 ENCOUNTER — Ambulatory Visit (INDEPENDENT_AMBULATORY_CARE_PROVIDER_SITE_OTHER): Payer: Medicare Other | Admitting: *Deleted

## 2016-04-15 DIAGNOSIS — M4726 Other spondylosis with radiculopathy, lumbar region: Secondary | ICD-10-CM | POA: Diagnosis not present

## 2016-04-15 DIAGNOSIS — I69354 Hemiplegia and hemiparesis following cerebral infarction affecting left non-dominant side: Secondary | ICD-10-CM | POA: Diagnosis not present

## 2016-04-15 DIAGNOSIS — F329 Major depressive disorder, single episode, unspecified: Secondary | ICD-10-CM | POA: Diagnosis not present

## 2016-04-15 DIAGNOSIS — I480 Paroxysmal atrial fibrillation: Secondary | ICD-10-CM | POA: Diagnosis not present

## 2016-04-15 DIAGNOSIS — M4722 Other spondylosis with radiculopathy, cervical region: Secondary | ICD-10-CM | POA: Diagnosis not present

## 2016-04-15 DIAGNOSIS — I639 Cerebral infarction, unspecified: Secondary | ICD-10-CM | POA: Diagnosis not present

## 2016-04-15 DIAGNOSIS — I10 Essential (primary) hypertension: Secondary | ICD-10-CM | POA: Diagnosis not present

## 2016-04-15 DIAGNOSIS — G890 Central pain syndrome: Secondary | ICD-10-CM | POA: Diagnosis not present

## 2016-04-15 DIAGNOSIS — G4700 Insomnia, unspecified: Secondary | ICD-10-CM | POA: Diagnosis not present

## 2016-04-15 NOTE — Progress Notes (Signed)
Carelink Summary Report / Loop Recorder 

## 2016-04-16 DIAGNOSIS — I480 Paroxysmal atrial fibrillation: Secondary | ICD-10-CM | POA: Diagnosis not present

## 2016-04-16 DIAGNOSIS — I10 Essential (primary) hypertension: Secondary | ICD-10-CM | POA: Diagnosis not present

## 2016-04-16 DIAGNOSIS — M4722 Other spondylosis with radiculopathy, cervical region: Secondary | ICD-10-CM | POA: Diagnosis not present

## 2016-04-16 DIAGNOSIS — M4726 Other spondylosis with radiculopathy, lumbar region: Secondary | ICD-10-CM | POA: Diagnosis not present

## 2016-04-16 DIAGNOSIS — G890 Central pain syndrome: Secondary | ICD-10-CM | POA: Diagnosis not present

## 2016-04-16 DIAGNOSIS — I69354 Hemiplegia and hemiparesis following cerebral infarction affecting left non-dominant side: Secondary | ICD-10-CM | POA: Diagnosis not present

## 2016-04-18 DIAGNOSIS — M4722 Other spondylosis with radiculopathy, cervical region: Secondary | ICD-10-CM | POA: Diagnosis not present

## 2016-04-18 DIAGNOSIS — G890 Central pain syndrome: Secondary | ICD-10-CM | POA: Diagnosis not present

## 2016-04-18 DIAGNOSIS — I69354 Hemiplegia and hemiparesis following cerebral infarction affecting left non-dominant side: Secondary | ICD-10-CM | POA: Diagnosis not present

## 2016-04-18 DIAGNOSIS — I10 Essential (primary) hypertension: Secondary | ICD-10-CM | POA: Diagnosis not present

## 2016-04-18 DIAGNOSIS — M4726 Other spondylosis with radiculopathy, lumbar region: Secondary | ICD-10-CM | POA: Diagnosis not present

## 2016-04-18 DIAGNOSIS — I480 Paroxysmal atrial fibrillation: Secondary | ICD-10-CM | POA: Diagnosis not present

## 2016-05-13 ENCOUNTER — Ambulatory Visit (INDEPENDENT_AMBULATORY_CARE_PROVIDER_SITE_OTHER): Payer: Medicare Other | Admitting: *Deleted

## 2016-05-13 DIAGNOSIS — I639 Cerebral infarction, unspecified: Secondary | ICD-10-CM

## 2016-05-14 NOTE — Progress Notes (Signed)
Carelink Summary Report / Loop Recorder 

## 2016-05-19 LAB — CUP PACEART REMOTE DEVICE CHECK
Date Time Interrogation Session: 20171008001409
Implantable Pulse Generator Implant Date: 20160714

## 2016-05-19 NOTE — Progress Notes (Signed)
Carelink summary report received. Battery status OK. Normal device function. No new symptom episodes, tachy episodes, brady episodes. 1 pause episode, undersening. 2 AF episodes, SR with PAC's.  Monthly summary reports and ROV/PRN

## 2016-06-05 ENCOUNTER — Encounter: Payer: Medicare Other | Admitting: Internal Medicine

## 2016-06-12 ENCOUNTER — Ambulatory Visit (INDEPENDENT_AMBULATORY_CARE_PROVIDER_SITE_OTHER): Payer: Medicare Other | Admitting: *Deleted

## 2016-06-12 DIAGNOSIS — I639 Cerebral infarction, unspecified: Secondary | ICD-10-CM | POA: Diagnosis not present

## 2016-06-13 NOTE — Progress Notes (Signed)
Carelink Summary Report / Loop Recorder 

## 2016-06-28 LAB — CUP PACEART REMOTE DEVICE CHECK
Date Time Interrogation Session: 20171107013805
MDC IDC PG IMPLANT DT: 20160714

## 2016-06-28 NOTE — Progress Notes (Signed)
Carelink summary report received. Battery status OK. Normal device function. No new symptom episodes, tachy episodes, brady, or pause episodes. No new AF episodes. Monthly summary reports and ROV/PRN 

## 2016-07-11 DIAGNOSIS — L84 Corns and callosities: Secondary | ICD-10-CM | POA: Diagnosis not present

## 2016-07-12 ENCOUNTER — Ambulatory Visit (INDEPENDENT_AMBULATORY_CARE_PROVIDER_SITE_OTHER): Payer: Medicare Other | Admitting: *Deleted

## 2016-07-12 DIAGNOSIS — I639 Cerebral infarction, unspecified: Secondary | ICD-10-CM

## 2016-07-15 NOTE — Progress Notes (Signed)
Carelink Summary Report / Loop Recorder 

## 2016-07-25 ENCOUNTER — Encounter: Payer: Medicare Other | Admitting: Internal Medicine

## 2016-07-31 LAB — CUP PACEART REMOTE DEVICE CHECK
Date Time Interrogation Session: 20171207023739
MDC IDC PG IMPLANT DT: 20160714

## 2016-07-31 NOTE — Progress Notes (Signed)
Carelink summary report received. Battery status OK. Normal device function. No new symptom episodes, tachy episodes, brady, or pause episodes. No new AF episodes. Monthly summary reports and ROV/PRN 

## 2016-08-06 ENCOUNTER — Emergency Department (HOSPITAL_COMMUNITY): Payer: Medicare Other

## 2016-08-06 ENCOUNTER — Inpatient Hospital Stay (HOSPITAL_COMMUNITY): Payer: Medicare Other

## 2016-08-06 ENCOUNTER — Inpatient Hospital Stay (HOSPITAL_COMMUNITY)
Admission: EM | Admit: 2016-08-06 | Discharge: 2016-08-09 | DRG: 065 | Disposition: A | Discharge status: Skilled Nursing Facility | Payer: Medicare Other | Attending: Internal Medicine | Admitting: Internal Medicine

## 2016-08-06 ENCOUNTER — Encounter (HOSPITAL_COMMUNITY): Payer: Self-pay | Admitting: Pharmacy Technician

## 2016-08-06 DIAGNOSIS — I480 Paroxysmal atrial fibrillation: Secondary | ICD-10-CM

## 2016-08-06 DIAGNOSIS — Z9181 History of falling: Secondary | ICD-10-CM | POA: Diagnosis not present

## 2016-08-06 DIAGNOSIS — R400 Somnolence: Secondary | ICD-10-CM

## 2016-08-06 DIAGNOSIS — K219 Gastro-esophageal reflux disease without esophagitis: Secondary | ICD-10-CM | POA: Diagnosis not present

## 2016-08-06 DIAGNOSIS — H409 Unspecified glaucoma: Secondary | ICD-10-CM | POA: Diagnosis present

## 2016-08-06 DIAGNOSIS — Z7901 Long term (current) use of anticoagulants: Secondary | ICD-10-CM | POA: Diagnosis not present

## 2016-08-06 DIAGNOSIS — I160 Hypertensive urgency: Secondary | ICD-10-CM | POA: Diagnosis not present

## 2016-08-06 DIAGNOSIS — I69322 Dysarthria following cerebral infarction: Secondary | ICD-10-CM | POA: Diagnosis not present

## 2016-08-06 DIAGNOSIS — G8194 Hemiplegia, unspecified affecting left nondominant side: Secondary | ICD-10-CM

## 2016-08-06 DIAGNOSIS — I63412 Cerebral infarction due to embolism of left middle cerebral artery: Secondary | ICD-10-CM | POA: Diagnosis present

## 2016-08-06 DIAGNOSIS — I69354 Hemiplegia and hemiparesis following cerebral infarction affecting left non-dominant side: Secondary | ICD-10-CM

## 2016-08-06 DIAGNOSIS — R4701 Aphasia: Secondary | ICD-10-CM | POA: Diagnosis not present

## 2016-08-06 DIAGNOSIS — I63233 Cerebral infarction due to unspecified occlusion or stenosis of bilateral carotid arteries: Secondary | ICD-10-CM | POA: Diagnosis not present

## 2016-08-06 DIAGNOSIS — E785 Hyperlipidemia, unspecified: Secondary | ICD-10-CM | POA: Diagnosis present

## 2016-08-06 DIAGNOSIS — I69392 Facial weakness following cerebral infarction: Secondary | ICD-10-CM

## 2016-08-06 DIAGNOSIS — G8114 Spastic hemiplegia affecting left nondominant side: Secondary | ICD-10-CM | POA: Diagnosis not present

## 2016-08-06 DIAGNOSIS — E039 Hypothyroidism, unspecified: Secondary | ICD-10-CM | POA: Diagnosis present

## 2016-08-06 DIAGNOSIS — E034 Atrophy of thyroid (acquired): Secondary | ICD-10-CM | POA: Diagnosis not present

## 2016-08-06 DIAGNOSIS — Z801 Family history of malignant neoplasm of trachea, bronchus and lung: Secondary | ICD-10-CM

## 2016-08-06 DIAGNOSIS — I69351 Hemiplegia and hemiparesis following cerebral infarction affecting right dominant side: Secondary | ICD-10-CM | POA: Diagnosis not present

## 2016-08-06 DIAGNOSIS — G4700 Insomnia, unspecified: Secondary | ICD-10-CM | POA: Diagnosis present

## 2016-08-06 DIAGNOSIS — Z8249 Family history of ischemic heart disease and other diseases of the circulatory system: Secondary | ICD-10-CM | POA: Diagnosis not present

## 2016-08-06 DIAGNOSIS — I6789 Other cerebrovascular disease: Secondary | ICD-10-CM | POA: Diagnosis not present

## 2016-08-06 DIAGNOSIS — N320 Bladder-neck obstruction: Secondary | ICD-10-CM

## 2016-08-06 DIAGNOSIS — R338 Other retention of urine: Secondary | ICD-10-CM | POA: Diagnosis present

## 2016-08-06 DIAGNOSIS — M6281 Muscle weakness (generalized): Secondary | ICD-10-CM | POA: Diagnosis not present

## 2016-08-06 DIAGNOSIS — I63512 Cerebral infarction due to unspecified occlusion or stenosis of left middle cerebral artery: Secondary | ICD-10-CM

## 2016-08-06 DIAGNOSIS — R339 Retention of urine, unspecified: Secondary | ICD-10-CM | POA: Diagnosis present

## 2016-08-06 DIAGNOSIS — I69320 Aphasia following cerebral infarction: Secondary | ICD-10-CM

## 2016-08-06 DIAGNOSIS — Z833 Family history of diabetes mellitus: Secondary | ICD-10-CM | POA: Diagnosis not present

## 2016-08-06 DIAGNOSIS — R278 Other lack of coordination: Secondary | ICD-10-CM | POA: Diagnosis not present

## 2016-08-06 DIAGNOSIS — R4781 Slurred speech: Secondary | ICD-10-CM | POA: Diagnosis not present

## 2016-08-06 DIAGNOSIS — I10 Essential (primary) hypertension: Secondary | ICD-10-CM | POA: Diagnosis not present

## 2016-08-06 DIAGNOSIS — Z823 Family history of stroke: Secondary | ICD-10-CM

## 2016-08-06 DIAGNOSIS — J90 Pleural effusion, not elsewhere classified: Secondary | ICD-10-CM | POA: Diagnosis not present

## 2016-08-06 DIAGNOSIS — I639 Cerebral infarction, unspecified: Secondary | ICD-10-CM

## 2016-08-06 DIAGNOSIS — Z8673 Personal history of transient ischemic attack (TIA), and cerebral infarction without residual deficits: Secondary | ICD-10-CM

## 2016-08-06 DIAGNOSIS — I69391 Dysphagia following cerebral infarction: Secondary | ICD-10-CM | POA: Diagnosis not present

## 2016-08-06 DIAGNOSIS — M48062 Spinal stenosis, lumbar region with neurogenic claudication: Secondary | ICD-10-CM | POA: Diagnosis present

## 2016-08-06 DIAGNOSIS — I4891 Unspecified atrial fibrillation: Secondary | ICD-10-CM | POA: Diagnosis present

## 2016-08-06 DIAGNOSIS — G459 Transient cerebral ischemic attack, unspecified: Secondary | ICD-10-CM | POA: Diagnosis not present

## 2016-08-06 DIAGNOSIS — R4182 Altered mental status, unspecified: Secondary | ICD-10-CM | POA: Diagnosis not present

## 2016-08-06 LAB — URINALYSIS, ROUTINE W REFLEX MICROSCOPIC
Bacteria, UA: NONE SEEN
Bilirubin Urine: NEGATIVE
GLUCOSE, UA: NEGATIVE mg/dL
Ketones, ur: NEGATIVE mg/dL
Leukocytes, UA: NEGATIVE
Nitrite: NEGATIVE
Protein, ur: NEGATIVE mg/dL
SPECIFIC GRAVITY, URINE: 1.015 (ref 1.005–1.030)
Squamous Epithelial / LPF: NONE SEEN
pH: 8 (ref 5.0–8.0)

## 2016-08-06 LAB — COMPREHENSIVE METABOLIC PANEL
ALK PHOS: 65 U/L (ref 38–126)
ALT: 18 U/L (ref 14–54)
AST: 22 U/L (ref 15–41)
Albumin: 3.9 g/dL (ref 3.5–5.0)
Anion gap: 4 — ABNORMAL LOW (ref 5–15)
BUN: 15 mg/dL (ref 6–20)
CALCIUM: 9.7 mg/dL (ref 8.9–10.3)
CHLORIDE: 107 mmol/L (ref 101–111)
CO2: 31 mmol/L (ref 22–32)
CREATININE: 0.93 mg/dL (ref 0.44–1.00)
GFR calc Af Amer: >60 mL/min (ref >60)
GFR, EST NON AFRICAN AMERICAN: 55 mL/min — AB (ref >60)
Glucose, Bld: 105 mg/dL — ABNORMAL HIGH (ref 65–99)
Potassium: 4 mmol/L (ref 3.5–5.1)
Sodium: 142 mmol/L (ref 135–145)
Total Bilirubin: 0.3 mg/dL (ref 0.3–1.2)
Total Protein: 7.5 g/dL (ref 6.5–8.1)

## 2016-08-06 LAB — I-STAT CHEM 8, ED
BUN: 19 mg/dL (ref 6–20)
CALCIUM ION: 1.24 mmol/L (ref 1.15–1.40)
CHLORIDE: 103 mmol/L (ref 101–111)
CREATININE: 1 mg/dL (ref 0.44–1.00)
GLUCOSE: 103 mg/dL — AB (ref 65–99)
HCT: 42 % (ref 36.0–46.0)
Hemoglobin: 14.3 g/dL (ref 12.0–15.0)
POTASSIUM: 3.9 mmol/L (ref 3.5–5.1)
Sodium: 145 mmol/L (ref 135–145)
TCO2: 33 mmol/L (ref 0–100)

## 2016-08-06 LAB — DIFFERENTIAL
BASOS PCT: 1 %
Basophils Absolute: 0 10*3/uL (ref 0.0–0.1)
Eosinophils Absolute: 0.4 10*3/uL (ref 0.0–0.7)
Eosinophils Relative: 6 %
LYMPHS PCT: 34 %
Lymphs Abs: 2.3 10*3/uL (ref 0.7–4.0)
MONOS PCT: 10 %
Monocytes Absolute: 0.7 10*3/uL (ref 0.1–1.0)
NEUTROS ABS: 3.3 10*3/uL (ref 1.7–7.7)
Neutrophils Relative %: 49 %

## 2016-08-06 LAB — RAPID URINE DRUG SCREEN, HOSP PERFORMED
AMPHETAMINES: NOT DETECTED
BARBITURATES: NOT DETECTED
BENZODIAZEPINES: NOT DETECTED
Cocaine: NOT DETECTED
Opiates: NOT DETECTED
Tetrahydrocannabinol: NOT DETECTED

## 2016-08-06 LAB — BASIC METABOLIC PANEL
BUN: 19 mg/dL (ref 4–21)
BUN: 15 mg/dL (ref 4–21)
CREATININE: 0.9 mg/dL (ref 0.5–1.1)
CREATININE: 1 mg/dL (ref 0.5–1.1)
Glucose: 103 mg/dL
Glucose: 105 mg/dL
POTASSIUM: 3.9 mmol/L (ref 3.4–5.3)
POTASSIUM: 4 mmol/L (ref 3.4–5.3)
SODIUM: 142 mmol/L (ref 137–147)
Sodium: 145 mmol/L (ref 137–147)

## 2016-08-06 LAB — CBC AND DIFFERENTIAL
HEMATOCRIT: 40 % (ref 36–46)
HEMOGLOBIN: 12.8 g/dL (ref 12.0–16.0)
Platelets: 246 10*3/uL (ref 150–399)
WBC: 6.6 10^3/mL

## 2016-08-06 LAB — I-STAT TROPONIN, ED: TROPONIN I, POC: 0 ng/mL (ref 0.00–0.08)

## 2016-08-06 LAB — CBC
HEMATOCRIT: 40.3 % (ref 36.0–46.0)
Hemoglobin: 12.8 g/dL (ref 12.0–15.0)
MCH: 28.6 pg (ref 26.0–34.0)
MCHC: 31.8 g/dL (ref 30.0–36.0)
MCV: 90 fL (ref 78.0–100.0)
Platelets: 246 10*3/uL (ref 150–400)
RBC: 4.48 MIL/uL (ref 3.87–5.11)
RDW: 14.3 % (ref 11.5–15.5)
WBC: 6.6 10*3/uL (ref 4.0–10.5)

## 2016-08-06 LAB — HEPATIC FUNCTION PANEL: Bilirubin, Total: 0.3 mg/dL

## 2016-08-06 LAB — ETHANOL: Alcohol, Ethyl (B): <5 mg/dL (ref <5)

## 2016-08-06 LAB — PROTIME-INR
INR: 0.95
Prothrombin Time: 12.7 seconds (ref 11.4–15.2)

## 2016-08-06 LAB — APTT: APTT: 32 s (ref 24–36)

## 2016-08-06 MED ORDER — BACLOFEN 10 MG PO TABS
20.0000 mg | ORAL_TABLET | Freq: Every day | ORAL | Status: DC
  Administered 2016-08-07: 20 mg via ORAL
  Filled 2016-08-06: qty 2

## 2016-08-06 MED ORDER — APIXABAN 5 MG PO TABS
5.0000 mg | ORAL_TABLET | Freq: Two times a day (BID) | ORAL | Status: DC
  Administered 2016-08-07 – 2016-08-09 (×3): 5 mg via ORAL
  Filled 2016-08-06 (×4): qty 1

## 2016-08-06 MED ORDER — SODIUM CHLORIDE 0.9 % IV SOLN
INTRAVENOUS | Status: AC
  Administered 2016-08-06: via INTRAVENOUS

## 2016-08-06 MED ORDER — LEVOTHYROXINE SODIUM 100 MCG IV SOLR
44.0000 ug | Freq: Once | INTRAVENOUS | Status: AC
  Administered 2016-08-07: 44 ug via INTRAVENOUS
  Filled 2016-08-06: qty 5

## 2016-08-06 MED ORDER — PANTOPRAZOLE SODIUM 40 MG IV SOLR
40.0000 mg | Freq: Once | INTRAVENOUS | Status: AC
  Administered 2016-08-07: 40 mg via INTRAVENOUS
  Filled 2016-08-06: qty 40

## 2016-08-06 MED ORDER — DORZOLAMIDE HCL-TIMOLOL MAL 2-0.5 % OP SOLN
1.0000 [drp] | Freq: Two times a day (BID) | OPHTHALMIC | Status: DC
  Administered 2016-08-07 – 2016-08-09 (×6): 1 [drp] via OPHTHALMIC
  Filled 2016-08-06: qty 10

## 2016-08-06 MED ORDER — ACETAMINOPHEN 160 MG/5ML PO SOLN: 650.0000 mg | ORAL | Status: DC | PRN

## 2016-08-06 MED ORDER — GABAPENTIN 300 MG PO CAPS
600.0000 mg | ORAL_CAPSULE | Freq: Every day | ORAL | Status: DC
  Administered 2016-08-07: 600 mg via ORAL
  Filled 2016-08-06: qty 2

## 2016-08-06 MED ORDER — HYDROCODONE-ACETAMINOPHEN 5-325 MG PO TABS: 1.0000 | ORAL_TABLET | Freq: Two times a day (BID) | ORAL | Status: DC | PRN

## 2016-08-06 MED ORDER — ACETAMINOPHEN 650 MG RE SUPP: 650.0000 mg | RECTAL | Status: DC | PRN

## 2016-08-06 MED ORDER — ASPIRIN 325 MG PO TABS
325.0000 mg | ORAL_TABLET | Freq: Every day | ORAL | Status: DC
  Administered 2016-08-07: 325 mg via ORAL
  Filled 2016-08-06: qty 1

## 2016-08-06 MED ORDER — PANTOPRAZOLE SODIUM 40 MG PO TBEC
40.0000 mg | DELAYED_RELEASE_TABLET | Freq: Two times a day (BID) | ORAL | Status: DC
  Administered 2016-08-07 – 2016-08-09 (×3): 40 mg via ORAL
  Filled 2016-08-06 (×4): qty 1

## 2016-08-06 MED ORDER — HYDRALAZINE HCL 20 MG/ML IJ SOLN: 10.0000 mg | INTRAMUSCULAR | Status: DC | PRN

## 2016-08-06 MED ORDER — METOPROLOL TARTRATE 5 MG/5ML IV SOLN
2.5000 mg | Freq: Four times a day (QID) | INTRAVENOUS | Status: DC
  Administered 2016-08-07 (×4): 2.5 mg via INTRAVENOUS
  Filled 2016-08-06 (×4): qty 5

## 2016-08-06 MED ORDER — ASPIRIN 300 MG RE SUPP
300.0000 mg | Freq: Every day | RECTAL | Status: DC
  Administered 2016-08-06: 300 mg via RECTAL
  Filled 2016-08-06: qty 1

## 2016-08-06 MED ORDER — GABAPENTIN 100 MG PO CAPS
100.0000 mg | ORAL_CAPSULE | Freq: Two times a day (BID) | ORAL | Status: DC
  Administered 2016-08-07 – 2016-08-09 (×2): 100 mg via ORAL
  Filled 2016-08-06 (×3): qty 1

## 2016-08-06 MED ORDER — IOPAMIDOL (ISOVUE-370) INJECTION 76%
INTRAVENOUS | Status: AC
  Administered 2016-08-06: 100 mL
  Filled 2016-08-06: qty 100

## 2016-08-06 MED ORDER — ACETAMINOPHEN 325 MG PO TABS: 650.0000 mg | ORAL_TABLET | ORAL | Status: DC | PRN

## 2016-08-06 MED ORDER — LEVOTHYROXINE SODIUM 88 MCG PO TABS: 88.0000 ug | ORAL_TABLET | Freq: Every day | ORAL | Status: DC

## 2016-08-06 MED ORDER — MIRTAZAPINE 15 MG PO TABS
15.0000 mg | ORAL_TABLET | Freq: Every day | ORAL | Status: DC
  Administered 2016-08-07: 15 mg via ORAL
  Filled 2016-08-06: qty 1

## 2016-08-06 MED ORDER — STROKE: EARLY STAGES OF RECOVERY BOOK
Freq: Once | Status: AC
  Administered 2016-08-07
  Filled 2016-08-06: qty 1

## 2016-08-06 MED ORDER — GABAPENTIN 100 MG PO CAPS: 100.0000 mg | ORAL_CAPSULE | Freq: Two times a day (BID) | ORAL | Status: DC

## 2016-08-06 MED ORDER — LATANOPROST 0.005 % OP SOLN
1.0000 [drp] | Freq: Every day | OPHTHALMIC | Status: DC
  Administered 2016-08-07 – 2016-08-08 (×3): 1 [drp] via OPHTHALMIC
  Filled 2016-08-06: qty 2.5

## 2016-08-06 MED ORDER — LEVOTHYROXINE SODIUM 88 MCG PO TABS
88.0000 ug | ORAL_TABLET | Freq: Every day | ORAL | Status: DC
  Filled 2016-08-06: qty 1

## 2016-08-06 MED ORDER — LIDOCAINE 5 % EX PTCH
1.0000 | MEDICATED_PATCH | CUTANEOUS | Status: DC
  Administered 2016-08-07 – 2016-08-08 (×3): 1 via TRANSDERMAL
  Filled 2016-08-06 (×3): qty 1

## 2016-08-06 MED ORDER — TRAZODONE HCL 50 MG PO TABS: 50.0000 mg | ORAL_TABLET | Freq: Every day | ORAL | Status: DC

## 2016-08-06 MED ORDER — BACLOFEN 10 MG PO TABS
10.0000 mg | ORAL_TABLET | Freq: Three times a day (TID) | ORAL | Status: DC
  Administered 2016-08-07 (×3): 10 mg via ORAL
  Filled 2016-08-06 (×4): qty 1

## 2016-08-06 MED ORDER — TAMSULOSIN HCL 0.4 MG PO CAPS
0.4000 mg | ORAL_CAPSULE | Freq: Every day | ORAL | Status: DC
Start: 2016-08-06 — End: 2016-08-09
  Administered 2016-08-07 – 2016-08-09 (×2): 0.4 mg via ORAL
  Filled 2016-08-06 (×3): qty 1

## 2016-08-06 MED ORDER — METOPROLOL TARTRATE 25 MG PO TABS
25.0000 mg | ORAL_TABLET | Freq: Two times a day (BID) | ORAL | Status: DC
  Administered 2016-08-07 – 2016-08-09 (×3): 25 mg via ORAL
  Filled 2016-08-06 (×4): qty 1

## 2016-08-06 NOTE — ED Provider Notes (Signed)
[facility]-EMER[facility]ENCY DEPT Provider Note   CSN: [id number] Arrival date & time: 08/06/16  0757     History   Chief Complaint Chief Complaint  Patient presents with  . Code Stroke    HPI Angela Keith is a 83 y.o. female.  83 year old female called out to EMS for a fall. Prior to EMS crews to caregiver between the 2 she had 3 episodes of dysarthria, right-sided facial droop that was pretty dramatic. She has a history of stroke however these episodes resolved prior to arrival here. Patient was speaking clearly with a minor right facial droop which was not abnormal for her. Also has some left-sided residual deficits from a previous stroke which is not abnormal for her. No recent illnesses as far as anyone knows. She is on eliquis and multiple other medications. On arrival here patient is dysarthric with possible aphasia as well as was not able supply any history or review of systems.      Past Medical History:  Diagnosis Date  . Arthritis   . Atrial fibrillation (HCC)   . Chronic neck pain   . DJD (degenerative joint disease), cervical   . DJD (degenerative joint disease), lumbar   . Dysphagia   . GERD (gastroesophageal reflux disease)   . Glaucoma   . HTN (hypertension) 10/30/2012  . Hyperlipidemia   . Hypothyroidism   . Insomnia   . Nocturia   . Right bundle branch block 03/21/2014  . Rotoscoliosis   . Severe hearing loss   . Shortness of breath dyspnea   . Stroke (HCC)   . Unspecified hypothyroidism 10/30/2012    Patient Active Problem List   Diagnosis Date Noted  . Depression 02/03/2016  . Insomnia 02/03/2016  . Chronic musculoskeletal pain 02/03/2016  . GERD (gastroesophageal reflux disease) 12/31/2015  . Pressure ulcer 12/23/2015  . Rhabdomyolysis 12/22/2015  . Atrial fibrillation (HCC) 06/06/2015  . Embolic stroke (HCC) 03/22/2015  . Left hemiparesis (HCC)   . Cryptogenic stroke (HCC)   . H/O TIA (transient ischemic attack) and stroke   . Spinal stenosis, lumbar  region, with neurogenic claudication 01/17/2015  . Lumbar stenosis 01/17/2015  . 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 Essential hypertension 11/10/2014  . HLD (hyperlipidemia) 11/10/2014  . Chronic back pain 11/08/2014  . Cerebral infarction due to thrombosis of cerebral artery (HCC)   . Hyperlipidemia LDL goal <70 09/07/2014  . Presumed CVA (cerebral infarction) s/p IV tPA, MRI negative 09/05/2014  . Hypertensive urgency 09/05/2014  . Right bundle branch block 03/21/2014  . Cough 10/31/2012  . Nausea vomiting and diarrhea 10/30/2012  . Elevated lipase 10/30/2012  . HTN (hypertension) 10/30/2012  . Hypothyroidism 10/30/2012    Past Surgical History:  Procedure Laterality Date  . BACK SURGERY     lower back   . bladder laser surgery    . BREAST SURGERY    . cataract surgery    . EP IMPLANTABLE DEVICE N/A 01/19/2015   Procedure: Loop Recorder Insertion;  Surgeon: Marinus Maw, MD;  Location: [facility] INVASIVE CV LAB;  Service: Cardiovascular;  Laterality: N/A;  . ESOPHAGOGASTRODUODENOSCOPY (EGD) WITH ESOPHAGEAL DILATION  06/08/2012   Procedure: ESOPHAGOGASTRODUODENOSCOPY (EGD) WITH ESOPHAGEAL DILATION;  Surgeon: Charolett Bumpers, MD;  Location: [facility] ENDOSCOPY;  Service: Endoscopy;  Laterality: N/A;  . facail surgery  2000   facelift and eyelid lift  . left carpal tunnel surgery    . LUMBAR LAMINECTOMY/DECOMPRESSION MICRODISCECTOMY N/A 01/17/2015   Procedure: Laminectomy and Foraminotomy - L2-L3 - L3-L4;  Surgeon: [name], MD;  Location: [facility] NEURO ORS;  Service: Neurosurgery;  Laterality: N/A;  Laminectomy and Foraminotomy - L2-L3 - L3-L4  . TEE WITHOUT CARDIOVERSION N/A 01/19/2015   Procedure: TRANSESOPHAGEAL ECHOCARDIOGRAM (TEE);  Surgeon: Chrystie Nose, MD;  Location: [facility] ENDOSCOPY;  Service: Cardiovascular;  Laterality: N/A;    OB History    No data available       Home Medications    Prior to Admission medications   Medication Sig Start Date End Date Taking?  Authorizing Provider  acetaminophen (TYLENOL) 325 MG tablet Take 650 mg by mouth every 6 (six) hours as needed for mild pain or moderate pain.    Historical Provider, MD  apixaban (ELIQUIS) 5 MG TABS tablet Take 1 tablet (5 mg total) by mouth 2 (two) times daily. 06/06/15   Marinus Maw, MD  baclofen (LIORESAL) 10 MG tablet Take 1 tablet by mouth 3 times daily and take 2 tablets by mouth at bedtime.    Historical Provider, MD  benzonatate (TESSALON) 100 MG capsule Take 1 capsule (100 mg total) by mouth 3 (three) times daily as needed for cough. 03/15/15   Sharon Seller, NP  Biotin 1000 MCG tablet Take 1,000 mcg by mouth daily.    Historical Provider, MD  Cyanocobalamin (VITAMIN B-12 SL) Place 1 tablet under the tongue daily.     Historical Provider, MD  dorzolamide-timolol (COSOPT) 22.3-6.8 MG/ML ophthalmic solution Place 1 drop into both eyes 2 (two) times daily.    Historical Provider, MD  gabapentin (NEURONTIN) 300 MG capsule Take 300 mg by mouth at bedtime.     Historical Provider, MD  HYDROcodone-acetaminophen (NORCO/VICODIN) 5-325 MG tablet Take 1 tablet by mouth 2 (two) times daily as needed for moderate pain or severe pain. Take one tablet by mouth at night as needed and take 1 tablet twice daily as needed for pain    Historical Provider, MD  latanoprost (XALATAN) 0.005 % ophthalmic solution Place 1 drop into both eyes at bedtime.    Historical Provider, MD  levothyroxine (SYNTHROID, LEVOTHROID) 88 MCG tablet Take 88 mcg by mouth daily before breakfast.    Historical Provider, MD  lidocaine (LIDODERM) 5 % Place 1 patch onto the skin daily. Left lateral thigh  Remove & Discard patch within 12 hours or as directed by MD    Historical Provider, MD  loratadine (CLARITIN) 10 MG tablet Take 10 mg by mouth daily as needed for allergies.     Historical Provider, MD  metoprolol tartrate (LOPRESSOR) 25 MG tablet Take 25 mg by mouth 2 (two) times daily.    Historical Provider, MD  mirtazapine  (REMERON) 15 MG tablet Take 15 mg by mouth at bedtime.    Historical Provider, MD  Nutritional Supplements (ENSURE CLEAR) LIQD Take 237 mLs by mouth 2 (two) times daily between meals.    Historical Provider, MD  pantoprazole (PROTONIX) 40 MG tablet Take 40 mg by mouth 2 (two) times daily.    Historical Provider, MD  tamsulosin (FLOMAX) 0.4 MG CAPS capsule Take 0.4 mg by mouth daily.    Historical Provider, MD  traZODone (DESYREL) 50 MG tablet Take 50 mg by mouth at bedtime.    Historical Provider, MD    Family History Family History  Problem Relation Age of Onset  . CAD Father   . Stroke Father   . Diabetes Mother   . Lung cancer Other   . CAD Brother     Social History Social History  Substance  Use Topics  . Smoking status: Never Smoker  . Smokeless tobacco: Not on file  . Alcohol use No     Allergies   Ace inhibitors; Lipitor [atorvastatin]; and Motrin [ibuprofen]   Review of Systems Review of Systems  Unable to perform ROS: Other     Physical Exam Updated Vital Signs There were no vitals taken for this visit.  Physical Exam  Constitutional: She appears well-developed and well-nourished.  HENT:  Head: Normocephalic and atraumatic.  Eyes: Conjunctivae and EOM are normal.  Neck: Normal range of motion.  Cardiovascular: Normal rate and regular rhythm.   Pulmonary/Chest: No stridor. No respiratory distress.  Abdominal: Soft. She exhibits no distension.  Musculoskeletal: She exhibits no edema or deformity.  Neurological: She is alert. A cranial nerve deficit (right droop) is present. GCS eye subscore is 4. GCS verbal subscore is 2. GCS motor subscore is 6.  Skin: Skin is warm and dry.  Nursing note and vitals reviewed.    ED Treatments / Results  Labs (all labs ordered are listed, but only abnormal results are displayed) Labs Reviewed  ETHANOL  PROTIME-INR  APTT  CBC  DIFFERENTIAL  COMPREHENSIVE METABOLIC PANEL  RAPID URINE DRUG SCREEN, HOSP PERFORMED    URINALYSIS, ROUTINE W REFLEX MICROSCOPIC  I-STAT CHEM 8, ED  I-STAT TROPOININ, ED  CBG MONITORING, ED    EKG  EKG Interpretation None       Radiology No results found.  Procedures Procedures (including critical care time)  Medications Ordered in ED Medications - No data to display   Initial Impression / Assessment and Plan / ED Course  I have reviewed the triage vital signs and the nursing notes.  Pertinent labs & imaging results that were available during my care of the patient were reviewed by me and considered in my medical decision making (see chart for details).  Sounds like multiple TIA's en route. Was speaking to medic clearly on arrival here. On my exam approximately 5 minutes later is once again dysarthric/aphasic? With right facial droop. Code stroke initiated. Will also workup for general infectious and metabolic causes for possible stroke reactivation syndrome.   Neuro saw. Likely stroke. No interventions indicated. Plan for medicine admission.   Final Clinical Impressions(s) / ED Diagnoses   Final diagnoses:  Aphasia  Stroke (cerebrum) [facility](HCC)  Bladder outlet obstruction     Marily MemosJason Giovany Cosby, MD 08/07/16 1527

## 2016-08-06 NOTE — Code Documentation (Signed)
83 y.o. [facility]. The patient lives alone and was noted to be in her normal state of health last night at 1730 when her son saw her last. The patient woke up this morning and was attempting to use the restroom. She then slipped from her wheelchair and fell to the ground. She then used her medical alert button located on her necklace to contact family. The daughter-in-law responded at 0630 and found her laying in the floor with weakness and garbled speech. Ems was then called. Upon EMS arrival the patient had no symptoms and had returned to baseline. [facility] stated the patient's s/s returned. [facility], the patient had no acute symptoms and the patient was roomed in the ED. Upon EDP assessment, the patient's s/s had returned. EDP stated to call a code stroke. No code stroke was called. Neurologist and Stroke RN were in CT with other code stroke patient and found out about this patient. Labs were drawn and CT head was performed. CTH showed suspicious hyperdensity at the left ICA terminus and suspect subtle loss of gray-white differentiation in the posterior left MCA territory. No acute hemorrhage or intracranial mass effect. ASPECTS is 9. Chronic posterior right ACA infarct. NIHSS 7. See EMR for NIHSS and code stroke times. Pt currently slightly confused, mild right facial droop, RLE drift and mild to moderate dysarthria. Pt also has limited movement in her LLE which is said to be residual effect from a prior CVA in 2016. During my time in ED, the pt's speech was waxing and waning. No tPA d/t being out of the window and pt currently taking apixaban. CTA and CTP performed. CTP is negative for evidence of core infarct and CTA is negative for emergent large vessel occlusion. Per neurologist, pt to have MRI and EEG. Bedside handoff with ED RN Chestine Sporelark.

## 2016-08-06 NOTE — ED Notes (Signed)
Checked on patient and patient is still attempting to urinate; daughter at bedside and stated to me she will press she call bell when her mother is finished

## 2016-08-06 NOTE — Procedures (Addendum)
ELECTROENCEPHALOGRAM REPORT  Date of Study: 08/06/2016  Patient's Name: Angela Keith MRN: [id number] Date of Birth: 05-18-1930  Referring Provider: Dr. Ritta SlotMcNeill Kirkpatrick  Clinical History: This is an 83 year old woman with dysarthria, possible aphasia, facial droop.  Medications: Outpatient Medications amitriptyline (ELAVIL) 50 MG tablet  apixaban (ELIQUIS) 5 MG TABS tablet  baclofen (LIORESAL) 10 MG tablet  dorzolamide-timolol (COSOPT) 22.3-6.8 MG/ML ophthalmic solution  gabapentin (NEURONTIN) 100 MG capsule  gabapentin (NEURONTIN) 300 MG capsule  HYDROcodone-acetaminophen (NORCO/VICODIN) 5-325 MG tablet  latanoprost (XALATAN) 0.005 % ophthalmic solution  levothyroxine (SYNTHROID, LEVOTHROID) 88 MCG tablet  lidocaine (LIDODERM) 5 %  metoprolol tartrate (LOPRESSOR) 25 MG tablet  mirtazapine (REMERON) 15 MG tablet  Nutritional Supplements (ENSURE CLEAR) LIQD  pantoprazole (PROTONIX) 40 MG tablet  tamsulosin (FLOMAX) 0.4 MG CAPS capsule  acetaminophen (TYLENOL) 325 MG tablet  benzonatate (TESSALON) 100 MG capsule  Biotin 1000 MCG tablet  Cyanocobalamin (VITAMIN B-12 SL)  loratadine (CLARITIN) 10 MG tablet   Technical Summary: A multichannel digital EEG recording measured by the international 10-20 system with electrodes applied with paste and impedances below 5000 ohms performed in our laboratory with EKG monitoring in an awake patient.  Hyperventilation and photic stimulation were not performed.  The digital EEG was referentially recorded, reformatted, and digitally filtered in a variety of bipolar and referential montages for optimal display.    Description: The patient is awake during the recording.  During maximal wakefulness, there is a symmetric, medium voltage 10 Hz posterior dominant rhythm that attenuates with eye opening.  The record is symmetric.  Sleep was not captured. Hyperventilation and photic stimulation were not performed. She is noted to have leg twitching  during the study with no electrographic correlate. There were no epileptiform discharges or electrographic seizures seen.    EKG lead showed irregular rhythm.  Impression: This awake EEG is normal.    Clinical Correlation: A normal EEG does not exclude a clinical diagnosis of epilepsy. Clinical correlation is advised.   Patrcia DollyKaren Lemont Sitzmann, M.D.

## 2016-08-06 NOTE — ED Notes (Signed)
Myself and Dewitt HoesBriahnna, NT cleaned patient for patient was over in MRI and patient stated "I told them I had to use the bathroom but they wouldn't take me"; patient, her gown and her stretcher linens were wet; washed patient's groin area, placed a diaper on patient, changed stretcher linens, placed 3 chuks underneath patient and placed a clean gown on patient; visitors stayed at bedside for event

## 2016-08-06 NOTE — ED Notes (Signed)
Placed patient on bedpan with assistance from patient's daughter

## 2016-08-06 NOTE — ED Notes (Signed)
Pt.wanted to use bed pan she voided 50c.assit pt.to bedside commode the patient.voided 250c. bladdlerscan pt.she had 999ml.in bladder reported to nurse and doctor about pt.not emptying her bladder.inand out order wasin .cath pt.for total of.97250ml.

## 2016-08-06 NOTE — ED Triage Notes (Signed)
See note from stroke team at [phone].

## 2016-08-06 NOTE — Progress Notes (Signed)
Bedside EEG completed in ED.  Results pending. 

## 2016-08-06 NOTE — Consult Note (Signed)
Neurology Consultation Reason for Consult: Garbled Speech Referring Physician: Clayborne Dana, J  CC: Garbled speech  History is obtained from: Daughter  HPI: Angela Keith is a 83 y.o. female last known well at 5:30 PM last night. She called her daughter this morning and said help me, but was not making sense otherwise. Her daughter went over and found her on the ground having fallen out of the wheelchair. She noticed that she was having trouble with her speech and noticed right facial droop as well. Due to this EMS was called, she initially had some improvement but then waxed and waned in the ED. Therefore a code stroke was called.  Due to her symptoms, CT perfusion was obtained which was negative.  Also of note, her daughter notes that she has been having worsening spasms of her left leg which is unusual for her, though they do happen from time to time at night.  LKW: 5:30 PM yesterday tpa given?: no, out of window    ROS: A 14 point ROS was performed and is negative except as noted in the HPI.   Past Medical History:  Diagnosis Date  . Arthritis   . Atrial fibrillation (HCC)   . Chronic neck pain   . DJD (degenerative joint disease), cervical   . DJD (degenerative joint disease), lumbar   . Dysphagia   . GERD (gastroesophageal reflux disease)   . Glaucoma   . HTN (hypertension) 10/30/2012  . Hyperlipidemia   . Hypothyroidism   . Insomnia   . Nocturia   . Right bundle branch block 03/21/2014  . Rotoscoliosis   . Severe hearing loss   . Shortness of breath dyspnea   . Stroke (HCC)   . Unspecified hypothyroidism 10/30/2012     Family History  Problem Relation Age of Onset  . CAD Father   . Stroke Father   . Diabetes Mother   . Lung cancer Other   . CAD Brother      Social History:  reports that she has never smoked. She does not have any smokeless tobacco history on file. She reports that she does not drink alcohol or use drugs.   Exam: Current vital signs: BP (!)  171/121   Pulse 91   Temp 97.7 F (36.5 C) (Oral)   Resp 16   SpO2 95%  Vital signs in last 24 hours: Temp:  [97.7 F (36.5 C)] 97.7 F (36.5 C) (01/30 0801) Pulse Rate:  [81-91] 91 (01/30 0900) Resp:  [16-20] 16 (01/30 0900) BP: (170-186)/(85-121) 171/121 (01/30 0900) SpO2:  [95 %-100 %] 95 % (01/30 0900)   Physical Exam  Constitutional: Appears Elderly Psych: Affect appropriate to situation Eyes: No scleral injection HENT: No OP obstrucion Head: Normocephalic.  Cardiovascular: Normal rate and regular rhythm.  Respiratory: Effort normal and breath sounds normal to anterior ascultation GI: Soft.  No distension. There is no tenderness.  Skin: WDI  Neuro: Mental Status: Patient is awake, alert, oriented to person, place. She is very hard of hearing, but I do think that she does have some mild aphasia as well. Commands have to be repeated multiple times and her speech is not always formed words. Cranial Nerves: II: Visual Fields are full. Pupils are equal, round, and reactive to light.   III,IV, VI: EOMI without ptosis or diploplia.  V: Facial sensation is symmetric to touch VII: Facial movement is notable for decreased nasolabial fold on the right VIII: hearing is intact to voice X: Uvula elevates symmetrically XI:  Shoulder shrug is symmetric. XII: tongue is midline without atrophy or fasciculations.  Motor: Tone is normal. Bulk is normal. Has a mild left chronic hemi-paresis Sensory: Sensation is symmetric to light touch and temperature in the arms and legs. Plantars: Toes are downgoing on the right, up on the left.  Cerebellar: No clear ataxia   I have reviewed labs in epic and the results pertinent to this consultation are: Cr 0.9  I have reviewed the images obtained:CTA - no LVO  Impression: 83 year old female with dysarthria, possible aphasia and facial droop. I do suspect that she has had recurrent stroke. I would favor holding the apixaban until her MRI is  completed.   Recommendations: 1. HgbA1c, fasting lipid panel 2. MRI, MRA  of the brain without contrast 3. Frequent neuro checks 4. Echocardiogram 5. Carotid dopplers 6. Prophylactic therapy-Antiplatelet med: Aspirin - dose 325mg  PO or 300mg  PR 7. Risk factor modification 8. Telemetry monitoring 9. PT consult, OT consult, Speech consult 10. please page stroke NP  Or  PA  Or MD  from 8am -4 pm starting 1/31 as this patient will be followed by the stroke team at this point.   You can look them up on [email or web address]     Ritta SlotMcNeill Livingston Denner, MD [facility] [phone]  If 7pm- 7am, please page neurology on call as listed in AMION.

## 2016-08-06 NOTE — H&P (Signed)
History and Physical    Angela Keith ZOX:[id number] DOB: 11/09/1929 DOA: 08/06/2016   PCP: [name], MD   Patient coming from/Resides with: Private residence/lives alone but has caregivers.  Admission status: Inpatient/telemetry -medically necessary to stay a minimum 2 midnights to rule out impending and/or unexpected changes in physiologic status that may differ from initial evaluation performed in the ER and/or at time of admission. Presents with right facial drooping, dysarthria, known previous CVA with left-sided deficits. Imaging consistent with acute left MCA tear toward stroke. Patient regarding inpatient PT/OT evaluation as well as SLP evaluation regarding dysarthria.  Chief Complaint: Strokelike symptoms  HPI: Angela Keith is a 83 y.o. 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 Last seen normal around 5:30 yesterday evening. Caregiver at the home noted this morning patient has had 2-3 episodes of dysarthria associated right facial drooping. In addition, patient was attempting to use the restroom and upon standing to try to pull up her undergarments she slid to the floor. She denied dizziness. She has had recent issues with worsening spasticity of the left lower extremity over the past several days. EMS was called to the home and by the time they arrived the symptoms had resolved. Upon patient to the ER symptoms had recurred. Code stroke was initiated. Neurology has seen the patient and deemed her not appropriate for TPA based on severity of symptoms and concurrent utilization of eliquis anticoagulation. Imaging consistent with likely left MCA territory CVA.  ED Course:  Vital Signs: BP (!) 171/121   Pulse 91   Temp 97.7 F  (36.5 C) (Oral)   Resp 16   SpO2 95%  CT head, Code Stroke protocol: Suspicious hypodensity posterior left MCA concerning for CVA CT angiogram head and neck with cerebral perfusion: CT perfusion negative for evidence of cor infarct or emergent large vessel occlusion with neurologist final interpretation pending; moderate to severe chronic intracranial atherosclerosis and stenosis with chronic occlusion of the distal right ACA. Stable proximal stenosis and intracranial MRA. Moderate bilateral MCA M1 segment stenosis and moderate right P1 and P2 segment stenosis. Moderate distal branch irregularity was stenosis in the left PCA, left ACA and right greater than left MCA territories Lab data: Sodium 142, potassium 4.0, chloride 107, CO2 31, glucose 105, BUN 15, creatinine 0.93, LFTs normal, poc troponin 0.00, white count 6600 with normal differential, hemoglobin 12.8, platelets 246,000, coags negative, alcohol level less than 5, urinalysis and urine drug screen pending collection of specimen Medications and treatments: None  Review of Systems:  In addition to the HPI above,  No Fever-chills, myalgias or other constitutional symptoms No Headache, changes with Vision or hearing, new weakness, tingling, numbness in any extremity, dizziness, no change in chronic gait disturbance or imbalance, tremors or seizure activity No problems swallowing food or Liquids, indigestion/reflux, choking or coughing while eating, abdominal pain with or after eating No Chest pain, Cough or Shortness of Breath, palpitations, orthopnea or DOE No Abdominal pain, N/V, melena,hematochezia, dark tarry stools, constipation No dysuria, malodorous urine, hematuria or flank pain No new skin rashes, lesions, masses or bruises, No new joint pains, aches, swelling or redness No recent unintentional weight gain or loss No polyuria, polydypsia or polyphagia   Past Medical History:  Diagnosis Date  . Arthritis   . Atrial fibrillation  (HCC)   .  Chronic neck pain   . DJD (degenerative joint disease), cervical   . DJD (degenerative joint disease), lumbar   . Dysphagia   . GERD (gastroesophageal reflux disease)   . Glaucoma   . HTN (hypertension) 10/30/2012  . Hyperlipidemia   . Hypothyroidism   . Insomnia   . Nocturia   . Right bundle branch block 03/21/2014  . Rotoscoliosis   . Severe hearing loss   . Shortness of breath dyspnea   . Stroke (HCC)   . Unspecified hypothyroidism 10/30/2012    Past Surgical History:  Procedure Laterality Date  . BACK SURGERY     lower back   . bladder laser surgery    . BREAST SURGERY    . cataract surgery    . EP IMPLANTABLE DEVICE N/A 01/19/2015   Procedure: Loop Recorder Insertion;  Surgeon: Marinus Maw, MD;  Location: [facility] INVASIVE CV LAB;  Service: Cardiovascular;  Laterality: N/A;  . ESOPHAGOGASTRODUODENOSCOPY (EGD) WITH ESOPHAGEAL DILATION  06/08/2012   Procedure: ESOPHAGOGASTRODUODENOSCOPY (EGD) WITH ESOPHAGEAL DILATION;  Surgeon: Charolett Bumpers, MD;  Location: [facility] ENDOSCOPY;  Service: Endoscopy;  Laterality: N/A;  . facail surgery  2000   facelift and eyelid lift  . left carpal tunnel surgery    . LUMBAR LAMINECTOMY/DECOMPRESSION MICRODISCECTOMY N/A 01/17/2015   Procedure: Laminectomy and Foraminotomy - L2-L3 - L3-L4;  Surgeon: [name], MD;  Location: [facility] NEURO ORS;  Service: Neurosurgery;  Laterality: N/A;  Laminectomy and Foraminotomy - L2-L3 - L3-L4  . TEE WITHOUT CARDIOVERSION N/A 01/19/2015   Procedure: TRANSESOPHAGEAL ECHOCARDIOGRAM (TEE);  Surgeon: Chrystie Nose, MD;  Location: [facility] ENDOSCOPY;  Service: Cardiovascular;  Laterality: N/A;    Social History   Social History  . Marital status: Married    Spouse name: widowed now  . Number of children: N/A  . Years of education: 10   Occupational History  . retired     Veterinary surgeon, office work   Social History Main Topics  . Smoking status: Never Smoker  . Smokeless tobacco: Not on file  . Alcohol use No    . Drug use: No  . Sexual activity: No   Other Topics Concern  . Not on file   Social History Narrative   Widowed, 2 sons   Right handed   caffeine use- none    Mobility: Wheelchair-with 2+ maximum assist able to ambulate with a rolling walker Work history: Not obtained   Allergies  Allergen Reactions  . Ace Inhibitors Other (See Comments)    Severe fatigue  . Lipitor [Atorvastatin] Other (See Comments)    Mood swings  . Motrin [Ibuprofen] Nausea And Vomiting    Family History  Problem Relation Age of Onset  . CAD Father   . Stroke Father   . Diabetes Mother   . Lung cancer Other   . CAD Brother      Prior to Admission medications   Medication Sig Start Date End Date Taking? Authorizing Provider  acetaminophen (TYLENOL) 325 MG tablet Take 650 mg by mouth every 6 (six) hours as needed for mild pain or moderate pain.    Historical Provider, MD  apixaban (ELIQUIS) 5 MG TABS tablet Take 1 tablet (5 mg total) by mouth 2 (two) times daily. 06/06/15   Marinus Maw, MD  baclofen (LIORESAL) 10 MG tablet Take 1 tablet by mouth 3 times daily and take 2 tablets by mouth at bedtime.    Historical Provider, MD  benzonatate (TESSALON) 100 MG capsule Take 1  capsule (100 mg total) by mouth 3 (three) times daily as needed for cough. 03/15/15   Sharon Seller, NP  Biotin 1000 MCG tablet Take 1,000 mcg by mouth daily.    Historical Provider, MD  Cyanocobalamin (VITAMIN B-12 SL) Place 1 tablet under the tongue daily.     Historical Provider, MD  dorzolamide-timolol (COSOPT) 22.3-6.8 MG/ML ophthalmic solution Place 1 drop into both eyes 2 (two) times daily.    Historical Provider, MD  gabapentin (NEURONTIN) 300 MG capsule Take 300 mg by mouth at bedtime.     Historical Provider, MD  HYDROcodone-acetaminophen (NORCO/VICODIN) 5-325 MG tablet Take 1 tablet by mouth 2 (two) times daily as needed for moderate pain or severe pain. Take one tablet by mouth at night as needed and take 1 tablet  twice daily as needed for pain    Historical Provider, MD  latanoprost (XALATAN) 0.005 % ophthalmic solution Place 1 drop into both eyes at bedtime.    Historical Provider, MD  levothyroxine (SYNTHROID, LEVOTHROID) 88 MCG tablet Take 88 mcg by mouth daily before breakfast.    Historical Provider, MD  lidocaine (LIDODERM) 5 % Place 1 patch onto the skin daily. Left lateral thigh  Remove & Discard patch within 12 hours or as directed by MD    Historical Provider, MD  loratadine (CLARITIN) 10 MG tablet Take 10 mg by mouth daily as needed for allergies.     Historical Provider, MD  metoprolol tartrate (LOPRESSOR) 25 MG tablet Take 25 mg by mouth 2 (two) times daily.    Historical Provider, MD  mirtazapine (REMERON) 15 MG tablet Take 15 mg by mouth at bedtime.    Historical Provider, MD  Nutritional Supplements (ENSURE CLEAR) LIQD Take 237 mLs by mouth 2 (two) times daily between meals.    Historical Provider, MD  pantoprazole (PROTONIX) 40 MG tablet Take 40 mg by mouth 2 (two) times daily.    Historical Provider, MD  tamsulosin (FLOMAX) 0.4 MG CAPS capsule Take 0.4 mg by mouth daily.    Historical Provider, MD  traZODone (DESYREL) 50 MG tablet Take 50 mg by mouth at bedtime.    Historical Provider, MD    Physical Exam: Vitals:   08/06/16 0801 08/06/16 0815 08/06/16 0830 08/06/16 0900  BP: (!) 186/102 177/85 170/85 (!) 171/121  Pulse: 91 83 83 91  Resp: 20 18 19 16   Temp: 97.7 F (36.5 C)     TempSrc: Oral     SpO2: 96% 100% 99% 95%      Constitutional: NAD, calm, uncomfortable secondary to need to avoid Eyes: PERRL, lids and conjunctivae normal ENMT: Mucous membranes are dry. Posterior pharynx clear of any exudate or lesions. partially edentulous. Neck: normal, supple, no masses, no thyromegaly Respiratory: clear to auscultation bilaterally, no wheezing, no crackles. Normal respiratory effort. No accessory muscle use.  Cardiovascular: Regular rate and rhythm, no murmurs / rubs / gallops.  No extremity edema. 2+ pedal pulses. No carotid bruits.  Abdomen: Mildly tender suprapubic area, no masses palpated. No hepatosplenomegaly. Bowel sounds positive.  Musculoskeletal: no clubbing / cyanosis. No joint deformity upper and lower extremities. Good ROM, no contractures. Normal muscle tone.  Skin: no rashes, lesions, ulcers. No induration Neurologic: CN 2-12 grossly intact except for right facial drooping. Notable dysarthria which is being negatively influenced by poor dentition and partial plate being removed from mouth. Sensation intact, DTR normal. Strength 5/5 x on the right and 3-4/5 on the left which is chronic since previous stroke  Psychiatric:  Appears to have normal judgment and insight but difficult to definitively clarified given associated dysarthria. Pierced to be alert and oriented x 3. Normal mood.    Labs on Admission: I have personally reviewed following labs and imaging studies  CBC:  Recent Labs Lab 08/06/16 0759 08/06/16 0812  WBC 6.6  --   NEUTROABS 3.3  --   HGB 12.8 14.3  HCT 40.3 42.0  MCV 90.0  --   PLT 246  --    Basic Metabolic Panel:  Recent Labs Lab 08/06/16 0759 08/06/16 0812  NA 142 145  K 4.0 3.9  CL 107 103  CO2 31  --   GLUCOSE 105* 103*  BUN 15 19  CREATININE 0.93 1.00  CALCIUM 9.7  --    GFR: CrCl cannot be calculated (Unknown ideal weight.). Liver Function Tests:  Recent Labs Lab 08/06/16 0759  AST 22  ALT 18  ALKPHOS 65  BILITOT 0.3  PROT 7.5  ALBUMIN 3.9   No results for input(s): LIPASE, AMYLASE in the last 168 hours. No results for input(s): AMMONIA in the last 168 hours. Coagulation Profile:  Recent Labs Lab 08/06/16 0759  INR 0.95   Cardiac Enzymes: No results for input(s): CKTOTAL, CKMB, CKMBINDEX, TROPONINI in the last 168 hours. BNP (last 3 results) No results for input(s): PROBNP in the last 8760 hours. HbA1C: No results for input(s): HGBA1C in the last 72 hours. CBG: No results for input(s):  GLUCAP in the last 168 hours. Lipid Profile: No results for input(s): CHOL, HDL, LDLCALC, TRIG, CHOLHDL, LDLDIRECT in the last 72 hours. Thyroid Function Tests: No results for input(s): TSH, T4TOTAL, FREET4, T3FREE, THYROIDAB in the last 72 hours. Anemia Panel: No results for input(s): VITAMINB12, FOLATE, FERRITIN, TIBC, IRON, RETICCTPCT in the last 72 hours. Urine analysis:    Component Value Date/Time   COLORURINE YELLOW 12/22/2015 1648   APPEARANCEUR CLEAR 12/22/2015 1648   LABSPEC 1.012 12/22/2015 1648   PHURINE 7.5 12/22/2015 1648   GLUCOSEU NEGATIVE 12/22/2015 1648   HGBUR TRACE (A) 12/22/2015 1648   BILIRUBINUR NEGATIVE 12/22/2015 1648   KETONESUR NEGATIVE 12/22/2015 1648   PROTEINUR NEGATIVE 12/22/2015 1648   UROBILINOGEN 1.0 01/25/2015 0547   NITRITE NEGATIVE 12/22/2015 1648   LEUKOCYTESUR NEGATIVE 12/22/2015 1648   Sepsis Labs: @LABRCNTIP (procalcitonin:4,lacticidven:4) )No results found for this or any previous visit (from the past 240 hour(s)).   Radiological Exams on Admission: Ct Angio Head W Or Wo Contrast  Result Date: 08/06/2016 CLINICAL DATA:  83 year old female with right facial droop and aphasia. Code stroke. Personal history of distal right ACA infarct and intracranial atherosclerosis. Initial encounter. EXAM: CT PERFUSION BRAIN CT ANGIOGRAPHY HEAD AND NECK TECHNIQUE: Multiphase CT imaging of the brain was performed following IV bolus contrast injection. Subsequent parametric perfusion maps were calculated using RAPID software. Multidetector CT imaging of the head and neck was performed using the standard protocol during bolus administration of intravenous contrast. Multiplanar CT image reconstructions and MIPs were obtained to evaluate the vascular anatomy. Carotid stenosis measurements (when applicable) are obtained utilizing NASCET criteria, using the distal internal carotid diameter as the denominator. CONTRAST:  100 mL Isovue 370 COMPARISON:  Head CT without  contrast today 0815 hours. Brain MRI and intracranial MRA 01/18/2015. FINDINGS: CT BRAIN PERFUSION FINDINGS CBF (<30%) Volume:  0 mL Perfusion (Tmax>6.0s) volume: 4 mL (likely artifactual) Mismatch Volume: Not applicable Infarction Location: Not applicable CTA NECK FINDINGS Skeleton: Advanced degenerative changes in the cervical spine. No acute osseous abnormality identified. Upper chest: No  acute apical lung findings aside from mild atelectasis. No superior mediastinal lymphadenopathy. Other neck: Diminutive or absent thyroid. Negative larynx, pharynx, parapharyngeal spaces, retropharyngeal space, sublingual space, submandibular glands and parotid glands. No cervical lymphadenopathy. Aortic arch: 3 vessel arch configuration. Mild to moderate mostly distal arch calcified atherosclerosis. Right carotid system: No brachiocephalic artery stenosis. Tortuous right CCA origin and proximal right CCA with a kinked appearance but no other stenosis. Minimal circumferential soft plaque in the right CCA. At the right carotid bifurcation there is mostly calcified plaque without stenosis. Mildly tortuous otherwise negative cervical right ICA. Left carotid system: Minimal plaque at the left CCA origin without stenosis. Tortuous left CCA. No stenosis proximal to the bifurcation. Mild calcified plaque at the bifurcation without stenosis. Mildly tortuous cervical left ICA. Vertebral arteries: Calcified plaque and tortuosity at the proximal right subclavian artery origin. Normal right vertebral artery origin. Tortuous but otherwise negative cervical right vertebral artery. Calcified plaque in the proximal left subclavian artery without stenosis. Normal left vertebral artery origin. Tortuous left V1 segment. Tortuous left V2 and V3 segments with otherwise negative cervical left vertebral artery. CTA HEAD Posterior circulation: Both distal vertebral arteries taper in the V4 segments without focal stenosis. Both PICA origins are patent.  Patent vertebrobasilar junction. No basilar stenosis. Normal SCA and left PCA origins. Mild to moderate irregularity of the right PCA origin and proximal P1 segment with stenosis. Similar right P2 segment findings (series 507, image 19). Mild left PCA irregularity. Moderate distal left PCA (P3 segment irregularity and stenosis (series 506, image 33). Posterior communicating arteries are diminutive or absent. Anterior circulation: Both ICA siphons are patent. There is moderate left siphon calcified plaque without significant stenosis. Similar moderate calcified plaque throughout the right ICA siphon with no significant stenosis. Patent carotid termini, MCA and ACA origins. The left ACA and a small anterior communicating artery are within normal limits. There is mild to moderate right A1 segment irregularity, and the right A2 segment is occluded proximal to the pericallosal artery level. There is mild to moderate distal left ACA branch irregularity (series 506, image 30). Right MCA M1 segment is mildly irregular. There is up to moderate mid right M1 segment irregularity and stenosis as seen on series 505, image 18 which is stable from the 2016 MRA. The right MCA bifurcation is patent. There is moderate right M2 branch irregularity and stenosis (series 506, image 22). The left mid M1 segment demonstrates moderate irregularity and stenosis (series 505, image 20 and series 504, image 176) which appears stable to the 2016 MRA. The left MCA bifurcation remains patent. There is only mild left M2 branch irregularity and stenosis. Venous sinuses: Patent. Anatomic variants: None. Review of the MIP images confirms the above findings IMPRESSION: 1. CT Perfusion is negative for evidence of core infarct and CTA is negative for emergent large vessel occlusion. A text page with preliminary findings of negative ELVO was sent to [facility] on 08/06/2016 at 0832 hours. 2. Generalized arterial tortuosity in the neck with mild  atherosclerosis and no stenosis. 3. More moderate to severe chronic intracranial atherosclerosis and stenosis with chronic occlusion of the distal right ACA. Significant proximal stenoses which appear stable to the 2016 intracranial MRA include - Moderate bilateral MCA M1 segment stenoses, and - Moderate Right P1 and P2 segments stenoses. 4. Up to moderate distal branch irregularity and stenosis in the left PCA, left ACA, and right greater than left MCA territories. Electronically Signed   By: Althea Grimmer.D.  On: 08/06/2016 09:06   Ct Angio Neck W Or Wo Contrast  Result Date: 08/06/2016 CLINICAL DATA:  83 year old female with right facial droop and aphasia. Code stroke. Personal history of distal right ACA infarct and intracranial atherosclerosis. Initial encounter. EXAM: CT PERFUSION BRAIN CT ANGIOGRAPHY HEAD AND NECK TECHNIQUE: Multiphase CT imaging of the brain was performed following IV bolus contrast injection. Subsequent parametric perfusion maps were calculated using RAPID software. Multidetector CT imaging of the head and neck was performed using the standard protocol during bolus administration of intravenous contrast. Multiplanar CT image reconstructions and MIPs were obtained to evaluate the vascular anatomy. Carotid stenosis measurements (when applicable) are obtained utilizing NASCET criteria, using the distal internal carotid diameter as the denominator. CONTRAST:  100 mL Isovue 370 COMPARISON:  Head CT without contrast today 0815 hours. Brain MRI and intracranial MRA 01/18/2015. FINDINGS: CT BRAIN PERFUSION FINDINGS CBF (<30%) Volume:  0 mL Perfusion (Tmax>6.0s) volume: 4 mL (likely artifactual) Mismatch Volume: Not applicable Infarction Location: Not applicable CTA NECK FINDINGS Skeleton: Advanced degenerative changes in the cervical spine. No acute osseous abnormality identified. Upper chest: No acute apical lung findings aside from mild atelectasis. No superior mediastinal lymphadenopathy. Other  neck: Diminutive or absent thyroid. Negative larynx, pharynx, parapharyngeal spaces, retropharyngeal space, sublingual space, submandibular glands and parotid glands. No cervical lymphadenopathy. Aortic arch: 3 vessel arch configuration. Mild to moderate mostly distal arch calcified atherosclerosis. Right carotid system: No brachiocephalic artery stenosis. Tortuous right CCA origin and proximal right CCA with a kinked appearance but no other stenosis. Minimal circumferential soft plaque in the right CCA. At the right carotid bifurcation there is mostly calcified plaque without stenosis. Mildly tortuous otherwise negative cervical right ICA. Left carotid system: Minimal plaque at the left CCA origin without stenosis. Tortuous left CCA. No stenosis proximal to the bifurcation. Mild calcified plaque at the bifurcation without stenosis. Mildly tortuous cervical left ICA. Vertebral arteries: Calcified plaque and tortuosity at the proximal right subclavian artery origin. Normal right vertebral artery origin. Tortuous but otherwise negative cervical right vertebral artery. Calcified plaque in the proximal left subclavian artery without stenosis. Normal left vertebral artery origin. Tortuous left V1 segment. Tortuous left V2 and V3 segments with otherwise negative cervical left vertebral artery. CTA HEAD Posterior circulation: Both distal vertebral arteries taper in the V4 segments without focal stenosis. Both PICA origins are patent. Patent vertebrobasilar junction. No basilar stenosis. Normal SCA and left PCA origins. Mild to moderate irregularity of the right PCA origin and proximal P1 segment with stenosis. Similar right P2 segment findings (series 507, image 19). Mild left PCA irregularity. Moderate distal left PCA (P3 segment irregularity and stenosis (series 506, image 33). Posterior communicating arteries are diminutive or absent. Anterior circulation: Both ICA siphons are patent. There is moderate left siphon  calcified plaque without significant stenosis. Similar moderate calcified plaque throughout the right ICA siphon with no significant stenosis. Patent carotid termini, MCA and ACA origins. The left ACA and a small anterior communicating artery are within normal limits. There is mild to moderate right A1 segment irregularity, and the right A2 segment is occluded proximal to the pericallosal artery level. There is mild to moderate distal left ACA branch irregularity (series 506, image 30). Right MCA M1 segment is mildly irregular. There is up to moderate mid right M1 segment irregularity and stenosis as seen on series 505, image 18 which is stable from the 2016 MRA. The right MCA bifurcation is patent. There is moderate right M2 branch irregularity and stenosis (series  506, image 22). The left mid M1 segment demonstrates moderate irregularity and stenosis (series 505, image 20 and series 504, image 176) which appears stable to the 2016 MRA. The left MCA bifurcation remains patent. There is only mild left M2 branch irregularity and stenosis. Venous sinuses: Patent. Anatomic variants: None. Review of the MIP images confirms the above findings IMPRESSION: 1. CT Perfusion is negative for evidence of core infarct and CTA is negative for emergent large vessel occlusion. A text page with preliminary findings of negative ELVO [facility] on 08/06/2016 at 0832 hours. 2. Generalized arterial tortuosity in the neck with mild atherosclerosis and no stenosis. 3. More moderate to severe chronic intracranial atherosclerosis and stenosis with chronic occlusion of the distal right ACA. Significant proximal stenoses which appear stable to the 2016 intracranial MRA include - Moderate bilateral MCA M1 segment stenoses, and - Moderate Right P1 and P2 segments stenoses. 4. Up to moderate distal branch irregularity and stenosis in the left PCA, left ACA, and right greater than left MCA territories. Electronically Signed    By: Odessa FlemingH  Hall M.D.   On: 08/06/2016 09:06   Ct Cerebral Perfusion W Contrast  Result Date: 08/06/2016 CLINICAL DATA:  83 year old female with right facial droop and aphasia. Code stroke. Personal history of distal right ACA infarct and intracranial atherosclerosis. Initial encounter. EXAM: CT PERFUSION BRAIN CT ANGIOGRAPHY HEAD AND NECK TECHNIQUE: Multiphase CT imaging of the brain was performed following IV bolus contrast injection. Subsequent parametric perfusion maps were calculated using RAPID software. Multidetector CT imaging of the head and neck was performed using the standard protocol during bolus administration of intravenous contrast. Multiplanar CT image reconstructions and MIPs were obtained to evaluate the vascular anatomy. Carotid stenosis measurements (when applicable) are obtained utilizing NASCET criteria, using the distal internal carotid diameter as the denominator. CONTRAST:  100 mL Isovue 370 COMPARISON:  Head CT without contrast today 0815 hours. Brain MRI and intracranial MRA 01/18/2015. FINDINGS: CT BRAIN PERFUSION FINDINGS CBF (<30%) Volume:  0 mL Perfusion (Tmax>6.0s) volume: 4 mL (likely artifactual) Mismatch Volume: Not applicable Infarction Location: Not applicable CTA NECK FINDINGS Skeleton: Advanced degenerative changes in the cervical spine. No acute osseous abnormality identified. Upper chest: No acute apical lung findings aside from mild atelectasis. No superior mediastinal lymphadenopathy. Other neck: Diminutive or absent thyroid. Negative larynx, pharynx, parapharyngeal spaces, retropharyngeal space, sublingual space, submandibular glands and parotid glands. No cervical lymphadenopathy. Aortic arch: 3 vessel arch configuration. Mild to moderate mostly distal arch calcified atherosclerosis. Right carotid system: No brachiocephalic artery stenosis. Tortuous right CCA origin and proximal right CCA with a kinked appearance but no other stenosis. Minimal circumferential soft plaque  in the right CCA. At the right carotid bifurcation there is mostly calcified plaque without stenosis. Mildly tortuous otherwise negative cervical right ICA. Left carotid system: Minimal plaque at the left CCA origin without stenosis. Tortuous left CCA. No stenosis proximal to the bifurcation. Mild calcified plaque at the bifurcation without stenosis. Mildly tortuous cervical left ICA. Vertebral arteries: Calcified plaque and tortuosity at the proximal right subclavian artery origin. Normal right vertebral artery origin. Tortuous but otherwise negative cervical right vertebral artery. Calcified plaque in the proximal left subclavian artery without stenosis. Normal left vertebral artery origin. Tortuous left V1 segment. Tortuous left V2 and V3 segments with otherwise negative cervical left vertebral artery. CTA HEAD Posterior circulation: Both distal vertebral arteries taper in the V4 segments without focal stenosis. Both PICA origins are patent. Patent vertebrobasilar junction. No basilar stenosis.  Normal SCA and left PCA origins. Mild to moderate irregularity of the right PCA origin and proximal P1 segment with stenosis. Similar right P2 segment findings (series 507, image 19). Mild left PCA irregularity. Moderate distal left PCA (P3 segment irregularity and stenosis (series 506, image 33). Posterior communicating arteries are diminutive or absent. Anterior circulation: Both ICA siphons are patent. There is moderate left siphon calcified plaque without significant stenosis. Similar moderate calcified plaque throughout the right ICA siphon with no significant stenosis. Patent carotid termini, MCA and ACA origins. The left ACA and a small anterior communicating artery are within normal limits. There is mild to moderate right A1 segment irregularity, and the right A2 segment is occluded proximal to the pericallosal artery level. There is mild to moderate distal left ACA branch irregularity (series 506, image 30). Right  MCA M1 segment is mildly irregular. There is up to moderate mid right M1 segment irregularity and stenosis as seen on series 505, image 18 which is stable from the 2016 MRA. The right MCA bifurcation is patent. There is moderate right M2 branch irregularity and stenosis (series 506, image 22). The left mid M1 segment demonstrates moderate irregularity and stenosis (series 505, image 20 and series 504, image 176) which appears stable to the 2016 MRA. The left MCA bifurcation remains patent. There is only mild left M2 branch irregularity and stenosis. Venous sinuses: Patent. Anatomic variants: None. Review of the MIP images confirms the above findings IMPRESSION: 1. CT Perfusion is negative for evidence of core infarct and CTA is negative for emergent large vessel occlusion. A text page with preliminary findings of negative ELVO was sent to [facility] on 08/06/2016 at 0832 hours. 2. Generalized arterial tortuosity in the neck with mild atherosclerosis and no stenosis. 3. More moderate to severe chronic intracranial atherosclerosis and stenosis with chronic occlusion of the distal right ACA. Significant proximal stenoses which appear stable to the 2016 intracranial MRA include - Moderate bilateral MCA M1 segment stenoses, and - Moderate Right P1 and P2 segments stenoses. 4. Up to moderate distal branch irregularity and stenosis in the left PCA, left ACA, and right greater than left MCA territories. Electronically Signed   By: Odessa Fleming M.D.   On: 08/06/2016 09:06   Dg Chest Portable 1 View  Result Date: 08/06/2016 CLINICAL DATA:  Stroke symptoms EXAM: PORTABLE CHEST 1 VIEW COMPARISON:  02/07/2015 FINDINGS: Normal cardiac silhouette. Improvement aeration lung bases. Small LEFT effusion remains. Chronic bronchitic markings. Hiatal hernia noted. IMPRESSION: 1. Hiatal hernia and LEFT effusion. 2. No pulmonary edema or infiltrate. Electronically Signed   By: Genevive Bi M.D.   On: 08/06/2016 09:13   Ct Head  Code Stroke W/o Cm  Result Date: 08/06/2016 CLINICAL DATA:  Code stroke. 83 year old female with aphasia and right facial droop. Initial encounter. EXAM: CT HEAD WITHOUT CONTRAST TECHNIQUE: Contiguous axial images were obtained from the base of the skull through the vertex without intravenous contrast. COMPARISON:  12/22/2015 and earlier. FINDINGS: Brain: Sequelae of posterior right ACA infarct with encephalomalacia in the posterosuperior right frontal lobe and medial right parietal lobe. No acute intracranial hemorrhage identified. No midline shift, mass effect, or evidence of intracranial mass lesion. No ventriculomegaly. There is subtle loss of gray-white matter differentiation in the posterior left MCA territory such as on series 21, image 21 in the posterior frontal and parietal lobe. Stable either dilated perivascular space or chronic lacunar infarct at the inferior left basal ganglia. Vascular: Calcified atherosclerosis at the skull base. Suspicious hyperdensity  at the left ICA terminus on coronal image 36. Skull: No acute osseous abnormality identified. Sinuses/Orbits: Visualized paranasal sinuses and mastoids are stable and well pneumatized. Other: No acute orbit or scalp soft tissue findings. ASPECTS [facility] Stroke Program Early CT Score) - Ganglionic level infarction (caudate, lentiform nuclei, internal capsule, insula, M1-M3 cortex): 7 - Supraganglionic infarction (M4-M6 cortex): 2 Total score (0-10 with 10 being normal): 9 IMPRESSION: 1. Suspicious hyperdensity at the left ICA terminus and suspect subtle loss of gray-white differentiation in the posterior left MCA territory. No acute hemorrhage or intracranial mass effect. 2. ASPECTS is 9. 3. Chronic posterior right ACA infarct. 4. Study discussed by telephone with Dr. Aram Beecham MESNER on 08/06/2016 at 0823 hours Electronically Signed   By: Odessa Fleming M.D.   On: 08/06/2016 08:29    EKG: (Independently reviewed) sinus rhythm with  underlying first-degree AV block and PACs, ventricular rate 70 bpm, QTC 494 ms, underlying right bundle branch block, no ischemic changes  Assessment/Plan Principal Problem:   Acute ischemic left MCA stroke -Presents with right facial droop and dysarthria that has been waxing and waning with possible stroke as documented above -Neurology following -Ischemic stroke order set initiated -Neurologist recommends stat MRI/MRA brain-no eliquis until imaging completed -Echocardiogram -Underwent CTA head and neck so no indication for carotid duplex -Aspirin for antiplatelet -Lipid panel/hemoglobin A1c for risk stratification -PT/OT/SLP evaluation -NPO until passes aren't stroke swallow evaluation -At baseline for the most part utilized a wheelchair for mobilization and has caregivers in the home so anticipate discharge disposition will be to return to home  Active Problems:   H/O TIA (transient ischemic attack) and embolic stroke/Left hemiparesis  -See above -Immediate post stroke in 2016 patient had issues with significant left lower extremity spasticity which began recurring 2 days ago primarily at night -Continue preadmission medications: Neurontin, baclofen, Lidoderm patch to left thigh    Acute urinary retention -Voided 300 mL but still complaining of need to void -I/O catheterization performed with an additional 950 mL obtained -Family member at bedside reports patient had similar issues during previous stroke July 2016 -We will need to follow urine output closely and if requires additional I/O catheterizations may require placement of Foley catheter especially if significant volume as above    HTN (hypertension) -Moderate control -Allow for a degree of permissive hypertension -Continue preadmission Lopressor -On the Flomax for issues related to intermittent urinary retention -Hydralazine IV prn SBP >220 or DBP > 120    Hypothyroidism -Continue Synthroid    Atrial fibrillation    -Currently maintaining sinus rhythm with first-degree AV block and PACs -Continue eliquis if no significant issues on MRI -Continue beta blocker -CHADVASc=6    Hyperlipidemia LDL goal <70 -Not on statin prior to admission (reported allergy)    Spinal stenosis, lumbar region, with neurogenic claudication -Continue preadmission Neurontin, baclofen and Norco     GERD (gastroesophageal reflux disease) -Continue PPI    Insomnia -Was on prn Elavil at home      DVT prophylaxis: Eliquis-hold until after MRI completed Code Status: Full Family Communication: Family member at bedside Disposition Plan: Anticipate discharge back to home environment pending PT/OT evaluation Consults called: Neurology/Kirkpatrick     Lilyauna Miedema L. ANP-BC [facility] Pager [phone]   If 7PM-7AM, please contact night-coverage [email or web address] Password [facility]  08/06/2016, 9:49 AM

## 2016-08-06 NOTE — ED Notes (Addendum)
Sherita and I cleaned up Pt. Pt family stated that she had went to the bedroom while she was in MRI. Family also said that she told them that she had to use the bathroom while she was at MRI.

## 2016-08-07 ENCOUNTER — Inpatient Hospital Stay (HOSPITAL_COMMUNITY): Payer: Medicare Other

## 2016-08-07 DIAGNOSIS — I6789 Other cerebrovascular disease: Secondary | ICD-10-CM

## 2016-08-07 LAB — ECHOCARDIOGRAM COMPLETE
HEIGHTINCHES: 64 in
WEIGHTICAEL: 2268.8 [oz_av]

## 2016-08-07 LAB — LIPID PANEL
Cholesterol: 180 mg/dL (ref 0–200)
HDL: 54 mg/dL (ref >40)
LDL Cholesterol: 118 mg/dL — ABNORMAL HIGH (ref 0–99)
TRIGLYCERIDES: 40 mg/dL (ref <150)
Total CHOL/HDL Ratio: 3.3 RATIO
VLDL: 8 mg/dL (ref 0–40)

## 2016-08-07 MED ORDER — ASPIRIN EC 81 MG PO TBEC
81.0000 mg | DELAYED_RELEASE_TABLET | Freq: Every day | ORAL | Status: DC
  Administered 2016-08-09: 81 mg via ORAL
  Filled 2016-08-07 (×2): qty 1

## 2016-08-07 MED ORDER — PERFLUTREN LIPID MICROSPHERE
1.0000 mL | INTRAVENOUS | Status: AC | PRN
  Administered 2016-08-07: 2 mL via INTRAVENOUS
  Filled 2016-08-07: qty 10

## 2016-08-07 MED ORDER — PRAVASTATIN SODIUM 20 MG PO TABS: 20.0000 mg | ORAL_TABLET | Freq: Every day | ORAL | Status: DC

## 2016-08-07 NOTE — NC FL2 (Signed)
[place] MEDICAID FL2 LEVEL OF CARE SCREENING TOOL     IDENTIFICATION  Patient Name: Angela PavlovRachel E Trier Birthdate: 09-04-1929 Sex: female Admission Date (Current Location): 08/06/2016  [facility] [place]   Facility and Address:  [facility], [address], [place], [place] [address]      Provider Number: [id number]  Attending Physician Name and Address:  Jerald KiefStephen K Chiu, MD  Relative Name and Phone Number:       Current Level of Care: Hospital Recommended Level of Care: Skilled Nursing Facility Prior Approval Number:    Date Approved/Denied:   PASRR Number: [id number]  Discharge Plan: SNF    Current Diagnoses: Patient Active Problem List   Diagnosis Date Noted  . Acute ischemic left MCA stroke (HCC) 08/06/2016  . Acute urinary retention 08/06/2016  . Aphasia   . Depression 02/03/2016  . Insomnia 02/03/2016  . Chronic musculoskeletal pain 02/03/2016  . GERD (gastroesophageal reflux disease) 12/31/2015  . Pressure ulcer 12/23/2015  . Atrial fibrillation (HCC) 06/06/2015  . Embolic stroke (HCC) 03/22/2015  . Left hemiparesis (HCC)   . Cryptogenic stroke (HCC)   . H/O TIA (transient ischemic attack) and stroke   . Spinal stenosis, lumbar region, with neurogenic claudication 01/17/2015  . Lumbar stenosis 01/17/2015  . 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 Essential hypertension 11/10/2014  . Chronic back pain 11/08/2014  . Cerebral infarction due to thrombosis of cerebral artery (HCC)   . Hyperlipidemia LDL goal <70 09/07/2014  . Presumed CVA (cerebral infarction) s/p IV tPA, MRI negative 09/05/2014  . Hypertensive urgency 09/05/2014  . Right bundle branch block 03/21/2014  . Cough 10/31/2012  . Elevated lipase 10/30/2012  . HTN (hypertension) 10/30/2012  . Hypothyroidism 10/30/2012    Orientation RESPIRATION BLADDER Height & Weight     Self, Situation, Place  Normal Continent Weight: 141 lb 12.8 oz  (64.3 kg) Height:  5\' 4"  (162.6 cm)  BEHAVIORAL SYMPTOMS/MOOD NEUROLOGICAL BOWEL NUTRITION STATUS      Continent Diet (thin consistency)  AMBULATORY STATUS COMMUNICATION OF NEEDS Skin   Extensive Assist Verbally Normal                       Personal Care Assistance Level of Assistance  Bathing, Feeding, Dressing Bathing Assistance: Maximum assistance Feeding assistance: Limited assistance Dressing Assistance: Maximum assistance     Functional Limitations Info  Sight, Hearing, Speech Sight Info: Adequate Hearing Info: Adequate Speech Info: Adequate    SPECIAL CARE FACTORS FREQUENCY  PT (By licensed PT), OT (By licensed OT), Speech therapy     PT Frequency: 5x OT Frequency: 5x     Speech Therapy Frequency: 5x      Contractures Contractures Info: Not present    Additional Factors Info  Code Status, Allergies Code Status Info: Full Allergies Info: Ace Inhibitors, Lipitor Atorvastatin, Motrin Ibuprofen           Current Medications (08/07/2016):  This is the current hospital active medication list Current Facility-Administered Medications  Medication Dose Route Frequency Provider Last Rate Last Dose  . acetaminophen (TYLENOL) tablet 650 mg  650 mg Oral Q4H PRN Russella DarAllison L Ellis, NP       Or  . acetaminophen (TYLENOL) solution 650 mg  650 mg Per Tube Q4H PRN Russella DarAllison L Ellis, NP       Or  . acetaminophen (TYLENOL) suppository 650 mg  650 mg Rectal Q4H PRN Russella DarAllison L Ellis, NP      .  apixaban (ELIQUIS) tablet 5 mg  5 mg Oral BID Russella Dar, NP   5 mg at 08/07/16 1238  . aspirin suppository 300 mg  300 mg Rectal Daily Russella Dar, NP   300 mg at 08/06/16 2230   Or  . aspirin tablet 325 mg  325 mg Oral Daily Russella Dar, NP   325 mg at 08/07/16 1237  . baclofen (LIORESAL) tablet 10 mg  10 mg Oral TID Russella Dar, NP   10 mg at 08/07/16 1718  . baclofen (LIORESAL) tablet 20 mg  20 mg Oral QHS Russella Dar, NP      . dorzolamide-timolol (COSOPT)  22.3-6.8 MG/ML ophthalmic solution 1 drop  1 drop Both Eyes BID Russella Dar, NP   1 drop at 08/07/16 1239  . gabapentin (NEURONTIN) capsule 100 mg  100 mg Oral BID WC Ozella Rocks, MD   100 mg at 08/07/16 1238  . gabapentin (NEURONTIN) capsule 600 mg  600 mg Oral QHS Ozella Rocks, MD      . hydrALAZINE (APRESOLINE) injection 10 mg  10 mg Intravenous Q4H PRN Russella Dar, NP      . HYDROcodone-acetaminophen (NORCO/VICODIN) 5-325 MG per tablet 1 tablet  1 tablet Oral BID PRN Russella Dar, NP      . latanoprost (XALATAN) 0.005 % ophthalmic solution 1 drop  1 drop Both Eyes QHS Russella Dar, NP   1 drop at 08/07/16 0028  . [START ON 08/08/2016] levothyroxine (SYNTHROID, LEVOTHROID) tablet 88 mcg  88 mcg Oral QAC breakfast Ozella Rocks, MD      . lidocaine (LIDODERM) 5 % 1 patch  1 patch Transdermal Q24H Russella Dar, NP   1 patch at 08/07/16 0037  . metoprolol (LOPRESSOR) injection 2.5 mg  2.5 mg Intravenous Q6H Leda Gauze, NP   2.5 mg at 08/07/16 1719  . metoprolol tartrate (LOPRESSOR) tablet 25 mg  25 mg Oral BID Russella Dar, NP   25 mg at 08/07/16 1238  . mirtazapine (REMERON) tablet 15 mg  15 mg Oral QHS Russella Dar, NP      . pantoprazole (PROTONIX) EC tablet 40 mg  40 mg Oral BID Russella Dar, NP   40 mg at 08/07/16 1238  . tamsulosin (FLOMAX) capsule 0.4 mg  0.4 mg Oral Daily Russella Dar, NP   0.4 mg at 08/07/16 1237     Discharge Medications: Please see discharge summary for a list of discharge medications.  Relevant Imaging Results:  Relevant Lab Results:   Additional Information SSN: [id number]  Dominic Pea, LCSW

## 2016-08-07 NOTE — Consult Note (Signed)
[facility] CM Primary Care Navigator  08/07/2016  Genese Quebedeaux [facility] Mar 23, 1930 [id number]  Met with patient, son Rosario Jacks), daughter in-law Coralyn Helling) and granddaughter Colletta [place]) at the bedside to identify possible discharge needs. Patient has limited speech related to stroke according to family. Family reported that patient slid down to the toilet floor and unable to get up; had facial drooping while EMS were evaluating her which led to this admission.  Patient endorses Dr. Josetta Huddle with [facility] as the primary care provider.    Patient shared using [facility] in [place] to obtain medications without any problem.  Patient's daughter in-law manages her medications at home using "pill box" system weekly.   Daughter in-law (retired Marine scientist) provides transportation to her doctors' appointments.  Patient had been very much independent and living alone with son and family living next door. Daughter in-law will be the primary caregiver at home. Family will be able to provide assistance with care as needed. Daughter in-law mentioned having personal caregivers she can contact if needed.  Anticipated discharge plan is possibly skilled nursing facility for rehabilitation before returning home, per family.  Patient voiced understanding to call primary care provider's office once she returns back home, for a post discharge follow-up appointment within a week or sooner if needs arise.  Patient letter (with PCP's contact number) was provided as a reminder.  No further needs or concerns voiced at this time.  For additional questions please contact:  Edwena Felty A. Kahmari Herard, BSN, RN-BC [facility] PRIMARY CARE Navigator Cell: [phone]

## 2016-08-07 NOTE — Care Management Note (Signed)
Case Management Note  Patient Details  Name: Angela Keith MRN: [id number] Date of Birth: 1929/10/27  Subjective/Objective:              Patient was admitted with CVA. Lives at home alone, but has caregivers.  At baseline, patient is wheelchair bound/2+ max assist with a rolling walker. CM will follow for discharge needs pending PT/OT evals and physician orders.       Action/Plan:   Expected Discharge Date:                  Expected Discharge Plan:     In-House Referral:     Discharge planning Services     Post Acute Care Choice:    Choice offered to:     DME Arranged:    DME Agency:     HH Arranged:    HH Agency:     Status of Service:     If discussed at [employer] Length of Stay Meetings, dates discussed:    Additional Comments:  Anda KraftRobarge, Correy Weidner C, RN 08/07/2016, 10:04 AM

## 2016-08-07 NOTE — [place] FL2 (Signed)
[place] MEDICAID FL2 LEVEL OF CARE SCREENING TOOL     IDENTIFICATION  Patient Name: Angela PavlovRachel E Grossberg Birthdate: 08/25/1929 Sex: female Admission Date (Current Location): 08/06/2016  [facility] [place]   Facility and Address:  [facility] [place], [address], [place], [place] [address]      Provider Number: [id number]  Attending Physician Name and Address:  Jerald KiefStephen K Chiu, MD  Relative Name and Phone Number:       Current Level of Care: Hospital Recommended Level of Care: Skilled Nursing Facility Prior Approval Number:    Date Approved/Denied:   PASRR Number: [id number]  Discharge Plan: SNF    Current Diagnoses: Patient Active Problem List   Diagnosis Date Noted  . Acute ischemic left MCA stroke (HCC) 08/06/2016  . Acute urinary retention 08/06/2016  . Aphasia   . Depression 02/03/2016  . Insomnia 02/03/2016  . Chronic musculoskeletal pain 02/03/2016  . GERD (gastroesophageal reflux disease) 12/31/2015  . Pressure ulcer 12/23/2015  . Atrial fibrillation (HCC) 06/06/2015  . Embolic stroke (HCC) 03/22/2015  . Left hemiparesis (HCC)   . Cryptogenic stroke (HCC)   . H/O TIA (transient ischemic attack) and stroke   . Spinal stenosis, lumbar region, with neurogenic claudication 01/17/2015  . Lumbar stenosis 01/17/2015  . 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 Essential hypertension 11/10/2014  . Chronic back pain 11/08/2014  . Cerebral infarction due to thrombosis of cerebral artery (HCC)   . Hyperlipidemia LDL goal <70 09/07/2014  . Presumed CVA (cerebral infarction) s/p IV tPA, MRI negative 09/05/2014  . Hypertensive urgency 09/05/2014  . Right bundle branch block 03/21/2014  . Cough 10/31/2012  . Elevated lipase 10/30/2012  . HTN (hypertension) 10/30/2012  . Hypothyroidism 10/30/2012    Orientation RESPIRATION BLADDER Height & Weight     Self, Situation, Place  Normal Continent Weight: 141 lb 12.8 oz  (64.3 kg) Height:  5\' 4"  (162.6 cm)  BEHAVIORAL SYMPTOMS/MOOD NEUROLOGICAL BOWEL NUTRITION STATUS      Continent Diet (thin consistency)  AMBULATORY STATUS COMMUNICATION OF NEEDS Skin   Extensive Assist Verbally Normal                       Personal Care Assistance Level of Assistance  Bathing, Feeding, Dressing Bathing Assistance: Maximum assistance Feeding assistance: Limited assistance Dressing Assistance: Maximum assistance     Functional Limitations Info  Sight, Hearing, Speech Sight Info: Adequate Hearing Info: Adequate Speech Info: Adequate    SPECIAL CARE FACTORS FREQUENCY  PT (By licensed PT), OT (By licensed OT), Speech therapy     PT Frequency: 5x OT Frequency: 5x     Speech Therapy Frequency: 5x      Contractures Contractures Info: Not present    Additional Factors Info  Code Status, Allergies Code Status Info: Full Allergies Info: Ace Inhibitors, Lipitor Atorvastatin, Motrin Ibuprofen           Current Medications (08/07/2016):  This is the current hospital active medication list Current Facility-Administered Medications  Medication Dose Route Frequency Provider Last Rate Last Dose  . acetaminophen (TYLENOL) tablet 650 mg  650 mg Oral Q4H PRN Russella DarAllison L Ellis, NP       Or  . acetaminophen (TYLENOL) solution 650 mg  650 mg Per Tube Q4H PRN Russella DarAllison L Ellis, NP       Or  . acetaminophen (TYLENOL) suppository 650 mg  650 mg Rectal Q4H PRN Russella DarAllison L Ellis, NP      .  apixaban (ELIQUIS) tablet 5 mg  5 mg Oral BID Russella Dar, NP   5 mg at 08/07/16 1238  . aspirin suppository 300 mg  300 mg Rectal Daily Russella Dar, NP   300 mg at 08/06/16 2230   Or  . aspirin tablet 325 mg  325 mg Oral Daily Russella Dar, NP   325 mg at 08/07/16 1237  . baclofen (LIORESAL) tablet 10 mg  10 mg Oral TID Russella Dar, NP   10 mg at 08/07/16 1238  . baclofen (LIORESAL) tablet 20 mg  20 mg Oral QHS Russella Dar, NP      . dorzolamide-timolol (COSOPT)  22.3-6.8 MG/ML ophthalmic solution 1 drop  1 drop Both Eyes BID Russella Dar, NP   1 drop at 08/07/16 1239  . gabapentin (NEURONTIN) capsule 100 mg  100 mg Oral BID WC Ozella Rocks, MD   100 mg at 08/07/16 1238  . gabapentin (NEURONTIN) capsule 600 mg  600 mg Oral QHS Ozella Rocks, MD      . hydrALAZINE (APRESOLINE) injection 10 mg  10 mg Intravenous Q4H PRN Russella Dar, NP      . HYDROcodone-acetaminophen (NORCO/VICODIN) 5-325 MG per tablet 1 tablet  1 tablet Oral BID PRN Russella Dar, NP      . latanoprost (XALATAN) 0.005 % ophthalmic solution 1 drop  1 drop Both Eyes QHS Russella Dar, NP   1 drop at 08/07/16 0028  . [START ON 08/08/2016] levothyroxine (SYNTHROID, LEVOTHROID) tablet 88 mcg  88 mcg Oral QAC breakfast Ozella Rocks, MD      . lidocaine (LIDODERM) 5 % 1 patch  1 patch Transdermal Q24H Russella Dar, NP   1 patch at 08/07/16 0037  . metoprolol (LOPRESSOR) injection 2.5 mg  2.5 mg Intravenous Q6H Leda Gauze, NP   2.5 mg at 08/07/16 1238  . metoprolol tartrate (LOPRESSOR) tablet 25 mg  25 mg Oral BID Russella Dar, NP   25 mg at 08/07/16 1238  . mirtazapine (REMERON) tablet 15 mg  15 mg Oral QHS Russella Dar, NP      . pantoprazole (PROTONIX) EC tablet 40 mg  40 mg Oral BID Russella Dar, NP   40 mg at 08/07/16 1238  . perflutren lipid microspheres (DEFINITY) IV suspension  1-10 mL Intravenous PRN Russella Dar, NP   2 mL at 08/07/16 1544  . tamsulosin (FLOMAX) capsule 0.4 mg  0.4 mg Oral Daily Russella Dar, NP   0.4 mg at 08/07/16 1237     Discharge Medications: Please see discharge summary for a list of discharge medications.  Relevant Imaging Results:  Relevant Lab Results:   Additional Information SSN: [id number]  Dominic Pea, LCSW

## 2016-08-07 NOTE — Evaluation (Signed)
Physical Therapy Evaluation Patient Details Name: Angela Keith MRN: [id number] DOB: 01-11-1930 Today's Date: 08/07/2016   History of Present Illness  Patient is an 83 y/o female with h/o HTN, HLD, hypothyroid, CVA w/ L weakness admitted with right facial drooping, dysarthria, known previous CVA with left-sided deficits. Imaging consistent with acute left MCA CVA.  Clinical Impression  Patient presents with decreased independence with mobility.  Currently requiring mod A for mobility, but at home performed transfers mod I to w/c and toileted, showered herself with intermittent assist at home.  Currently limited due to decreased initiation, poor safety awareness, decreased balance and general weakness.  Feel she will benefit from skilled PT in the acute setting to allow return home following STSNF rehab.     Follow Up Recommendations SNF;Supervision/Assistance - 24 hour    Equipment Recommendations  None recommended by PT    Recommendations for Other Services       Precautions / Restrictions Precautions Precautions: Fall Precaution Comments: L LE flexor withdrawl      Mobility  Bed Mobility Overal bed mobility: Needs Assistance Bed Mobility: Rolling;Sidelying to Sit Rolling: Mod assist Sidelying to sit: Max assist       General bed mobility comments: assist to roll to get off bedpan; assis for legs off bed and to lift trunk upright  Transfers Overall transfer level: Needs assistance   Transfers: Stand Pivot Transfers;Squat Pivot Transfers   Stand pivot transfers: Mod assist Squat pivot transfers: Mod assist     General transfer comment: to [facility] with cues to reach for opposite armrest on R side, assist for pivoting hips and lowering to toilet; stood from toilet with mod support and stood for hygiene, pivot to recliner on R cues to reach for armrest, assist to pivot on R foot (holds L foot up in flexor withdrawl)  Ambulation/Gait             General Gait Details:  unable  Stairs            Wheelchair Mobility    Modified Rankin (Stroke Patients Only) Modified Rankin (Stroke Patients Only) Pre-Morbid Rankin Score: Moderate disability Modified Rankin: Severe disability     Balance Overall balance assessment: Needs assistance Sitting-balance support: Single extremity supported Sitting balance-Leahy Scale: Poor Sitting balance - Comments: leaning posteriorly initially mod support to correct, with cues able to sit with close S for few minutes Postural control: Posterior lean Standing balance support: Single extremity supported;During functional activity Standing balance-Leahy Scale: Poor Standing balance comment: min/mod support for standing balance with L UE support on bedrail, keeping L leg off floor, assist for hygiene after toileting                             Pertinent Vitals/Pain Pain Assessment: No/denies pain    Home Living Family/patient expects to be discharged to:: Skilled nursing facility Living Arrangements: Alone Available Help at Discharge: Available PRN/intermittently;Family;Personal care attendant Type of Home: House Home Access: Ramped entrance     Home Layout: Two level;Able to live on main level with bedroom/bathroom Home Equipment: Wheelchair - manual;Bedside commode;Tub bench;Grab bars - tub/shower;Hand held shower head Additional Comments: life alert    Prior Function Level of Independence: Independent with assistive device(s)         Comments: daughter lives next door, had hired caregiver about 3 hours a day until caregiver got sick; pt showers indep, has family to check in about every 2-3 hours  Hand Dominance   Dominant Hand: Right    Extremity/Trunk Assessment   Upper Extremity Assessment Upper Extremity Assessment: RUE deficits/detail;[name] deficits/detail RUE Deficits / Details: AROM and strength WFL [name] Deficits / Details: inattention and some difficulty with releasing grip at  times    Lower Extremity Assessment Lower Extremity Assessment: RLE deficits/detail;LLE deficits/detail RLE Deficits / Details: AROM/strength grossly WFL LLE Deficits / Details: increased tone in flexion (close to 4 on Mod [place]); able to extend in supine, but tight in knee flexors and ankle PF       Communication   Communication: HOH;Expressive difficulties (dyarthria, aphasia)  Cognition Arousal/Alertness: Awake/alert Behavior During Therapy: Flat affect Overall Cognitive Status: Difficult to assess Area of Impairment: Following commands;Problem solving       Following Commands: Follows one step commands with increased time     Problem Solving: Slow processing;Decreased initiation;Requires verbal cues      General Comments      Exercises     Assessment/Plan    PT Assessment Patient needs continued PT services  PT Problem List Decreased strength;Decreased mobility;Decreased safety awareness;Decreased cognition;Decreased balance;Decreased knowledge of use of DME;Decreased range of motion          PT Treatment Interventions DME instruction;Therapeutic activities;Cognitive remediation;Therapeutic exercise;Patient/family education;Functional mobility training;Balance training;Wheelchair mobility training;Neuromuscular re-education    PT Goals (Current goals can be found in the Care Plan section)  Acute Rehab PT Goals Patient Stated Goal: To go to rehab and eventually return home PT Goal Formulation: With family Time For Goal Achievement: 08/21/16 Potential to Achieve Goals: Fair    Frequency Min 4X/week   Barriers to discharge        Co-evaluation               End of Session Equipment Utilized During Treatment: Gait belt Activity Tolerance: Patient tolerated treatment well Patient left: in chair;with chair alarm set;with family/visitor present Nurse Communication: Mobility status         Time: [id number] PT Time Calculation (min) (ACUTE ONLY):  32 min   Charges:   PT Evaluation $PT Eval Moderate Complexity: 1 Procedure PT Treatments $Therapeutic Activity: 8-22 mins   PT G CodesElray Mcgregor:        Cynthia Wynn 08/07/2016, 10:50 AM  Sheran Lawlessyndi Wynn, PT [phone] 08/07/2016

## 2016-08-07 NOTE — Evaluation (Signed)
Clinical/Bedside Swallow Evaluation Patient Details  Name: Angela Keith MRN: [id number] Date of Birth: 10-26-1929  Today's Date: 08/07/2016 Time: SLP Start Time (ACUTE ONLY): 1202 SLP Stop Time (ACUTE ONLY): 1220 SLP Time Calculation (min) (ACUTE ONLY): 18 min  Past Medical History:  Past Medical History:  Diagnosis Date  . Arthritis   . Atrial fibrillation (HCC)   . Chronic neck pain   . DJD (degenerative joint disease), cervical   . DJD (degenerative joint disease), lumbar   . Dysphagia   . GERD (gastroesophageal reflux disease)   . Glaucoma   . HTN (hypertension) 10/30/2012  . Hyperlipidemia   . Hypothyroidism   . Insomnia   . Nocturia   . Right bundle branch block 03/21/2014  . Rotoscoliosis   . Severe hearing loss   . Shortness of breath dyspnea   . Stroke (HCC)   . Unspecified hypothyroidism 10/30/2012   Past Surgical History:  Past Surgical History:  Procedure Laterality Date  . BACK SURGERY     lower back   . bladder laser surgery    . BREAST SURGERY    . cataract surgery    . EP IMPLANTABLE DEVICE N/A 01/19/2015   Procedure: Loop Recorder Insertion;  Surgeon: Marinus MawGregg W Taylor, MD;  [facility];  Service: Cardiovascular;  Laterality: N/A;  . ESOPHAGOGASTRODUODENOSCOPY (EGD) WITH ESOPHAGEAL DILATION  06/08/2012   Procedure: ESOPHAGOGASTRODUODENOSCOPY (EGD) WITH ESOPHAGEAL DILATION;  Surgeon: Charolett BumpersMartin K Johnson, MD;  Location: [facility] ENDOSCOPY;  Service: Endoscopy;  Laterality: N/A;  . facail surgery  2000   facelift and eyelid lift  . left carpal tunnel surgery    . LUMBAR LAMINECTOMY/DECOMPRESSION MICRODISCECTOMY N/A 01/17/2015   Procedure: Laminectomy and Foraminotomy - L2-L3 - L3-L4;  Surgeon: Julio SicksHenry Pool, MD;  Location: [facility] NEURO ORS;  Service: Neurosurgery;  Laterality: N/A;  Laminectomy and Foraminotomy - L2-L3 - L3-L4  . TEE WITHOUT CARDIOVERSION N/A 01/19/2015   Procedure: TRANSESOPHAGEAL ECHOCARDIOGRAM (TEE);  Surgeon: Chrystie NoseKenneth C Hilty, MD;  [facility];  Service: Cardiovascular;  Laterality: N/A;   HPI:  Patient is an 83 y/o female with h/o HTN, HLD, hypothyroid, CVA w/ L weakness admitted with right facial drooping, dysarthria, known previous CVA with left-sided deficits. Imaging consistent with acute left MCA CVA.   Assessment / Plan / Recommendation Clinical Impression  Pt has prolonged mastication and moderate right-sided buccal pocketing with regular textures, without pt awareness. This is reduced with SLP intervention for manipulation of foods to make it softer and in smaller pieces. SLP also provided liquid wash to clear more oral residuals. No overt s/s of aspiration were observed. Recommend [name] diet and thin liquids with full supervision to facilitate oral clearance.     Aspiration Risk  Mild aspiration risk    Diet Recommendation Dysphagia 2 (Fine chop);Thin liquid   Liquid Administration via: Cup;Straw Medication Administration: Whole meds with puree Supervision: Patient able to self feed;Full supervision/cueing for compensatory strategies Compensations: Slow rate;Small sips/bites;Follow solids with liquid;Lingual sweep for clearance of pocketing Postural Changes: Seated upright at 90 degrees    Other  Recommendations Oral Care Recommendations: Oral care BID   Follow up Recommendations Skilled Nursing facility      Frequency and Duration min 2x/week  2 weeks       Prognosis Prognosis for Safe Diet Advancement: Good Barriers to Reach Goals: Language deficits      Swallow Study   General HPI: Patient is an 83 y/o female with h/o HTN, HLD, hypothyroid, CVA  w/ L weakness admitted with right facial drooping, dysarthria, known previous CVA with left-sided deficits. Imaging consistent with acute left MCA CVA. Type of Study: Bedside Swallow Evaluation Previous Swallow Assessment: BSE July 2016 recommending regular diet, thin liquids Diet Prior to this Study: NPO Temperature Spikes Noted: No Respiratory  Status: Nasal cannula History of Recent Intubation: No Behavior/Cognition: Alert;Cooperative;Pleasant mood;Requires cueing Oral Cavity Assessment: Within Functional Limits Oral Care Completed by SLP: No Oral Cavity - Dentition: Adequate natural dentition Vision: Functional for self-feeding Self-Feeding Abilities: Able to feed self;Needs assist Patient Positioning: Upright in chair Baseline Vocal Quality: Low vocal intensity    Oral/Motor/Sensory Function Overall Oral Motor/Sensory Function: Mild impairment Facial ROM: Reduced right;Suspected CN VII (facial) dysfunction Facial Symmetry: Abnormal symmetry right;Suspected CN VII (facial) dysfunction Facial Strength: Reduced right;Suspected CN VII (facial) dysfunction Lingual ROM: Reduced right;Suspected CN XII (hypoglossal) dysfunction Lingual Symmetry: Abnormal symmetry right;Suspected CN XII (hypoglossal) dysfunction Lingual Strength: Reduced;Suspected CN XII (hypoglossal) dysfunction Lingual Sensation: Reduced;Suspected CN VII (facial) dysfunction-anterior 2/3 tongue   Ice Chips Ice chips: Not tested   Thin Liquid Thin Liquid: Within functional limits Presentation: Cup;Self Fed;Straw    Nectar Thick Nectar Thick Liquid: Not tested   Honey Thick Honey Thick Liquid: Not tested   Puree Puree: Within functional limits Presentation: Self Fed;Spoon   Solid   GO   Solid: Impaired Presentation: Self Fed Oral Phase Impairments: Impaired mastication;Reduced lingual movement/coordination Oral Phase Functional Implications: Right lateral sulci pocketing        Maxcine Ham 08/07/2016,3:20 PM  Maxcine Ham, M.A. CCC-SLP [phone]

## 2016-08-07 NOTE — Progress Notes (Addendum)
PROGRESS NOTE    Angela Keith  ZOX:[id number] DOB: 07-Aug-1929 DOA: 08/06/2016 PCP: [name], MD    Brief Narrative:  83 y.o. 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 Last seen normal around 5:30 yesterday evening. Caregiver at the home noted this morning patient has had 2-3 episodes of dysarthria associated right facial drooping. In addition, patient was attempting to use the restroom and upon standing to try to pull up her undergarments she slid to the floor. She denied dizziness. She has had recent issues with worsening spasticity of the left lower extremity over the past several days. EMS was called to the home and by the time they arrived the symptoms had resolved. Upon patient to the ER symptoms had recurred. Code stroke was initiated. Neurology has seen the patient and deemed her not appropriate for TPA based on severity of symptoms and concurrent utilization of eliquis anticoagulation. Imaging consistent with likely left MCA territory CVA  Assessment & Plan:   Principal Problem:   Acute ischemic left MCA stroke (HCC) Active Problems:   HTN (hypertension)   Hypothyroidism   Hyperlipidemia LDL goal <70   Spinal stenosis, lumbar region, with neurogenic claudication   H/O TIA (transient ischemic attack) and stroke   Left hemiparesis (HCC)   Atrial fibrillation (HCC)   GERD (gastroesophageal reflux disease)   Insomnia   Acute urinary retention   Acute ischemic left MCA stroke -Presents with right facial droop and dysarthria that has been waxing and waning -Neurology following -Pt is now s/p MRI brain with findings of small acute/early subacute infarct within the L posterior limb of the internal capsule -Echocardiogram  done, pending results -Underwent CTA head and neck so no indication for carotid duplex -Continue aspirin for antiplatelet -Lipid panel/hemoglobin A1c for risk stratification -PT/OT/SLP consulted -SLP recs for dysphagia 2 with thin liquids  H/O TIA (transient ischemic attack) and embolic stroke/Left hemiparesis  -See above -Immediate post stroke in 2016 patient had issues with significant left lower extremity spasticity which began recurring 2 days ago primarily at night -For now, will continue with preadmission medications: Neurontin, baclofen, Lidoderm patch to left thigh    Acute urinary retention -Multiple episodes overnight of bladder outlet obstruction -Family member at bedside reports patient had similar issues during previous stroke July 2016 -Have ordered renal US to r/o obstruction -If >300cc on bladder scan, then would place indwelling foley cath and have pt f/u with Urology as outpatient    HTN (hypertension) -Moderate control -Allowing for permissive hypertension -For now, will continue preadmission Lopressor -On the Flomax for issues related to intermittent urinary retention -Hydralazine IV prn ordered for SBP >220 or DBP > 120    Hypothyroidism -Continue Synthroid -Stable at present    Atrial fibrillation  -Currently maintaining sinus rhythm with first-degree AV block and PACs -Continue beta blocker -CHADVASc=6 -On eliquis    Hyperlipidemia LDL goal <70 -Not on statin prior to admission (reported allergy) -Appears to be stable at present    Spinal stenosis, lumbar region, with neurogenic claudication -Continue preadmission Neurontin, baclofen and Norco -Stable currently    GERD (gastroesophageal reflux disease) -Continue PPI -Stable    Insomnia -Was on prn Elavil at home -Appears stable  DVT prophylaxis: eliquis Code Status: Full Family Communication: Pt in room, granddaughter at bedside Disposition Plan: Uncertain at this  time  Consultants:   Neurology  Procedures:     Antimicrobials: Anti-infectives    None       Subjective: No complaints at present  Objective: Vitals:   08/07/16 0200 08/07/16 0400 08/07/16 0600 08/07/16 1415  BP: 139/72 140/90 (!) 157/92 (!) 163/82  Pulse: 77 85 90 80  Resp: 18 18 18 17   Temp:    98.4 F (36.9 C)  TempSrc:    Oral  SpO2: 100% 99% 97% 99%  Weight:      Height:        Intake/Output Summary (Last 24 hours) at 08/07/16 1558 Last data filed at 08/07/16 0836  Gross per 24 hour  Intake            227.5 ml  Output             1500 ml  Net          -1272.5 ml   Filed Weights   08/06/16 2032  Weight: 64.3 kg (141 lb 12.8 oz)    Examination:  General exam: Appears calm and comfortable  Respiratory system: Clear to auscultation. Respiratory effort normal. Cardiovascular system: S1 & S2 heard, RRR. Gastrointestinal system: Abdomen is nondistended, soft and nontender. No organomegaly or masses felt. Normal bowel sounds heard. Central nervous system: Alert and oriented. R sided facial droop, slurred speech Extremities: Symmetric 5 x 5 power. Skin: No rashes, lesions Psychiatry: Judgement and insight appear normal. Mood & affect appropriate.   Data Reviewed: I have personally reviewed following labs and imaging studies  CBC:  Recent Labs Lab 08/06/16 0759 08/06/16 0812  WBC 6.6  --   NEUTROABS 3.3  --   HGB 12.8 14.3  HCT 40.3 42.0  MCV 90.0  --   PLT 246  --    Basic Metabolic Panel:  Recent Labs Lab 08/06/16 0759 08/06/16 0812  NA 142 145  K 4.0 3.9  CL 107 103  CO2 31  --   GLUCOSE 105* 103*  BUN 15 19  CREATININE 0.93 1.00  CALCIUM 9.7  --    GFR: Estimated Creatinine Clearance (by C-G formula based on SCr of 1 mg/dL) Female: 16.1 mL/min Female: 46 mL/min Liver Function Tests:  Recent Labs Lab 08/06/16 0759  AST 22  ALT 18  ALKPHOS 65  BILITOT 0.3  PROT 7.5  ALBUMIN 3.9   No results for input(s): LIPASE,  AMYLASE in the last 168 hours. No results for input(s): AMMONIA in the last 168 hours. Coagulation Profile:  Recent Labs Lab 08/06/16 0759  INR 0.95   Cardiac Enzymes: No results for input(s): CKTOTAL, CKMB, CKMBINDEX, TROPONINI in the last 168 hours. BNP (last 3 results) No results for input(s): PROBNP in the last 8760 hours. HbA1C: No results for input(s): HGBA1C in the last 72 hours. CBG: No results for input(s): GLUCAP in the last 168 hours. Lipid Profile:  Recent Labs  08/07/16 0715  CHOL 180  HDL 54  LDLCALC 118*  TRIG 40  CHOLHDL 3.3   Thyroid Function Tests: No results for input(s): TSH, T4TOTAL, FREET4, T3FREE, THYROIDAB in the last 72 hours. Anemia Panel: No results for input(s): VITAMINB12, FOLATE, FERRITIN, TIBC, IRON, RETICCTPCT in the last 72 hours. Sepsis Labs: No results for input(s): PROCALCITON, LATICACIDVEN in the last 168 hours.  No results found for this or any previous visit (from the past 240 hour(s)).   Radiology Studies: Ct Angio Head W Or Wo Contrast  Result Date: 08/06/2016 CLINICAL DATA:  83 year old female with right  facial droop and aphasia. Code stroke. Personal history of distal right ACA infarct and intracranial atherosclerosis. Initial encounter. EXAM: CT PERFUSION BRAIN CT ANGIOGRAPHY HEAD AND NECK TECHNIQUE: Multiphase CT imaging of the brain was performed following IV bolus contrast injection. Subsequent parametric perfusion maps were calculated using RAPID software. Multidetector CT imaging of the head and neck was performed using the standard protocol during bolus administration of intravenous contrast. Multiplanar CT image reconstructions and MIPs were obtained to evaluate the vascular anatomy. Carotid stenosis measurements (when applicable) are obtained utilizing NASCET criteria, using the distal internal carotid diameter as the denominator. CONTRAST:  100 mL Isovue 370 COMPARISON:  Head CT without contrast today 0815 hours. Brain MRI  and intracranial MRA 01/18/2015. FINDINGS: CT BRAIN PERFUSION FINDINGS CBF (<30%) Volume:  0 mL Perfusion (Tmax>6.0s) volume: 4 mL (likely artifactual) Mismatch Volume: Not applicable Infarction Location: Not applicable CTA NECK FINDINGS Skeleton: Advanced degenerative changes in the cervical spine. No acute osseous abnormality identified. Upper chest: No acute apical lung findings aside from mild atelectasis. No superior mediastinal lymphadenopathy. Other neck: Diminutive or absent thyroid. Negative larynx, pharynx, parapharyngeal spaces, retropharyngeal space, sublingual space, submandibular glands and parotid glands. No cervical lymphadenopathy. Aortic arch: 3 vessel arch configuration. Mild to moderate mostly distal arch calcified atherosclerosis. Right carotid system: No brachiocephalic artery stenosis. Tortuous right CCA origin and proximal right CCA with a kinked appearance but no other stenosis. Minimal circumferential soft plaque in the right CCA. At the right carotid bifurcation there is mostly calcified plaque without stenosis. Mildly tortuous otherwise negative cervical right ICA. Left carotid system: Minimal plaque at the left CCA origin without stenosis. Tortuous left CCA. No stenosis proximal to the bifurcation. Mild calcified plaque at the bifurcation without stenosis. Mildly tortuous cervical left ICA. Vertebral arteries: Calcified plaque and tortuosity at the proximal right subclavian artery origin. Normal right vertebral artery origin. Tortuous but otherwise negative cervical right vertebral artery. Calcified plaque in the proximal left subclavian artery without stenosis. Normal left vertebral artery origin. Tortuous left V1 segment. Tortuous left V2 and V3 segments with otherwise negative cervical left vertebral artery. CTA HEAD Posterior circulation: Both distal vertebral arteries taper in the V4 segments without focal stenosis. Both PICA origins are patent. Patent vertebrobasilar junction. No  basilar stenosis. Normal SCA and left PCA origins. Mild to moderate irregularity of the right PCA origin and proximal P1 segment with stenosis. Similar right P2 segment findings (series 507, image 19). Mild left PCA irregularity. Moderate distal left PCA (P3 segment irregularity and stenosis (series 506, image 33). Posterior communicating arteries are diminutive or absent. Anterior circulation: Both ICA siphons are patent. There is moderate left siphon calcified plaque without significant stenosis. Similar moderate calcified plaque throughout the right ICA siphon with no significant stenosis. Patent carotid termini, MCA and ACA origins. The left ACA and a small anterior communicating artery are within normal limits. There is mild to moderate right A1 segment irregularity, and the right A2 segment is occluded proximal to the pericallosal artery level. There is mild to moderate distal left ACA branch irregularity (series 506, image 30). Right MCA M1 segment is mildly irregular. There is up to moderate mid right M1 segment irregularity and stenosis as seen on series 505, image 18 which is stable from the 2016 MRA. The right MCA bifurcation is patent. There is moderate right M2 branch irregularity and stenosis (series 506, image 22). The left mid M1 segment demonstrates moderate irregularity and stenosis (series 505, image 20 and series 504, image 176) which  appears stable to the 2016 MRA. The left MCA bifurcation remains patent. There is only mild left M2 branch irregularity and stenosis. Venous sinuses: Patent. Anatomic variants: None. Review of the MIP images confirms the above findings IMPRESSION: 1. CT Perfusion is negative for evidence of core infarct and CTA is negative for emergent large vessel occlusion. A text page with preliminary findings of negative ELVO was sent to [facility] on 08/06/2016 at 0832 hours. 2. Generalized arterial tortuosity in the neck with mild atherosclerosis and no stenosis. 3.  More moderate to severe chronic intracranial atherosclerosis and stenosis with chronic occlusion of the distal right ACA. Significant proximal stenoses which appear stable to the 2016 intracranial MRA include - Moderate bilateral MCA M1 segment stenoses, and - Moderate Right P1 and P2 segments stenoses. 4. Up to moderate distal branch irregularity and stenosis in the left PCA, left ACA, and right greater than left MCA territories. Electronically Signed   By: Odessa Fleming M.D.   On: 08/06/2016 09:06   Ct Angio Neck W Or Wo Contrast  Result Date: 08/06/2016 CLINICAL DATA:  83 year old female with right facial droop and aphasia. Code stroke. Personal history of distal right ACA infarct and intracranial atherosclerosis. Initial encounter. EXAM: CT PERFUSION BRAIN CT ANGIOGRAPHY HEAD AND NECK TECHNIQUE: Multiphase CT imaging of the brain was performed following IV bolus contrast injection. Subsequent parametric perfusion maps were calculated using RAPID software. Multidetector CT imaging of the head and neck was performed using the standard protocol during bolus administration of intravenous contrast. Multiplanar CT image reconstructions and MIPs were obtained to evaluate the vascular anatomy. Carotid stenosis measurements (when applicable) are obtained utilizing NASCET criteria, using the distal internal carotid diameter as the denominator. CONTRAST:  100 mL Isovue 370 COMPARISON:  Head CT without contrast today 0815 hours. Brain MRI and intracranial MRA 01/18/2015. FINDINGS: CT BRAIN PERFUSION FINDINGS CBF (<30%) Volume:  0 mL Perfusion (Tmax>6.0s) volume: 4 mL (likely artifactual) Mismatch Volume: Not applicable Infarction Location: Not applicable CTA NECK FINDINGS Skeleton: Advanced degenerative changes in the cervical spine. No acute osseous abnormality identified. Upper chest: No acute apical lung findings aside from mild atelectasis. No superior mediastinal lymphadenopathy. Other neck: Diminutive or absent thyroid.  Negative larynx, pharynx, parapharyngeal spaces, retropharyngeal space, sublingual space, submandibular glands and parotid glands. No cervical lymphadenopathy. Aortic arch: 3 vessel arch configuration. Mild to moderate mostly distal arch calcified atherosclerosis. Right carotid system: No brachiocephalic artery stenosis. Tortuous right CCA origin and proximal right CCA with a kinked appearance but no other stenosis. Minimal circumferential soft plaque in the right CCA. At the right carotid bifurcation there is mostly calcified plaque without stenosis. Mildly tortuous otherwise negative cervical right ICA. Left carotid system: Minimal plaque at the left CCA origin without stenosis. Tortuous left CCA. No stenosis proximal to the bifurcation. Mild calcified plaque at the bifurcation without stenosis. Mildly tortuous cervical left ICA. Vertebral arteries: Calcified plaque and tortuosity at the proximal right subclavian artery origin. Normal right vertebral artery origin. Tortuous but otherwise negative cervical right vertebral artery. Calcified plaque in the proximal left subclavian artery without stenosis. Normal left vertebral artery origin. Tortuous left V1 segment. Tortuous left V2 and V3 segments with otherwise negative cervical left vertebral artery. CTA HEAD Posterior circulation: Both distal vertebral arteries taper in the V4 segments without focal stenosis. Both PICA origins are patent. Patent vertebrobasilar junction. No basilar stenosis. Normal SCA and left PCA origins. Mild to moderate irregularity of the right PCA origin and proximal P1 segment with stenosis.  Similar right P2 segment findings (series 507, image 19). Mild left PCA irregularity. Moderate distal left PCA (P3 segment irregularity and stenosis (series 506, image 33). Posterior communicating arteries are diminutive or absent. Anterior circulation: Both ICA siphons are patent. There is moderate left siphon calcified plaque without significant  stenosis. Similar moderate calcified plaque throughout the right ICA siphon with no significant stenosis. Patent carotid termini, MCA and ACA origins. The left ACA and a small anterior communicating artery are within normal limits. There is mild to moderate right A1 segment irregularity, and the right A2 segment is occluded proximal to the pericallosal artery level. There is mild to moderate distal left ACA branch irregularity (series 506, image 30). Right MCA M1 segment is mildly irregular. There is up to moderate mid right M1 segment irregularity and stenosis as seen on series 505, image 18 which is stable from the 2016 MRA. The right MCA bifurcation is patent. There is moderate right M2 branch irregularity and stenosis (series 506, image 22). The left mid M1 segment demonstrates moderate irregularity and stenosis (series 505, image 20 and series 504, image 176) which appears stable to the 2016 MRA. The left MCA bifurcation remains patent. There is only mild left M2 branch irregularity and stenosis. Venous sinuses: Patent. Anatomic variants: None. Review of the MIP images confirms the above findings IMPRESSION: 1. CT Perfusion is negative for evidence of core infarct and CTA is negative for emergent large vessel occlusion. A text page with preliminary findings of negative ELVO was sent to [facility] on 08/06/2016 at 0832 hours. 2. Generalized arterial tortuosity in the neck with mild atherosclerosis and no stenosis. 3. More moderate to severe chronic intracranial atherosclerosis and stenosis with chronic occlusion of the distal right ACA. Significant proximal stenoses which appear stable to the 2016 intracranial MRA include - Moderate bilateral MCA M1 segment stenoses, and - Moderate Right P1 and P2 segments stenoses. 4. Up to moderate distal branch irregularity and stenosis in the left PCA, left ACA, and right greater than left MCA territories. Electronically Signed   By: Odessa Fleming M.D.   On: 08/06/2016  09:06   Mr Maxine Glenn Head Wo Contrast  Result Date: 08/06/2016 CLINICAL DATA:  83 y/o F; slurred speech and right facial drooping. Leftward gaze. EXAM: MRI HEAD WITHOUT CONTRAST MRA HEAD WITHOUT CONTRAST TECHNIQUE: Multiplanar, multiecho pulse sequences of the brain and surrounding structures were obtained without intravenous contrast. Angiographic images of the head were obtained using MRA technique without contrast. COMPARISON:  CT head, CT angiogram head, and CT cerebral perfusion dated 08/06/2016. MRI brain and MRA head dated 01/18/2015. FINDINGS: MRI HEAD FINDINGS Brain: 12 x 7 mm focus of diffusion restriction (AP by ML series 3, image 30 series 300, image 30) centered within the left posterior limb of internal capsule consistent with acute/ early subacute infarction. Chronic distal right ACA distribution infarction with laminar necrosis. Chronic infarct within the left lentiform nucleus. Stable background of chronic microvascular ischemic changes and parenchymal volume loss of the brain. No new susceptibility hypointensity to indicate interval intracranial hemorrhage. Vascular: As below. Skull and upper cervical spine: Choose Sinuses/Orbits: Mild right maxillary sinus mucosal thickening and ethmoid sinus mucosal thickening. Bilateral intra-ocular lens replacement. Trace left mastoid tip effusion. Otherwise negative. Other: None. MRA HEAD FINDINGS Moderate motion artifact, suboptimal evaluation for subtle stenosis or aneurysm. No gross interval change from prior CT angiogram with persistent chronic occlusion of the distal right anterior cerebral artery and intracranial atherosclerosis with areas of mild-to-moderate stenosis most pronounced  and bilateral M1 and P1 segments. No proximal large vessel occlusion of the bilateral middle cerebral arteries, left anterior cerebral artery, bilateral posterior cerebral arteries, vertebral arteries, internal carotid arteries, or the basilar artery. IMPRESSION: 1. Small  acute/ early subacute infarct within the left posterior limb of internal capsule. No acute hemorrhage identified. 2. Stable chronic distal right ACA and left lentiform nuclei infarcts. 3. Stable moderate chronic microvascular ischemic changes and parenchymal volume loss of the brain. 4. Mild paranasal sinus disease. 5. Motion degraded time-of-flight MRA. No gross interval change from prior CT angiogram. No acute proximal large vessel occlusion. These results will be called to the ordering clinician or representative by the Radiologist Assistant, and communication documented in the PACS or zVision Dashboard. Electronically Signed   By: Mitzi Hansen M.D.   On: 08/06/2016 15:09   Mr Brain Wo Contrast  Result Date: 08/06/2016 CLINICAL DATA:  83 y/o F; slurred speech and right facial drooping. Leftward gaze. EXAM: MRI HEAD WITHOUT CONTRAST MRA HEAD WITHOUT CONTRAST TECHNIQUE: Multiplanar, multiecho pulse sequences of the brain and surrounding structures were obtained without intravenous contrast. Angiographic images of the head were obtained using MRA technique without contrast. COMPARISON:  CT head, CT angiogram head, and CT cerebral perfusion dated 08/06/2016. MRI brain and MRA head dated 01/18/2015. FINDINGS: MRI HEAD FINDINGS Brain: 12 x 7 mm focus of diffusion restriction (AP by ML series 3, image 30 series 300, image 30) centered within the left posterior limb of internal capsule consistent with acute/ early subacute infarction. Chronic distal right ACA distribution infarction with laminar necrosis. Chronic infarct within the left lentiform nucleus. Stable background of chronic microvascular ischemic changes and parenchymal volume loss of the brain. No new susceptibility hypointensity to indicate interval intracranial hemorrhage. Vascular: As below. Skull and upper cervical spine: Choose Sinuses/Orbits: Mild right maxillary sinus mucosal thickening and ethmoid sinus mucosal thickening. Bilateral  intra-ocular lens replacement. Trace left mastoid tip effusion. Otherwise negative. Other: None. MRA HEAD FINDINGS Moderate motion artifact, suboptimal evaluation for subtle stenosis or aneurysm. No gross interval change from prior CT angiogram with persistent chronic occlusion of the distal right anterior cerebral artery and intracranial atherosclerosis with areas of mild-to-moderate stenosis most pronounced and bilateral M1 and P1 segments. No proximal large vessel occlusion of the bilateral middle cerebral arteries, left anterior cerebral artery, bilateral posterior cerebral arteries, vertebral arteries, internal carotid arteries, or the basilar artery. IMPRESSION: 1. Small acute/ early subacute infarct within the left posterior limb of internal capsule. No acute hemorrhage identified. 2. Stable chronic distal right ACA and left lentiform nuclei infarcts. 3. Stable moderate chronic microvascular ischemic changes and parenchymal volume loss of the brain. 4. Mild paranasal sinus disease. 5. Motion degraded time-of-flight MRA. No gross interval change from prior CT angiogram. No acute proximal large vessel occlusion. These results will be called to the ordering clinician or representative by the Radiologist Assistant, and communication documented in the PACS or zVision Dashboard. Electronically Signed   By: Mitzi Hansen M.D.   On: 08/06/2016 15:09   Ct Cerebral Perfusion W Contrast  Result Date: 08/06/2016 CLINICAL DATA:  83 year old female with right facial droop and aphasia. Code stroke. Personal history of distal right ACA infarct and intracranial atherosclerosis. Initial encounter. EXAM: CT PERFUSION BRAIN CT ANGIOGRAPHY HEAD AND NECK TECHNIQUE: Multiphase CT imaging of the brain was performed following IV bolus contrast injection. Subsequent parametric perfusion maps were calculated using RAPID software. Multidetector CT imaging of the head and neck was performed using the standard protocol  during bolus administration of intravenous contrast. Multiplanar CT image reconstructions and MIPs were obtained to evaluate the vascular anatomy. Carotid stenosis measurements (when applicable) are obtained utilizing NASCET criteria, using the distal internal carotid diameter as the denominator. CONTRAST:  100 mL Isovue 370 COMPARISON:  Head CT without contrast today 0815 hours. Brain MRI and intracranial MRA 01/18/2015. FINDINGS: CT BRAIN PERFUSION FINDINGS CBF (<30%) Volume:  0 mL Perfusion (Tmax>6.0s) volume: 4 mL (likely artifactual) Mismatch Volume: Not applicable Infarction Location: Not applicable CTA NECK FINDINGS Skeleton: Advanced degenerative changes in the cervical spine. No acute osseous abnormality identified. Upper chest: No acute apical lung findings aside from mild atelectasis. No superior mediastinal lymphadenopathy. Other neck: Diminutive or absent thyroid. Negative larynx, pharynx, parapharyngeal spaces, retropharyngeal space, sublingual space, submandibular glands and parotid glands. No cervical lymphadenopathy. Aortic arch: 3 vessel arch configuration. Mild to moderate mostly distal arch calcified atherosclerosis. Right carotid system: No brachiocephalic artery stenosis. Tortuous right CCA origin and proximal right CCA with a kinked appearance but no other stenosis. Minimal circumferential soft plaque in the right CCA. At the right carotid bifurcation there is mostly calcified plaque without stenosis. Mildly tortuous otherwise negative cervical right ICA. Left carotid system: Minimal plaque at the left CCA origin without stenosis. Tortuous left CCA. No stenosis proximal to the bifurcation. Mild calcified plaque at the bifurcation without stenosis. Mildly tortuous cervical left ICA. Vertebral arteries: Calcified plaque and tortuosity at the proximal right subclavian artery origin. Normal right vertebral artery origin. Tortuous but otherwise negative cervical right vertebral artery. Calcified  plaque in the proximal left subclavian artery without stenosis. Normal left vertebral artery origin. Tortuous left V1 segment. Tortuous left V2 and V3 segments with otherwise negative cervical left vertebral artery. CTA HEAD Posterior circulation: Both distal vertebral arteries taper in the V4 segments without focal stenosis. Both PICA origins are patent. Patent vertebrobasilar junction. No basilar stenosis. Normal SCA and left PCA origins. Mild to moderate irregularity of the right PCA origin and proximal P1 segment with stenosis. Similar right P2 segment findings (series 507, image 19). Mild left PCA irregularity. Moderate distal left PCA (P3 segment irregularity and stenosis (series 506, image 33). Posterior communicating arteries are diminutive or absent. Anterior circulation: Both ICA siphons are patent. There is moderate left siphon calcified plaque without significant stenosis. Similar moderate calcified plaque throughout the right ICA siphon with no significant stenosis. Patent carotid termini, MCA and ACA origins. The left ACA and a small anterior communicating artery are within normal limits. There is mild to moderate right A1 segment irregularity, and the right A2 segment is occluded proximal to the pericallosal artery level. There is mild to moderate distal left ACA branch irregularity (series 506, image 30). Right MCA M1 segment is mildly irregular. There is up to moderate mid right M1 segment irregularity and stenosis as seen on series 505, image 18 which is stable from the 2016 MRA. The right MCA bifurcation is patent. There is moderate right M2 branch irregularity and stenosis (series 506, image 22). The left mid M1 segment demonstrates moderate irregularity and stenosis (series 505, image 20 and series 504, image 176) which appears stable to the 2016 MRA. The left MCA bifurcation remains patent. There is only mild left M2 branch irregularity and stenosis. Venous sinuses: Patent. Anatomic variants:  None. Review of the MIP images confirms the above findings IMPRESSION: 1. CT Perfusion is negative for evidence of core infarct and CTA is negative for emergent large vessel occlusion. A text page with preliminary findings of  negative ELVO was sent to [facility] on 08/06/2016 at 0832 hours. 2. Generalized arterial tortuosity in the neck with mild atherosclerosis and no stenosis. 3. More moderate to severe chronic intracranial atherosclerosis and stenosis with chronic occlusion of the distal right ACA. Significant proximal stenoses which appear stable to the 2016 intracranial MRA include - Moderate bilateral MCA M1 segment stenoses, and - Moderate Right P1 and P2 segments stenoses. 4. Up to moderate distal branch irregularity and stenosis in the left PCA, left ACA, and right greater than left MCA territories. Electronically Signed   By: Odessa Fleming M.D.   On: 08/06/2016 09:06   Dg Chest Portable 1 View  Result Date: 08/06/2016 CLINICAL DATA:  Stroke symptoms EXAM: PORTABLE CHEST 1 VIEW COMPARISON:  02/07/2015 FINDINGS: Normal cardiac silhouette. Improvement aeration lung bases. Small LEFT effusion remains. Chronic bronchitic markings. Hiatal hernia noted. IMPRESSION: 1. Hiatal hernia and LEFT effusion. 2. No pulmonary edema or infiltrate. Electronically Signed   By: Genevive Bi M.D.   On: 08/06/2016 09:13   Ct Head Code Stroke W/o Cm  Result Date: 08/06/2016 CLINICAL DATA:  Code stroke. 83 year old female with aphasia and right facial droop. Initial encounter. EXAM: CT HEAD WITHOUT CONTRAST TECHNIQUE: Contiguous axial images were obtained from the base of the skull through the vertex without intravenous contrast. COMPARISON:  12/22/2015 and earlier. FINDINGS: Brain: Sequelae of posterior right ACA infarct with encephalomalacia in the posterosuperior right frontal lobe and medial right parietal lobe. No acute intracranial hemorrhage identified. No midline shift, mass effect, or evidence of  intracranial mass lesion. No ventriculomegaly. There is subtle loss of gray-white matter differentiation in the posterior left MCA territory such as on series 21, image 21 in the posterior frontal and parietal lobe. Stable either dilated perivascular space or chronic lacunar infarct at the inferior left basal ganglia. Vascular: Calcified atherosclerosis at the skull base. Suspicious hyperdensity at the left ICA terminus on coronal image 36. Skull: No acute osseous abnormality identified. Sinuses/Orbits: Visualized paranasal sinuses and mastoids are stable and well pneumatized. Other: No acute orbit or scalp soft tissue findings. ASPECTS [facility] Stroke Program Early CT Score) - Ganglionic level infarction (caudate, lentiform nuclei, internal capsule, insula, M1-M3 cortex): 7 - Supraganglionic infarction (M4-M6 cortex): 2 Total score (0-10 with 10 being normal): 9 IMPRESSION: 1. Suspicious hyperdensity at the left ICA terminus and suspect subtle loss of gray-white differentiation in the posterior left MCA territory. No acute hemorrhage or intracranial mass effect. 2. ASPECTS is 9. 3. Chronic posterior right ACA infarct. 4. Study discussed by telephone with Dr. Aram Beecham MESNER on 08/06/2016 at 0823 hours Electronically Signed   By: Odessa Fleming M.D.   On: 08/06/2016 08:29    Scheduled Meds: . apixaban  5 mg Oral BID  . aspirin  300 mg Rectal Daily   Or  . aspirin  325 mg Oral Daily  . baclofen  10 mg Oral TID  . baclofen  20 mg Oral QHS  . dorzolamide-timolol  1 drop Both Eyes BID  . gabapentin  100 mg Oral BID WC  . gabapentin  600 mg Oral QHS  . latanoprost  1 drop Both Eyes QHS  . [START ON 08/08/2016] levothyroxine  88 mcg Oral QAC breakfast  . lidocaine  1 patch Transdermal Q24H  . metoprolol  2.5 mg Intravenous Q6H  . metoprolol tartrate  25 mg Oral BID  . mirtazapine  15 mg Oral QHS  . pantoprazole  40 mg Oral BID  . tamsulosin  0.4 mg Oral Daily   Continuous Infusions:   LOS: 1  day   Cellie Dardis, Scheryl Marten, MD [facility] Pager [phone]  If 7PM-7AM, please contact night-coverage [email or web address] Password [facility] 08/07/2016, 3:58 PM

## 2016-08-07 NOTE — Progress Notes (Signed)
Pt observed not no have voided since cath in the ED, bladder scanned to a volume 734, in and out cath done, drained about of clear urine out, pt made comfortable in bed, will however continue to monitor. Obasogie-Asidi, Maleki Hippe Efe

## 2016-08-07 NOTE — Progress Notes (Signed)
  Echocardiogram 2D Echocardiogram with Definity has been performed.  Angela SavoyCasey Keith Angela Keith 08/07/2016, 4:14 PM

## 2016-08-07 NOTE — Evaluation (Signed)
Speech Language Pathology Evaluation Patient Details Name: Angela Keith MRN: [id number] DOB: 1929/08/24 Today's Date: 08/07/2016 Time: 1220-1229 SLP Time Calculation (min) (ACUTE ONLY): 9 min  Problem List:  Patient Active Problem List   Diagnosis Date Noted  . Acute ischemic left MCA stroke (HCC) 08/06/2016  . Acute urinary retention 08/06/2016  . Aphasia   . Depression 02/03/2016  . Insomnia 02/03/2016  . Chronic musculoskeletal pain 02/03/2016  . GERD (gastroesophageal reflux disease) 12/31/2015  . Pressure ulcer 12/23/2015  . Atrial fibrillation (HCC) 06/06/2015  . Embolic stroke (HCC) 03/22/2015  . Left hemiparesis (HCC)   . Cryptogenic stroke (HCC)   . H/O TIA (transient ischemic attack) and stroke   . Spinal stenosis, lumbar region, with neurogenic claudication 01/17/2015  . Lumbar stenosis 01/17/2015  . 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 Essential hypertension 11/10/2014  . Chronic back pain 11/08/2014  . Cerebral infarction due to thrombosis of cerebral artery (HCC)   . Hyperlipidemia LDL goal <70 09/07/2014  . Presumed CVA (cerebral infarction) s/p IV tPA, MRI negative 09/05/2014  . Hypertensive urgency 09/05/2014  . Right bundle branch block 03/21/2014  . Cough 10/31/2012  . Elevated lipase 10/30/2012  . HTN (hypertension) 10/30/2012  . Hypothyroidism 10/30/2012   Past Medical History:  Past Medical History:  Diagnosis Date  . Arthritis   . Atrial fibrillation (HCC)   . Chronic neck pain   . DJD (degenerative joint disease), cervical   . DJD (degenerative joint disease), lumbar   . Dysphagia   . GERD (gastroesophageal reflux disease)   . Glaucoma   . HTN (hypertension) 10/30/2012  . Hyperlipidemia   . Hypothyroidism   . Insomnia   . Nocturia   . Right bundle branch block 03/21/2014  . Rotoscoliosis   . Severe hearing loss   . Shortness of breath dyspnea   . Stroke (HCC)   . Unspecified hypothyroidism 10/30/2012   Past Surgical  History:  Past Surgical History:  Procedure Laterality Date  . BACK SURGERY     lower back   . bladder laser surgery    . BREAST SURGERY    . cataract surgery    . EP IMPLANTABLE DEVICE N/A 01/19/2015   Procedure: Loop Recorder Insertion;  Surgeon: Marinus Maw, MD;  Location: [facility] INVASIVE CV LAB;  Service: Cardiovascular;  Laterality: N/A;  . ESOPHAGOGASTRODUODENOSCOPY (EGD) WITH ESOPHAGEAL DILATION  06/08/2012   Procedure: ESOPHAGOGASTRODUODENOSCOPY (EGD) WITH ESOPHAGEAL DILATION;  Surgeon: Charolett Bumpers, MD;  Location: [facility] ENDOSCOPY;  Service: Endoscopy;  Laterality: N/A;  . facail surgery  2000   facelift and eyelid lift  . left carpal tunnel surgery    . LUMBAR LAMINECTOMY/DECOMPRESSION MICRODISCECTOMY N/A 01/17/2015   Procedure: Laminectomy and Foraminotomy - L2-L3 - L3-L4;  Surgeon: [name], MD;  Location: [facility] NEURO ORS;  Service: Neurosurgery;  Laterality: N/A;  Laminectomy and Foraminotomy - L2-L3 - L3-L4  . TEE WITHOUT CARDIOVERSION N/A 01/19/2015   Procedure: TRANSESOPHAGEAL ECHOCARDIOGRAM (TEE);  Surgeon: Chrystie Nose, MD;  Location: [facility] ENDOSCOPY;  Service: Cardiovascular;  Laterality: N/A;   HPI:  Patient is an 83 y/o female with h/o HTN, HLD, hypothyroid, CVA w/ L weakness admitted with right facial drooping, dysarthria, known previous CVA with left-sided deficits. Imaging consistent with acute left MCA CVA.   Assessment / Plan / Recommendation Clinical Impression  Pt has a fluent, expressive aphasia with comprehension of simple and even mildly complex information relatively intact. Verbal output is limited unless prompted, but she participated in  confrontational naming tasks with 100% accuracy given self-corrections. She stated and spelled her full name. Phonemic paraphasias and neologisms are noted in more spontaneous speech. Pt will benefit from skilled SLP f/u to maximize functional communication.    SLP Assessment  Patient needs continued Speech Lanaguage Pathology  Services    Follow Up Recommendations  Skilled Nursing facility    Frequency and Duration min 2x/week  2 weeks      SLP Evaluation Cognition  Overall Cognitive Status: Difficult to assess (aphasia) Arousal/Alertness: Awake/alert Orientation Level: Oriented to person;Oriented to place;Oriented to situation Attention: Sustained Sustained Attention: Appears intact (with basic, familiar tasks)       Comprehension  Auditory Comprehension Overall Auditory Comprehension: Appears within functional limits for tasks assessed    Expression Expression Primary Mode of Expression: Verbal Verbal Expression Overall Verbal Expression: Impaired Initiation: No impairment Automatic Speech: Name Level of Generative/Spontaneous Verbalization: Phrase Naming: Impairment Confrontation: Within functional limits Verbal Errors: Neologisms Non-Verbal Means of Communication: Not applicable   Oral / Motor  Oral Motor/Sensory Function Overall Oral Motor/Sensory Function: Mild impairment Facial ROM: Reduced right;Suspected CN VII (facial) dysfunction Facial Symmetry: Abnormal symmetry right;Suspected CN VII (facial) dysfunction Facial Strength: Reduced right;Suspected CN VII (facial) dysfunction Lingual ROM: Reduced right;Suspected CN XII (hypoglossal) dysfunction Lingual Symmetry: Abnormal symmetry right;Suspected CN XII (hypoglossal) dysfunction Lingual Strength: Reduced;Suspected CN XII (hypoglossal) dysfunction Lingual Sensation: Reduced;Suspected CN VII (facial) dysfunction-anterior 2/3 tongue Motor Speech Overall Motor Speech: Impaired Respiration: Within functional limits Phonation: Low vocal intensity Resonance: Within functional limits Articulation: Impaired Level of Impairment: Phrase Intelligibility: Intelligibility reduced Word: 50-74% accurate   GO                    Angela Keith, Angela Keith 08/07/2016, 3:29 PM  Angela Keith, M.A. CCC-SLP [phone]

## 2016-08-07 NOTE — Progress Notes (Signed)
STROKE TEAM PROGRESS NOTE   HISTORY OF PRESENT ILLNESS (per record) Angela Keith is a 83 y.o. female last known well at 5:30 PM last night 08/05/2016. She called her daughter this morning and said help me, but was not making sense otherwise. Her daughter went over and found her on the ground having fallen out of the wheelchair. She noticed that she was having trouble with her speech and noticed right facial droop as well. Due to this EMS was called, she initially had some improvement but then waxed and waned in the ED. Therefore a code stroke was called. Due to her symptoms, CT perfusion was obtained which was negative. Also of note, her daughter notes that she has been having worsening spasms of her left leg which is unusual for her, though they do happen from time to time at night. Patient was not administered IV t-PA secondary to being outside of the window. She was admitted for further evaluation and treatment.   SUBJECTIVE (INTERVAL HISTORY) Daughter and granddaughter are at bedside. Pt still has dysarthria and right facial droop. Stated compliance with eliquis at home.    OBJECTIVE Temp:  [98.6 F (37 C)-99 F (37.2 C)] 99 F (37.2 C) (01/31 0000) Pulse Rate:  [29-97] 90 (01/31 0600) Cardiac Rhythm: Normal sinus rhythm (01/31 0700) Resp:  [18-25] 18 (01/31 0600) BP: (139-168)/(72-99) 157/92 (01/31 0600) SpO2:  [90 %-100 %] 97 % (01/31 0600) Weight:  [64.3 kg (141 lb 12.8 oz)] 64.3 kg (141 lb 12.8 oz) (01/30 2032)  CBC:  Recent Labs Lab 08/06/16 0759 08/06/16 0812  WBC 6.6  --   NEUTROABS 3.3  --   HGB 12.8 14.3  HCT 40.3 42.0  MCV 90.0  --   PLT 246  --     Basic Metabolic Panel:  Recent Labs Lab 08/06/16 0759 08/06/16 0812  NA 142 145  K 4.0 3.9  CL 107 103  CO2 31  --   GLUCOSE 105* 103*  BUN 15 19  CREATININE 0.93 1.00  CALCIUM 9.7  --     Lipid Panel:    Component Value Date/Time   CHOL 180 08/07/2016 0715   TRIG 40 08/07/2016 0715   HDL 54 08/07/2016  0715   CHOLHDL 3.3 08/07/2016 0715   VLDL 8 08/07/2016 0715   LDLCALC 118 (H) 08/07/2016 0715   HgbA1c:  Lab Results  Component Value Date   HGBA1C 6.1 (H) 01/19/2015   Urine Drug Screen:    Component Value Date/Time   LABOPIA NONE DETECTED 08/06/2016 1018   COCAINSCRNUR NONE DETECTED 08/06/2016 1018   LABBENZ NONE DETECTED 08/06/2016 1018   AMPHETMU NONE DETECTED 08/06/2016 1018   THCU NONE DETECTED 08/06/2016 1018   LABBARB NONE DETECTED 08/06/2016 1018      IMAGING I have personally reviewed the radiological images below and agree with the radiology interpretations.  Ct Head Code Stroke W/o Cm 08/06/2016 1. Suspicious hyperdensity at the left ICA terminus and suspect subtle loss of gray-white differentiation in the posterior left MCA territory. No acute hemorrhage or intracranial mass effect. 2. ASPECTS is 9. 3. Chronic posterior right ACA infarct.   Ct Cerebral Perfusion W Contrast 08/06/2016 1. CT Perfusion is negative for evidence of core infarct and CTA is negative for emergent large vessel occlusion.   Ct Angio Head W Or Wo Contrast Ct Angio Neck W Or Wo Contrast 08/06/2016 2. Generalized arterial tortuosity in the neck with mild atherosclerosis and no stenosis. 3. More moderate to severe chronic  intracranial atherosclerosis and stenosis with chronic occlusion of the distal right ACA. Significant proximal stenoses which appear stable to the 2016 intracranial MRA include - Moderate bilateral MCA M1 segment stenoses, and - Moderate Right P1 and P2 segments stenoses. 4. Up to moderate distal branch irregularity and stenosis in the left PCA, left ACA, and right greater than left MCA territories.   Mr Brain 51 Contrast Mr Maxine Glenn Head Wo Contrast 08/06/2016 1. Small acute/ early subacute infarct within the left posterior limb of internal capsule. No acute hemorrhage identified. 2. Stable chronic distal right ACA and left lentiform nuclei infarcts. 3. Stable moderate chronic  microvascular ischemic changes and parenchymal volume loss of the brain. 4. Mild paranasal sinus disease. 5. Motion degraded time-of-flight MRA. No gross interval change from prior CT angiogram. No acute proximal large vessel occlusion.   2-D echocardiogram Left ventricle: The cavity size was normal. Wall thickness was   increased in a pattern of mild LVH. Systolic function was normal.   The estimated ejection fraction was in the range of 55% to 60%.   Wall motion was normal; there were no regional wall motion   abnormalities. Features are consistent with a pseudonormal left   ventricular filling pattern, with concomitant abnormal relaxation   and increased filling pressure (grade 2 diastolic dysfunction).   PHYSICAL EXAM  Temp:  [98.2 F (36.8 C)-99 F (37.2 C)] 98.2 F (36.8 C) (01/31 1700) Pulse Rate:  [77-92] 78 (01/31 1700) Resp:  [16-20] 16 (01/31 1700) BP: (121-163)/(72-92) 121/76 (01/31 1700) SpO2:  [97 %-100 %] 98 % (01/31 1700)  General - Well nourished, well developed, in no apparent distress.  Ophthalmologic - Fundi not visualized due to eye movement.  Cardiovascular - Regular rate and rhythm.  Mental Status -  Level of arousal and orientation to time, place, and person were intact. Language including expression, naming, repetition, comprehension was assessed and found intact, but moderate dysarthria  Cranial Nerves II - XII - II - Visual field intact OU. III, IV, VI - Extraocular movements intact. V - Facial sensation intact bilaterally. VII - right facial droop. VIII - Hearing & vestibular intact bilaterally. X - Palate elevates symmetrically, but moderate dysarthria. XI - Chin turning & shoulder shrug intact bilaterally. XII - Tongue protrusion intact.  Motor Strength - The patient's strength was normal in all extremities except LLE 4-/5 residue from previous stroke and pronator drift was absent.  Bulk was normal and fasciculations were absent.   Motor Tone -  Muscle tone was assessed at the neck and appendages and was normal.  Reflexes - The patient's reflexes were 1+ in all extremities and she had no pathological reflexes.  Sensory - Light touch, temperature/pinprick were assessed and were symmetrical.    Coordination - The patient had normal movements in the hands with no ataxia or dysmetria.  Tremor was absent.  Gait and Station - deferred.   ASSESSMENT/PLAN [name] is a 83 y.o. female with history of CVA in 2016 with left-sided deficits that are persistent and associated with left lower extremity spasticity, hypertension, hypothyroidism, atrial fibrillation on eliquis, spinal stenosis with neurogenic claudication, dyslipidemia, GERD and insomnia presenting with garbled speech. She did not receive IV t-PA due to delay in arrival.   Stroke:  Left PLIC/CR infarct likely small vessel disease. However, pt does have pAfib on eliquis, embolic stroke can not be excluded.  Resultant  Right facial droop and dysarthria  Code stroke CT suspicious hyperdensity left ICA terminus. Aspects 9  CT perfusion negative for core infarct   CTA head and neck negative for emergent large vessel occlusion. Chronic occlusion distal R ACA. Moderate bilateral M1 stenosis. Moderate R P1 and P2 stenosis.  MRI  left PLIC/CR infarct. Old right ACA and left lentiform nuclei infarcts.   MRA  No large vessel occlusion. No gross interval change from prior CT angiogram, but diffuse atherosclerosis  2D Echo EF 55-60%   LDL 118  HgbA1c pending  Eliquis for VTE prophylaxis  DIET [name] Room service appropriate? Yes; Fluid consistency: Thin  Eliquis (apixaban) daily prior to admission, now on aspirin 325 mg daily and Eliquis (apixaban) daily. Recommend ASA 81mg  along with eliquis 5mg  bid for further stroke prevention.   Patient counseled to be compliant with her antithrombotic medications  Ongoing aggressive stroke risk factor management  Therapy  recommendations:  SNF  Disposition:  Pending  Atrial Fibrillation  Home anticoagulation:  Eliquis (apixaban) daily continued in the hospital  Found in 04/2015 after loop recorder placement  Continue eliquis 5mg  bid at discharge  Hx stroke/TIA  01/20/15 - right ACA and punctate left thalamic infarcts, embolic secondary to unknown source. Loop recorder placed and found to have paroxysmal atrial fibrillation - on eliquis - with left sided residue  09/05/14 - presumed left brain stroke with right facial weakness and dysarthria status post IV TPA, MRI negative - DAPT with pravastatin  [facility]    Hypertension  Stable  On metoprolol  Permissive hypertension (OK if < 220/120) but gradually normalize in 5-7 days  Long-term BP goal normotensive  Hyperlipidemia  Home meds:  No statin  LDL 118, goal < 70  Intolerance with lipitor, was on pravastatin in the past  Add pravastatin 20mg   Continue statin at discharge  Other Stroke Risk Factors  Advanced age  Family hx stroke (father)  Other Active Problems  Acute urinary retention  Hypothyroidism, on Synthroid  Spinal stenosis, lumbar region, with neurologic claudication  GERD  Insomnia, [facility]  Hospital day # 1  Neurology will sign off. Please call with questions. Pt will [facility] 6 weeks. Thanks for the consult.  Marvel PlanJindong Boone Gear, MD PhD Stroke Neurology 08/07/2016 10:25 PM    To contact Stroke Continuity provider, please refer to [email or web address]. After hours, contact General Neurology

## 2016-08-08 ENCOUNTER — Inpatient Hospital Stay (HOSPITAL_COMMUNITY): Payer: Medicare Other

## 2016-08-08 DIAGNOSIS — R400 Somnolence: Secondary | ICD-10-CM

## 2016-08-08 DIAGNOSIS — E034 Atrophy of thyroid (acquired): Secondary | ICD-10-CM

## 2016-08-08 LAB — BASIC METABOLIC PANEL
Anion gap: 11 (ref 5–15)
BUN: 14 mg/dL (ref 6–20)
CO2: 25 mmol/L (ref 22–32)
Calcium: 9.2 mg/dL (ref 8.9–10.3)
Chloride: 102 mmol/L (ref 101–111)
Creatinine, Ser: 0.78 mg/dL (ref 0.44–1.00)
GFR calc Af Amer: >60 mL/min (ref >60)
GLUCOSE: 95 mg/dL (ref 65–99)
POTASSIUM: 4.2 mmol/L (ref 3.5–5.1)
Sodium: 138 mmol/L (ref 135–145)

## 2016-08-08 LAB — HEMOGLOBIN A1C
HEMOGLOBIN A1C: 5.8 % — AB (ref 4.8–5.6)
MEAN PLASMA GLUCOSE: 120 mg/dL

## 2016-08-08 MED ORDER — BACLOFEN 10 MG PO TABS
5.0000 mg | ORAL_TABLET | Freq: Three times a day (TID) | ORAL | Status: DC
  Administered 2016-08-09: 5 mg via ORAL
  Filled 2016-08-08: qty 1

## 2016-08-08 MED ORDER — IOPAMIDOL (ISOVUE-370) INJECTION 76%
INTRAVENOUS | Status: AC
  Administered 2016-08-08: 50 mL
  Filled 2016-08-08: qty 50

## 2016-08-08 MED ORDER — SODIUM CHLORIDE 0.9 % IV SOLN
INTRAVENOUS | Status: DC
  Administered 2016-08-08: 13:00:00 via INTRAVENOUS
  Administered 2016-08-09: 1000 mL via INTRAVENOUS

## 2016-08-08 NOTE — Progress Notes (Signed)
Pt continues to be unarousable but does grunt and squeezing your hand, granddaughter is in room and will stay over night, will continue to monitor.

## 2016-08-08 NOTE — Progress Notes (Signed)
Bed offers given to daughter in law. Bed choice: [facility].  Notified [facility] regarding discharge plan and bed choice.  Will continue to assist with disposition.  Plan SNF when medically stable.  Angela EmoryHannah Tyshun Tuckerman LCSW, MSW Clinical Social Work: Optician, dispensingystem [employer] Coverage for :  [phone]

## 2016-08-08 NOTE — Clinical Social Work Note (Signed)
Clinical Social Work Assessment  Patient Details  Name: Angela Keith MRN: [id number] Date of Birth: [birth year]  Date of referral:  08/08/16               Reason for consult:  Facility Placement, Discharge Planning                Permission sought to share information with:  Case Manager, Customer service manager, Family Supports Permission granted to share information::  Yes, Verbal Permission Granted  Name::        Agency::     Relationship::  Daughter in Sports coach, son, [employer] Copywriter, advertising Information:     Housing/Transportation Living arrangements for the past 2 months:  Single Family Home Source of Information:  Medical Team, Case Manager, Adult Children, Other (Comment Required) (daughter in Sports coach) Patient Interpreter Needed:  None Criminal Activity/Legal Involvement Pertinent to Current Situation/Hospitalization:  No - Comment as needed Significant Relationships:  Adult Children, Other Family Members Lives with:  Self (lives behind son and his wife) Do you feel safe going back to the place where you live?  No Need for family participation in patient care:  Yes (Comment)  Care giving concerns:  LCSW met with daughter in law at bedside.  Daughter in law reports patient does live alone and was independent with wheelchair and transfers.  Reports family lives right in front of patient in home (son and daughter in law) and available at all times. Also reports patient has a caregiver who splits time with family for additional care.  Daughter in law is agreeable for SNF recommendation, but also for patient to come home if she wants to come home.  Reports per previous strokes she has been to [facility] as well as [facility].  Discussed with patient at bedside both options.  She reports she would [facility].  She continues to have issues with speech and per daughter in law, patient did very well with SPL at [facility].   Social Worker assessment / plan:  LCSW completed assessment  and consult for disposition/SNF recommendation. SNF work up completed. Needs bed offers.  Plan at this time:  SNF when medically stable. Family very involved and in agreement with plan.  Employment status:  Retired Forensic scientist:  [employer] PT Recommendations:  [facility] / Referral to community resources:  [place]  Patient/Family's Response to care:  Understanding and very appreciative of care and services.  Agreeable with patient wishes to SNF.  Patient/Family's Understanding of and Emotional Response to Diagnosis, Current Treatment, and Prognosis:  Daughter in law voices that she takes patient to all appointments and is available 24/7 if needs arise and at home.  She is well aware of stroke affects on patient verbalizes her prognosis and overall goals with patient.  Emotional Assessment Appearance:  Appears stated age Attitude/Demeanor/Rapport:  Apprehensive Affect (typically observed):  Anxious, Frustrated, Other (difficulty with speech, but overall pleasant) Orientation:  Oriented to Self, Oriented to Place, Oriented to Situation Alcohol / Substance use:  Not Applicable Psych involvement (Current and /or in the community):  No (Comment)  Discharge Needs  Concerns to be addressed:  No discharge needs identified Readmission within the last 30 days:  No Current discharge risk:  None Barriers to Discharge:  No Barriers Identified, Continued Medical Work up   Lilly Cove, LCSW 08/08/2016, 10:13 AM

## 2016-08-08 NOTE — Clinical Social Work Placement (Signed)
   CLINICAL SOCIAL WORK PLACEMENT  NOTE  Date:  08/08/2016  Patient Details  Name: Norva PavlovRachel E Sugrue MRN: [id number] Date of Birth: June 04, 1930  Clinical Social Work is seeking post-discharge placement for this patient at the Skilled  Nursing Facility level of care (*CSW will initial, date and re-position this form in  chart as items are completed):  Yes   Patient/family provided with [facility] Work Department's list of facilities offering this level of care within the geographic area requested by the patient (or if unable, by the patient's family).  Yes   Patient/family informed of their freedom to choose among providers that offer the needed level of care, that participate in Medicare, Medicaid or managed care program needed by the patient, have an available bed and are willing to accept the patient.  Yes   Patient/family informed of [facility] ownership interest [facility] and [facility], as well as of the fact that they are under no obligation to receive care at these facilities.  PASRR submitted to EDS on       PASRR number received on       Existing PASRR number confirmed on 08/08/16     FL2 transmitted to all facilities in geographic area requested by pt/family on 08/08/16     FL2 transmitted to all facilities within larger geographic area on       Patient informed that his/her managed care company has contracts with or will negotiate with certain facilities, including the following:            Patient/family informed of bed offers received.  Patient chooses bed at       Physician recommends and patient chooses bed at      Patient to be transferred to   on  .  Patient to be transferred to facility by       Patient family notified on   of transfer.  Name of family member notified:        PHYSICIAN Please sign FL2     Additional Comment:    _______________________________________________ Raye Sorrowoble, Litzi Binning N, LCSW 08/08/2016, 10:18 AM

## 2016-08-08 NOTE — Progress Notes (Signed)
Notified MD of patient sleepiness. Patient not currently taking in much liquid. Urine in foley bag darker with sediment. Will continue to monitor. Lawson RadarHeather M Sher Hellinger

## 2016-08-08 NOTE — Progress Notes (Signed)
Occupational Therapy Evaluation Patient Details Name: Angela Keith MRN: [id number] DOB: Nov 13, 1929 Today's Date: 08/08/2016    History of Present Illness Patient is an 83 y/o female with h/o HTN, HLD, hypothyroid, CVA w/ L weakness admitted with right facial drooping, dysarthria, known previous CVA with left-sided deficits. Imaging consistent with acute left MCA CVA.   Clinical Impression   PTA, pt lived alone and was modified independent at wc level with ADL and mobility. Family assisted with IADL tasks. Pt extremely lethargic during assessment. Not consistently following commands. Pt moving BUE spontaneously. Pt with increased flexor spasticity and tone LLE. Recommend prevalon boot to reduce pressure L heel. Pt left in chair position with lights on and grand daughter present. Pt will need rehab at SNF. Will follow acutely. REcommend nsg use Maximove to mobilize pt OOB to recliner.     Follow Up Recommendations  SNF;Supervision/Assistance - 24 hour    Equipment Recommendations  Other (comment) (TBA)    Recommendations for Other Services       Precautions / Restrictions Precautions Precautions: Fall Precaution Comments: L LE flexor withdrawl Restrictions Weight Bearing Restrictions: No      Mobility Bed Mobility Overal bed mobility: Needs Assistance             General bed mobility comments: total A to roll  Transfers                 General transfer comment: unable to stand at this time    Balance                                            ADL      total A at this time                                         Vision Additional Comments: eyes closed 95% of session   Perception     Praxis Praxis Praxis-Other Comments: will further assess    Pertinent Vitals/Pain Pain Assessment: Faces Faces Pain Scale: No hurt     Hand Dominance Right   Extremity/Trunk Assessment Upper Extremity Assessment Upper  Extremity Assessment: Generalized weakness RUE Deficits / Details: moving BUE spontaeously. will further assess. difficult to assess due to level of arousal   Lower Extremity Assessment Lower Extremity Assessment: LLE deficits/detail RLE Deficits / Details: flexor synergy pattern. mod flexor tone.  LLE Deficits / Details: increased tone in flexion (close to 4 on Mod [place]); able to extend in supine, but tight in knee flexors and ankle PF       Communication Communication Communication: HOH;Expressive difficulties (dyarthria, aphasia). Pt appeared to shake her head yes when asked if her name was "Elain". Pt talking but words were unintelligible.   Cognition Arousal/Alertness: Lethargic;Suspect due to medications Behavior During Therapy: Flat affect Overall Cognitive Status: Difficult to assess (aphasia) Area of Impairment: Following commands;Attention       Following Commands: Follows one step commands inconsistently     Problem Solving: Slow processing;Decreased initiation;Requires verbal cues     General Comments   at risk for skin breakdown    Exercises       Shoulder Instructions      Home Living Family/patient expects to be discharged to:: Skilled nursing facility Living Arrangements: Alone  Available Help at Discharge: Available PRN/intermittently;Family;Personal care attendant Type of Home: House Home Access: Ramped entrance     Home Layout: Two level;Able to live on main level with bedroom/bathroom     Bathroom Shower/Tub: Chief Strategy Officer: Handicapped height     Home Equipment: Wheelchair - manual;Bedside commode;Tub bench;Grab bars - tub/shower;Hand held shower head   Additional Comments: life alert  Lives With: Alone;Other (Comment) (with family checking in)    Prior Functioning/Environment Level of Independence: Independent with assistive device(s)        Comments: daughter lives next door, had hired caregiver about 3 hours a  day until caregiver got sick; pt showers indep, has family to check in about every 2-3 hours; family brings food        OT Problem List: Decreased strength;Decreased range of motion;Decreased activity tolerance;Impaired balance (sitting and/or standing);Impaired vision/perception;Decreased coordination;Decreased cognition;Decreased safety awareness;Decreased knowledge of use of DME or AE;Impaired sensation;Impaired tone;Impaired UE functional use   OT Treatment/Interventions: Self-care/ADL training;Therapeutic exercise;Neuromuscular education;DME and/or AE instruction;Therapeutic activities;Splinting;Cognitive remediation/compensation;Visual/perceptual remediation/compensation;Patient/family education;Balance training    OT Goals(Current goals can be found in the care plan section) Acute Rehab OT Goals Patient Stated Goal: per family - to get better OT Goal Formulation: With family Time For Goal Achievement: 08/22/16 Potential to Achieve Goals: Fair ADL Goals Pt Will Perform Eating: with min assist;sitting Pt Will Perform Grooming: with min assist;bed level Additional ADL Goal #1: Pt will attend to ADL task in nondistracting environemtn with mod redirectional cues Additional ADL Goal #2: Pt will tolerate L prevalon boot to reduce pressure L heel  OT Frequency: Min 2X/week   Barriers to D/C:            Co-evaluation              End of Session Nurse Communication: Mobility status  Activity Tolerance: Patient tolerated treatment well Patient left: in bed;with call bell/phone within reach;with bed alarm set   Time: 1555-1619 OT Time Calculation (min): 24 min Charges:  OT Evaluation $OT Eval Moderate Complexity: 1 Procedure OT Treatments $Self Care/Home Management : 8-22 mins G-Codes:    Mekiah Wahler,HILLARY 08-12-16, 4:51 PM   [facility], OT/L  [phone] 08/12/16

## 2016-08-08 NOTE — Progress Notes (Signed)
PROGRESS NOTE    Angela Keith  LOV:[id number] DOB: [birth year] DOA: 08/06/2016 PCP: [name], MD    Brief Narrative:  83 y.o. 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 Last seen normal around 5:30 yesterday evening. Caregiver at the home noted this morning patient has had 2-3 episodes of dysarthria associated right facial drooping. In addition, patient was attempting to use the restroom and upon standing to try to pull up her undergarments she slid to the floor. She denied dizziness. She has had recent issues with worsening spasticity of the left lower extremity over the past several days. EMS was called to the home and by the time they arrived the symptoms had resolved. Upon patient to the ER symptoms had recurred. Code stroke was initiated. Neurology has seen the patient and deemed her not appropriate for TPA based on severity of symptoms and concurrent utilization of eliquis anticoagulation. Imaging consistent with likely left MCA territory CVA  Assessment & Plan:   Principal Problem:   Acute ischemic left MCA stroke (HCC) Active Problems:   HTN (hypertension)   Hypothyroidism   Hyperlipidemia LDL goal <70   Spinal stenosis, lumbar region, with neurogenic claudication   H/O TIA (transient ischemic attack) and stroke   Left hemiparesis (HCC)   Atrial fibrillation (HCC)   GERD (gastroesophageal reflux disease)   Insomnia   Acute urinary retention   Acute ischemic left MCA stroke -Presents with right facial droop and dysarthria that has been waxing and waning -Pt is now s/p MRI brain with findings of small acute/early subacute infarct within the L posterior limb of the internal capsule -Echocardiogram done, pending  results -Underwent CTA head and neck so no indication for carotid duplex -Continue aspirin for antiplatelet -Lipid panel/hemoglobin A1c for risk stratification -PT/OT/SLP consulted -SLP recs for dysphagia 2 with thin liquids - Pt noted to be more lethargic this AM and difficult to arouse. Discussed with Neurology who recommended STAT CT angio head/neck. Results reviewed. -Discussed with Neurology, concerns for possible polypharmacy the night prior. Will reduce sedating medications  H/O TIA (transient ischemic attack) and embolic stroke/Left hemiparesis  -See above -Immediate post stroke in 2016 patient had issues with significant left lower extremity spasticity which began recurring 2 days ago primarily at night -Pt was continued on preadmission medications: Neurontin, baclofen, Lidoderm patch to left thigh -given concerns of polypharmacy, will reduce sedating medications    Acute urinary retention -Multiple episodes overnight of bladder outlet obstruction -Family member at bedside reports patient had similar issues during previous stroke July 2016 -Renal US unremarkable. Suspect neurogenic bladder related to prior CVA -continue indwelling foley cath, recommend outpatient Urology follow up    HTN (hypertension) -Moderate control -Allowing for permissive hypertension -For now, will continue preadmission Lopressor -On the Flomax for issues related to intermittent urinary retention -will continue hydralazine IV prn ordered for SBP >220 or DBP > 120    Hypothyroidism -Continue Synthroid -Remains stable at present    Atrial fibrillation  -Currently maintaining sinus rhythm with first-degree AV block and PACs -Continue beta blocker -CHADVASc=6 -will continue on eliquis with added aspirin per Neurology recs    Hyperlipidemia LDL goal <70 -Not on statin prior to admission (reported allergy) -Currently stable at present    Spinal stenosis, lumbar region, with neurogenic  claudication -Continue preadmission Neurontin, baclofen and Norco -  Remains stable at present    GERD (gastroesophageal reflux disease) -Continue PPI -Currently stable    Insomnia -Was on prn Elavil at home -Presently stable  DVT prophylaxis: eliquis Code Status: Full Family Communication: Pt in room, granddaughter at bedside Disposition Plan: Uncertain at this time  Consultants:   Neurology  Procedures:     Antimicrobials: Anti-infectives    None      Subjective: Without complaints currently  Objective: Vitals:   08/08/16 0138 08/08/16 0545 08/08/16 0921 08/08/16 1420  BP: (!) 166/83 (!) 146/77 (!) 194/78 (!) 169/85  Pulse: 78 72 80 65  Resp: 20 20 20 20   Temp: 98.5 F (36.9 C) 98.1 F (36.7 C) 97.5 F (36.4 C) 97.6 F (36.4 C)  TempSrc: Oral Oral Oral Oral  SpO2: 95% 97% 95% 100%  Weight:      Height:        Intake/Output Summary (Last 24 hours) at 08/08/16 1551 Last data filed at 08/08/16 0547  Gross per 24 hour  Intake              120 ml  Output              950 ml  Net             -830 ml   Filed Weights   08/06/16 2032  Weight: 64.3 kg (141 lb 12.8 oz)    Examination:  General exam: Laying in bed, difficult to arouse  Respiratory system: normal resp effort, no wheezing Cardiovascular system: regular rate, s1, s2 Gastrointestinal system: soft, nondistended, pos BS. Central nervous system: no seizures, no tremors Extremities: no clubbing, no cyanosis Skin: Normal skin turgor, no notable skin lesions seen Psychiatry: unable to assess given change in mental status this AM   Data Reviewed: I have personally reviewed following labs and imaging studies  CBC:  Recent Labs Lab 08/06/16 0759 08/06/16 0812  WBC 6.6  --   NEUTROABS 3.3  --   HGB 12.8 14.3  HCT 40.3 42.0  MCV 90.0  --   PLT 246  --    Basic Metabolic Panel:  Recent Labs Lab 08/06/16 0759 08/06/16 0812 08/08/16 1216  NA 142 145 138  K 4.0 3.9 4.2  CL 107 103  102  CO2 31  --  25  GLUCOSE 105* 103* 95  BUN 15 19 14   CREATININE 0.93 1.00 0.78  CALCIUM 9.7  --  9.2   GFR: Estimated Creatinine Clearance (by C-G formula based on SCr of 0.78 mg/dL) Female: 16.1 mL/min Female: 57.6 mL/min Liver Function Tests:  Recent Labs Lab 08/06/16 0759  AST 22  ALT 18  ALKPHOS 65  BILITOT 0.3  PROT 7.5  ALBUMIN 3.9   No results for input(s): LIPASE, AMYLASE in the last 168 hours. No results for input(s): AMMONIA in the last 168 hours. Coagulation Profile:  Recent Labs Lab 08/06/16 0759  INR 0.95   Cardiac Enzymes: No results for input(s): CKTOTAL, CKMB, CKMBINDEX, TROPONINI in the last 168 hours. BNP (last 3 results) No results for input(s): PROBNP in the last 8760 hours. HbA1C:  Recent Labs  08/07/16 0715  HGBA1C 5.8*   CBG: No results for input(s): GLUCAP in the last 168 hours. Lipid Profile:  Recent Labs  08/07/16 0715  CHOL 180  HDL 54  LDLCALC 118*  TRIG 40  CHOLHDL 3.3   Thyroid Function Tests: No results for input(s): TSH, T4TOTAL, FREET4, T3FREE, THYROIDAB in the last 72 hours. Anemia Panel: No  results for input(s): VITAMINB12, FOLATE, FERRITIN, TIBC, IRON, RETICCTPCT in the last 72 hours. Sepsis Labs: No results for input(s): PROCALCITON, LATICACIDVEN in the last 168 hours.  No results found for this or any previous visit (from the past 240 hour(s)).   Radiology Studies: Ct Angio Head W Or Wo Contrast  Result Date: 08/08/2016 CLINICAL DATA:  83 year old female with intracranial atherosclerosis including chronic distal right ACA occlusion. Recent MRI suggesting acute or subacute ischemia of the left internal capsule, while recent CTA was negative for emergent large vessel occlusion. Worsening mental status, now completely unresponsive. Initial encounter. EXAM: CT ANGIOGRAPHY HEAD AND NECK TECHNIQUE: Multidetector CT imaging of the head and neck was performed using the standard protocol during bolus administration of  intravenous contrast. Multiplanar CT image reconstructions and MIPs were obtained to evaluate the vascular anatomy. Carotid stenosis measurements (when applicable) are obtained utilizing NASCET criteria, using the distal internal carotid diameter as the denominator. CONTRAST:  50 mL Isovue 370 COMPARISON:  CTA head and neck 08/06/2016. Brain MRI 08/06/2016, and earlier. FINDINGS: CT HEAD Brain: Mild if any interval hypodensity at the left lateral thalamus and internal capsule corresponding to the MRA finding. Stable gray-white matter differentiation elsewhere. Chronic right ACA territory encephalomalacia. No acute intracranial hemorrhage identified. No midline shift, mass effect, or evidence of intracranial mass lesion. No ventriculomegaly. Calvarium and skull base: Stable. No acute osseous abnormality identified. Paranasal sinuses: Visualized paranasal sinuses and mastoids are stable and well pneumatized. Orbits: Stable orbit and scalp soft tissues. CTA NECK Skeleton: Degenerative changes in the spine. Osteopenia. No acute osseous abnormality identified. Upper chest: Stable lung apices. No superior mediastinal lymphadenopathy. Small calcified hilar and mediastinal lymph nodes compatible with prior granulomatous disease. Other neck: Stable and negative; diminutive or absent thyroid. Aortic arch: Stable 3 vessel arch configuration and calcified arch atherosclerosis. Right carotid system: Stable tortuosity with a kinked appearance of the proximal right CCA. Unchanged right carotid bifurcation and cervical right ICA. Left carotid system: Stable left CCA tortuosity. Unchanged left carotid bifurcation and cervical left ICA. Vertebral arteries:Stable tortuosity and calcified plaque in the proximal right subclavian artery with a kinked appearance. Stable cervical right vertebral artery, negative except for tortuosity. Stable proximal left subclavian calcified plaque without stenosis. Stable cervical left vertebral artery,  negative except for tortuosity. CTA HEAD Posterior circulation: Stable distal vertebral artery is without stenosis. Both PICA origins remain patent. Patent vertebrobasilar junction. Stable basilar artery without stenosis. Unchanged PCA origins, moderate right P1 and P 2 irregularity and stenosis and moderate left P3 segment irregularity and stenosis. Posterior communicating arteries are diminutive or absent. Anterior circulation: Both ICA siphons remain patent and are stable with calcified plaque. Carotid termini are stable and normal. Stable bilateral ACA is including chronic occlusion of the distal right ACA, and moderate left ACA 8 pericallosal artery irregularity and stenosis. Moderate to severe left MCA M1 and mild to moderate right M1 irregularity and stenosis is stable. Both MCA bifurcations remain patent. Bilateral MCA branches are stable including right greater than left branch irregularity and stenosis. Venous sinuses: Patent. Anatomic variants: None. Delayed phase: No abnormal enhancement identified. Review of the MIP images confirms the above findings IMPRESSION: 1. Negative for emergent large vessel occlusion and Unchanged CTA head and neck findings from 2 days ago. 2. Arterial tortuosity in the neck with mild extracranial atherosclerosis and no extracranial stenosis. 3. Widespread intracranial atherosclerosis with chronic distal right ACA occlusion and moderate stenoses of the left ACA, bilateral MCAs (left greater than right M1n and  right greater than left M2), and bilateral PCAs (right P1 and P2, and left P3). 4. Stable CT appearance of the brain since 08/06/2016. Electronically Signed   By: Odessa Fleming M.D.   On: 08/08/2016 12:26   Ct Angio Neck W Or Wo Contrast  Result Date: 08/08/2016 CLINICAL DATA:  83 year old female with intracranial atherosclerosis including chronic distal right ACA occlusion. Recent MRI suggesting acute or subacute ischemia of the left internal capsule, while recent CTA was  negative for emergent large vessel occlusion. Worsening mental status, now completely unresponsive. Initial encounter. EXAM: CT ANGIOGRAPHY HEAD AND NECK TECHNIQUE: Multidetector CT imaging of the head and neck was performed using the standard protocol during bolus administration of intravenous contrast. Multiplanar CT image reconstructions and MIPs were obtained to evaluate the vascular anatomy. Carotid stenosis measurements (when applicable) are obtained utilizing NASCET criteria, using the distal internal carotid diameter as the denominator. CONTRAST:  50 mL Isovue 370 COMPARISON:  CTA head and neck 08/06/2016. Brain MRI 08/06/2016, and earlier. FINDINGS: CT HEAD Brain: Mild if any interval hypodensity at the left lateral thalamus and internal capsule corresponding to the MRA finding. Stable gray-white matter differentiation elsewhere. Chronic right ACA territory encephalomalacia. No acute intracranial hemorrhage identified. No midline shift, mass effect, or evidence of intracranial mass lesion. No ventriculomegaly. Calvarium and skull base: Stable. No acute osseous abnormality identified. Paranasal sinuses: Visualized paranasal sinuses and mastoids are stable and well pneumatized. Orbits: Stable orbit and scalp soft tissues. CTA NECK Skeleton: Degenerative changes in the spine. Osteopenia. No acute osseous abnormality identified. Upper chest: Stable lung apices. No superior mediastinal lymphadenopathy. Small calcified hilar and mediastinal lymph nodes compatible with prior granulomatous disease. Other neck: Stable and negative; diminutive or absent thyroid. Aortic arch: Stable 3 vessel arch configuration and calcified arch atherosclerosis. Right carotid system: Stable tortuosity with a kinked appearance of the proximal right CCA. Unchanged right carotid bifurcation and cervical right ICA. Left carotid system: Stable left CCA tortuosity. Unchanged left carotid bifurcation and cervical left ICA. Vertebral  arteries:Stable tortuosity and calcified plaque in the proximal right subclavian artery with a kinked appearance. Stable cervical right vertebral artery, negative except for tortuosity. Stable proximal left subclavian calcified plaque without stenosis. Stable cervical left vertebral artery, negative except for tortuosity. CTA HEAD Posterior circulation: Stable distal vertebral artery is without stenosis. Both PICA origins remain patent. Patent vertebrobasilar junction. Stable basilar artery without stenosis. Unchanged PCA origins, moderate right P1 and P 2 irregularity and stenosis and moderate left P3 segment irregularity and stenosis. Posterior communicating arteries are diminutive or absent. Anterior circulation: Both ICA siphons remain patent and are stable with calcified plaque. Carotid termini are stable and normal. Stable bilateral ACA is including chronic occlusion of the distal right ACA, and moderate left ACA 8 pericallosal artery irregularity and stenosis. Moderate to severe left MCA M1 and mild to moderate right M1 irregularity and stenosis is stable. Both MCA bifurcations remain patent. Bilateral MCA branches are stable including right greater than left branch irregularity and stenosis. Venous sinuses: Patent. Anatomic variants: None. Delayed phase: No abnormal enhancement identified. Review of the MIP images confirms the above findings IMPRESSION: 1. Negative for emergent large vessel occlusion and Unchanged CTA head and neck findings from 2 days ago. 2. Arterial tortuosity in the neck with mild extracranial atherosclerosis and no extracranial stenosis. 3. Widespread intracranial atherosclerosis with chronic distal right ACA occlusion and moderate stenoses of the left ACA, bilateral MCAs (left greater than right M1n and right greater than left M2), and  bilateral PCAs (right P1 and P2, and left P3). 4. Stable CT appearance of the brain since 08/06/2016. Electronically Signed   By: Odessa Fleming M.D.   On:  08/08/2016 12:26   US Renal  Result Date: 08/08/2016 CLINICAL DATA:  Bladder outlet obstruction. EXAM: RENAL / URINARY TRACT ULTRASOUND COMPLETE COMPARISON:  CT 10/30/2012 FINDINGS: Right Kidney: Length: 9.2 cm. Echogenicity within normal limits. No mass or hydronephrosis visualized. Left Kidney: Technically limited evaluation. Length: 9.5 cm. Echogenicity within normal limits. No mass or hydronephrosis visualized. Bladder: Decompressed by Foley catheter and not evaluated. IMPRESSION: No hydronephrosis. Bladder decompressed by Foley catheter and not evaluated. Electronically Signed   By: Rubye Oaks M.D.   On: 08/08/2016 01:04    Scheduled Meds: . apixaban  5 mg Oral BID  . aspirin EC  81 mg Oral Daily  . baclofen  10 mg Oral TID  . dorzolamide-timolol  1 drop Both Eyes BID  . gabapentin  100 mg Oral BID WC  . gabapentin  600 mg Oral QHS  . latanoprost  1 drop Both Eyes QHS  . levothyroxine  88 mcg Oral QAC breakfast  . lidocaine  1 patch Transdermal Q24H  . metoprolol tartrate  25 mg Oral BID  . mirtazapine  15 mg Oral QHS  . pantoprazole  40 mg Oral BID  . pravastatin  20 mg Oral q1800  . tamsulosin  0.4 mg Oral Daily   Continuous Infusions: . sodium chloride       LOS: 2 days   Aqib Lough, Scheryl Marten, MD [facility] Pager [phone]  If 7PM-7AM, please contact night-coverage [email or web address] Password [email or web address] 08/08/2016, 3:51 PM

## 2016-08-08 NOTE — Progress Notes (Signed)
Pt continues to be difficult to arouse therefore held PO meds this evening.

## 2016-08-08 NOTE — Progress Notes (Signed)
STROKE TEAM PROGRESS NOTE   SUBJECTIVE (INTERVAL HISTORY) Daughter and granddaughter are at bedside. Pt was still not arousable at early afternoon. CTA head and neck done again but not change from prior. On return from CT, pt was gradually woke up with repeated stimulation. Found that she received 30mg  baclofen, 15mg  remeron and 100mg  gabapentin last night. Of note, pt was not able to tolerate 20mg  baclofen at night in NH due to somnolence the second day.    OBJECTIVE Temp:  [97.5 F (36.4 C)-98.7 F (37.1 C)] 97.5 F (36.4 C) (02/01 1726) Pulse Rate:  [62-95] 62 (02/01 1726) Cardiac Rhythm: Normal sinus rhythm;Bundle branch block (02/01 1900) Resp:  [20] 20 (02/01 1726) BP: (146-194)/(73-85) 164/73 (02/01 1726) SpO2:  [95 %-100 %] 100 % (02/01 1726)  CBC:   Recent Labs Lab 08/06/16 0759 08/06/16 0812  WBC 6.6  --   NEUTROABS 3.3  --   HGB 12.8 14.3  HCT 40.3 42.0  MCV 90.0  --   PLT 246  --     Basic Metabolic Panel:   Recent Labs Lab 08/06/16 0759 08/06/16 0812 08/08/16 1216  NA 142 145 138  K 4.0 3.9 4.2  CL 107 103 102  CO2 31  --  25  GLUCOSE 105* 103* 95  BUN 15 19 14   CREATININE 0.93 1.00 0.78  CALCIUM 9.7  --  9.2    Lipid Panel:     Component Value Date/Time   CHOL 180 08/07/2016 0715   TRIG 40 08/07/2016 0715   HDL 54 08/07/2016 0715   CHOLHDL 3.3 08/07/2016 0715   VLDL 8 08/07/2016 0715   LDLCALC 118 (H) 08/07/2016 0715   HgbA1c:  Lab Results  Component Value Date   HGBA1C 5.8 (H) 08/07/2016   Urine Drug Screen:     Component Value Date/Time   LABOPIA NONE DETECTED 08/06/2016 1018   COCAINSCRNUR NONE DETECTED 08/06/2016 1018   LABBENZ NONE DETECTED 08/06/2016 1018   AMPHETMU NONE DETECTED 08/06/2016 1018   THCU NONE DETECTED 08/06/2016 1018   LABBARB NONE DETECTED 08/06/2016 1018      IMAGING I have personally reviewed the radiological images below and agree with the radiology interpretations.  Ct Head Code Stroke W/o  Cm 08/06/2016 1. Suspicious hyperdensity at the left ICA terminus and suspect subtle loss of gray-white differentiation in the posterior left MCA territory. No acute hemorrhage or intracranial mass effect. 2. ASPECTS is 9. 3. Chronic posterior right ACA infarct.   Ct Cerebral Perfusion W Contrast 08/06/2016 1. CT Perfusion is negative for evidence of core infarct and CTA is negative for emergent large vessel occlusion.   Ct Angio Head W Or Wo Contrast Ct Angio Neck W Or Wo Contrast 08/06/2016 2. Generalized arterial tortuosity in the neck with mild atherosclerosis and no stenosis. 3. More moderate to severe chronic intracranial atherosclerosis and stenosis with chronic occlusion of the distal right ACA. Significant proximal stenoses which appear stable to the 2016 intracranial MRA include - Moderate bilateral MCA M1 segment stenoses, and - Moderate Right P1 and P2 segments stenoses. 4. Up to moderate distal branch irregularity and stenosis in the left PCA, left ACA, and right greater than left MCA territories.   Mr Brain 14 Contrast Mr Maxine Glenn Head Wo Contrast 08/06/2016 1. Small acute/ early subacute infarct within the left posterior limb of internal capsule. No acute hemorrhage identified. 2. Stable chronic distal right ACA and left lentiform nuclei infarcts. 3. Stable moderate chronic microvascular ischemic changes and parenchymal volume  loss of the brain. 4. Mild paranasal sinus disease. 5. Motion degraded time-of-flight MRA. No gross interval change from prior CT angiogram. No acute proximal large vessel occlusion.   2-D echocardiogram Left ventricle: The cavity size was normal. Wall thickness was   increased in a pattern of mild LVH. Systolic function was normal.   The estimated ejection fraction was in the range of 55% to 60%.   Wall motion was normal; there were no regional wall motion   abnormalities. Features are consistent with a pseudonormal left   ventricular filling pattern, with  concomitant abnormal relaxation   and increased filling pressure (grade 2 diastolic dysfunction).  Ct Angio Head and neck W Or Wo Contrast 08/08/2016 IMPRESSION: 1. Negative for emergent large vessel occlusion and Unchanged CTA head and neck findings from 2 days ago. 2. Arterial tortuosity in the neck with mild extracranial atherosclerosis and no extracranial stenosis. 3. Widespread intracranial atherosclerosis with chronic distal right ACA occlusion and moderate stenoses of the left ACA, bilateral MCAs (left greater than right M1n and right greater than left M2), and bilateral PCAs (right P1 and P2, and left P3). 4. Stable CT appearance of the brain since 08/06/2016.    US Renal 08/08/2016 IMPRESSION: No hydronephrosis. Bladder decompressed by Foley catheter and not evaluated.    PHYSICAL EXAM  Temp:  [97.5 F (36.4 C)-98.7 F (37.1 C)] 97.5 F (36.4 C) (02/01 1726) Pulse Rate:  [62-95] 62 (02/01 1726) Resp:  [20] 20 (02/01 1726) BP: (146-194)/(73-85) 164/73 (02/01 1726) SpO2:  [95 %-100 %] 100 % (02/01 1726)  General - Well nourished, well developed, in no apparent distress.  Ophthalmologic - Fundi not visualized due to eye movement.  Cardiovascular - Regular rate and rhythm.  Mental Status -  Level of arousal and orientation to time, place, and person were intact. Language including expression, naming, repetition, comprehension was assessed and found intact, but moderate dysarthria  Cranial Nerves II - XII - II - Visual field intact OU. III, IV, VI - Extraocular movements intact. V - Facial sensation intact bilaterally. VII - right facial droop. VIII - Hearing & vestibular intact bilaterally. X - Palate elevates symmetrically, but moderate dysarthria. XI - Chin turning & shoulder shrug intact bilaterally. XII - Tongue protrusion intact.  Motor Strength - The patient's strength was normal in all extremities except LLE 4-/5 residue from previous stroke and pronator drift was  absent.  Bulk was normal and fasciculations were absent.   Motor Tone - Muscle tone was assessed at the neck and appendages and was normal.  Reflexes - The patient's reflexes were 1+ in all extremities and she had no pathological reflexes.  Sensory - Light touch, temperature/pinprick were assessed and were symmetrical.    Coordination - The patient had normal movements in the hands with no ataxia or dysmetria.  Tremor was absent.  Gait and Station - deferred.   ASSESSMENT/PLAN Angela Keith is a 83 y.o. female with history of CVA in 2016 with left-sided deficits that are persistent and associated with left lower extremity spasticity, hypertension, hypothyroidism, atrial fibrillation on eliquis, spinal stenosis with neurogenic claudication, dyslipidemia, GERD and insomnia presenting with garbled speech. She did not receive IV t-PA due to delay in arrival.   Stroke:  Left PLIC/CR infarct likely small vessel disease. However, pt does have pAfib on eliquis, embolic stroke can not be excluded.  Resultant  Right facial droop and dysarthria  Code stroke CT suspicious hyperdensity left ICA terminus. Aspects 9  CT perfusion negative for core infarct   CTA head and neck negative for emergent large vessel occlusion. Chronic occlusion distal R ACA. Moderate bilateral M1 stenosis. Moderate R P1 and P2 stenosis.  MRI  left PLIC/CR infarct. Old right ACA and left lentiform nuclei infarcts.   MRA  No large vessel occlusion. No gross interval change from prior CT angiogram, but diffuse atherosclerosis  2D Echo EF 55-60%   LDL 118  HgbA1c 5.8  Eliquis for VTE prophylaxis DIET [name] Room service appropriate? Yes; Fluid consistency: Thin  Eliquis (apixaban) daily prior to admission, now on aspirin 325 mg daily and Eliquis (apixaban) daily. Recommend ASA 81mg  along with eliquis 5mg  bid for further stroke prevention.   Patient counseled to be compliant with her antithrombotic  medications  Ongoing aggressive stroke risk factor management  Therapy recommendations:  SNF  Disposition:  Pending  Atrial Fibrillation  Home anticoagulation:  Eliquis (apixaban) daily continued in the hospital  Found in 04/2015 after loop recorder placement  Continue eliquis 5mg  bid at discharge  Somnolence   Today's difficulty aurosable event is likely due to over medication and polypharmacy  D/c baclofen 20mg  QHs  Avoid polypharmacy  Hx stroke/TIA  01/20/15 - right ACA and punctate left thalamic infarcts, embolic secondary to unknown source. Loop recorder placed and found to have paroxysmal atrial fibrillation - on eliquis - with left sided residue  09/05/14 - presumed left brain stroke with right facial weakness and dysarthria status post IV TPA, MRI negative - DAPT with pravastatin  [facility]    Hypertension  Stable  On metoprolol  Permissive hypertension (OK if < 220/120) but gradually normalize in 5-7 days  Long-term BP goal normotensive  Hyperlipidemia  Home meds:  No statin  LDL 118, goal < 70  Intolerance with lipitor, was on pravastatin in the past  Add pravastatin 20mg   Continue statin at discharge  Other Stroke Risk Factors  Advanced age  Family hx stroke (father)  Other Active Problems  Acute urinary retention  Hypothyroidism, on Synthroid  Spinal stenosis, lumbar region, with neurologic claudication  GERD  Insomnia, [facility]  Hospital day # 2  Neurology will sign off. Please call with questions. Pt [facility]. Thanks for the consult.  Angela PlanJindong Lurlie Wigen, MD PhD Stroke Neurology 08/08/2016 9:43 PM    To contact Stroke Continuity provider, please refer to [email or web address]. After hours, contact General Neurology

## 2016-08-08 NOTE — Progress Notes (Signed)
SLP Cancellation Note  Patient Details Name: Angela PavlovRachel E Keith MRN: [id number] DOB: Apr 01, 1930   Cancelled treatment:       Reason Eval/Treat Not Completed: Fatigue/lethargy limiting ability to participate. Pt more lethargic today per RN, not able to be sufficiently aroused. Recommend to hold all POs until alertness improves.   [name], Callista Hoh 08/08/2016, 4:36 PM  [name], M.A. CCC-SLP [phone]

## 2016-08-09 ENCOUNTER — Encounter: Payer: Medicare Other | Admitting: Internal Medicine

## 2016-08-09 DIAGNOSIS — N320 Bladder-neck obstruction: Secondary | ICD-10-CM | POA: Diagnosis not present

## 2016-08-09 DIAGNOSIS — B952 Enterococcus as the cause of diseases classified elsewhere: Secondary | ICD-10-CM | POA: Diagnosis not present

## 2016-08-09 DIAGNOSIS — Z9181 History of falling: Secondary | ICD-10-CM | POA: Diagnosis not present

## 2016-08-09 DIAGNOSIS — I639 Cerebral infarction, unspecified: Secondary | ICD-10-CM | POA: Diagnosis not present

## 2016-08-09 DIAGNOSIS — N390 Urinary tract infection, site not specified: Secondary | ICD-10-CM | POA: Diagnosis not present

## 2016-08-09 DIAGNOSIS — E034 Atrophy of thyroid (acquired): Secondary | ICD-10-CM | POA: Diagnosis not present

## 2016-08-09 DIAGNOSIS — I69322 Dysarthria following cerebral infarction: Secondary | ICD-10-CM | POA: Diagnosis not present

## 2016-08-09 DIAGNOSIS — Z8673 Personal history of transient ischemic attack (TIA), and cerebral infarction without residual deficits: Secondary | ICD-10-CM | POA: Diagnosis not present

## 2016-08-09 DIAGNOSIS — I69320 Aphasia following cerebral infarction: Secondary | ICD-10-CM | POA: Diagnosis not present

## 2016-08-09 DIAGNOSIS — F05 Delirium due to known physiological condition: Secondary | ICD-10-CM | POA: Diagnosis not present

## 2016-08-09 DIAGNOSIS — F329 Major depressive disorder, single episode, unspecified: Secondary | ICD-10-CM | POA: Diagnosis not present

## 2016-08-09 DIAGNOSIS — I6789 Other cerebrovascular disease: Secondary | ICD-10-CM | POA: Diagnosis not present

## 2016-08-09 DIAGNOSIS — I480 Paroxysmal atrial fibrillation: Secondary | ICD-10-CM | POA: Diagnosis not present

## 2016-08-09 DIAGNOSIS — R4182 Altered mental status, unspecified: Secondary | ICD-10-CM | POA: Diagnosis not present

## 2016-08-09 DIAGNOSIS — I4891 Unspecified atrial fibrillation: Secondary | ICD-10-CM | POA: Diagnosis not present

## 2016-08-09 DIAGNOSIS — R339 Retention of urine, unspecified: Secondary | ICD-10-CM | POA: Diagnosis not present

## 2016-08-09 DIAGNOSIS — E785 Hyperlipidemia, unspecified: Secondary | ICD-10-CM | POA: Diagnosis not present

## 2016-08-09 DIAGNOSIS — I160 Hypertensive urgency: Secondary | ICD-10-CM | POA: Diagnosis not present

## 2016-08-09 DIAGNOSIS — R278 Other lack of coordination: Secondary | ICD-10-CM | POA: Diagnosis not present

## 2016-08-09 DIAGNOSIS — I69354 Hemiplegia and hemiparesis following cerebral infarction affecting left non-dominant side: Secondary | ICD-10-CM | POA: Diagnosis not present

## 2016-08-09 DIAGNOSIS — I69351 Hemiplegia and hemiparesis following cerebral infarction affecting right dominant side: Secondary | ICD-10-CM | POA: Diagnosis not present

## 2016-08-09 DIAGNOSIS — G8194 Hemiplegia, unspecified affecting left nondominant side: Secondary | ICD-10-CM | POA: Diagnosis not present

## 2016-08-09 DIAGNOSIS — I69391 Dysphagia following cerebral infarction: Secondary | ICD-10-CM | POA: Diagnosis not present

## 2016-08-09 DIAGNOSIS — I63512 Cerebral infarction due to unspecified occlusion or stenosis of left middle cerebral artery: Secondary | ICD-10-CM | POA: Diagnosis not present

## 2016-08-09 DIAGNOSIS — G8114 Spastic hemiplegia affecting left nondominant side: Secondary | ICD-10-CM | POA: Diagnosis not present

## 2016-08-09 DIAGNOSIS — I10 Essential (primary) hypertension: Secondary | ICD-10-CM | POA: Diagnosis not present

## 2016-08-09 DIAGNOSIS — R338 Other retention of urine: Secondary | ICD-10-CM | POA: Diagnosis not present

## 2016-08-09 DIAGNOSIS — M6281 Muscle weakness (generalized): Secondary | ICD-10-CM | POA: Diagnosis not present

## 2016-08-09 DIAGNOSIS — M48062 Spinal stenosis, lumbar region with neurogenic claudication: Secondary | ICD-10-CM | POA: Diagnosis not present

## 2016-08-09 MED ORDER — HYDROCODONE-ACETAMINOPHEN 5-325 MG PO TABS: 1.0000 | ORAL_TABLET | Freq: Two times a day (BID) | ORAL | 0 refills | Status: DC | PRN

## 2016-08-09 MED ORDER — ENSURE ENLIVE PO LIQD
237.0000 mL | Freq: Three times a day (TID) | ORAL | Status: DC
  Administered 2016-08-09: 237 mL via ORAL
  Filled 2016-08-09 (×4): qty 237

## 2016-08-09 MED ORDER — PRAVASTATIN SODIUM 20 MG PO TABS: 20.0000 mg | ORAL_TABLET | Freq: Every day | ORAL | 0 refills | Status: DC

## 2016-08-09 MED ORDER — BACLOFEN 10 MG PO TABS: 5.0000 mg | ORAL_TABLET | Freq: Three times a day (TID) | ORAL | 0 refills | Status: DC

## 2016-08-09 MED ORDER — ASPIRIN 81 MG PO TBEC: 81.0000 mg | DELAYED_RELEASE_TABLET | Freq: Every day | ORAL | 0 refills | Status: DC

## 2016-08-09 NOTE — Progress Notes (Signed)
Pt is opening eyes and attempting to respond see is doing better but is still drowsy.

## 2016-08-09 NOTE — Care Management Note (Signed)
Case Management Note  Patient Details  Name: Angela Keith MRN: [id number] Date of Birth: Jul 03, 1930  Subjective/Objective:                    Action/Plan: [facility]. No further needs per CM.  Expected Discharge Date:  08/09/16               Expected Discharge Plan:  Skilled Nursing Facility  In-House Referral:     Discharge planning Services     Post Acute Care Choice:    Choice offered to:     DME Arranged:    DME Agency:     HH Arranged:    HH Agency:     Status of Service:  Completed, signed off  If discussed at [employer] Meetings, dates discussed:    Additional Comments:  [name], RN 08/09/2016, 1:32 PM

## 2016-08-09 NOTE — Discharge Summary (Addendum)
Physician Discharge Summary  Angela Keith:[id number] DOB: 1929/10/31 DOA: 08/06/2016  PCP: [name], MD  Admit date: 08/06/2016 Discharge date: 08/09/2016  Admitted From: Home Disposition:  SNF  Recommendations for Outpatient Follow-up:  1. Follow up with PCP in 2-3 weeks 2. Follow up with Neurology as scheduled 3. Consider follow up with Dr. Logan Bores (Urologist) for assistance with bladder training 4. Continue with routine foley cath care  Discharge Condition:Stable CODE STATUS:Full Diet recommendation: Dysphagia 2 with thin liquids   Brief/Interim Summary: 83 y.o.femalewith medical history significant for CVA in 2016 with left-sided deficits that are persistent and associated with left lower extremity spasticity, hypertension, hypothyroidism, atrial fibrillation on eliquis, spinal stenosis with neurogenic claudication, dyslipidemia, GERD and insomnia. Last seen normal around 5:30 yesterday evening. Caregiver at the home noted this morning patient has had 2-3 episodes of dysarthria associated right facial drooping. In addition, patient was attempting to use the restroom and upon standing to try to pull up her undergarments she slid to the floor. She denied dizziness. She has had recent issues with worsening spasticity of the left lower extremity over the past several days. EMS was called to the home and by the time they arrived the symptoms had resolved. Upon patient to the ER symptoms had recurred. Code stroke was initiated. Neurology has seen the patient and deemed her not appropriate for TPA based on severity of symptoms and concurrent utilization of eliquis anticoagulation. Imaging consistent with likely left MCA territory CVA  Acute ischemic left MCA stroke -Presents with right facial droop and dysarthria that has been waxing and waning -Pt is now s/p MRI brain with findings of small acute/early subacute infarct within the L posterior limb of the internal  capsule -Echocardiogram done, pending results -Underwent CTA head and neck so no indication for carotid duplex -Continue aspirin for antiplatelet -Lipid panel/hemoglobin A1c for risk stratification -PT/OT/SLP consulted -SLP recs for dysphagia 2 with thin liquids - Pt noted to be more lethargic this AM and difficult to arouse. Discussed with Neurology who recommended STAT CT angio head/neck. Results reviewed. -Discussed with Neurology, concerns for possible polypharmacy the night prior. -Clinically improved after markedly decreasing sedating medications. Recommend avoiding sedating medications if possible. If needed, give/titrate cautiously  H/O TIA (transient ischemic attack) and embolic stroke/Left hemiparesis  -See above -Immediate post stroke in 2016 patient had issues with significant left lower extremity spasticity which began recurring 2 days ago primarily at night -Pt was continued on preadmission medications: Neurontin, baclofen, Lidoderm patch to left thigh -given concerns of polypharmacy, reduced sedating medications  Acute urinary retention -Multiple episodes overnight of bladder outlet obstruction -Family member at bedside reports patient had similar issues during previous stroke July 2016 -Renal US unremarkable. Suspect neurogenic bladder related to prior CVA -continue indwelling foley cath, recommend outpatient Urology follow up (Dr. Logan Bores)  HTN (hypertension) -Moderate control -Allowing for permissive hypertension -For now, will continue preadmission Lopressor -On the Flomax for issues related to intermittent urinary retention  Hypothyroidism -Continue Synthroid -Remains stable at present  Atrial fibrillation  -Currently maintaining sinus rhythm with first-degree AV block and PACs -Continue beta blocker -CHADVASc=6 -will continue on eliquis with added aspirin per Neurology recs  Hyperlipidemia LDL goal <70 -Not on statin prior to admission  (reported allergy) -Remained stable at present  Spinal stenosis, lumbar region, with neurogenic claudication -Continue preadmission Neurontin, baclofen and Norco -Remains stable at present  GERD (gastroesophageal reflux disease) -Continue PPI -Currently stable  Insomnia -Was on prnElavil at home -Presently stable  Discharge Diagnoses:  Principal Problem:   Acute ischemic left MCA stroke (HCC) Active Problems:   HTN (hypertension)   Hypothyroidism   Hyperlipidemia LDL goal <70   Spinal stenosis, lumbar region, with neurogenic claudication   H/O TIA (transient ischemic attack) and stroke   Left hemiparesis (HCC)   Atrial fibrillation (HCC)   GERD (gastroesophageal reflux disease)   Insomnia   Acute urinary retention   Somnolence, daytime    Discharge Instructions  Discharge Instructions    Ambulatory referral to Neurology    Complete by:  As directed    Follow up with Dr. [name] at [facility] in about 2 months. Thanks     Allergies as of 08/09/2016      Reactions   Ace Inhibitors Other (See Comments)   Severe fatigue   Lipitor [atorvastatin] Other (See Comments)   Mood swings   Motrin [ibuprofen] Nausea And Vomiting      Medication List    STOP taking these medications   amitriptyline 50 MG tablet Commonly known as:  ELAVIL   benzonatate 100 MG capsule Commonly known as:  TESSALON   Biotin 1000 MCG tablet   loratadine 10 MG tablet Commonly known as:  CLARITIN     TAKE these medications   apixaban 5 MG Tabs tablet Commonly known as:  ELIQUIS Take 1 tablet (5 mg total) by mouth 2 (two) times daily.   aspirin 81 MG EC tablet Take 1 tablet (81 mg total) by mouth daily.   baclofen 10 MG tablet Commonly known as:  LIORESAL Take 0.5 tablets (5 mg total) by mouth 3 (three) times daily. What changed:  how much to take  how to take this  when to take this  additional instructions   dorzolamide-timolol 22.3-6.8 MG/ML ophthalmic  solution Commonly known as:  COSOPT Place 1 drop into both eyes 2 (two) times daily.   ENSURE CLEAR Liqd Take 237 mLs by mouth 2 (two) times daily between meals.   gabapentin 100 MG capsule Commonly known as:  NEURONTIN Take 100 mg by mouth 2 (two) times daily. At breakfast and lunch What changed:  Another medication with the same name was removed. Continue taking this medication, and follow the directions you see here.   HYDROcodone-acetaminophen 5-325 MG tablet Commonly known as:  NORCO/VICODIN Take 1 tablet by mouth 2 (two) times daily as needed for moderate pain or severe pain. Take one tablet by mouth at night as needed and take 1 tablet twice daily as needed for pain What changed:  Another medication with the same name was added. Make sure you understand how and when to take each.   HYDROcodone-acetaminophen 5-325 MG tablet Commonly known as:  NORCO/VICODIN Take 1 tablet by mouth 2 (two) times daily as needed for moderate pain or severe pain. What changed:  You were already taking a medication with the same name, and this prescription was added. Make sure you understand how and when to take each.   latanoprost 0.005 % ophthalmic solution Commonly known as:  XALATAN Place 1 drop into both eyes at bedtime.   levothyroxine 88 MCG tablet Commonly known as:  SYNTHROID, LEVOTHROID Take 88 mcg by mouth daily before breakfast.   lidocaine 5 % Commonly known as:  LIDODERM Place 1 patch onto the skin daily. Left lateral thigh  Remove & Discard patch within 12 hours or as directed by MD   metoprolol tartrate 25 MG tablet Commonly known as:  LOPRESSOR Take 25 mg by mouth 2 (two) times  daily.   mirtazapine 15 MG tablet Commonly known as:  REMERON Take 15 mg by mouth at bedtime.   pantoprazole 40 MG tablet Commonly known as:  PROTONIX Take 40 mg by mouth 2 (two) times daily.   pravastatin 20 MG tablet Commonly known as:  PRAVACHOL Take 1 tablet (20 mg total) by mouth daily at  6 PM.   tamsulosin 0.4 MG Caps capsule Commonly known as:  FLOMAX Take 0.4 mg by mouth daily.   TYLENOL 325 MG tablet Generic drug:  acetaminophen Take 650 mg by mouth every 6 (six) hours as needed for mild pain or moderate pain.   VITAMIN B-12 SL Place 1 tablet under the tongue daily.       Contact information for follow-up providers    SETHI,PRAMOD, MD. Schedule an appointment as soon as possible for a visit in 6 week(s).   Specialties:  Neurology, Radiology Contact information: [address] [place] [address] [phone]        [name], MD. Schedule an appointment as soon as possible for a visit in 2 week(s).   Specialty:  Internal Medicine Contact information: [address] [place] [address] [phone]            Contact information for after-discharge care    Destination    [facility] Follow up.   Specialty:  Skilled Nursing Facility Contact information: [address] [place] [address] [phone]                 Allergies  Allergen Reactions  . Ace Inhibitors Other (See Comments)    Severe fatigue  . Lipitor [Atorvastatin] Other (See Comments)    Mood swings  . Motrin [Ibuprofen] Nausea And Vomiting    Consultations:  Neurology  Procedures/Studies: Ct Angio Head W Or Wo Contrast  Result Date: 08/08/2016 CLINICAL DATA:  83 year old female with intracranial atherosclerosis including chronic distal right ACA occlusion. Recent MRI suggesting acute or subacute ischemia of the left internal capsule, while recent CTA was negative for emergent large vessel occlusion. Worsening mental status, now completely unresponsive. Initial encounter. EXAM: CT ANGIOGRAPHY HEAD AND NECK TECHNIQUE: Multidetector CT imaging of the head and neck was performed using the standard protocol during bolus administration of intravenous contrast. Multiplanar CT image reconstructions  and MIPs were obtained to evaluate the vascular anatomy. Carotid stenosis measurements (when applicable) are obtained utilizing NASCET criteria, using the distal internal carotid diameter as the denominator. CONTRAST:  50 mL Isovue 370 COMPARISON:  CTA head and neck 08/06/2016. Brain MRI 08/06/2016, and earlier. FINDINGS: CT HEAD Brain: Mild if any interval hypodensity at the left lateral thalamus and internal capsule corresponding to the MRA finding. Stable gray-white matter differentiation elsewhere. Chronic right ACA territory encephalomalacia. No acute intracranial hemorrhage identified. No midline shift, mass effect, or evidence of intracranial mass lesion. No ventriculomegaly. Calvarium and skull base: Stable. No acute osseous abnormality identified. Paranasal sinuses: Visualized paranasal sinuses and mastoids are stable and well pneumatized. Orbits: Stable orbit and scalp soft tissues. CTA NECK Skeleton: Degenerative changes in the spine. Osteopenia. No acute osseous abnormality identified. Upper chest: Stable lung apices. No superior mediastinal lymphadenopathy. Small calcified hilar and mediastinal lymph nodes compatible with prior granulomatous disease. Other neck: Stable and negative; diminutive or absent thyroid. Aortic arch: Stable 3 vessel arch configuration and calcified arch atherosclerosis. Right carotid system: Stable tortuosity with a kinked appearance of the proximal right CCA. Unchanged right carotid bifurcation and  cervical right ICA. Left carotid system: Stable left CCA tortuosity. Unchanged left carotid bifurcation and cervical left ICA. Vertebral arteries:Stable tortuosity and calcified plaque in the proximal right subclavian artery with a kinked appearance. Stable cervical right vertebral artery, negative except for tortuosity. Stable proximal left subclavian calcified plaque without stenosis. Stable cervical left vertebral artery, negative except for tortuosity. CTA HEAD Posterior  circulation: Stable distal vertebral artery is without stenosis. Both PICA origins remain patent. Patent vertebrobasilar junction. Stable basilar artery without stenosis. Unchanged PCA origins, moderate right P1 and P 2 irregularity and stenosis and moderate left P3 segment irregularity and stenosis. Posterior communicating arteries are diminutive or absent. Anterior circulation: Both ICA siphons remain patent and are stable with calcified plaque. Carotid termini are stable and normal. Stable bilateral ACA is including chronic occlusion of the distal right ACA, and moderate left ACA 8 pericallosal artery irregularity and stenosis. Moderate to severe left MCA M1 and mild to moderate right M1 irregularity and stenosis is stable. Both MCA bifurcations remain patent. Bilateral MCA branches are stable including right greater than left branch irregularity and stenosis. Venous sinuses: Patent. Anatomic variants: None. Delayed phase: No abnormal enhancement identified. Review of the MIP images confirms the above findings IMPRESSION: 1. Negative for emergent large vessel occlusion and Unchanged CTA head and neck findings from 2 days ago. 2. Arterial tortuosity in the neck with mild extracranial atherosclerosis and no extracranial stenosis. 3. Widespread intracranial atherosclerosis with chronic distal right ACA occlusion and moderate stenoses of the left ACA, bilateral MCAs (left greater than right M1n and right greater than left M2), and bilateral PCAs (right P1 and P2, and left P3). 4. Stable CT appearance of the brain since 08/06/2016. Electronically Signed   By: Odessa Fleming M.D.   On: 08/08/2016 12:26   Ct Angio Head W Or Wo Contrast  Result Date: 08/06/2016 CLINICAL DATA:  83 year old female with right facial droop and aphasia. Code stroke. Personal history of distal right ACA infarct and intracranial atherosclerosis. Initial encounter. EXAM: CT PERFUSION BRAIN CT ANGIOGRAPHY HEAD AND NECK TECHNIQUE: Multiphase CT  imaging of the brain was performed following IV bolus contrast injection. Subsequent parametric perfusion maps were calculated using RAPID software. Multidetector CT imaging of the head and neck was performed using the standard protocol during bolus administration of intravenous contrast. Multiplanar CT image reconstructions and MIPs were obtained to evaluate the vascular anatomy. Carotid stenosis measurements (when applicable) are obtained utilizing NASCET criteria, using the distal internal carotid diameter as the denominator. CONTRAST:  100 mL Isovue 370 COMPARISON:  Head CT without contrast today 0815 hours. Brain MRI and intracranial MRA 01/18/2015. FINDINGS: CT BRAIN PERFUSION FINDINGS CBF (<30%) Volume:  0 mL Perfusion (Tmax>6.0s) volume: 4 mL (likely artifactual) Mismatch Volume: Not applicable Infarction Location: Not applicable CTA NECK FINDINGS Skeleton: Advanced degenerative changes in the cervical spine. No acute osseous abnormality identified. Upper chest: No acute apical lung findings aside from mild atelectasis. No superior mediastinal lymphadenopathy. Other neck: Diminutive or absent thyroid. Negative larynx, pharynx, parapharyngeal spaces, retropharyngeal space, sublingual space, submandibular glands and parotid glands. No cervical lymphadenopathy. Aortic arch: 3 vessel arch configuration. Mild to moderate mostly distal arch calcified atherosclerosis. Right carotid system: No brachiocephalic artery stenosis. Tortuous right CCA origin and proximal right CCA with a kinked appearance but no other stenosis. Minimal circumferential soft plaque in the right CCA. At the right carotid bifurcation there is mostly calcified plaque without stenosis. Mildly tortuous otherwise negative cervical right ICA. Left carotid system: Minimal plaque at  the left CCA origin without stenosis. Tortuous left CCA. No stenosis proximal to the bifurcation. Mild calcified plaque at the bifurcation without stenosis. Mildly  tortuous cervical left ICA. Vertebral arteries: Calcified plaque and tortuosity at the proximal right subclavian artery origin. Normal right vertebral artery origin. Tortuous but otherwise negative cervical right vertebral artery. Calcified plaque in the proximal left subclavian artery without stenosis. Normal left vertebral artery origin. Tortuous left V1 segment. Tortuous left V2 and V3 segments with otherwise negative cervical left vertebral artery. CTA HEAD Posterior circulation: Both distal vertebral arteries taper in the V4 segments without focal stenosis. Both PICA origins are patent. Patent vertebrobasilar junction. No basilar stenosis. Normal SCA and left PCA origins. Mild to moderate irregularity of the right PCA origin and proximal P1 segment with stenosis. Similar right P2 segment findings (series 507, image 19). Mild left PCA irregularity. Moderate distal left PCA (P3 segment irregularity and stenosis (series 506, image 33). Posterior communicating arteries are diminutive or absent. Anterior circulation: Both ICA siphons are patent. There is moderate left siphon calcified plaque without significant stenosis. Similar moderate calcified plaque throughout the right ICA siphon with no significant stenosis. Patent carotid termini, MCA and ACA origins. The left ACA and a small anterior communicating artery are within normal limits. There is mild to moderate right A1 segment irregularity, and the right A2 segment is occluded proximal to the pericallosal artery level. There is mild to moderate distal left ACA branch irregularity (series 506, image 30). Right MCA M1 segment is mildly irregular. There is up to moderate mid right M1 segment irregularity and stenosis as seen on series 505, image 18 which is stable from the 2016 MRA. The right MCA bifurcation is patent. There is moderate right M2 branch irregularity and stenosis (series 506, image 22). The left mid M1 segment demonstrates moderate irregularity and  stenosis (series 505, image 20 and series 504, image 176) which appears stable to the 2016 MRA. The left MCA bifurcation remains patent. There is only mild left M2 branch irregularity and stenosis. Venous sinuses: Patent. Anatomic variants: None. Review of the MIP images confirms the above findings IMPRESSION: 1. CT Perfusion is negative for evidence of core infarct and CTA is negative for emergent large vessel occlusion. A text page with preliminary findings of negative ELVO was sent to [facility] on 08/06/2016 at 0832 hours. 2. Generalized arterial tortuosity in the neck with mild atherosclerosis and no stenosis. 3. More moderate to severe chronic intracranial atherosclerosis and stenosis with chronic occlusion of the distal right ACA. Significant proximal stenoses which appear stable to the 2016 intracranial MRA include - Moderate bilateral MCA M1 segment stenoses, and - Moderate Right P1 and P2 segments stenoses. 4. Up to moderate distal branch irregularity and stenosis in the left PCA, left ACA, and right greater than left MCA territories. Electronically Signed   By: Odessa Fleming M.D.   On: 08/06/2016 09:06   Ct Angio Neck W Or Wo Contrast  Result Date: 08/08/2016 CLINICAL DATA:  83 year old female with intracranial atherosclerosis including chronic distal right ACA occlusion. Recent MRI suggesting acute or subacute ischemia of the left internal capsule, while recent CTA was negative for emergent large vessel occlusion. Worsening mental status, now completely unresponsive. Initial encounter. EXAM: CT ANGIOGRAPHY HEAD AND NECK TECHNIQUE: Multidetector CT imaging of the head and neck was performed using the standard protocol during bolus administration of intravenous contrast. Multiplanar CT image reconstructions and MIPs were obtained to evaluate the vascular anatomy. Carotid stenosis measurements (when applicable)  are obtained utilizing NASCET criteria, using the distal internal carotid diameter as the  denominator. CONTRAST:  50 mL Isovue 370 COMPARISON:  CTA head and neck 08/06/2016. Brain MRI 08/06/2016, and earlier. FINDINGS: CT HEAD Brain: Mild if any interval hypodensity at the left lateral thalamus and internal capsule corresponding to the MRA finding. Stable gray-white matter differentiation elsewhere. Chronic right ACA territory encephalomalacia. No acute intracranial hemorrhage identified. No midline shift, mass effect, or evidence of intracranial mass lesion. No ventriculomegaly. Calvarium and skull base: Stable. No acute osseous abnormality identified. Paranasal sinuses: Visualized paranasal sinuses and mastoids are stable and well pneumatized. Orbits: Stable orbit and scalp soft tissues. CTA NECK Skeleton: Degenerative changes in the spine. Osteopenia. No acute osseous abnormality identified. Upper chest: Stable lung apices. No superior mediastinal lymphadenopathy. Small calcified hilar and mediastinal lymph nodes compatible with prior granulomatous disease. Other neck: Stable and negative; diminutive or absent thyroid. Aortic arch: Stable 3 vessel arch configuration and calcified arch atherosclerosis. Right carotid system: Stable tortuosity with a kinked appearance of the proximal right CCA. Unchanged right carotid bifurcation and cervical right ICA. Left carotid system: Stable left CCA tortuosity. Unchanged left carotid bifurcation and cervical left ICA. Vertebral arteries:Stable tortuosity and calcified plaque in the proximal right subclavian artery with a kinked appearance. Stable cervical right vertebral artery, negative except for tortuosity. Stable proximal left subclavian calcified plaque without stenosis. Stable cervical left vertebral artery, negative except for tortuosity. CTA HEAD Posterior circulation: Stable distal vertebral artery is without stenosis. Both PICA origins remain patent. Patent vertebrobasilar junction. Stable basilar artery without stenosis. Unchanged PCA origins, moderate  right P1 and P 2 irregularity and stenosis and moderate left P3 segment irregularity and stenosis. Posterior communicating arteries are diminutive or absent. Anterior circulation: Both ICA siphons remain patent and are stable with calcified plaque. Carotid termini are stable and normal. Stable bilateral ACA is including chronic occlusion of the distal right ACA, and moderate left ACA 8 pericallosal artery irregularity and stenosis. Moderate to severe left MCA M1 and mild to moderate right M1 irregularity and stenosis is stable. Both MCA bifurcations remain patent. Bilateral MCA branches are stable including right greater than left branch irregularity and stenosis. Venous sinuses: Patent. Anatomic variants: None. Delayed phase: No abnormal enhancement identified. Review of the MIP images confirms the above findings IMPRESSION: 1. Negative for emergent large vessel occlusion and Unchanged CTA head and neck findings from 2 days ago. 2. Arterial tortuosity in the neck with mild extracranial atherosclerosis and no extracranial stenosis. 3. Widespread intracranial atherosclerosis with chronic distal right ACA occlusion and moderate stenoses of the left ACA, bilateral MCAs (left greater than right M1n and right greater than left M2), and bilateral PCAs (right P1 and P2, and left P3). 4. Stable CT appearance of the brain since 08/06/2016. Electronically Signed   By: Odessa Fleming M.D.   On: 08/08/2016 12:26   Ct Angio Neck W Or Wo Contrast  Result Date: 08/06/2016 CLINICAL DATA:  83 year old female with right facial droop and aphasia. Code stroke. Personal history of distal right ACA infarct and intracranial atherosclerosis. Initial encounter. EXAM: CT PERFUSION BRAIN CT ANGIOGRAPHY HEAD AND NECK TECHNIQUE: Multiphase CT imaging of the brain was performed following IV bolus contrast injection. Subsequent parametric perfusion maps were calculated using RAPID software. Multidetector CT imaging of the head and neck was performed  using the standard protocol during bolus administration of intravenous contrast. Multiplanar CT image reconstructions and MIPs were obtained to evaluate the vascular anatomy. Carotid stenosis measurements (when  applicable) are obtained utilizing NASCET criteria, using the distal internal carotid diameter as the denominator. CONTRAST:  100 mL Isovue 370 COMPARISON:  Head CT without contrast today 0815 hours. Brain MRI and intracranial MRA 01/18/2015. FINDINGS: CT BRAIN PERFUSION FINDINGS CBF (<30%) Volume:  0 mL Perfusion (Tmax>6.0s) volume: 4 mL (likely artifactual) Mismatch Volume: Not applicable Infarction Location: Not applicable CTA NECK FINDINGS Skeleton: Advanced degenerative changes in the cervical spine. No acute osseous abnormality identified. Upper chest: No acute apical lung findings aside from mild atelectasis. No superior mediastinal lymphadenopathy. Other neck: Diminutive or absent thyroid. Negative larynx, pharynx, parapharyngeal spaces, retropharyngeal space, sublingual space, submandibular glands and parotid glands. No cervical lymphadenopathy. Aortic arch: 3 vessel arch configuration. Mild to moderate mostly distal arch calcified atherosclerosis. Right carotid system: No brachiocephalic artery stenosis. Tortuous right CCA origin and proximal right CCA with a kinked appearance but no other stenosis. Minimal circumferential soft plaque in the right CCA. At the right carotid bifurcation there is mostly calcified plaque without stenosis. Mildly tortuous otherwise negative cervical right ICA. Left carotid system: Minimal plaque at the left CCA origin without stenosis. Tortuous left CCA. No stenosis proximal to the bifurcation. Mild calcified plaque at the bifurcation without stenosis. Mildly tortuous cervical left ICA. Vertebral arteries: Calcified plaque and tortuosity at the proximal right subclavian artery origin. Normal right vertebral artery origin. Tortuous but otherwise negative cervical right  vertebral artery. Calcified plaque in the proximal left subclavian artery without stenosis. Normal left vertebral artery origin. Tortuous left V1 segment. Tortuous left V2 and V3 segments with otherwise negative cervical left vertebral artery. CTA HEAD Posterior circulation: Both distal vertebral arteries taper in the V4 segments without focal stenosis. Both PICA origins are patent. Patent vertebrobasilar junction. No basilar stenosis. Normal SCA and left PCA origins. Mild to moderate irregularity of the right PCA origin and proximal P1 segment with stenosis. Similar right P2 segment findings (series 507, image 19). Mild left PCA irregularity. Moderate distal left PCA (P3 segment irregularity and stenosis (series 506, image 33). Posterior communicating arteries are diminutive or absent. Anterior circulation: Both ICA siphons are patent. There is moderate left siphon calcified plaque without significant stenosis. Similar moderate calcified plaque throughout the right ICA siphon with no significant stenosis. Patent carotid termini, MCA and ACA origins. The left ACA and a small anterior communicating artery are within normal limits. There is mild to moderate right A1 segment irregularity, and the right A2 segment is occluded proximal to the pericallosal artery level. There is mild to moderate distal left ACA branch irregularity (series 506, image 30). Right MCA M1 segment is mildly irregular. There is up to moderate mid right M1 segment irregularity and stenosis as seen on series 505, image 18 which is stable from the 2016 MRA. The right MCA bifurcation is patent. There is moderate right M2 branch irregularity and stenosis (series 506, image 22). The left mid M1 segment demonstrates moderate irregularity and stenosis (series 505, image 20 and series 504, image 176) which appears stable to the 2016 MRA. The left MCA bifurcation remains patent. There is only mild left M2 branch irregularity and stenosis. Venous sinuses:  Patent. Anatomic variants: None. Review of the MIP images confirms the above findings IMPRESSION: 1. CT Perfusion is negative for evidence of core infarct and CTA is negative for emergent large vessel occlusion. A text page with preliminary findings of negative ELVO [facility] 08/06/2016 at 0832 hours. 2. Generalized arterial tortuosity in the neck with mild atherosclerosis and  no stenosis. 3. More moderate to severe chronic intracranial atherosclerosis and stenosis with chronic occlusion of the distal right ACA. Significant proximal stenoses which appear stable to the 2016 intracranial MRA include - Moderate bilateral MCA M1 segment stenoses, and - Moderate Right P1 and P2 segments stenoses. 4. Up to moderate distal branch irregularity and stenosis in the left PCA, left ACA, and right greater than left MCA territories. Electronically Signed   By: Odessa Fleming M.D.   On: 08/06/2016 09:06   Mr Maxine Glenn Head Wo Contrast  Result Date: 08/06/2016 CLINICAL DATA:  83 y/o F; slurred speech and right facial drooping. Leftward gaze. EXAM: MRI HEAD WITHOUT CONTRAST MRA HEAD WITHOUT CONTRAST TECHNIQUE: Multiplanar, multiecho pulse sequences of the brain and surrounding structures were obtained without intravenous contrast. Angiographic images of the head were obtained using MRA technique without contrast. COMPARISON:  CT head, CT angiogram head, and CT cerebral perfusion dated 08/06/2016. MRI brain and MRA head dated 01/18/2015. FINDINGS: MRI HEAD FINDINGS Brain: 12 x 7 mm focus of diffusion restriction (AP by ML series 3, image 30 series 300, image 30) centered within the left posterior limb of internal capsule consistent with acute/ early subacute infarction. Chronic distal right ACA distribution infarction with laminar necrosis. Chronic infarct within the left lentiform nucleus. Stable background of chronic microvascular ischemic changes and parenchymal volume loss of the brain. No new susceptibility  hypointensity to indicate interval intracranial hemorrhage. Vascular: As below. Skull and upper cervical spine: Choose Sinuses/Orbits: Mild right maxillary sinus mucosal thickening and ethmoid sinus mucosal thickening. Bilateral intra-ocular lens replacement. Trace left mastoid tip effusion. Otherwise negative. Other: None. MRA HEAD FINDINGS Moderate motion artifact, suboptimal evaluation for subtle stenosis or aneurysm. No gross interval change from prior CT angiogram with persistent chronic occlusion of the distal right anterior cerebral artery and intracranial atherosclerosis with areas of mild-to-moderate stenosis most pronounced and bilateral M1 and P1 segments. No proximal large vessel occlusion of the bilateral middle cerebral arteries, left anterior cerebral artery, bilateral posterior cerebral arteries, vertebral arteries, internal carotid arteries, or the basilar artery. IMPRESSION: 1. Small acute/ early subacute infarct within the left posterior limb of internal capsule. No acute hemorrhage identified. 2. Stable chronic distal right ACA and left lentiform nuclei infarcts. 3. Stable moderate chronic microvascular ischemic changes and parenchymal volume loss of the brain. 4. Mild paranasal sinus disease. 5. Motion degraded time-of-flight MRA. No gross interval change from prior CT angiogram. No acute proximal large vessel occlusion. These results will be called to the ordering clinician or representative by the Radiologist Assistant, and communication documented in the PACS or zVision Dashboard. Electronically Signed   By: Mitzi Hansen M.D.   On: 08/06/2016 15:09   Mr Brain Wo Contrast  Result Date: 08/06/2016 CLINICAL DATA:  83 y/o F; slurred speech and right facial drooping. Leftward gaze. EXAM: MRI HEAD WITHOUT CONTRAST MRA HEAD WITHOUT CONTRAST TECHNIQUE: Multiplanar, multiecho pulse sequences of the brain and surrounding structures were obtained without intravenous contrast.  Angiographic images of the head were obtained using MRA technique without contrast. COMPARISON:  CT head, CT angiogram head, and CT cerebral perfusion dated 08/06/2016. MRI brain and MRA head dated 01/18/2015. FINDINGS: MRI HEAD FINDINGS Brain: 12 x 7 mm focus of diffusion restriction (AP by ML series 3, image 30 series 300, image 30) centered within the left posterior limb of internal capsule consistent with acute/ early subacute infarction. Chronic distal right ACA distribution infarction with laminar necrosis. Chronic infarct within the left lentiform nucleus. Stable background  of chronic microvascular ischemic changes and parenchymal volume loss of the brain. No new susceptibility hypointensity to indicate interval intracranial hemorrhage. Vascular: As below. Skull and upper cervical spine: Choose Sinuses/Orbits: Mild right maxillary sinus mucosal thickening and ethmoid sinus mucosal thickening. Bilateral intra-ocular lens replacement. Trace left mastoid tip effusion. Otherwise negative. Other: None. MRA HEAD FINDINGS Moderate motion artifact, suboptimal evaluation for subtle stenosis or aneurysm. No gross interval change from prior CT angiogram with persistent chronic occlusion of the distal right anterior cerebral artery and intracranial atherosclerosis with areas of mild-to-moderate stenosis most pronounced and bilateral M1 and P1 segments. No proximal large vessel occlusion of the bilateral middle cerebral arteries, left anterior cerebral artery, bilateral posterior cerebral arteries, vertebral arteries, internal carotid arteries, or the basilar artery. IMPRESSION: 1. Small acute/ early subacute infarct within the left posterior limb of internal capsule. No acute hemorrhage identified. 2. Stable chronic distal right ACA and left lentiform nuclei infarcts. 3. Stable moderate chronic microvascular ischemic changes and parenchymal volume loss of the brain. 4. Mild paranasal sinus disease. 5. Motion degraded  time-of-flight MRA. No gross interval change from prior CT angiogram. No acute proximal large vessel occlusion. These results will be called to the ordering clinician or representative by the Radiologist Assistant, and communication documented in the PACS or zVision Dashboard. Electronically Signed   By: Mitzi Hansen M.D.   On: 08/06/2016 15:09   US Renal  Result Date: 08/08/2016 CLINICAL DATA:  Bladder outlet obstruction. EXAM: RENAL / URINARY TRACT ULTRASOUND COMPLETE COMPARISON:  CT 10/30/2012 FINDINGS: Right Kidney: Length: 9.2 cm. Echogenicity within normal limits. No mass or hydronephrosis visualized. Left Kidney: Technically limited evaluation. Length: 9.5 cm. Echogenicity within normal limits. No mass or hydronephrosis visualized. Bladder: Decompressed by Foley catheter and not evaluated. IMPRESSION: No hydronephrosis. Bladder decompressed by Foley catheter and not evaluated. Electronically Signed   By: Rubye Oaks M.D.   On: 08/08/2016 01:04   Ct Cerebral Perfusion W Contrast  Result Date: 08/06/2016 CLINICAL DATA:  83 year old female with right facial droop and aphasia. Code stroke. Personal history of distal right ACA infarct and intracranial atherosclerosis. Initial encounter. EXAM: CT PERFUSION BRAIN CT ANGIOGRAPHY HEAD AND NECK TECHNIQUE: Multiphase CT imaging of the brain was performed following IV bolus contrast injection. Subsequent parametric perfusion maps were calculated using RAPID software. Multidetector CT imaging of the head and neck was performed using the standard protocol during bolus administration of intravenous contrast. Multiplanar CT image reconstructions and MIPs were obtained to evaluate the vascular anatomy. Carotid stenosis measurements (when applicable) are obtained utilizing NASCET criteria, using the distal internal carotid diameter as the denominator. CONTRAST:  100 mL Isovue 370 COMPARISON:  Head CT without contrast today 0815 hours. Brain MRI and  intracranial MRA 01/18/2015. FINDINGS: CT BRAIN PERFUSION FINDINGS CBF (<30%) Volume:  0 mL Perfusion (Tmax>6.0s) volume: 4 mL (likely artifactual) Mismatch Volume: Not applicable Infarction Location: Not applicable CTA NECK FINDINGS Skeleton: Advanced degenerative changes in the cervical spine. No acute osseous abnormality identified. Upper chest: No acute apical lung findings aside from mild atelectasis. No superior mediastinal lymphadenopathy. Other neck: Diminutive or absent thyroid. Negative larynx, pharynx, parapharyngeal spaces, retropharyngeal space, sublingual space, submandibular glands and parotid glands. No cervical lymphadenopathy. Aortic arch: 3 vessel arch configuration. Mild to moderate mostly distal arch calcified atherosclerosis. Right carotid system: No brachiocephalic artery stenosis. Tortuous right CCA origin and proximal right CCA with a kinked appearance but no other stenosis. Minimal circumferential soft plaque in the right CCA. At the right carotid bifurcation  there is mostly calcified plaque without stenosis. Mildly tortuous otherwise negative cervical right ICA. Left carotid system: Minimal plaque at the left CCA origin without stenosis. Tortuous left CCA. No stenosis proximal to the bifurcation. Mild calcified plaque at the bifurcation without stenosis. Mildly tortuous cervical left ICA. Vertebral arteries: Calcified plaque and tortuosity at the proximal right subclavian artery origin. Normal right vertebral artery origin. Tortuous but otherwise negative cervical right vertebral artery. Calcified plaque in the proximal left subclavian artery without stenosis. Normal left vertebral artery origin. Tortuous left V1 segment. Tortuous left V2 and V3 segments with otherwise negative cervical left vertebral artery. CTA HEAD Posterior circulation: Both distal vertebral arteries taper in the V4 segments without focal stenosis. Both PICA origins are patent. Patent vertebrobasilar junction. No  basilar stenosis. Normal SCA and left PCA origins. Mild to moderate irregularity of the right PCA origin and proximal P1 segment with stenosis. Similar right P2 segment findings (series 507, image 19). Mild left PCA irregularity. Moderate distal left PCA (P3 segment irregularity and stenosis (series 506, image 33). Posterior communicating arteries are diminutive or absent. Anterior circulation: Both ICA siphons are patent. There is moderate left siphon calcified plaque without significant stenosis. Similar moderate calcified plaque throughout the right ICA siphon with no significant stenosis. Patent carotid termini, MCA and ACA origins. The left ACA and a small anterior communicating artery are within normal limits. There is mild to moderate right A1 segment irregularity, and the right A2 segment is occluded proximal to the pericallosal artery level. There is mild to moderate distal left ACA branch irregularity (series 506, image 30). Right MCA M1 segment is mildly irregular. There is up to moderate mid right M1 segment irregularity and stenosis as seen on series 505, image 18 which is stable from the 2016 MRA. The right MCA bifurcation is patent. There is moderate right M2 branch irregularity and stenosis (series 506, image 22). The left mid M1 segment demonstrates moderate irregularity and stenosis (series 505, image 20 and series 504, image 176) which appears stable to the 2016 MRA. The left MCA bifurcation remains patent. There is only mild left M2 branch irregularity and stenosis. Venous sinuses: Patent. Anatomic variants: None. Review of the MIP images confirms the above findings IMPRESSION: 1. CT Perfusion is negative for evidence of core infarct and CTA is negative for emergent large vessel occlusion. A text page with preliminary findings of negative ELVO was sent to [facility] on 08/06/2016 at 0832 hours. 2. Generalized arterial tortuosity in the neck with mild atherosclerosis and no stenosis. 3.  More moderate to severe chronic intracranial atherosclerosis and stenosis with chronic occlusion of the distal right ACA. Significant proximal stenoses which appear stable to the 2016 intracranial MRA include - Moderate bilateral MCA M1 segment stenoses, and - Moderate Right P1 and P2 segments stenoses. 4. Up to moderate distal branch irregularity and stenosis in the left PCA, left ACA, and right greater than left MCA territories. Electronically Signed   By: Odessa Fleming M.D.   On: 08/06/2016 09:06   Dg Chest Portable 1 View  Result Date: 08/06/2016 CLINICAL DATA:  Stroke symptoms EXAM: PORTABLE CHEST 1 VIEW COMPARISON:  02/07/2015 FINDINGS: Normal cardiac silhouette. Improvement aeration lung bases. Small LEFT effusion remains. Chronic bronchitic markings. Hiatal hernia noted. IMPRESSION: 1. Hiatal hernia and LEFT effusion. 2. No pulmonary edema or infiltrate. Electronically Signed   By: Genevive Bi M.D.   On: 08/06/2016 09:13   Ct Head Code Stroke W/o Cm  Result Date: 08/06/2016 CLINICAL  DATA:  Code stroke. 83 year old female with aphasia and right facial droop. Initial encounter. EXAM: CT HEAD WITHOUT CONTRAST TECHNIQUE: Contiguous axial images were obtained from the base of the skull through the vertex without intravenous contrast. COMPARISON:  12/22/2015 and earlier. FINDINGS: Brain: Sequelae of posterior right ACA infarct with encephalomalacia in the posterosuperior right frontal lobe and medial right parietal lobe. No acute intracranial hemorrhage identified. No midline shift, mass effect, or evidence of intracranial mass lesion. No ventriculomegaly. There is subtle loss of gray-white matter differentiation in the posterior left MCA territory such as on series 21, image 21 in the posterior frontal and parietal lobe. Stable either dilated perivascular space or chronic lacunar infarct at the inferior left basal ganglia. Vascular: Calcified atherosclerosis at the skull base. Suspicious hyperdensity at  the left ICA terminus on coronal image 36. Skull: No acute osseous abnormality identified. Sinuses/Orbits: Visualized paranasal sinuses and mastoids are stable and well pneumatized. Other: No acute orbit or scalp soft tissue findings. ASPECTS [facility] Stroke Program Early CT Score) - Ganglionic level infarction (caudate, lentiform nuclei, internal capsule, insula, M1-M3 cortex): 7 - Supraganglionic infarction (M4-M6 cortex): 2 Total score (0-10 with 10 being normal): 9 IMPRESSION: 1. Suspicious hyperdensity at the left ICA terminus and suspect subtle loss of gray-white differentiation in the posterior left MCA territory. No acute hemorrhage or intracranial mass effect. 2. ASPECTS is 9. 3. Chronic posterior right ACA infarct. 4. Study discussed by telephone with Dr. Aram Beecham MESNER on 08/06/2016 at 0823 hours Electronically Signed   By: Odessa Fleming M.D.   On: 08/06/2016 08:29    Subjective: Without complaints  Discharge Exam: Vitals:   08/09/16 0415 08/09/16 1051  BP: (!) 166/85 (!) 161/90  Pulse: 80 80  Resp: 18 19  Temp: 98.4 F (36.9 C) 98.3 F (36.8 C)   Vitals:   08/08/16 2144 08/09/16 0152 08/09/16 0415 08/09/16 1051  BP: (!) 165/81 (!) 149/67 (!) 166/85 (!) 161/90  Pulse: 72 77 80 80  Resp: 18 20 18 19   Temp: 98 F (36.7 C) 98.5 F (36.9 C) 98.4 F (36.9 C) 98.3 F (36.8 C)  TempSrc: Oral Oral Oral Oral  SpO2: 95% 94% 95% 97%  Weight:      Height:        General: Pt is alert, awake, not in acute distress Cardiovascular: RRR, S1/S2 +, no rubs, no gallops Respiratory: CTA bilaterally, no wheezing, no rhonchi Abdominal: Soft, NT, ND, bowel sounds + Extremities: no edema, no cyanosis   The results of significant diagnostics from this hospitalization (including imaging, microbiology, ancillary and laboratory) are listed below for reference.     Microbiology: No results found for this or any previous visit (from the past 240 hour(s)).   Labs: BNP (last 3  results) No results for input(s): BNP in the last 8760 hours. Basic Metabolic Panel:  Recent Labs Lab 08/06/16 0759 08/06/16 0812 08/08/16 1216  NA 142 145 138  K 4.0 3.9 4.2  CL 107 103 102  CO2 31  --  25  GLUCOSE 105* 103* 95  BUN 15 19 14   CREATININE 0.93 1.00 0.78  CALCIUM 9.7  --  9.2   Liver Function Tests:  Recent Labs Lab 08/06/16 0759  AST 22  ALT 18  ALKPHOS 65  BILITOT 0.3  PROT 7.5  ALBUMIN 3.9   No results for input(s): LIPASE, AMYLASE in the last 168 hours. No results for input(s): AMMONIA in the last 168 hours. CBC:  Recent Labs Lab  08/06/16 0759 08/06/16 0812  WBC 6.6  --   NEUTROABS 3.3  --   HGB 12.8 14.3  HCT 40.3 42.0  MCV 90.0  --   PLT 246  --    Cardiac Enzymes: No results for input(s): CKTOTAL, CKMB, CKMBINDEX, TROPONINI in the last 168 hours. BNP: Invalid input(s): POCBNP CBG: No results for input(s): GLUCAP in the last 168 hours. D-Dimer No results for input(s): DDIMER in the last 72 hours. Hgb A1c  Recent Labs  08/07/16 0715  HGBA1C 5.8*   Lipid Profile  Recent Labs  08/07/16 0715  CHOL 180  HDL 54  LDLCALC 118*  TRIG 40  CHOLHDL 3.3   Thyroid function studies No results for input(s): TSH, T4TOTAL, T3FREE, THYROIDAB in the last 72 hours.  Invalid input(s): FREET3 Anemia work up No results for input(s): VITAMINB12, FOLATE, FERRITIN, TIBC, IRON, RETICCTPCT in the last 72 hours. Urinalysis    Component Value Date/Time   COLORURINE STRAW (A) 08/06/2016 1018   APPEARANCEUR CLEAR 08/06/2016 1018   LABSPEC 1.015 08/06/2016 1018   PHURINE 8.0 08/06/2016 1018   GLUCOSEU NEGATIVE 08/06/2016 1018   HGBUR SMALL (A) 08/06/2016 1018   BILIRUBINUR NEGATIVE 08/06/2016 1018   KETONESUR NEGATIVE 08/06/2016 1018   PROTEINUR NEGATIVE 08/06/2016 1018   UROBILINOGEN 1.0 01/25/2015 0547   NITRITE NEGATIVE 08/06/2016 1018   LEUKOCYTESUR NEGATIVE 08/06/2016 1018   Sepsis Labs Invalid input(s): PROCALCITONIN,  WBC,   LACTICIDVEN Microbiology No results found for this or any previous visit (from the past 240 hour(s)).   SIGNED:   Jerald Kief, MD  [facility] 08/09/2016, 12:23 PM  If 7PM-7AM, please contact night-coverage [email or web address] Password [email or web address]

## 2016-08-09 NOTE — Progress Notes (Signed)
Patient for discharge to [facility]. No apparent distress noted. More alert and oriented x3-4, slurred speech still noted. [facility]. All questions were answered.

## 2016-08-09 NOTE — Clinical Social Work Placement (Signed)
   CLINICAL SOCIAL WORK PLACEMENT  NOTE  Date:  08/09/2016  Patient Details  Name: Angela Keith MRN: [id number] Date of Birth: [birth year]  Clinical Social Work is seeking post-discharge placement for this patient at the Skilled  Nursing Facility level of care (*CSW will initial, date and re-position this form in  chart as items are completed):  Yes   Patient/family provided with [facility] Clinical Social Work Department's list of facilities offering this level of care within the geographic area requested by the patient (or if unable, by the patient's family).  Yes   Patient/family informed of their freedom to choose among providers that offer the needed level of care, that participate in Medicare, Medicaid or managed care program needed by the patient, have an available bed and are willing to accept the patient.  Yes   Patient/family informed of [facility] ownership interest [facility] and [facility], as well as of the fact that they are under no obligation to receive care at these facilities.  PASRR submitted to EDS on       PASRR number received on       Existing PASRR number confirmed on 08/08/16     FL2 transmitted to all facilities in geographic area requested by pt/family on 08/08/16     FL2 transmitted to all facilities within larger geographic area on       Patient informed that his/her managed care company has contracts with or will negotiate with certain facilities, including the following:        Yes   Patient/family informed of bed offers received.  Patient chooses bed at  [facility])     Physician recommends and patient chooses bed at      Patient to be transferred to  [facility]) on 08/09/16.  Patient to be transferred to facility by  Sharin Mons([facility])     Patient family notified on 08/09/16 of transfer.  Name of family member notified:  Dtr @ bedside     PHYSICIAN Please sign FL2     Additional Comment:     _______________________________________________ Norlene DuelBROWN, Vickie Melnik B, LCSWA 08/09/2016, 1:17 PM

## 2016-08-09 NOTE — Care Management Important Message (Signed)
Important Message  Patient Details  Name: Angela PavlovRachel E Keith MRN: [id number] Date of Birth: April 24, 1930   Medicare Important Message Given:  Yes    [name] [name] 08/09/2016, 11:49 AM

## 2016-08-09 NOTE — Progress Notes (Signed)
Speech Language Pathology Treatment: Dysphagia  Patient Details Name: Angela Keith MRN: [id number] DOB: 17-Sep-1929 Today's Date: 08/09/2016 Time: [id number] SLP Time Calculation (min) (ACUTE ONLY): 13 min  Assessment / Plan / Recommendation Clinical Impression  Pt presents similarly to initial evaluation: mild right-sided pocketing with small bites of softer textures, liquid washes utilized for clearance, and no overt s/s of aspiration. Changes on previous date were felt to be related to polypharmacy. Recommend to continue with [name] diet and thin liquids. Daughter-in-law at bedside (a former [employer] fundraiserN) was educated about recommendations and strategies to use. Pt will benefit from additional SLP f/u at SNF.    HPI HPI: Patient is an 83 y/o female with h/o HTN, HLD, hypothyroid, CVA w/ L weakness admitted with right facial drooping, dysarthria, known previous CVA with left-sided deficits. Imaging consistent with acute left MCA CVA.      SLP Plan  Continue with current plan of care     Recommendations  Diet recommendations: Dysphagia 2 (fine chop);Thin liquid Liquids provided via: Cup;Straw Medication Administration: Whole meds with puree Supervision: Staff to assist with self feeding;Full supervision/cueing for compensatory strategies Compensations: Slow rate;Small sips/bites;Follow solids with liquid;Lingual sweep for clearance of pocketing Postural Changes and/or Swallow Maneuvers: Seated upright 90 degrees;Upright 30-60 min after meal                Oral Care Recommendations: Oral care BID Follow up Recommendations: Skilled Nursing facility Plan: Continue with current plan of care       GO                [name], [name] 08/09/2016, 3:47 PM  [name], M.A. CCC-SLP [phone]

## 2016-08-09 NOTE — Progress Notes (Signed)
Initial Nutrition Assessment   INTERVENTION:  Ensure Enlive po TID, each supplement provides 350 kcal and 20 grams of protein  NUTRITION DIAGNOSIS:   Predicted suboptimal nutrient intake related to lethargy/confusion, poor appetite as evidenced by per patient/family report, mild depletion of muscle mass.   GOAL:   Patient will meet greater than or equal to 90% of their needs   MONITOR:   PO intake, Supplement acceptance, Weight trends, Labs, Skin  REASON FOR ASSESSMENT:   Low Braden    ASSESSMENT:    83 y.o. female with history of CVA in 2016 with left-sided deficits that are persistent and associated with left lower extremity spasticity, hypertension, hypothyroidism, atrial fibrillation on eliquis, spinal stenosis with neurogenic claudication, dyslipidemia, GERD and insomnia presenting with garbled speech.   Daughter at bedside states that pt just woke up from a 36 hr nap and is still drowsy. Per daughter, pt was eating 3 small meals daily PTA and drinking chocolate Boost once daily. She states that pt has only eaten a few bites of food a couple times in the past few days due to lethargy. Daughter is unsure of pt's usual weight, but states that pt had lost down to 125 lbs this past summer and has since regained. Daughter feels that pt would doe well receiving nutritional supplements after each meal.  Pt has some mild muscle wasting of clavicles and mild fat wasting or arms, but otherwise appears fairly well-nourished.   Labs reviewed.   Diet Order:  DIET [name] Room service appropriate? Yes; Fluid consistency: Thin  Skin:  Reviewed, no issues  Last BM:  1/31  Height:   Ht Readings from Last 1 Encounters:  08/06/16 5\' 4"  (1.626 m)    Weight:   Wt Readings from Last 1 Encounters:  08/06/16 141 lb 12.8 oz (64.3 kg)    Ideal Body Weight:  54.5 kg  BMI:  Body mass index is 24.34 kg/m.  Estimated Nutritional Needs:   Kcal:  1400-1600  Protein:  75-85  grams  Fluid:  1.4-1.6 L/day  EDUCATION NEEDS:   No education needs identified at this time  Dorothea Ogleeanne Deana Krock RD, CSP, LDN Inpatient Clinical Dietitian Pager: [phone] After Hours Pager: [phone]

## 2016-08-09 NOTE — Clinical Social Work Note (Signed)
Clinical Social Worker facilitated patient discharge including contacting patient family and facility to confirm patient discharge plans.  Clinical information faxed to facility and family agreeable with plan.  CSW arranged ambulance transport via [facility] to. RN to call report [phone] prior to discharge.  Clinical Social Worker will sign off for now as social work intervention is no longer needed. Please consult us again if new need arises.  Jamas Jaquay B. Gean QuintBrown,MSW, LCSWA Clinical Social Work Dept Weekend Social Worker [phone] 1:15 PM

## 2016-08-09 NOTE — Progress Notes (Signed)
Physical Therapy Treatment Patient Details Name: Angela Keith MRN: [id number] DOB: [birth year] Today's Date: 08/09/2016    History of Present Illness Patient is an 83 y/o female with h/o HTN, HLD, hypothyroid, CVA w/ L weakness admitted with right facial drooping, dysarthria, known previous CVA with left-sided deficits. Imaging consistent with acute left MCA CVA.    PT Comments    Patient presents with mobility similar to prior to oversedation.  Continues to require at least mod A for mobility and has difficulty with forward weight shifts needing assist for safety.  Continue to recommend SNF rehab at d/c.  Follow Up Recommendations  SNF;Supervision/Assistance - 24 hour     Equipment Recommendations  None recommended by PT    Recommendations for Other Services       Precautions / Restrictions Precautions Precautions: Fall Precaution Comments: L LE flexor withdrawl    Mobility  Bed Mobility Overal bed mobility: Needs Assistance       Supine to sit: Mod assist     General bed mobility comments: guiding legs off bed and assist to lift trunk upright  Transfers Overall transfer level: Needs assistance   Transfers: Stand Pivot Transfers   Stand pivot transfers: Mod assist;+2 safety/equipment          Ambulation/Gait                 Stairs            Wheelchair Mobility    Modified Rankin (Stroke Patients Only) Modified Rankin (Stroke Patients Only) Pre-Morbid Rankin Score: Moderate disability Modified Rankin: Severe disability     Balance Overall balance assessment: Needs assistance Sitting-balance support: Single extremity supported Sitting balance-Leahy Scale: Poor Sitting balance - Comments: assist or reminders to lean forward with R foot on floor Postural control: Posterior lean Standing balance support: Single extremity supported;During functional activity Standing balance-Leahy Scale: Poor                      Cognition  Arousal/Alertness: Awake/alert Behavior During Therapy: Flat affect Overall Cognitive Status: Difficult to assess Area of Impairment: Problem solving;Attention;Following commands   Current Attention Level: Focused   Following Commands: Follows one step commands inconsistently     Problem Solving: Decreased initiation;Slow processing;Requires verbal cues;Requires tactile cues      Exercises      General Comments General comments (skin integrity, edema, etc.): daughter in room reports had already assisted with ROM of LE's and planned SLP visit this pm to make sure diet okay after solemnance x 32 hours      Pertinent Vitals/Pain Pain Assessment: No/denies pain    Home Living                      Prior Function            PT Goals (current goals can now be found in the care plan section) Progress towards PT goals: Progressing toward goals    Frequency    Min 3X/week      PT Plan Current plan remains appropriate;Frequency needs to be updated    Co-evaluation             End of Session Equipment Utilized During Treatment: Gait belt Activity Tolerance: Patient tolerated treatment well Patient left: in chair;with chair alarm set;with family/visitor present     Time: 4098-1191 PT Time Calculation (min) (ACUTE ONLY): 17 min  Charges:  $Therapeutic Activity: 8-22 mins  G Codes:      Elray McgregorCynthia Zell Doucette 08/09/2016, 3:10 PM  Sheran Lawlessyndi Tyrianna Lightle, [place]PT [phone] 08/09/2016

## 2016-08-12 ENCOUNTER — Ambulatory Visit (INDEPENDENT_AMBULATORY_CARE_PROVIDER_SITE_OTHER): Payer: Medicare Other | Admitting: *Deleted

## 2016-08-12 ENCOUNTER — Other Ambulatory Visit: Payer: Self-pay | Admitting: Internal Medicine

## 2016-08-12 DIAGNOSIS — I639 Cerebral infarction, unspecified: Secondary | ICD-10-CM

## 2016-08-12 NOTE — Telephone Encounter (Signed)
Pt last saw Dr Ladona Ridgelaylor on 06/06/15, overdue for follow-up.  Had appt scheduled for 08/09/16 but pt was in the hospital. Pt has since been discharged from hospital to [facility].  Attempted to call them to make sure pt was receiving med from them at present, LM with facility TCB to advise.  Last Creat in hospital on 08/08/16 was 0.78.  Last weight 64.3kg. Eliquis 5mg  BID is appropriate dosing.

## 2016-08-13 ENCOUNTER — Encounter: Payer: Self-pay | Admitting: Internal Medicine

## 2016-08-13 ENCOUNTER — Non-Acute Institutional Stay (SKILLED_NURSING_FACILITY): Payer: Medicare Other | Admitting: Internal Medicine

## 2016-08-13 DIAGNOSIS — F32A Depression, unspecified: Secondary | ICD-10-CM

## 2016-08-13 DIAGNOSIS — F329 Major depressive disorder, single episode, unspecified: Secondary | ICD-10-CM | POA: Diagnosis not present

## 2016-08-13 DIAGNOSIS — I10 Essential (primary) hypertension: Secondary | ICD-10-CM | POA: Diagnosis not present

## 2016-08-13 DIAGNOSIS — E034 Atrophy of thyroid (acquired): Secondary | ICD-10-CM | POA: Diagnosis not present

## 2016-08-13 DIAGNOSIS — I63512 Cerebral infarction due to unspecified occlusion or stenosis of left middle cerebral artery: Secondary | ICD-10-CM

## 2016-08-13 DIAGNOSIS — G8194 Hemiplegia, unspecified affecting left nondominant side: Secondary | ICD-10-CM | POA: Diagnosis not present

## 2016-08-13 DIAGNOSIS — I480 Paroxysmal atrial fibrillation: Secondary | ICD-10-CM | POA: Diagnosis not present

## 2016-08-13 DIAGNOSIS — Z8673 Personal history of transient ischemic attack (TIA), and cerebral infarction without residual deficits: Secondary | ICD-10-CM

## 2016-08-13 DIAGNOSIS — M48062 Spinal stenosis, lumbar region with neurogenic claudication: Secondary | ICD-10-CM | POA: Diagnosis not present

## 2016-08-13 DIAGNOSIS — E785 Hyperlipidemia, unspecified: Secondary | ICD-10-CM | POA: Diagnosis not present

## 2016-08-13 DIAGNOSIS — R338 Other retention of urine: Secondary | ICD-10-CM | POA: Diagnosis not present

## 2016-08-13 NOTE — Progress Notes (Signed)
Carelink Summary Report / Loop Recorder 

## 2016-08-13 NOTE — Progress Notes (Signed)
: Provider:  Randon GoldsmithAnne D. Lyn HollingsheadAlexander, MD [facility]  [facility] Room Number: 106P Place of Service:  SNF (31)  PCP: Pearla DubonnetGATES,[name], MD Patient Care Team: Marden Nobleobert Gates, MD as PCP - General (Internal Medicine) [facility]  Extended Emergency Contact Information Primary Emergency Contact: Lala LundBarnes,B J Address: [address]          [place], [place] [address] [place] Home Phone: [phone] Work Phone: [phone] Mobile Phone: [phone] Relation: Son Secondary Emergency Contact: Collier SalinaBarnes,Dena          [place], [place] [address] [place] Home Phone: [phone] Mobile Phone: [phone] Relation: Relative     Allergies: Ace inhibitors; Lipitor [atorvastatin]; and Motrin [ibuprofen]  Chief Complaint  Patient presents with  . New Admit To SNF    Admit to Facility    HPI: Patient is 83 y.o. female with CVA in 2016 with left-sided deficits that are persistent and associated with left lower extremity spasticity, hypertension, hypothyroidism, atrial fibrillation on eliquis, spinal stenosis with neurogenic claudication, dyslipidemia, GERD and insomnia. Last seen normal around 5:30 yesterday evening. Caregiver at the home noted this morning patient has had 2-3 episodes of dysarthria associated right facial drooping. In addition, patient was attempting to use the restroom and upon standing to try to pull up her undergarments she slid to the floor. She denied dizziness. She has had recent issues with worsening spasticity of the left lower extremity over the past several days. EMS was called to the home and by the time they arrived the symptoms had resolved. Upon patient to the ER symptoms had recurred. Code stroke was initiated. Neurology has seen the patient and deemed her not appropriate for TPA based on severity of symptoms and concurrent utilization of eliquis anticoagulation. Imaging consistent  with likely left MCA territory CVA. [facility] where pt's risk facors were identified and tx started. Hospital course was complicated by acute urinary retention which according to family occurred during prior stroke as well. Pt is admitted to SNF for OT/PT. While at SNF pt will be followed for HTN, tx wth lopressor, hypothyroidism, tx with synthroid and PAF, tx with lopressor and eliquis and ASA.  Past Medical History:  Diagnosis Date  . Arthritis   . Atrial fibrillation (HCC)   . Chronic neck pain   . DJD (degenerative joint disease), cervical   . DJD (degenerative joint disease), lumbar   . Dysphagia   . GERD (gastroesophageal reflux disease)   . Glaucoma   . HTN (hypertension) 10/30/2012  . Hyperlipidemia   . Hypothyroidism   . Insomnia   . Nocturia   . Right bundle branch block 03/21/2014  . Rotoscoliosis   . Severe hearing loss   . Shortness of breath dyspnea   . Stroke (HCC)   . Unspecified hypothyroidism 10/30/2012    Past Surgical History:  Procedure Laterality Date  . BACK SURGERY     lower back   . bladder laser surgery    . BREAST SURGERY    . cataract surgery    . EP IMPLANTABLE DEVICE N/A 01/19/2015   Procedure: Loop Recorder Insertion;  Surgeon: Marinus MawGregg W Taylor, MD;  [facility];  Service: Cardiovascular;  Laterality: N/A;  . ESOPHAGOGASTRODUODENOSCOPY (EGD) WITH ESOPHAGEAL DILATION  06/08/2012   Procedure: ESOPHAGOGASTRODUODENOSCOPY (EGD) WITH ESOPHAGEAL DILATION;  Surgeon: Charolett BumpersMartin K Johnson, MD;  Location: [facility] ENDOSCOPY;  Service: Endoscopy;  Laterality: N/A;  .  facail surgery  2000   facelift and eyelid lift  . left carpal tunnel surgery    . LUMBAR LAMINECTOMY/DECOMPRESSION MICRODISCECTOMY N/A 01/17/2015   Procedure: Laminectomy and Foraminotomy - L2-L3 - L3-L4;  Surgeon: [name], MD;  Location: [facility] NEURO ORS;  Service: Neurosurgery;  Laterality: N/A;  Laminectomy and Foraminotomy - L2-L3 - L3-L4  . TEE WITHOUT  CARDIOVERSION N/A 01/19/2015   Procedure: TRANSESOPHAGEAL ECHOCARDIOGRAM (TEE);  Surgeon: Chrystie Nose, MD;  Location: [facility] ENDOSCOPY;  Service: Cardiovascular;  Laterality: N/A;    Allergies as of 08/13/2016      Reactions   Ace Inhibitors Other (See Comments)   Severe fatigue   Lipitor [atorvastatin] Other (See Comments)   Mood swings   Motrin [ibuprofen] Nausea And Vomiting      Medication List       Accurate as of 08/13/16 10:19 AM. Always use your most recent med list.          apixaban 5 MG Tabs tablet Commonly known as:  ELIQUIS Take 1 tablet (5 mg total) by mouth 2 (two) times daily.   aspirin 81 MG EC tablet Take 1 tablet (81 mg total) by mouth daily.   baclofen 10 MG tablet Commonly known as:  LIORESAL Take 0.5 tablets (5 mg total) by mouth 3 (three) times daily.   dorzolamide-timolol 22.3-6.8 MG/ML ophthalmic solution Commonly known as:  COSOPT Place 1 drop into both eyes 2 (two) times daily.   ENSURE CLEAR Liqd Take 237 mLs by mouth 2 (two) times daily between meals.   gabapentin 100 MG capsule Commonly known as:  NEURONTIN Take 100 mg by mouth 2 (two) times daily. At breakfast and lunch   HYDROcodone-acetaminophen 5-325 MG tablet Commonly known as:  NORCO/VICODIN Take 1 tablet by mouth every 6 (six) hours as needed for moderate pain or severe pain. Take one tablet by mouth at night as needed and take 1 tablet twice daily as needed for pain   HYDROcodone-acetaminophen 5-325 MG tablet Commonly known as:  NORCO/VICODIN Take 1 tablet by mouth 2 (two) times daily as needed for moderate pain or severe pain.   latanoprost 0.005 % ophthalmic solution Commonly known as:  XALATAN Place 1 drop into both eyes at bedtime.   levothyroxine 88 MCG tablet Commonly known as:  SYNTHROID, LEVOTHROID Take 88 mcg by mouth daily before breakfast.   lidocaine 5 % Commonly known as:  LIDODERM Place 1 patch onto the skin daily. Left lateral thigh  Remove & Discard patch  within 12 hours or as directed by MD   metoprolol tartrate 25 MG tablet Commonly known as:  LOPRESSOR Take 25 mg by mouth 2 (two) times daily.   mirtazapine 15 MG tablet Commonly known as:  REMERON Take 15 mg by mouth at bedtime.   pantoprazole 40 MG tablet Commonly known as:  PROTONIX Take 40 mg by mouth 2 (two) times daily.   pravastatin 20 MG tablet Commonly known as:  PRAVACHOL Take 1 tablet (20 mg total) by mouth daily at 6 PM.   tamsulosin 0.4 MG Caps capsule Commonly known as:  FLOMAX Take 0.4 mg by mouth daily.   TYLENOL 325 MG tablet Generic drug:  acetaminophen Take 650 mg by mouth every 6 (six) hours as needed for mild pain or moderate pain.   vitamin B-12 1000 MCG tablet Commonly known as:  CYANOCOBALAMIN Take 1,000 mcg by mouth daily.       Meds ordered this encounter  Medications  . vitamin B-12 (CYANOCOBALAMIN)  1000 MCG tablet    Sig: Take 1,000 mcg by mouth daily.    Immunization History  Administered Date(s) Administered  . PPD Test 12/26/2015, 08/09/2016    Social History  Substance Use Topics  . Smoking status: Never Smoker  . Smokeless tobacco: Never Used  . Alcohol use No    Family history is   Family History  Problem Relation Age of Onset  . CAD Father   . Stroke Father   . Diabetes Mother   . Lung cancer Other   . CAD Brother       Review of Systems  DATA OBTAINED: from  family members GENERAL:  no fevers, pt is sleeping all the time 2/2 baclofen SKIN: No itching, or rash EYES: No eye pain, redness, discharge EARS: No earache, tinnitus, change in hearing NOSE: No congestion, drainage or bleeding  MOUTH/THROAT: No mouth or tooth pain, No sore throat RESPIRATORY: No cough, wheezing, SOB CARDIAC: No chest pain, palpitations, lower extremity edema  GI: No abdominal pain, No N/V/D or constipation, No heartburn or reflux  GU: No dysuria, frequency or urgency, or incontinence  MUSCULOSKELETAL: No unrelieved bone/joint pain; L  legf spasms for which pt is taking baclofen has stopped NEUROLOGIC: No headache, dizziness or nre  focal weakness PSYCHIATRIC: No c/o anxiety or sadness   Vitals:   08/13/16 1003  BP: (!) 167/87  Pulse: 82  Resp: 20  Temp: 98.2 F (36.8 C)    SpO2 Readings from Last 1 Encounters:  08/13/16 98%   Body mass index is 24.33 kg/m.     Physical Exam  GENERAL APPEARANCE: very sleepy, non conversant but does look me in the yes and acknowledges me,  No acute distress.  SKIN: No diaphoresis rash HEAD: Normocephalic, atraumatic  EYES: Conjunctiva/lids clear. Pupils round, reactive. EOMs intact.  EARS: External exam WNL, canals clear. Hearing grossly normal.  NOSE: No deformity or discharge.  MOUTH/THROAT: Lips w/o lesions  RESPIRATORY: Breathing is even, unlabored. Lung sounds are clear   CARDIOVASCULAR: Heart irreg no murmurs, rubs or gallops. No peripheral edema.   GASTROINTESTINAL: Abdomen is soft, non-tender, not distended w/ normal bowel sounds. GENITOURINARY: Bladder non tender, not distended  MUSCULOSKELETAL: No abnormal joints or musculature NEUROLOGIC:  Cranial nerves 2-12 grossly intact; R side facial weakness, new, L side weakness, old PSYCHIATRIC: pt sleepy  Patient Active Problem List   Diagnosis Date Noted  . Somnolence, daytime   . Acute ischemic left MCA stroke (HCC) 08/06/2016  . Acute urinary retention 08/06/2016  . Aphasia   . Depression 02/03/2016  . Insomnia 02/03/2016  . Chronic musculoskeletal pain 02/03/2016  . GERD (gastroesophageal reflux disease) 12/31/2015  . Pressure ulcer 12/23/2015  . Atrial fibrillation (HCC) 06/06/2015  . Embolic stroke (HCC) 03/22/2015  . Left hemiparesis (HCC)   . CVA (cerebral vascular accident) (HCC)   . H/O TIA (transient ischemic attack) and stroke   . Spinal stenosis, lumbar region, with neurogenic claudication 01/17/2015  . Lumbar stenosis 01/17/2015  . 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 Essential  hypertension 11/10/2014  . Chronic back pain 11/08/2014  . Cerebral infarction due to thrombosis of cerebral artery (HCC)   . Hyperlipidemia LDL goal <70 09/07/2014  . Presumed CVA (cerebral infarction) s/p IV tPA, MRI negative 09/05/2014  . Hypertensive urgency 09/05/2014  . Right bundle branch block 03/21/2014  . Cough 10/31/2012  . Elevated lipase 10/30/2012  . HTN (hypertension) 10/30/2012  . Hypothyroidism 10/30/2012      Labs reviewed: Basic  Metabolic Panel:    Component Value Date/Time   NA 138 08/08/2016 1216   NA 145 08/06/2016 0812   K 4.2 08/08/2016 1216   CL 102 08/08/2016 1216   CO2 25 08/08/2016 1216   GLUCOSE 95 08/08/2016 1216   BUN 14 08/08/2016 1216   BUN 19 08/06/2016 0812   CREATININE 0.78 08/08/2016 1216   CALCIUM 9.2 08/08/2016 1216   PROT 7.5 08/06/2016 0759   ALBUMIN 3.9 08/06/2016 0759   AST 22 08/06/2016 0759   ALT 18 08/06/2016 0759   ALKPHOS 65 08/06/2016 0759   BILITOT 0.3 08/06/2016 0759   GFRNONAA >60 08/08/2016 1216   GFRAA >60 08/08/2016 1216     Recent Labs  12/24/15 1025  08/06/16 0759 08/06/16 0812 08/08/16 1216  NA 140  < > 142  142 145  145 138  K 3.7  < > 4.0  4.0 3.9  3.9 4.2  CL 108  --  107 103 102  CO2 28  --  31  --  25  GLUCOSE 129*  --  105* 103* 95  BUN 11  < > 15  15 19  19 14   CREATININE 0.78  < > 0.93  0.9 1.00  1.0 0.78  CALCIUM 8.5*  --  9.7  --  9.2  < > = values in this interval not displayed. Liver Function Tests:  Recent Labs  12/22/15 1654 12/23/15 0516 08/06/16 0759  AST 42* 33 22  ALT 23 19 18   ALKPHOS 62 51 65  BILITOT 0.8 1.0 0.3  PROT 6.9 6.1* 7.5  ALBUMIN 4.1 3.4* 3.9   No results for input(s): LIPASE, AMYLASE in the last 8760 hours. No results for input(s): AMMONIA in the last 8760 hours. CBC:  Recent Labs  12/22/15 1654  12/23/15 0516 01/08/16 08/06/16 0759 08/06/16 0812  WBC 9.4  < > 6.9 5.0 6.6  6.6  --   NEUTROABS 6.9  --   --   --  3.3  --   HGB 13.2  --   11.9* 12.1 12.8  12.8 14.3  HCT 40.3  --  36.0 38 40.3  40 42.0  MCV 87.2  --  86.1  --  90.0  --   PLT 283  --  243 244 246  246  --   < > = values in this interval not displayed. Lipid  Recent Labs  08/07/16 0715  CHOL 180  HDL 54  LDLCALC 118*  TRIG 40    Cardiac Enzymes:  Recent Labs  12/22/15 1654 12/23/15 0516 12/24/15 1025  CKTOTAL 1,107* 608* 284*   BNP: No results for input(s): BNP in the last 8760 hours. No results found for: [facility] Lab Results  Component Value Date   HGBA1C 5.8 (H) 08/07/2016   Lab Results  Component Value Date   TSH 0.690 12/23/2015   No results found for: VITAMINB12 No results found for: FOLATE No results found for: IRON, TIBC, FERRITIN  Imaging and Procedures obtained prior to SNF admission: Ct Angio Head W Or Wo Contrast  Result Date: 08/06/2016 CLINICAL DATA:  83 year old female with right facial droop and aphasia. Code stroke. Personal history of distal right ACA infarct and intracranial atherosclerosis. Initial encounter. EXAM: CT PERFUSION BRAIN CT ANGIOGRAPHY HEAD AND NECK TECHNIQUE: Multiphase CT imaging of the brain was performed following IV bolus contrast injection. Subsequent parametric perfusion maps were calculated using RAPID software. Multidetector CT imaging of the head and neck was performed using the standard protocol  during bolus administration of intravenous contrast. Multiplanar CT image reconstructions and MIPs were obtained to evaluate the vascular anatomy. Carotid stenosis measurements (when applicable) are obtained utilizing NASCET criteria, using the distal internal carotid diameter as the denominator. CONTRAST:  100 mL Isovue 370 COMPARISON:  Head CT without contrast today 0815 hours. Brain MRI and intracranial MRA 01/18/2015. FINDINGS: CT BRAIN PERFUSION FINDINGS CBF (<30%) Volume:  0 mL Perfusion (Tmax>6.0s) volume: 4 mL (likely artifactual) Mismatch Volume: Not applicable Infarction Location: Not applicable  CTA NECK FINDINGS Skeleton: Advanced degenerative changes in the cervical spine. No acute osseous abnormality identified. Upper chest: No acute apical lung findings aside from mild atelectasis. No superior mediastinal lymphadenopathy. Other neck: Diminutive or absent thyroid. Negative larynx, pharynx, parapharyngeal spaces, retropharyngeal space, sublingual space, submandibular glands and parotid glands. No cervical lymphadenopathy. Aortic arch: 3 vessel arch configuration. Mild to moderate mostly distal arch calcified atherosclerosis. Right carotid system: No brachiocephalic artery stenosis. Tortuous right CCA origin and proximal right CCA with a kinked appearance but no other stenosis. Minimal circumferential soft plaque in the right CCA. At the right carotid bifurcation there is mostly calcified plaque without stenosis. Mildly tortuous otherwise negative cervical right ICA. Left carotid system: Minimal plaque at the left CCA origin without stenosis. Tortuous left CCA. No stenosis proximal to the bifurcation. Mild calcified plaque at the bifurcation without stenosis. Mildly tortuous cervical left ICA. Vertebral arteries: Calcified plaque and tortuosity at the proximal right subclavian artery origin. Normal right vertebral artery origin. Tortuous but otherwise negative cervical right vertebral artery. Calcified plaque in the proximal left subclavian artery without stenosis. Normal left vertebral artery origin. Tortuous left V1 segment. Tortuous left V2 and V3 segments with otherwise negative cervical left vertebral artery. CTA HEAD Posterior circulation: Both distal vertebral arteries taper in the V4 segments without focal stenosis. Both PICA origins are patent. Patent vertebrobasilar junction. No basilar stenosis. Normal SCA and left PCA origins. Mild to moderate irregularity of the right PCA origin and proximal P1 segment with stenosis. Similar right P2 segment findings (series 507, image 19). Mild left PCA  irregularity. Moderate distal left PCA (P3 segment irregularity and stenosis (series 506, image 33). Posterior communicating arteries are diminutive or absent. Anterior circulation: Both ICA siphons are patent. There is moderate left siphon calcified plaque without significant stenosis. Similar moderate calcified plaque throughout the right ICA siphon with no significant stenosis. Patent carotid termini, MCA and ACA origins. The left ACA and a small anterior communicating artery are within normal limits. There is mild to moderate right A1 segment irregularity, and the right A2 segment is occluded proximal to the pericallosal artery level. There is mild to moderate distal left ACA branch irregularity (series 506, image 30). Right MCA M1 segment is mildly irregular. There is up to moderate mid right M1 segment irregularity and stenosis as seen on series 505, image 18 which is stable from the 2016 MRA. The right MCA bifurcation is patent. There is moderate right M2 branch irregularity and stenosis (series 506, image 22). The left mid M1 segment demonstrates moderate irregularity and stenosis (series 505, image 20 and series 504, image 176) which appears stable to the 2016 MRA. The left MCA bifurcation remains patent. There is only mild left M2 branch irregularity and stenosis. Venous sinuses: Patent. Anatomic variants: None. Review of the MIP images confirms the above findings IMPRESSION: 1. CT Perfusion is negative for evidence of core infarct and CTA is negative for emergent large vessel occlusion. A text page with preliminary findings of  negative ELVO was sent to [facility] on 08/06/2016 at 0832 hours. 2. Generalized arterial tortuosity in the neck with mild atherosclerosis and no stenosis. 3. More moderate to severe chronic intracranial atherosclerosis and stenosis with chronic occlusion of the distal right ACA. Significant proximal stenoses which appear stable to the 2016 intracranial MRA include -  Moderate bilateral MCA M1 segment stenoses, and - Moderate Right P1 and P2 segments stenoses. 4. Up to moderate distal branch irregularity and stenosis in the left PCA, left ACA, and right greater than left MCA territories. Electronically Signed   By: Odessa Fleming M.D.   On: 08/06/2016 09:06   Ct Angio Neck W Or Wo Contrast  Result Date: 08/06/2016 CLINICAL DATA:  83 year old female with right facial droop and aphasia. Code stroke. Personal history of distal right ACA infarct and intracranial atherosclerosis. Initial encounter. EXAM: CT PERFUSION BRAIN CT ANGIOGRAPHY HEAD AND NECK TECHNIQUE: Multiphase CT imaging of the brain was performed following IV bolus contrast injection. Subsequent parametric perfusion maps were calculated using RAPID software. Multidetector CT imaging of the head and neck was performed using the standard protocol during bolus administration of intravenous contrast. Multiplanar CT image reconstructions and MIPs were obtained to evaluate the vascular anatomy. Carotid stenosis measurements (when applicable) are obtained utilizing NASCET criteria, using the distal internal carotid diameter as the denominator. CONTRAST:  100 mL Isovue 370 COMPARISON:  Head CT without contrast today 0815 hours. Brain MRI and intracranial MRA 01/18/2015. FINDINGS: CT BRAIN PERFUSION FINDINGS CBF (<30%) Volume:  0 mL Perfusion (Tmax>6.0s) volume: 4 mL (likely artifactual) Mismatch Volume: Not applicable Infarction Location: Not applicable CTA NECK FINDINGS Skeleton: Advanced degenerative changes in the cervical spine. No acute osseous abnormality identified. Upper chest: No acute apical lung findings aside from mild atelectasis. No superior mediastinal lymphadenopathy. Other neck: Diminutive or absent thyroid. Negative larynx, pharynx, parapharyngeal spaces, retropharyngeal space, sublingual space, submandibular glands and parotid glands. No cervical lymphadenopathy. Aortic arch: 3 vessel arch configuration. Mild to  moderate mostly distal arch calcified atherosclerosis. Right carotid system: No brachiocephalic artery stenosis. Tortuous right CCA origin and proximal right CCA with a kinked appearance but no other stenosis. Minimal circumferential soft plaque in the right CCA. At the right carotid bifurcation there is mostly calcified plaque without stenosis. Mildly tortuous otherwise negative cervical right ICA. Left carotid system: Minimal plaque at the left CCA origin without stenosis. Tortuous left CCA. No stenosis proximal to the bifurcation. Mild calcified plaque at the bifurcation without stenosis. Mildly tortuous cervical left ICA. Vertebral arteries: Calcified plaque and tortuosity at the proximal right subclavian artery origin. Normal right vertebral artery origin. Tortuous but otherwise negative cervical right vertebral artery. Calcified plaque in the proximal left subclavian artery without stenosis. Normal left vertebral artery origin. Tortuous left V1 segment. Tortuous left V2 and V3 segments with otherwise negative cervical left vertebral artery. CTA HEAD Posterior circulation: Both distal vertebral arteries taper in the V4 segments without focal stenosis. Both PICA origins are patent. Patent vertebrobasilar junction. No basilar stenosis. Normal SCA and left PCA origins. Mild to moderate irregularity of the right PCA origin and proximal P1 segment with stenosis. Similar right P2 segment findings (series 507, image 19). Mild left PCA irregularity. Moderate distal left PCA (P3 segment irregularity and stenosis (series 506, image 33). Posterior communicating arteries are diminutive or absent. Anterior circulation: Both ICA siphons are patent. There is moderate left siphon calcified plaque without significant stenosis. Similar moderate calcified plaque throughout the right ICA siphon with no significant stenosis. Patent  carotid termini, MCA and ACA origins. The left ACA and a small anterior communicating artery are  within normal limits. There is mild to moderate right A1 segment irregularity, and the right A2 segment is occluded proximal to the pericallosal artery level. There is mild to moderate distal left ACA branch irregularity (series 506, image 30). Right MCA M1 segment is mildly irregular. There is up to moderate mid right M1 segment irregularity and stenosis as seen on series 505, image 18 which is stable from the 2016 MRA. The right MCA bifurcation is patent. There is moderate right M2 branch irregularity and stenosis (series 506, image 22). The left mid M1 segment demonstrates moderate irregularity and stenosis (series 505, image 20 and series 504, image 176) which appears stable to the 2016 MRA. The left MCA bifurcation remains patent. There is only mild left M2 branch irregularity and stenosis. Venous sinuses: Patent. Anatomic variants: None. Review of the MIP images confirms the above findings IMPRESSION: 1. CT Perfusion is negative for evidence of core infarct and CTA is negative for emergent large vessel occlusion. A text page with preliminary findings of negative ELVO was sent to [facility] on 08/06/2016 at 0832 hours. 2. Generalized arterial tortuosity in the neck with mild atherosclerosis and no stenosis. 3. More moderate to severe chronic intracranial atherosclerosis and stenosis with chronic occlusion of the distal right ACA. Significant proximal stenoses which appear stable to the 2016 intracranial MRA include - Moderate bilateral MCA M1 segment stenoses, and - Moderate Right P1 and P2 segments stenoses. 4. Up to moderate distal branch irregularity and stenosis in the left PCA, left ACA, and right greater than left MCA territories. Electronically Signed   By: Odessa Fleming M.D.   On: 08/06/2016 09:06   Mr Maxine Glenn Head Wo Contrast  Result Date: 08/06/2016 CLINICAL DATA:  83 y/o F; slurred speech and right facial drooping. Leftward gaze. EXAM: MRI HEAD WITHOUT CONTRAST MRA HEAD WITHOUT CONTRAST TECHNIQUE:  Multiplanar, multiecho pulse sequences of the brain and surrounding structures were obtained without intravenous contrast. Angiographic images of the head were obtained using MRA technique without contrast. COMPARISON:  CT head, CT angiogram head, and CT cerebral perfusion dated 08/06/2016. MRI brain and MRA head dated 01/18/2015. FINDINGS: MRI HEAD FINDINGS Brain: 12 x 7 mm focus of diffusion restriction (AP by ML series 3, image 30 series 300, image 30) centered within the left posterior limb of internal capsule consistent with acute/ early subacute infarction. Chronic distal right ACA distribution infarction with laminar necrosis. Chronic infarct within the left lentiform nucleus. Stable background of chronic microvascular ischemic changes and parenchymal volume loss of the brain. No new susceptibility hypointensity to indicate interval intracranial hemorrhage. Vascular: As below. Skull and upper cervical spine: Choose Sinuses/Orbits: Mild right maxillary sinus mucosal thickening and ethmoid sinus mucosal thickening. Bilateral intra-ocular lens replacement. Trace left mastoid tip effusion. Otherwise negative. Other: None. MRA HEAD FINDINGS Moderate motion artifact, suboptimal evaluation for subtle stenosis or aneurysm. No gross interval change from prior CT angiogram with persistent chronic occlusion of the distal right anterior cerebral artery and intracranial atherosclerosis with areas of mild-to-moderate stenosis most pronounced and bilateral M1 and P1 segments. No proximal large vessel occlusion of the bilateral middle cerebral arteries, left anterior cerebral artery, bilateral posterior cerebral arteries, vertebral arteries, internal carotid arteries, or the basilar artery. IMPRESSION: 1. Small acute/ early subacute infarct within the left posterior limb of internal capsule. No acute hemorrhage identified. 2. Stable chronic distal right ACA and left lentiform nuclei infarcts.  3. Stable moderate chronic  microvascular ischemic changes and parenchymal volume loss of the brain. 4. Mild paranasal sinus disease. 5. Motion degraded time-of-flight MRA. No gross interval change from prior CT angiogram. No acute proximal large vessel occlusion. These results will be called to the ordering clinician or representative by the Radiologist Assistant, and communication documented in the PACS or zVision Dashboard. Electronically Signed   By: Mitzi Hansen M.D.   On: 08/06/2016 15:09   Mr Brain Wo Contrast  Result Date: 08/06/2016 CLINICAL DATA:  83 y/o F; slurred speech and right facial drooping. Leftward gaze. EXAM: MRI HEAD WITHOUT CONTRAST MRA HEAD WITHOUT CONTRAST TECHNIQUE: Multiplanar, multiecho pulse sequences of the brain and surrounding structures were obtained without intravenous contrast. Angiographic images of the head were obtained using MRA technique without contrast. COMPARISON:  CT head, CT angiogram head, and CT cerebral perfusion dated 08/06/2016. MRI brain and MRA head dated 01/18/2015. FINDINGS: MRI HEAD FINDINGS Brain: 12 x 7 mm focus of diffusion restriction (AP by ML series 3, image 30 series 300, image 30) centered within the left posterior limb of internal capsule consistent with acute/ early subacute infarction. Chronic distal right ACA distribution infarction with laminar necrosis. Chronic infarct within the left lentiform nucleus. Stable background of chronic microvascular ischemic changes and parenchymal volume loss of the brain. No new susceptibility hypointensity to indicate interval intracranial hemorrhage. Vascular: As below. Skull and upper cervical spine: Choose Sinuses/Orbits: Mild right maxillary sinus mucosal thickening and ethmoid sinus mucosal thickening. Bilateral intra-ocular lens replacement. Trace left mastoid tip effusion. Otherwise negative. Other: None. MRA HEAD FINDINGS Moderate motion artifact, suboptimal evaluation for subtle stenosis or aneurysm. No gross interval  change from prior CT angiogram with persistent chronic occlusion of the distal right anterior cerebral artery and intracranial atherosclerosis with areas of mild-to-moderate stenosis most pronounced and bilateral M1 and P1 segments. No proximal large vessel occlusion of the bilateral middle cerebral arteries, left anterior cerebral artery, bilateral posterior cerebral arteries, vertebral arteries, internal carotid arteries, or the basilar artery. IMPRESSION: 1. Small acute/ early subacute infarct within the left posterior limb of internal capsule. No acute hemorrhage identified. 2. Stable chronic distal right ACA and left lentiform nuclei infarcts. 3. Stable moderate chronic microvascular ischemic changes and parenchymal volume loss of the brain. 4. Mild paranasal sinus disease. 5. Motion degraded time-of-flight MRA. No gross interval change from prior CT angiogram. No acute proximal large vessel occlusion. These results will be called to the ordering clinician or representative by the Radiologist Assistant, and communication documented in the PACS or zVision Dashboard. Electronically Signed   By: Mitzi Hansen M.D.   On: 08/06/2016 15:09   US Renal  Result Date: 08/08/2016 CLINICAL DATA:  Bladder outlet obstruction. EXAM: RENAL / URINARY TRACT ULTRASOUND COMPLETE COMPARISON:  CT 10/30/2012 FINDINGS: Right Kidney: Length: 9.2 cm. Echogenicity within normal limits. No mass or hydronephrosis visualized. Left Kidney: Technically limited evaluation. Length: 9.5 cm. Echogenicity within normal limits. No mass or hydronephrosis visualized. Bladder: Decompressed by Foley catheter and not evaluated. IMPRESSION: No hydronephrosis. Bladder decompressed by Foley catheter and not evaluated. Electronically Signed   By: Rubye Oaks M.D.   On: 08/08/2016 01:04   Ct Cerebral Perfusion W Contrast  Result Date: 08/06/2016 CLINICAL DATA:  83 year old female with right facial droop and aphasia. Code stroke.  Personal history of distal right ACA infarct and intracranial atherosclerosis. Initial encounter. EXAM: CT PERFUSION BRAIN CT ANGIOGRAPHY HEAD AND NECK TECHNIQUE: Multiphase CT imaging of the brain was performed following IV  bolus contrast injection. Subsequent parametric perfusion maps were calculated using RAPID software. Multidetector CT imaging of the head and neck was performed using the standard protocol during bolus administration of intravenous contrast. Multiplanar CT image reconstructions and MIPs were obtained to evaluate the vascular anatomy. Carotid stenosis measurements (when applicable) are obtained utilizing NASCET criteria, using the distal internal carotid diameter as the denominator. CONTRAST:  100 mL Isovue 370 COMPARISON:  Head CT without contrast today 0815 hours. Brain MRI and intracranial MRA 01/18/2015. FINDINGS: CT BRAIN PERFUSION FINDINGS CBF (<30%) Volume:  0 mL Perfusion (Tmax>6.0s) volume: 4 mL (likely artifactual) Mismatch Volume: Not applicable Infarction Location: Not applicable CTA NECK FINDINGS Skeleton: Advanced degenerative changes in the cervical spine. No acute osseous abnormality identified. Upper chest: No acute apical lung findings aside from mild atelectasis. No superior mediastinal lymphadenopathy. Other neck: Diminutive or absent thyroid. Negative larynx, pharynx, parapharyngeal spaces, retropharyngeal space, sublingual space, submandibular glands and parotid glands. No cervical lymphadenopathy. Aortic arch: 3 vessel arch configuration. Mild to moderate mostly distal arch calcified atherosclerosis. Right carotid system: No brachiocephalic artery stenosis. Tortuous right CCA origin and proximal right CCA with a kinked appearance but no other stenosis. Minimal circumferential soft plaque in the right CCA. At the right carotid bifurcation there is mostly calcified plaque without stenosis. Mildly tortuous otherwise negative cervical right ICA. Left carotid system: Minimal  plaque at the left CCA origin without stenosis. Tortuous left CCA. No stenosis proximal to the bifurcation. Mild calcified plaque at the bifurcation without stenosis. Mildly tortuous cervical left ICA. Vertebral arteries: Calcified plaque and tortuosity at the proximal right subclavian artery origin. Normal right vertebral artery origin. Tortuous but otherwise negative cervical right vertebral artery. Calcified plaque in the proximal left subclavian artery without stenosis. Normal left vertebral artery origin. Tortuous left V1 segment. Tortuous left V2 and V3 segments with otherwise negative cervical left vertebral artery. CTA HEAD Posterior circulation: Both distal vertebral arteries taper in the V4 segments without focal stenosis. Both PICA origins are patent. Patent vertebrobasilar junction. No basilar stenosis. Normal SCA and left PCA origins. Mild to moderate irregularity of the right PCA origin and proximal P1 segment with stenosis. Similar right P2 segment findings (series 507, image 19). Mild left PCA irregularity. Moderate distal left PCA (P3 segment irregularity and stenosis (series 506, image 33). Posterior communicating arteries are diminutive or absent. Anterior circulation: Both ICA siphons are patent. There is moderate left siphon calcified plaque without significant stenosis. Similar moderate calcified plaque throughout the right ICA siphon with no significant stenosis. Patent carotid termini, MCA and ACA origins. The left ACA and a small anterior communicating artery are within normal limits. There is mild to moderate right A1 segment irregularity, and the right A2 segment is occluded proximal to the pericallosal artery level. There is mild to moderate distal left ACA branch irregularity (series 506, image 30). Right MCA M1 segment is mildly irregular. There is up to moderate mid right M1 segment irregularity and stenosis as seen on series 505, image 18 which is stable from the 2016 MRA. The right  MCA bifurcation is patent. There is moderate right M2 branch irregularity and stenosis (series 506, image 22). The left mid M1 segment demonstrates moderate irregularity and stenosis (series 505, image 20 and series 504, image 176) which appears stable to the 2016 MRA. The left MCA bifurcation remains patent. There is only mild left M2 branch irregularity and stenosis. Venous sinuses: Patent. Anatomic variants: None. Review of the MIP images confirms the above findings IMPRESSION:  1. CT Perfusion is negative for evidence of core infarct and CTA is negative for emergent large vessel occlusion. A text page with preliminary findings of negative ELVO was sent to [facility] on 08/06/2016 at 0832 hours. 2. Generalized arterial tortuosity in the neck with mild atherosclerosis and no stenosis. 3. More moderate to severe chronic intracranial atherosclerosis and stenosis with chronic occlusion of the distal right ACA. Significant proximal stenoses which appear stable to the 2016 intracranial MRA include - Moderate bilateral MCA M1 segment stenoses, and - Moderate Right P1 and P2 segments stenoses. 4. Up to moderate distal branch irregularity and stenosis in the left PCA, left ACA, and right greater than left MCA territories. Electronically Signed   By: Odessa Fleming M.D.   On: 08/06/2016 09:06   Dg Chest Portable 1 View  Result Date: 08/06/2016 CLINICAL DATA:  Stroke symptoms EXAM: PORTABLE CHEST 1 VIEW COMPARISON:  02/07/2015 FINDINGS: Normal cardiac silhouette. Improvement aeration lung bases. Small LEFT effusion remains. Chronic bronchitic markings. Hiatal hernia noted. IMPRESSION: 1. Hiatal hernia and LEFT effusion. 2. No pulmonary edema or infiltrate. Electronically Signed   By: Genevive Bi M.D.   On: 08/06/2016 09:13   Ct Head Code Stroke W/o Cm  Result Date: 08/06/2016 CLINICAL DATA:  Code stroke. 83 year old female with aphasia and right facial droop. Initial encounter. EXAM: CT HEAD WITHOUT CONTRAST  TECHNIQUE: Contiguous axial images were obtained from the base of the skull through the vertex without intravenous contrast. COMPARISON:  12/22/2015 and earlier. FINDINGS: Brain: Sequelae of posterior right ACA infarct with encephalomalacia in the posterosuperior right frontal lobe and medial right parietal lobe. No acute intracranial hemorrhage identified. No midline shift, mass effect, or evidence of intracranial mass lesion. No ventriculomegaly. There is subtle loss of gray-white matter differentiation in the posterior left MCA territory such as on series 21, image 21 in the posterior frontal and parietal lobe. Stable either dilated perivascular space or chronic lacunar infarct at the inferior left basal ganglia. Vascular: Calcified atherosclerosis at the skull base. Suspicious hyperdensity at the left ICA terminus on coronal image 36. Skull: No acute osseous abnormality identified. Sinuses/Orbits: Visualized paranasal sinuses and mastoids are stable and well pneumatized. Other: No acute orbit or scalp soft tissue findings. ASPECTS [facility] Stroke Program Early CT Score) - Ganglionic level infarction (caudate, lentiform nuclei, internal capsule, insula, M1-M3 cortex): 7 - Supraganglionic infarction (M4-M6 cortex): 2 Total score (0-10 with 10 being normal): 9 IMPRESSION: 1. Suspicious hyperdensity at the left ICA terminus and suspect subtle loss of gray-white differentiation in the posterior left MCA territory. No acute hemorrhage or intracranial mass effect. 2. ASPECTS is 9. 3. Chronic posterior right ACA infarct. 4. Study discussed by telephone with Dr. Aram Beecham MESNER on 08/06/2016 at 0823 hours Electronically Signed   By: Odessa Fleming M.D.   On: 08/06/2016 08:29     Not all labs, radiology exams or other studies done during hospitalization come through on my EPIC note; however they are reviewed by me.    Assessment and Plan  ACUTE ISCHEMIC L MCA STROKE -  Presents with right facial droop  and dysarthria that has been waxing and waning -Pt is now s/p MRI brain with findings of small acute/early subacute infarct within the L posterior limb of the internal capsule -Echocardiogram done- LVH, EF 55-60%, normal systolic fx, grade 2 diastolic dysfunction -Underwent CTA head and neck so no indication for carotid duplex -Continue aspirin for antiplatelet -Lipid panel- LDL 118, HDL 54; hemoglobin A1c 5.8 -  SLP recs for dysphagia 2 with thin liquids - Pt noted to be more lethargic this AM and difficult to arouse. Discussed with Neurology who recommended STAT CT angio head/neck; nothing new; family thinks it was pt's baclofen; pt was not given baclofen over the weekend and pt was awake enough to do therapy SNF - admitted for OT/PT. Dur to family's concern baclofen has been d/c. Pt had some dark urine in bag; have asked for IVF because pt is to sleepy to eat  H/O TIA AND PRIOR EMBOLIC STROKE/L HEMIPARESIS - Immediate post stroke in 2016 patient had issues with significant left lower extremity spasticity which began recurring 2 days ago primarily at night -Pt was continued onpreadmission medications: Neurontin, baclofen, Lidoderm patch to left thigh SNF  - baclofen is being d/c  ACUTE URINARY RETENTION- Multiple episodes of bladder outlet obstruction -Family member at bedside reports patient had similar issues during previous stroke July 2016 -Renal US unremarkable. Suspect neurogenic bladder related to prior CVA -continue indwelling foley cath, recommend outpatient Urology follow up (Dr. Logan Bores) SNF -cont  flonax 0.4 mg   HTN - allowing for permissive HTN SNF - cont lopressor 25 mg BID  HYPOTHYROIDISM SNF - not reported as uncontrolled; cont synthroid 88 mcg daily  PAF - in NSR with first degree block and PAC's SNF - cont lopressor 25 mg BID; cont eliquis 5 mg BID and ASA 81 mg daily  HLD  SNF - pt not on statin at home;allergy to lipitor; pt has been on pravachol since 2/6 without  problems  SPINAL STENOSIS SNF- cont neurontin and  Norco; pt has been on baclofen, it is making her sleep all the time, it has been d/c  DEPRESSION  SNF - will decrease remeron from 15 mg to 7.5 mg to reduce liklihood of somnalence   Time spent > 45 min;> 50% of time with patient was spent reviewing records, labs, tests and studies, counseling and developing plan of care  Thurston Hole D. Lyn Hollingshead, MD

## 2016-08-14 LAB — CBC AND DIFFERENTIAL
HCT: 44 % (ref 36–46)
HEMOGLOBIN: 14 g/dL (ref 12.0–16.0)
PLATELETS: 268 10*3/uL (ref 150–399)
WBC: 7.4 10*3/mL

## 2016-08-14 LAB — BASIC METABOLIC PANEL
BUN: 23 mg/dL — AB (ref 4–21)
CREATININE: 0.7 mg/dL (ref 0.5–1.1)
Glucose: 108 mg/dL
POTASSIUM: 3.8 mmol/L (ref 3.4–5.3)
Sodium: 142 mmol/L (ref 137–147)

## 2016-08-23 ENCOUNTER — Non-Acute Institutional Stay (SKILLED_NURSING_FACILITY): Payer: Medicare Other | Admitting: Internal Medicine

## 2016-08-23 DIAGNOSIS — F05 Delirium due to known physiological condition: Secondary | ICD-10-CM

## 2016-08-23 DIAGNOSIS — I63512 Cerebral infarction due to unspecified occlusion or stenosis of left middle cerebral artery: Secondary | ICD-10-CM | POA: Diagnosis not present

## 2016-08-26 ENCOUNTER — Encounter: Payer: Self-pay | Admitting: Internal Medicine

## 2016-08-26 LAB — CUP PACEART REMOTE DEVICE CHECK
Implantable Pulse Generator Implant Date: 20160714
MDC IDC SESS DTM: 20180106030703

## 2016-08-26 NOTE — Progress Notes (Signed)
Location:  [facility] Room Number: 106P Place of Service:  SNF (31)  Randon Goldsmith. Lyn Hollingshead, MD  Patient Care Team: Marden Noble, MD as PCP - General (Internal Medicine) [facility])  Extended Emergency Contact Information Primary Emergency Contact: Lala Lund Address: [address]          [place], [place] [address] Darden Amber of [place] Home Phone: [phone] Work Phone: [phone] Mobile Phone: [phone] Relation: Son Secondary Emergency Contact: [place], [place] [address] Darden Amber of [place] Home Phone: [phone] Mobile Phone: [phone] Relation: Relative    Allergies: Ace inhibitors; Lipitor [atorvastatin]; and Motrin [ibuprofen]  Chief Complaint  Patient presents with  . Acute Visit    Acute    HPI: Patient is 83 y.o. female who nursing asked me to see acutely. Pt's family thinks she is sundowning as she gets more anxious in the afternoons and sometimes fearful. Pt can not really give me any reliable information.,  Past Medical History:  Diagnosis Date  . Arthritis   . Atrial fibrillation (HCC)   . Chronic neck pain   . DJD (degenerative joint disease), cervical   . DJD (degenerative joint disease), lumbar   . Dysphagia   . GERD (gastroesophageal reflux disease)   . Glaucoma   . HTN (hypertension) 10/30/2012  . Hyperlipidemia   . Hypothyroidism   . Insomnia   . Nocturia   . Right bundle branch block 03/21/2014  . Rotoscoliosis   . Severe hearing loss   . Shortness of breath dyspnea   . Stroke (HCC)   . Unspecified hypothyroidism 10/30/2012    Past Surgical History:  Procedure Laterality Date  . BACK SURGERY     lower back   . bladder laser surgery    . BREAST SURGERY    . cataract surgery    . EP IMPLANTABLE DEVICE N/A 01/19/2015   Procedure: Loop Recorder Insertion;  Surgeon: Marinus Maw, MD;  Location: [facility] INVASIVE CV LAB;  Service: Cardiovascular;   Laterality: N/A;  . ESOPHAGOGASTRODUODENOSCOPY (EGD) WITH ESOPHAGEAL DILATION  06/08/2012   Procedure: ESOPHAGOGASTRODUODENOSCOPY (EGD) WITH ESOPHAGEAL DILATION;  Surgeon: Charolett Bumpers, MD;  Location: [facility] ENDOSCOPY;  Service: Endoscopy;  Laterality: N/A;  . facail surgery  2000   facelift and eyelid lift  . left carpal tunnel surgery    . LUMBAR LAMINECTOMY/DECOMPRESSION MICRODISCECTOMY N/A 01/17/2015   Procedure: Laminectomy and Foraminotomy - L2-L3 - L3-L4;  Surgeon: [name], MD;  Location: [facility] NEURO ORS;  Service: Neurosurgery;  Laterality: N/A;  Laminectomy and Foraminotomy - L2-L3 - L3-L4  . TEE WITHOUT CARDIOVERSION N/A 01/19/2015   Procedure: TRANSESOPHAGEAL ECHOCARDIOGRAM (TEE);  Surgeon: Chrystie Nose, MD;  Location: [facility] ENDOSCOPY;  Service: Cardiovascular;  Laterality: N/A;    Allergies as of 08/23/2016      Reactions   Ace Inhibitors Other (See Comments)   Severe fatigue   Lipitor [atorvastatin] Other (See Comments)   Mood swings   Motrin [ibuprofen] Nausea And Vomiting      Medication List       Accurate as of 08/23/16 11:59 PM. Always use your most recent med list.          apixaban 5 MG Tabs tablet Commonly known as:  ELIQUIS Take 1 tablet (5 mg total) by mouth 2 (two) times daily.   aspirin 81 MG EC tablet Take 1 tablet (81 mg total) by mouth daily.  dorzolamide-timolol 22.3-6.8 MG/ML ophthalmic solution Commonly known as:  COSOPT Place 1 drop into both eyes 2 (two) times daily.   gabapentin 100 MG capsule Commonly known as:  NEURONTIN Take 100 mg by mouth 2 (two) times daily. At breakfast and lunch   HYDROcodone-acetaminophen 5-325 MG tablet Commonly known as:  NORCO/VICODIN Take 1 tablet by mouth 2 (two) times daily as needed for moderate pain or severe pain.   latanoprost 0.005 % ophthalmic solution Commonly known as:  XALATAN Place 1 drop into both eyes at bedtime.   levothyroxine 88 MCG tablet Commonly known as:  SYNTHROID, LEVOTHROID Take  88 mcg by mouth daily before breakfast.   lidocaine 5 % Commonly known as:  LIDODERM Place 1 patch onto the skin daily. Left lateral thigh  Remove & Discard patch within 12 hours or as directed by MD   metoprolol tartrate 25 MG tablet Commonly known as:  LOPRESSOR Take 25 mg by mouth 2 (two) times daily.   mirtazapine 7.5 MG tablet Commonly known as:  REMERON Take 7.5 mg by mouth at bedtime.   NUTRITIONAL SUPPLEMENTS PO Take Medpass 4 oz by mouth 2 times daily to support nutritional needs   pantoprazole 40 MG tablet Commonly known as:  PROTONIX Take 40 mg by mouth 2 (two) times daily.   pravastatin 20 MG tablet Commonly known as:  PRAVACHOL Take 1 tablet (20 mg total) by mouth daily at 6 PM.   QUEtiapine 12.5 mg Tabs tablet Commonly known as:  SEROQUEL Take 12.5 mg by mouth at bedtime.   tamsulosin 0.4 MG Caps capsule Commonly known as:  FLOMAX Take 0.4 mg by mouth daily.   TYLENOL 325 MG tablet Generic drug:  acetaminophen Take 650 mg by mouth every 6 (six) hours as needed for mild pain or moderate pain.   vitamin B-12 1000 MCG tablet Commonly known as:  CYANOCOBALAMIN Take 1,000 mcg by mouth daily.       Meds ordered this encounter  Medications  . QUEtiapine (SEROQUEL) 12.5 mg TABS tablet    Sig: Take 12.5 mg by mouth at bedtime.  . mirtazapine (REMERON) 7.5 MG tablet    Sig: Take 7.5 mg by mouth at bedtime.  Marland Kitchen NUTRITIONAL SUPPLEMENTS PO    Sig: Take Medpass 4 oz by mouth 2 times daily to support nutritional needs    Immunization History  Administered Date(s) Administered  . PPD Test 12/26/2015, 08/09/2016    Social History  Substance Use Topics  . Smoking status: Never Smoker  . Smokeless tobacco: Never Used  . Alcohol use No    Review of Systems  DATA OBTAINED: from nurse GENERAL:  no fevers, fatigue, appetite changes SKIN: No itching, rash HEENT: No complaint RESPIRATORY: No cough, wheezing, SOB CARDIAC: No chest pain, palpitations, lower  extremity edema  GI: No abdominal pain, No N/V/D or constipation, No heartburn or reflux  GU: No dysuria, frequency or urgency, or incontinence  MUSCULOSKELETAL: No unrelieved bone/joint pain NEUROLOGIC: No headache, dizziness  PSYCHIATRIC: fear and anxiety in evening as per HPI  Vitals:   08/26/16 1202  BP: 115/62  Pulse: 91  Resp: 20  Temp: 97.5 F (36.4 C)   Body mass index is 22.01 kg/m. Physical Exam  GENERAL APPEARANCE: Alert, min conversant, No acute distress  SKIN: No diaphoresis rash HEENT: Unremarkable RESPIRATORY: Breathing is even, unlabored. Lung sounds are clear   CARDIOVASCULAR: Heart irreg no murmurs, rubs or gallops. No peripheral edema  GASTROINTESTINAL: Abdomen is soft, non-tender, not distended w/ normal  bowel sounds.  GENITOURINARY: Bladder non tender, not distended  MUSCULOSKELETAL: No abnormal joints or musculature NEUROLOGIC: Cranial nerves 2-12 grossly intact; L side weakness PSYCHIATRIC: Mood and affect different; no behavioral issues  Patient Active Problem List   Diagnosis Date Noted  . Somnolence, daytime   . Acute ischemic left MCA stroke (HCC) 08/06/2016  . Acute urinary retention 08/06/2016  . Aphasia   . Depression 02/03/2016  . Insomnia 02/03/2016  . Chronic musculoskeletal pain 02/03/2016  . GERD (gastroesophageal reflux disease) 12/31/2015  . Pressure ulcer 12/23/2015  . Atrial fibrillation (HCC) 06/06/2015  . Embolic stroke (HCC) 03/22/2015  . Left hemiparesis (HCC)   . CVA (cerebral vascular accident) (HCC)   . H/O TIA (transient ischemic attack) and stroke   . Spinal stenosis, lumbar region, with neurogenic claudication 01/17/2015  . Lumbar stenosis 01/17/2015  . 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 Essential hypertension 11/10/2014  . HLD (hyperlipidemia) 11/10/2014  . Chronic back pain 11/08/2014  . Cerebral infarction due to thrombosis of cerebral artery (HCC)   . Hyperlipidemia LDL goal <70 09/07/2014  .  Presumed CVA (cerebral infarction) s/p IV tPA, MRI negative 09/05/2014  . Hypertensive urgency 09/05/2014  . Right bundle branch block 03/21/2014  . Cough 10/31/2012  . Elevated lipase 10/30/2012  . HTN (hypertension) 10/30/2012  . Hypothyroidism 10/30/2012    CMP     Component Value Date/Time   NA 142 08/14/2016   K 3.8 08/14/2016   CL 102 08/08/2016 1216   CO2 25 08/08/2016 1216   GLUCOSE 95 08/08/2016 1216   BUN 23 (A) 08/14/2016   CREATININE 0.7 08/14/2016   CREATININE 0.78 08/08/2016 1216   CALCIUM 9.2 08/08/2016 1216   PROT 7.5 08/06/2016 0759   ALBUMIN 3.9 08/06/2016 0759   AST 22 08/06/2016 0759   ALT 18 08/06/2016 0759   ALKPHOS 65 08/06/2016 0759   BILITOT 0.3 08/06/2016 0759   GFRNONAA >60 08/08/2016 1216   GFRAA >60 08/08/2016 1216    Recent Labs  12/24/15 1025  08/06/16 0759 08/06/16 0812 08/08/16 1216 08/14/16  NA 140  < > 142  142 145  145 138 142  K 3.7  < > 4.0  4.0 3.9  3.9 4.2 3.8  CL 108  --  107 103 102  --   CO2 28  --  31  --  25  --   GLUCOSE 129*  --  105* 103* 95  --   BUN 11  < > 15  15 19  19 14  23*  CREATININE 0.78  < > 0.93  0.9 1.00  1.0 0.78 0.7  CALCIUM 8.5*  --  9.7  --  9.2  --   < > = values in this interval not displayed.  Recent Labs  12/22/15 1654 12/23/15 0516 08/06/16 0759  AST 42* 33 22  ALT 23 19 18   ALKPHOS 62 51 65  BILITOT 0.8 1.0 0.3  PROT 6.9 6.1* 7.5  ALBUMIN 4.1 3.4* 3.9    Recent Labs  12/22/15 1654  12/23/15 0516 01/08/16 08/06/16 0759 08/06/16 0812 08/14/16  WBC 9.4  < > 6.9 5.0 6.6  6.6  --  7.4  NEUTROABS 6.9  --   --   --  3.3  --   --   HGB 13.2  --  11.9* 12.1 12.8  12.8 14.3 14.0  HCT 40.3  --  36.0 38 40.3  40 42.0 44  MCV 87.2  --  86.1  --  90.0  --   --   PLT 283  --  243 244 246  246  --  268  < > = values in this interval not displayed.  Recent Labs  08/07/16 0715  CHOL 180  LDLCALC 118*  TRIG 40   No results found for: [facility] Lab Results  Component Value  Date   TSH 0.690 12/23/2015   Lab Results  Component Value Date   HGBA1C 5.8 (H) 08/07/2016   Lab Results  Component Value Date   CHOL 180 08/07/2016   HDL 54 08/07/2016   LDLCALC 118 (H) 08/07/2016   TRIG 40 08/07/2016   CHOLHDL 3.3 08/07/2016    Significant Diagnostic Results in last 30 days:  Ct Angio Head W Or Wo Contrast  Result Date: 08/08/2016 CLINICAL DATA:  83 year old female with intracranial atherosclerosis including chronic distal right ACA occlusion. Recent MRI suggesting acute or subacute ischemia of the left internal capsule, while recent CTA was negative for emergent large vessel occlusion. Worsening mental status, now completely unresponsive. Initial encounter. EXAM: CT ANGIOGRAPHY HEAD AND NECK TECHNIQUE: Multidetector CT imaging of the head and neck was performed using the standard protocol during bolus administration of intravenous contrast. Multiplanar CT image reconstructions and MIPs were obtained to evaluate the vascular anatomy. Carotid stenosis measurements (when applicable) are obtained utilizing NASCET criteria, using the distal internal carotid diameter as the denominator. CONTRAST:  50 mL Isovue 370 COMPARISON:  CTA head and neck 08/06/2016. Brain MRI 08/06/2016, and earlier. FINDINGS: CT HEAD Brain: Mild if any interval hypodensity at the left lateral thalamus and internal capsule corresponding to the MRA finding. Stable gray-white matter differentiation elsewhere. Chronic right ACA territory encephalomalacia. No acute intracranial hemorrhage identified. No midline shift, mass effect, or evidence of intracranial mass lesion. No ventriculomegaly. Calvarium and skull base: Stable. No acute osseous abnormality identified. Paranasal sinuses: Visualized paranasal sinuses and mastoids are stable and well pneumatized. Orbits: Stable orbit and scalp soft tissues. CTA NECK Skeleton: Degenerative changes in the spine. Osteopenia. No acute osseous abnormality identified. Upper  chest: Stable lung apices. No superior mediastinal lymphadenopathy. Small calcified hilar and mediastinal lymph nodes compatible with prior granulomatous disease. Other neck: Stable and negative; diminutive or absent thyroid. Aortic arch: Stable 3 vessel arch configuration and calcified arch atherosclerosis. Right carotid system: Stable tortuosity with a kinked appearance of the proximal right CCA. Unchanged right carotid bifurcation and cervical right ICA. Left carotid system: Stable left CCA tortuosity. Unchanged left carotid bifurcation and cervical left ICA. Vertebral arteries:Stable tortuosity and calcified plaque in the proximal right subclavian artery with a kinked appearance. Stable cervical right vertebral artery, negative except for tortuosity. Stable proximal left subclavian calcified plaque without stenosis. Stable cervical left vertebral artery, negative except for tortuosity. CTA HEAD Posterior circulation: Stable distal vertebral artery is without stenosis. Both PICA origins remain patent. Patent vertebrobasilar junction. Stable basilar artery without stenosis. Unchanged PCA origins, moderate right P1 and P 2 irregularity and stenosis and moderate left P3 segment irregularity and stenosis. Posterior communicating arteries are diminutive or absent. Anterior circulation: Both ICA siphons remain patent and are stable with calcified plaque. Carotid termini are stable and normal. Stable bilateral ACA is including chronic occlusion of the distal right ACA, and moderate left ACA 8 pericallosal artery irregularity and stenosis. Moderate to severe left MCA M1 and mild to moderate right M1 irregularity and stenosis is stable. Both MCA bifurcations remain patent. Bilateral MCA branches are stable including right greater than left branch irregularity and stenosis. Venous  sinuses: Patent. Anatomic variants: None. Delayed phase: No abnormal enhancement identified. Review of the MIP images confirms the above  findings IMPRESSION: 1. Negative for emergent large vessel occlusion and Unchanged CTA head and neck findings from 2 days ago. 2. Arterial tortuosity in the neck with mild extracranial atherosclerosis and no extracranial stenosis. 3. Widespread intracranial atherosclerosis with chronic distal right ACA occlusion and moderate stenoses of the left ACA, bilateral MCAs (left greater than right M1n and right greater than left M2), and bilateral PCAs (right P1 and P2, and left P3). 4. Stable CT appearance of the brain since 08/06/2016. Electronically Signed   By: Odessa FlemingH  Hall M.D.   On: 08/08/2016 12:26   Ct Angio Head W Or Wo Contrast  Result Date: 08/06/2016 CLINICAL DATA:  83 year old female with right facial droop and aphasia. Code stroke. Personal history of distal right ACA infarct and intracranial atherosclerosis. Initial encounter. EXAM: CT PERFUSION BRAIN CT ANGIOGRAPHY HEAD AND NECK TECHNIQUE: Multiphase CT imaging of the brain was performed following IV bolus contrast injection. Subsequent parametric perfusion maps were calculated using RAPID software. Multidetector CT imaging of the head and neck was performed using the standard protocol during bolus administration of intravenous contrast. Multiplanar CT image reconstructions and MIPs were obtained to evaluate the vascular anatomy. Carotid stenosis measurements (when applicable) are obtained utilizing NASCET criteria, using the distal internal carotid diameter as the denominator. CONTRAST:  100 mL Isovue 370 COMPARISON:  Head CT without contrast today 0815 hours. Brain MRI and intracranial MRA 01/18/2015. FINDINGS: CT BRAIN PERFUSION FINDINGS CBF (<30%) Volume:  0 mL Perfusion (Tmax>6.0s) volume: 4 mL (likely artifactual) Mismatch Volume: Not applicable Infarction Location: Not applicable CTA NECK FINDINGS Skeleton: Advanced degenerative changes in the cervical spine. No acute osseous abnormality identified. Upper chest: No acute apical lung findings aside from  mild atelectasis. No superior mediastinal lymphadenopathy. Other neck: Diminutive or absent thyroid. Negative larynx, pharynx, parapharyngeal spaces, retropharyngeal space, sublingual space, submandibular glands and parotid glands. No cervical lymphadenopathy. Aortic arch: 3 vessel arch configuration. Mild to moderate mostly distal arch calcified atherosclerosis. Right carotid system: No brachiocephalic artery stenosis. Tortuous right CCA origin and proximal right CCA with a kinked appearance but no other stenosis. Minimal circumferential soft plaque in the right CCA. At the right carotid bifurcation there is mostly calcified plaque without stenosis. Mildly tortuous otherwise negative cervical right ICA. Left carotid system: Minimal plaque at the left CCA origin without stenosis. Tortuous left CCA. No stenosis proximal to the bifurcation. Mild calcified plaque at the bifurcation without stenosis. Mildly tortuous cervical left ICA. Vertebral arteries: Calcified plaque and tortuosity at the proximal right subclavian artery origin. Normal right vertebral artery origin. Tortuous but otherwise negative cervical right vertebral artery. Calcified plaque in the proximal left subclavian artery without stenosis. Normal left vertebral artery origin. Tortuous left V1 segment. Tortuous left V2 and V3 segments with otherwise negative cervical left vertebral artery. CTA HEAD Posterior circulation: Both distal vertebral arteries taper in the V4 segments without focal stenosis. Both PICA origins are patent. Patent vertebrobasilar junction. No basilar stenosis. Normal SCA and left PCA origins. Mild to moderate irregularity of the right PCA origin and proximal P1 segment with stenosis. Similar right P2 segment findings (series 507, image 19). Mild left PCA irregularity. Moderate distal left PCA (P3 segment irregularity and stenosis (series 506, image 33). Posterior communicating arteries are diminutive or absent. Anterior circulation:  Both ICA siphons are patent. There is moderate left siphon calcified plaque without significant stenosis. Similar moderate calcified plaque  throughout the right ICA siphon with no significant stenosis. Patent carotid termini, MCA and ACA origins. The left ACA and a small anterior communicating artery are within normal limits. There is mild to moderate right A1 segment irregularity, and the right A2 segment is occluded proximal to the pericallosal artery level. There is mild to moderate distal left ACA branch irregularity (series 506, image 30). Right MCA M1 segment is mildly irregular. There is up to moderate mid right M1 segment irregularity and stenosis as seen on series 505, image 18 which is stable from the 2016 MRA. The right MCA bifurcation is patent. There is moderate right M2 branch irregularity and stenosis (series 506, image 22). The left mid M1 segment demonstrates moderate irregularity and stenosis (series 505, image 20 and series 504, image 176) which appears stable to the 2016 MRA. The left MCA bifurcation remains patent. There is only mild left M2 branch irregularity and stenosis. Venous sinuses: Patent. Anatomic variants: None. Review of the MIP images confirms the above findings IMPRESSION: 1. CT Perfusion is negative for evidence of core infarct and CTA is negative for emergent large vessel occlusion. A text page with preliminary findings of negative ELVO was sent to [facility] on 08/06/2016 at 0832 hours. 2. Generalized arterial tortuosity in the neck with mild atherosclerosis and no stenosis. 3. More moderate to severe chronic intracranial atherosclerosis and stenosis with chronic occlusion of the distal right ACA. Significant proximal stenoses which appear stable to the 2016 intracranial MRA include - Moderate bilateral MCA M1 segment stenoses, and - Moderate Right P1 and P2 segments stenoses. 4. Up to moderate distal branch irregularity and stenosis in the left PCA, left ACA, and right  greater than left MCA territories. Electronically Signed   By: Odessa Fleming M.D.   On: 08/06/2016 09:06   Ct Angio Neck W Or Wo Contrast  Result Date: 08/08/2016 CLINICAL DATA:  83 year old female with intracranial atherosclerosis including chronic distal right ACA occlusion. Recent MRI suggesting acute or subacute ischemia of the left internal capsule, while recent CTA was negative for emergent large vessel occlusion. Worsening mental status, now completely unresponsive. Initial encounter. EXAM: CT ANGIOGRAPHY HEAD AND NECK TECHNIQUE: Multidetector CT imaging of the head and neck was performed using the standard protocol during bolus administration of intravenous contrast. Multiplanar CT image reconstructions and MIPs were obtained to evaluate the vascular anatomy. Carotid stenosis measurements (when applicable) are obtained utilizing NASCET criteria, using the distal internal carotid diameter as the denominator. CONTRAST:  50 mL Isovue 370 COMPARISON:  CTA head and neck 08/06/2016. Brain MRI 08/06/2016, and earlier. FINDINGS: CT HEAD Brain: Mild if any interval hypodensity at the left lateral thalamus and internal capsule corresponding to the MRA finding. Stable gray-white matter differentiation elsewhere. Chronic right ACA territory encephalomalacia. No acute intracranial hemorrhage identified. No midline shift, mass effect, or evidence of intracranial mass lesion. No ventriculomegaly. Calvarium and skull base: Stable. No acute osseous abnormality identified. Paranasal sinuses: Visualized paranasal sinuses and mastoids are stable and well pneumatized. Orbits: Stable orbit and scalp soft tissues. CTA NECK Skeleton: Degenerative changes in the spine. Osteopenia. No acute osseous abnormality identified. Upper chest: Stable lung apices. No superior mediastinal lymphadenopathy. Small calcified hilar and mediastinal lymph nodes compatible with prior granulomatous disease. Other neck: Stable and negative; diminutive or  absent thyroid. Aortic arch: Stable 3 vessel arch configuration and calcified arch atherosclerosis. Right carotid system: Stable tortuosity with a kinked appearance of the proximal right CCA. Unchanged right carotid bifurcation and cervical right ICA.  Left carotid system: Stable left CCA tortuosity. Unchanged left carotid bifurcation and cervical left ICA. Vertebral arteries:Stable tortuosity and calcified plaque in the proximal right subclavian artery with a kinked appearance. Stable cervical right vertebral artery, negative except for tortuosity. Stable proximal left subclavian calcified plaque without stenosis. Stable cervical left vertebral artery, negative except for tortuosity. CTA HEAD Posterior circulation: Stable distal vertebral artery is without stenosis. Both PICA origins remain patent. Patent vertebrobasilar junction. Stable basilar artery without stenosis. Unchanged PCA origins, moderate right P1 and P 2 irregularity and stenosis and moderate left P3 segment irregularity and stenosis. Posterior communicating arteries are diminutive or absent. Anterior circulation: Both ICA siphons remain patent and are stable with calcified plaque. Carotid termini are stable and normal. Stable bilateral ACA is including chronic occlusion of the distal right ACA, and moderate left ACA 8 pericallosal artery irregularity and stenosis. Moderate to severe left MCA M1 and mild to moderate right M1 irregularity and stenosis is stable. Both MCA bifurcations remain patent. Bilateral MCA branches are stable including right greater than left branch irregularity and stenosis. Venous sinuses: Patent. Anatomic variants: None. Delayed phase: No abnormal enhancement identified. Review of the MIP images confirms the above findings IMPRESSION: 1. Negative for emergent large vessel occlusion and Unchanged CTA head and neck findings from 2 days ago. 2. Arterial tortuosity in the neck with mild extracranial atherosclerosis and no  extracranial stenosis. 3. Widespread intracranial atherosclerosis with chronic distal right ACA occlusion and moderate stenoses of the left ACA, bilateral MCAs (left greater than right M1n and right greater than left M2), and bilateral PCAs (right P1 and P2, and left P3). 4. Stable CT appearance of the brain since 08/06/2016. Electronically Signed   By: Odessa Fleming M.D.   On: 08/08/2016 12:26   Ct Angio Neck W Or Wo Contrast  Result Date: 08/06/2016 CLINICAL DATA:  83 year old female with right facial droop and aphasia. Code stroke. Personal history of distal right ACA infarct and intracranial atherosclerosis. Initial encounter. EXAM: CT PERFUSION BRAIN CT ANGIOGRAPHY HEAD AND NECK TECHNIQUE: Multiphase CT imaging of the brain was performed following IV bolus contrast injection. Subsequent parametric perfusion maps were calculated using RAPID software. Multidetector CT imaging of the head and neck was performed using the standard protocol during bolus administration of intravenous contrast. Multiplanar CT image reconstructions and MIPs were obtained to evaluate the vascular anatomy. Carotid stenosis measurements (when applicable) are obtained utilizing NASCET criteria, using the distal internal carotid diameter as the denominator. CONTRAST:  100 mL Isovue 370 COMPARISON:  Head CT without contrast today 0815 hours. Brain MRI and intracranial MRA 01/18/2015. FINDINGS: CT BRAIN PERFUSION FINDINGS CBF (<30%) Volume:  0 mL Perfusion (Tmax>6.0s) volume: 4 mL (likely artifactual) Mismatch Volume: Not applicable Infarction Location: Not applicable CTA NECK FINDINGS Skeleton: Advanced degenerative changes in the cervical spine. No acute osseous abnormality identified. Upper chest: No acute apical lung findings aside from mild atelectasis. No superior mediastinal lymphadenopathy. Other neck: Diminutive or absent thyroid. Negative larynx, pharynx, parapharyngeal spaces, retropharyngeal space, sublingual space, submandibular  glands and parotid glands. No cervical lymphadenopathy. Aortic arch: 3 vessel arch configuration. Mild to moderate mostly distal arch calcified atherosclerosis. Right carotid system: No brachiocephalic artery stenosis. Tortuous right CCA origin and proximal right CCA with a kinked appearance but no other stenosis. Minimal circumferential soft plaque in the right CCA. At the right carotid bifurcation there is mostly calcified plaque without stenosis. Mildly tortuous otherwise negative cervical right ICA. Left carotid system: Minimal plaque at the left CCA  origin without stenosis. Tortuous left CCA. No stenosis proximal to the bifurcation. Mild calcified plaque at the bifurcation without stenosis. Mildly tortuous cervical left ICA. Vertebral arteries: Calcified plaque and tortuosity at the proximal right subclavian artery origin. Normal right vertebral artery origin. Tortuous but otherwise negative cervical right vertebral artery. Calcified plaque in the proximal left subclavian artery without stenosis. Normal left vertebral artery origin. Tortuous left V1 segment. Tortuous left V2 and V3 segments with otherwise negative cervical left vertebral artery. CTA HEAD Posterior circulation: Both distal vertebral arteries taper in the V4 segments without focal stenosis. Both PICA origins are patent. Patent vertebrobasilar junction. No basilar stenosis. Normal SCA and left PCA origins. Mild to moderate irregularity of the right PCA origin and proximal P1 segment with stenosis. Similar right P2 segment findings (series 507, image 19). Mild left PCA irregularity. Moderate distal left PCA (P3 segment irregularity and stenosis (series 506, image 33). Posterior communicating arteries are diminutive or absent. Anterior circulation: Both ICA siphons are patent. There is moderate left siphon calcified plaque without significant stenosis. Similar moderate calcified plaque throughout the right ICA siphon with no significant stenosis.  Patent carotid termini, MCA and ACA origins. The left ACA and a small anterior communicating artery are within normal limits. There is mild to moderate right A1 segment irregularity, and the right A2 segment is occluded proximal to the pericallosal artery level. There is mild to moderate distal left ACA branch irregularity (series 506, image 30). Right MCA M1 segment is mildly irregular. There is up to moderate mid right M1 segment irregularity and stenosis as seen on series 505, image 18 which is stable from the 2016 MRA. The right MCA bifurcation is patent. There is moderate right M2 branch irregularity and stenosis (series 506, image 22). The left mid M1 segment demonstrates moderate irregularity and stenosis (series 505, image 20 and series 504, image 176) which appears stable to the 2016 MRA. The left MCA bifurcation remains patent. There is only mild left M2 branch irregularity and stenosis. Venous sinuses: Patent. Anatomic variants: None. Review of the MIP images confirms the above findings IMPRESSION: 1. CT Perfusion is negative for evidence of core infarct and CTA is negative for emergent large vessel occlusion. A text page with preliminary findings of negative ELVO was sent to [facility] on 08/06/2016 at 0832 hours. 2. Generalized arterial tortuosity in the neck with mild atherosclerosis and no stenosis. 3. More moderate to severe chronic intracranial atherosclerosis and stenosis with chronic occlusion of the distal right ACA. Significant proximal stenoses which appear stable to the 2016 intracranial MRA include - Moderate bilateral MCA M1 segment stenoses, and - Moderate Right P1 and P2 segments stenoses. 4. Up to moderate distal branch irregularity and stenosis in the left PCA, left ACA, and right greater than left MCA territories. Electronically Signed   By: Odessa Fleming M.D.   On: 08/06/2016 09:06   Mr Maxine Glenn Head Wo Contrast  Result Date: 08/06/2016 CLINICAL DATA:  83 y/o F; slurred speech and  right facial drooping. Leftward gaze. EXAM: MRI HEAD WITHOUT CONTRAST MRA HEAD WITHOUT CONTRAST TECHNIQUE: Multiplanar, multiecho pulse sequences of the brain and surrounding structures were obtained without intravenous contrast. Angiographic images of the head were obtained using MRA technique without contrast. COMPARISON:  CT head, CT angiogram head, and CT cerebral perfusion dated 08/06/2016. MRI brain and MRA head dated 01/18/2015. FINDINGS: MRI HEAD FINDINGS Brain: 12 x 7 mm focus of diffusion restriction (AP by ML series 3, image 30 series 300, image  30) centered within the left posterior limb of internal capsule consistent with acute/ early subacute infarction. Chronic distal right ACA distribution infarction with laminar necrosis. Chronic infarct within the left lentiform nucleus. Stable background of chronic microvascular ischemic changes and parenchymal volume loss of the brain. No new susceptibility hypointensity to indicate interval intracranial hemorrhage. Vascular: As below. Skull and upper cervical spine: Choose Sinuses/Orbits: Mild right maxillary sinus mucosal thickening and ethmoid sinus mucosal thickening. Bilateral intra-ocular lens replacement. Trace left mastoid tip effusion. Otherwise negative. Other: None. MRA HEAD FINDINGS Moderate motion artifact, suboptimal evaluation for subtle stenosis or aneurysm. No gross interval change from prior CT angiogram with persistent chronic occlusion of the distal right anterior cerebral artery and intracranial atherosclerosis with areas of mild-to-moderate stenosis most pronounced and bilateral M1 and P1 segments. No proximal large vessel occlusion of the bilateral middle cerebral arteries, left anterior cerebral artery, bilateral posterior cerebral arteries, vertebral arteries, internal carotid arteries, or the basilar artery. IMPRESSION: 1. Small acute/ early subacute infarct within the left posterior limb of internal capsule. No acute hemorrhage  identified. 2. Stable chronic distal right ACA and left lentiform nuclei infarcts. 3. Stable moderate chronic microvascular ischemic changes and parenchymal volume loss of the brain. 4. Mild paranasal sinus disease. 5. Motion degraded time-of-flight MRA. No gross interval change from prior CT angiogram. No acute proximal large vessel occlusion. These results will be called to the ordering clinician or representative by the Radiologist Assistant, and communication documented in the PACS or zVision Dashboard. Electronically Signed   By: Mitzi Hansen M.D.   On: 08/06/2016 15:09   Mr Brain Wo Contrast  Result Date: 08/06/2016 CLINICAL DATA:  83 y/o F; slurred speech and right facial drooping. Leftward gaze. EXAM: MRI HEAD WITHOUT CONTRAST MRA HEAD WITHOUT CONTRAST TECHNIQUE: Multiplanar, multiecho pulse sequences of the brain and surrounding structures were obtained without intravenous contrast. Angiographic images of the head were obtained using MRA technique without contrast. COMPARISON:  CT head, CT angiogram head, and CT cerebral perfusion dated 08/06/2016. MRI brain and MRA head dated 01/18/2015. FINDINGS: MRI HEAD FINDINGS Brain: 12 x 7 mm focus of diffusion restriction (AP by ML series 3, image 30 series 300, image 30) centered within the left posterior limb of internal capsule consistent with acute/ early subacute infarction. Chronic distal right ACA distribution infarction with laminar necrosis. Chronic infarct within the left lentiform nucleus. Stable background of chronic microvascular ischemic changes and parenchymal volume loss of the brain. No new susceptibility hypointensity to indicate interval intracranial hemorrhage. Vascular: As below. Skull and upper cervical spine: Choose Sinuses/Orbits: Mild right maxillary sinus mucosal thickening and ethmoid sinus mucosal thickening. Bilateral intra-ocular lens replacement. Trace left mastoid tip effusion. Otherwise negative. Other: None. MRA HEAD  FINDINGS Moderate motion artifact, suboptimal evaluation for subtle stenosis or aneurysm. No gross interval change from prior CT angiogram with persistent chronic occlusion of the distal right anterior cerebral artery and intracranial atherosclerosis with areas of mild-to-moderate stenosis most pronounced and bilateral M1 and P1 segments. No proximal large vessel occlusion of the bilateral middle cerebral arteries, left anterior cerebral artery, bilateral posterior cerebral arteries, vertebral arteries, internal carotid arteries, or the basilar artery. IMPRESSION: 1. Small acute/ early subacute infarct within the left posterior limb of internal capsule. No acute hemorrhage identified. 2. Stable chronic distal right ACA and left lentiform nuclei infarcts. 3. Stable moderate chronic microvascular ischemic changes and parenchymal volume loss of the brain. 4. Mild paranasal sinus disease. 5. Motion degraded time-of-flight MRA. No gross interval change  from prior CT angiogram. No acute proximal large vessel occlusion. These results will be called to the ordering clinician or representative by the Radiologist Assistant, and communication documented in the PACS or zVision Dashboard. Electronically Signed   By: Mitzi Hansen M.D.   On: 08/06/2016 15:09   US Renal  Result Date: 08/08/2016 CLINICAL DATA:  Bladder outlet obstruction. EXAM: RENAL / URINARY TRACT ULTRASOUND COMPLETE COMPARISON:  CT 10/30/2012 FINDINGS: Right Kidney: Length: 9.2 cm. Echogenicity within normal limits. No mass or hydronephrosis visualized. Left Kidney: Technically limited evaluation. Length: 9.5 cm. Echogenicity within normal limits. No mass or hydronephrosis visualized. Bladder: Decompressed by Foley catheter and not evaluated. IMPRESSION: No hydronephrosis. Bladder decompressed by Foley catheter and not evaluated. Electronically Signed   By: Rubye Oaks M.D.   On: 08/08/2016 01:04   Ct Cerebral Perfusion W Contrast  Result  Date: 08/06/2016 CLINICAL DATA:  83 year old female with right facial droop and aphasia. Code stroke. Personal history of distal right ACA infarct and intracranial atherosclerosis. Initial encounter. EXAM: CT PERFUSION BRAIN CT ANGIOGRAPHY HEAD AND NECK TECHNIQUE: Multiphase CT imaging of the brain was performed following IV bolus contrast injection. Subsequent parametric perfusion maps were calculated using RAPID software. Multidetector CT imaging of the head and neck was performed using the standard protocol during bolus administration of intravenous contrast. Multiplanar CT image reconstructions and MIPs were obtained to evaluate the vascular anatomy. Carotid stenosis measurements (when applicable) are obtained utilizing NASCET criteria, using the distal internal carotid diameter as the denominator. CONTRAST:  100 mL Isovue 370 COMPARISON:  Head CT without contrast today 0815 hours. Brain MRI and intracranial MRA 01/18/2015. FINDINGS: CT BRAIN PERFUSION FINDINGS CBF (<30%) Volume:  0 mL Perfusion (Tmax>6.0s) volume: 4 mL (likely artifactual) Mismatch Volume: Not applicable Infarction Location: Not applicable CTA NECK FINDINGS Skeleton: Advanced degenerative changes in the cervical spine. No acute osseous abnormality identified. Upper chest: No acute apical lung findings aside from mild atelectasis. No superior mediastinal lymphadenopathy. Other neck: Diminutive or absent thyroid. Negative larynx, pharynx, parapharyngeal spaces, retropharyngeal space, sublingual space, submandibular glands and parotid glands. No cervical lymphadenopathy. Aortic arch: 3 vessel arch configuration. Mild to moderate mostly distal arch calcified atherosclerosis. Right carotid system: No brachiocephalic artery stenosis. Tortuous right CCA origin and proximal right CCA with a kinked appearance but no other stenosis. Minimal circumferential soft plaque in the right CCA. At the right carotid bifurcation there is mostly calcified plaque  without stenosis. Mildly tortuous otherwise negative cervical right ICA. Left carotid system: Minimal plaque at the left CCA origin without stenosis. Tortuous left CCA. No stenosis proximal to the bifurcation. Mild calcified plaque at the bifurcation without stenosis. Mildly tortuous cervical left ICA. Vertebral arteries: Calcified plaque and tortuosity at the proximal right subclavian artery origin. Normal right vertebral artery origin. Tortuous but otherwise negative cervical right vertebral artery. Calcified plaque in the proximal left subclavian artery without stenosis. Normal left vertebral artery origin. Tortuous left V1 segment. Tortuous left V2 and V3 segments with otherwise negative cervical left vertebral artery. CTA HEAD Posterior circulation: Both distal vertebral arteries taper in the V4 segments without focal stenosis. Both PICA origins are patent. Patent vertebrobasilar junction. No basilar stenosis. Normal SCA and left PCA origins. Mild to moderate irregularity of the right PCA origin and proximal P1 segment with stenosis. Similar right P2 segment findings (series 507, image 19). Mild left PCA irregularity. Moderate distal left PCA (P3 segment irregularity and stenosis (series 506, image 33). Posterior communicating arteries are diminutive or absent. Anterior circulation:  Both ICA siphons are patent. There is moderate left siphon calcified plaque without significant stenosis. Similar moderate calcified plaque throughout the right ICA siphon with no significant stenosis. Patent carotid termini, MCA and ACA origins. The left ACA and a small anterior communicating artery are within normal limits. There is mild to moderate right A1 segment irregularity, and the right A2 segment is occluded proximal to the pericallosal artery level. There is mild to moderate distal left ACA branch irregularity (series 506, image 30). Right MCA M1 segment is mildly irregular. There is up to moderate mid right M1 segment  irregularity and stenosis as seen on series 505, image 18 which is stable from the 2016 MRA. The right MCA bifurcation is patent. There is moderate right M2 branch irregularity and stenosis (series 506, image 22). The left mid M1 segment demonstrates moderate irregularity and stenosis (series 505, image 20 and series 504, image 176) which appears stable to the 2016 MRA. The left MCA bifurcation remains patent. There is only mild left M2 branch irregularity and stenosis. Venous sinuses: Patent. Anatomic variants: None. Review of the MIP images confirms the above findings IMPRESSION: 1. CT Perfusion is negative for evidence of core infarct and CTA is negative for emergent large vessel occlusion. A text page with preliminary findings of negative ELVO was sent to [facility] on 08/06/2016 at 0832 hours. 2. Generalized arterial tortuosity in the neck with mild atherosclerosis and no stenosis. 3. More moderate to severe chronic intracranial atherosclerosis and stenosis with chronic occlusion of the distal right ACA. Significant proximal stenoses which appear stable to the 2016 intracranial MRA include - Moderate bilateral MCA M1 segment stenoses, and - Moderate Right P1 and P2 segments stenoses. 4. Up to moderate distal branch irregularity and stenosis in the left PCA, left ACA, and right greater than left MCA territories. Electronically Signed   By: Odessa Fleming M.D.   On: 08/06/2016 09:06   Dg Chest Portable 1 View  Result Date: 08/06/2016 CLINICAL DATA:  Stroke symptoms EXAM: PORTABLE CHEST 1 VIEW COMPARISON:  02/07/2015 FINDINGS: Normal cardiac silhouette. Improvement aeration lung bases. Small LEFT effusion remains. Chronic bronchitic markings. Hiatal hernia noted. IMPRESSION: 1. Hiatal hernia and LEFT effusion. 2. No pulmonary edema or infiltrate. Electronically Signed   By: Genevive Bi M.D.   On: 08/06/2016 09:13   Ct Head Code Stroke W/o Cm  Result Date: 08/06/2016 CLINICAL DATA:  Code stroke.  83 year old female with aphasia and right facial droop. Initial encounter. EXAM: CT HEAD WITHOUT CONTRAST TECHNIQUE: Contiguous axial images were obtained from the base of the skull through the vertex without intravenous contrast. COMPARISON:  12/22/2015 and earlier. FINDINGS: Brain: Sequelae of posterior right ACA infarct with encephalomalacia in the posterosuperior right frontal lobe and medial right parietal lobe. No acute intracranial hemorrhage identified. No midline shift, mass effect, or evidence of intracranial mass lesion. No ventriculomegaly. There is subtle loss of gray-white matter differentiation in the posterior left MCA territory such as on series 21, image 21 in the posterior frontal and parietal lobe. Stable either dilated perivascular space or chronic lacunar infarct at the inferior left basal ganglia. Vascular: Calcified atherosclerosis at the skull base. Suspicious hyperdensity at the left ICA terminus on coronal image 36. Skull: No acute osseous abnormality identified. Sinuses/Orbits: Visualized paranasal sinuses and mastoids are stable and well pneumatized. Other: No acute orbit or scalp soft tissue findings. ASPECTS [facility] Stroke Program Early CT Score) - Ganglionic level infarction (caudate, lentiform nuclei, internal capsule, insula, M1-M3 cortex): 7 -  Supraganglionic infarction (M4-M6 cortex): 2 Total score (0-10 with 10 being normal): 9 IMPRESSION: 1. Suspicious hyperdensity at the left ICA terminus and suspect subtle loss of gray-white differentiation in the posterior left MCA territory. No acute hemorrhage or intracranial mass effect. 2. ASPECTS is 9. 3. Chronic posterior right ACA infarct. 4. Study discussed by telephone with Dr. Aram Beecham MESNER on 08/06/2016 at 0823 hours Electronically Signed   By: Odessa Fleming M.D.   On: 08/06/2016 08:29    Assessment and Plan  [place] - THERE SEEMS TO BE A TOUCH OF PSYCHOTIC FEATURE SO HAVE ELECTED TO UESE SEROQUEL, LOW DOSE TO  BEGIN AS PT SEEMA VERY SENSITIVE TO MEDICATIONS; SEROQUEL 12.5 MG Q HS; WILL MONITOR   Eula Jaster D. Lyn Hollingshead, MD

## 2016-08-26 NOTE — Progress Notes (Signed)
Carelink summary report received. Battery status OK. Normal device function. No new symptom episodes, tachy episodes, brady, or pause episodes. 2 AF 0% +eliquis. Monthly summary reports and ROV/PRN

## 2016-08-27 ENCOUNTER — Encounter: Payer: Self-pay | Admitting: Internal Medicine

## 2016-08-28 ENCOUNTER — Other Ambulatory Visit: Payer: Self-pay | Admitting: *Deleted

## 2016-08-28 NOTE — Patient Outreach (Signed)
[facility]) Care Management  08/28/2016  ARDA DAGGS [birth year] [id number]   Met with Lexine Baton, discharge planner at facility. Discussed patient discharge plans. She reports that patient lives in an apartment behind her son and daughter-in-law. She has private pay aide assistance, supportive family.   Reviewed that patient is eligible for [facility] care management services.  Lexine Baton does not anticipate any difficult discharge.   Plan to check back closer to discharge and assess for any [facility] Care Management needs.  Royetta Crochet. Laymond Purser, RN, BSN, [place] [phone]) Business Cell  [phone]) Toll Free Office

## 2016-09-04 ENCOUNTER — Other Ambulatory Visit: Payer: Self-pay | Admitting: *Deleted

## 2016-09-04 NOTE — Patient Outreach (Signed)
[facility]) Care Management  09/04/2016  Angela Keith 1930-01-25 [id number]   Met with [place] and Markus Daft, [place] director regarding patient. Patient is making progress and the original plan and goal was to go home with hired help, now family is considering LTC or ALF placement. Care plan meeting scheduled for tomorrow.  Requested that facility call RNCM if patient to discharge home, they agree.  Plan to follow up on discharge planning needs.  Royetta Crochet. Laymond Purser, RN, BSN, [place] [phone]) Business Cell  [phone]) Toll Free Office

## 2016-09-07 LAB — CUP PACEART REMOTE DEVICE CHECK
Date Time Interrogation Session: 20180205031055
MDC IDC PG IMPLANT DT: 20160714

## 2016-09-07 NOTE — Progress Notes (Signed)
Carelink summary report received. Battery status OK. Normal device function. No new symptom episodes, brady, or pause episodes. 5 tachy-1 appears AT/AF w/ RVR? others appear artifact with oversensing. ECGs appear 39 AF 0.6% +eliquis, some ECGs appear SR w/ PVCs/PACs. Monthly summary reports and ROV/PRN

## 2016-09-10 ENCOUNTER — Encounter: Payer: Self-pay | Admitting: Internal Medicine

## 2016-09-10 ENCOUNTER — Non-Acute Institutional Stay (SKILLED_NURSING_FACILITY): Payer: Medicare Other | Admitting: Internal Medicine

## 2016-09-10 ENCOUNTER — Ambulatory Visit (INDEPENDENT_AMBULATORY_CARE_PROVIDER_SITE_OTHER): Payer: Medicare Other | Admitting: *Deleted

## 2016-09-10 DIAGNOSIS — I639 Cerebral infarction, unspecified: Secondary | ICD-10-CM | POA: Diagnosis not present

## 2016-09-10 DIAGNOSIS — B952 Enterococcus as the cause of diseases classified elsewhere: Secondary | ICD-10-CM

## 2016-09-10 DIAGNOSIS — N390 Urinary tract infection, site not specified: Secondary | ICD-10-CM | POA: Diagnosis not present

## 2016-09-10 NOTE — Progress Notes (Signed)
Location:  [facility] Room Number: 106P Place of Service:  SNF (31)  Randon Goldsmith. Lyn Hollingshead, MD  Patient Care Team: Marden Noble, MD as PCP - General (Internal Medicine) [facility] ([facility] Facility)  Extended Emergency Contact Information Primary Emergency Contact: Lala Lund Address: [address]          [place], [place] [address] [place] of [place] Home Phone: [phone] Work Phone: [phone] Mobile Phone: [phone] Relation: Son Secondary Emergency Contact: [place], [place] [address] D[place] of [place] Home Phone: [phone] Mobile Phone: [phone] Relation: Relative    Allergies: Ace inhibitors; Lipitor [atorvastatin]; and Motrin [ibuprofen]  Chief Complaint  Patient presents with  . Acute Visit    Acute    HPI: Patient is 83 y.o. female who is being seen acutely for an acute UTI. Urine was sent several days ago when pt and decreased MS and appetite. MIC returned today.  Past Medical History:  Diagnosis Date  . Arthritis   . Atrial fibrillation (HCC)   . Chronic neck pain   . DJD (degenerative joint disease), cervical   . DJD (degenerative joint disease), lumbar   . Dysphagia   . GERD (gastroesophageal reflux disease)   . Glaucoma   . HTN (hypertension) 10/30/2012  . Hyperlipidemia   . Hypothyroidism   . Insomnia   . Nocturia   . Right bundle branch block 03/21/2014  . Rotoscoliosis   . Severe hearing loss   . Shortness of breath dyspnea   . Stroke (HCC)   . Unspecified hypothyroidism 10/30/2012    Past Surgical History:  Procedure Laterality Date  . BACK SURGERY     lower back   . bladder laser surgery    . BREAST SURGERY    . cataract surgery    . EP IMPLANTABLE DEVICE N/A 01/19/2015   Procedure: Loop Recorder Insertion;  Surgeon: Marinus Maw, MD;  Location: [facility] INVASIVE CV LAB;  Service: Cardiovascular;  Laterality: N/A;  . ESOPHAGOGASTRODUODENOSCOPY (EGD)  WITH ESOPHAGEAL DILATION  06/08/2012   Procedure: ESOPHAGOGASTRODUODENOSCOPY (EGD) WITH ESOPHAGEAL DILATION;  Surgeon: Charolett Bumpers, MD;  Location: [facility] ENDOSCOPY;  Service: Endoscopy;  Laterality: N/A;  . facail surgery  2000   facelift and eyelid lift  . left carpal tunnel surgery    . LUMBAR LAMINECTOMY/DECOMPRESSION MICRODISCECTOMY N/A 01/17/2015   Procedure: Laminectomy and Foraminotomy - L2-L3 - L3-L4;  Surgeon: [name], MD;  Location: [facility] NEURO ORS;  Service: Neurosurgery;  Laterality: N/A;  Laminectomy and Foraminotomy - L2-L3 - L3-L4  . TEE WITHOUT CARDIOVERSION N/A 01/19/2015   Procedure: TRANSESOPHAGEAL ECHOCARDIOGRAM (TEE);  Surgeon: Chrystie Nose, MD;  Location: [facility] ENDOSCOPY;  Service: Cardiovascular;  Laterality: N/A;    Allergies as of 09/10/2016      Reactions   Ace Inhibitors Other (See Comments)   Severe fatigue   Lipitor [atorvastatin] Other (See Comments)   Mood swings   Motrin [ibuprofen] Nausea And Vomiting      Medication List       Accurate as of 09/10/16  2:03 PM. Always use your most recent med list.          apixaban 5 MG Tabs tablet Commonly known as:  ELIQUIS Take 1 tablet (5 mg total) by mouth 2 (two) times daily.   aspirin 81 MG EC tablet Take 1 tablet (81 mg total) by mouth daily.   dorzolamide-timolol 22.3-6.8 MG/ML ophthalmic solution Commonly known  as:  COSOPT Place 1 drop into both eyes 2 (two) times daily.   gabapentin 100 MG capsule Commonly known as:  NEURONTIN Take 100 mg by mouth 2 (two) times daily. At breakfast and lunch   HYDROcodone-acetaminophen 5-325 MG tablet Commonly known as:  NORCO/VICODIN Take 1 tablet by mouth 2 (two) times daily as needed for moderate pain or severe pain.   latanoprost 0.005 % ophthalmic solution Commonly known as:  XALATAN Place 1 drop into both eyes at bedtime.   levothyroxine 88 MCG tablet Commonly known as:  SYNTHROID, LEVOTHROID Take 88 mcg by mouth daily before breakfast.   lidocaine 5  % Commonly known as:  LIDODERM Place 1 patch onto the skin daily. Left lateral thigh  Remove & Discard patch within 12 hours or as directed by MD   methocarbamol 500 MG tablet Commonly known as:  ROBAXIN Take 500 mg by mouth every 6 (six) hours as needed for muscle spasms.   metoprolol tartrate 25 MG tablet Commonly known as:  LOPRESSOR Take 25 mg by mouth 2 (two) times daily.   mirtazapine 7.5 MG tablet Commonly known as:  REMERON Take 7.5 mg by mouth at bedtime.   NUTRITIONAL SUPPLEMENTS PO Take Medpass 4 oz by mouth 2 times daily to support nutritional needs   pantoprazole 40 MG tablet Commonly known as:  PROTONIX Take 40 mg by mouth 2 (two) times daily.   pravastatin 20 MG tablet Commonly known as:  PRAVACHOL Take 1 tablet (20 mg total) by mouth daily at 6 PM.   QUEtiapine 25 MG tablet Commonly known as:  SEROQUEL Take 25 mg by mouth at bedtime.   tamsulosin 0.4 MG Caps capsule Commonly known as:  FLOMAX Take 0.4 mg by mouth daily.   TYLENOL 325 MG tablet Generic drug:  acetaminophen Take 650 mg by mouth every 6 (six) hours as needed for mild pain or moderate pain.   vitamin B-12 1000 MCG tablet Commonly known as:  CYANOCOBALAMIN Take 1,000 mcg by mouth daily.       No orders of the defined types were placed in this encounter.   Immunization History  Administered Date(s) Administered  . PPD Test 12/26/2015, 08/09/2016    Social History  Substance Use Topics  . Smoking status: Never Smoker  . Smokeless tobacco: Never Used  . Alcohol use No    Review of Systems  DATA OBTAINED: from patient - can not fully participate; nursing without cocnerns GENERAL:  no fevers, fatigue, +appetite changes SKIN: No itching, rash HEENT: No complaint RESPIRATORY: No cough, wheezing, SOB CARDIAC: No chest pain, palpitations, lower extremity edema  GI: No abdominal pain, No N/V/D or constipation, No heartburn or reflux  GU: No dysuria, frequency or urgency, or  incontinence  MUSCULOSKELETAL: No unrelieved bone/joint pain NEUROLOGIC: No headache, dizziness  PSYCHIATRIC: No overt anxiety or sadness  Vitals:   09/10/16 1402  BP: 126/88  Pulse: 84  Resp: 16  Temp: 97.6 F (36.4 C)   Body mass index is 22.31 kg/m. Physical Exam  GENERAL APPEARANCE: Alert, non conversant, No acute distress  SKIN: No diaphoresis rash HEENT: Unremarkable RESPIRATORY: Breathing is even, unlabored. Lung sounds are clear   CARDIOVASCULAR: Heart RRR no murmurs, rubs or gallops. No peripheral edema  GASTROINTESTINAL: Abdomen is soft, non-tender, not distended w/ normal bowel sounds.  GENITOURINARY: Bladder non tender, not distended  MUSCULOSKELETAL: No abnormal joints or musculature NEUROLOGIC: Cranial nerves 2-12 grossly intact except R facial weakness; L ext weakness old PSYCHIATRIC: Mood and  affect different, no behavioral issues  Patient Active Problem List   Diagnosis Date Noted  . Somnolence, daytime   . Acute ischemic left MCA stroke (HCC) 08/06/2016  . Acute urinary retention 08/06/2016  . Aphasia   . Depression 02/03/2016  . Insomnia 02/03/2016  . Chronic musculoskeletal pain 02/03/2016  . GERD (gastroesophageal reflux disease) 12/31/2015  . Pressure ulcer 12/23/2015  . Atrial fibrillation (HCC) 06/06/2015  . Embolic stroke (HCC) 03/22/2015  . Left hemiparesis (HCC)   . CVA (cerebral vascular accident) (HCC)   . H/O TIA (transient ischemic attack) and stroke   . Spinal stenosis, lumbar region, with neurogenic claudication 01/17/2015  . Lumbar stenosis 01/17/2015  . 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 Essential hypertension 11/10/2014  . HLD (hyperlipidemia) 11/10/2014  . Chronic back pain 11/08/2014  . Cerebral infarction due to thrombosis of cerebral artery (HCC)   . Hyperlipidemia LDL goal <70 09/07/2014  . Presumed CVA (cerebral infarction) s/p IV tPA, MRI negative 09/05/2014  . Hypertensive urgency 09/05/2014  . Right  bundle branch block 03/21/2014  . Cough 10/31/2012  . Elevated lipase 10/30/2012  . HTN (hypertension) 10/30/2012  . Hypothyroidism 10/30/2012    CMP     Component Value Date/Time   NA 142 08/14/2016   K 3.8 08/14/2016   CL 102 08/08/2016 1216   CO2 25 08/08/2016 1216   GLUCOSE 95 08/08/2016 1216   BUN 23 (A) 08/14/2016   CREATININE 0.7 08/14/2016   CREATININE 0.78 08/08/2016 1216   CALCIUM 9.2 08/08/2016 1216   PROT 7.5 08/06/2016 0759   ALBUMIN 3.9 08/06/2016 0759   AST 22 08/06/2016 0759   ALT 18 08/06/2016 0759   ALKPHOS 65 08/06/2016 0759   BILITOT 0.3 08/06/2016 0759   GFRNONAA >60 08/08/2016 1216   GFRAA >60 08/08/2016 1216    Recent Labs  12/24/15 1025  08/06/16 0759 08/06/16 0812 08/08/16 1216 08/14/16  NA 140  < > 142  142 145  145 138 142  K 3.7  < > 4.0  4.0 3.9  3.9 4.2 3.8  CL 108  --  107 103 102  --   CO2 28  --  31  --  25  --   GLUCOSE 129*  --  105* 103* 95  --   BUN 11  < > 15  15 19  19 14  23*  CREATININE 0.78  < > 0.93  0.9 1.00  1.0 0.78 0.7  CALCIUM 8.5*  --  9.7  --  9.2  --   < > = values in this interval not displayed.  Recent Labs  12/22/15 1654 12/23/15 0516 08/06/16 0759  AST 42* 33 22  ALT 23 19 18   ALKPHOS 62 51 65  BILITOT 0.8 1.0 0.3  PROT 6.9 6.1* 7.5  ALBUMIN 4.1 3.4* 3.9    Recent Labs  12/22/15 1654  12/23/15 0516 01/08/16 08/06/16 0759 08/06/16 0812 08/14/16  WBC 9.4  < > 6.9 5.0 6.6  6.6  --  7.4  NEUTROABS 6.9  --   --   --  3.3  --   --   HGB 13.2  --  11.9* 12.1 12.8  12.8 14.3 14.0  HCT 40.3  --  36.0 38 40.3  40 42.0 44  MCV 87.2  --  86.1  --  90.0  --   --   PLT 283  --  243 244 246  246  --  268  < > = values in  this interval not displayed.  Recent Labs  08/07/16 0715  CHOL 180  LDLCALC 118*  TRIG 40   [facility] results found [facility]: [facility] Results  Component Value Date   TSH 0.690 12/23/2015   Lab Results  Component Value Date   HGBA1C 5.8 (H) 08/07/2016   Lab Results    Component Value Date   CHOL 180 08/07/2016   HDL 54 08/07/2016   LDLCALC 118 (H) 08/07/2016   TRIG 40 08/07/2016   CHOLHDL 3.3 08/07/2016    Significant Diagnostic Results in last 30 days:  No results found.  Assessment and Plan  ENTEROCOCCUS UTI- pt with > 100,000 enterococcus faecalis and > 75,000 E Coli ; pt placed on cipro 500 mg BID for 7 days; will monitor response     Thurston HoleAnne D. Lyn HollingsheadAlexander, MD

## 2016-09-11 NOTE — Progress Notes (Signed)
Carelink Summary Report / Loop Recorder 

## 2016-09-13 ENCOUNTER — Other Ambulatory Visit: Payer: Self-pay | Admitting: *Deleted

## 2016-09-13 NOTE — Patient Outreach (Signed)
Spoke with Lowella BandyNikki, admissions coordinator at facility. She reports that family does plan to take patient home upon discharge. No discharge date has been set as of yet.  Plan to [facility] with [facility].  Alben SpittleMary E. Albertha GheeNiemczura, RN, BSN, CCM  Post Acute Chartered loss adjusterCare Coordinator [facility] [phone]) Business Cell  [phone]) Toll Free Office

## 2016-09-17 ENCOUNTER — Other Ambulatory Visit: Payer: Self-pay | Admitting: *Deleted

## 2016-09-17 NOTE — Patient Outreach (Signed)
[name]) Care Management  09/17/2016  [name] [birth year] [id number]   Met with patient at facility, patient states to speak with daughter in law. She is not sure what services her family has planned for when she discharges. Patient unsure of discharge date.  RNCM left [facility] brochure with RNCM contact at bedside for patient to review and to give to daughter in law for review.    Spoke with [facility], admissions as SW is new and unsure of patient discharge plan. She reports no discharge is set as of today, will have update later in week.   Plan to follow up with daughter in law Beatrice Lecher regarding [facility] care management services.   Royetta Crochet. Laymond Purser, RN, BSN, [place] [phone]) Business Cell  [phone]) Toll Free Office

## 2016-09-19 ENCOUNTER — Other Ambulatory Visit: Payer: Self-pay | Admitting: *Deleted

## 2016-09-19 NOTE — Patient Outreach (Signed)
Incoming call from Roxy Horsemanena Barnes, patient daughter in law. [facility].  Daughter in law states she is a retired Engineer, civil (consulting)nurse, she cared for patient after her first stroke and paitent lived with them for a few months, until she was able to move back to her own home.  She states plan is for patient to return home, she lives next door, patient states "I don't want to be a bother". She states that patient can live with her and patient son again if need be until patient can return home safely.   Patient was living alone and had a private pay caregiver. She states they will be looking for more help. RNCM offered home care providers number, she declined stating they have a friend they are looking to hire.  She hopes that patient will be able to stay at facility for a couple more weeks as they have to go out of town to assist family who had surgery.   Daughter in law agrees to contact RNCM or [facility] for future needs. She appreciates knowing about the resource. She does not feel they want to take advantage of program at this time.   Plan to sign off at this time, will be happy to re-assess at a later time.   Alben SpittleMary E. Albertha GheeNiemczura, RN, BSN, CCM  Post Acute Chartered loss adjusterCare Coordinator [facility] [phone]) Business Cell  [phone]) Toll Free Office

## 2016-09-21 ENCOUNTER — Encounter: Payer: Self-pay | Admitting: Internal Medicine

## 2016-09-23 NOTE — Progress Notes (Signed)
[facility] on 08[facility]2016. Medication reconciliation completed by PharmD with [facility] on 09/23/2016. No issues noted.   Sandi CarneNick Kobey Sides, PharmD, BCPS Pharmacy Resident Pager: [phone]

## 2016-09-24 ENCOUNTER — Non-Acute Institutional Stay (SKILLED_NURSING_FACILITY): Payer: Medicare Other | Admitting: Internal Medicine

## 2016-09-24 ENCOUNTER — Encounter: Payer: Self-pay | Admitting: Internal Medicine

## 2016-09-24 DIAGNOSIS — E785 Hyperlipidemia, unspecified: Secondary | ICD-10-CM

## 2016-09-24 DIAGNOSIS — I480 Paroxysmal atrial fibrillation: Secondary | ICD-10-CM

## 2016-09-24 DIAGNOSIS — F32A Depression, unspecified: Secondary | ICD-10-CM

## 2016-09-24 DIAGNOSIS — Z8673 Personal history of transient ischemic attack (TIA), and cerebral infarction without residual deficits: Secondary | ICD-10-CM

## 2016-09-24 DIAGNOSIS — I10 Essential (primary) hypertension: Secondary | ICD-10-CM | POA: Diagnosis not present

## 2016-09-24 DIAGNOSIS — I63512 Cerebral infarction due to unspecified occlusion or stenosis of left middle cerebral artery: Secondary | ICD-10-CM

## 2016-09-24 DIAGNOSIS — G8194 Hemiplegia, unspecified affecting left nondominant side: Secondary | ICD-10-CM | POA: Diagnosis not present

## 2016-09-24 DIAGNOSIS — E034 Atrophy of thyroid (acquired): Secondary | ICD-10-CM | POA: Diagnosis not present

## 2016-09-24 DIAGNOSIS — R338 Other retention of urine: Secondary | ICD-10-CM

## 2016-09-24 DIAGNOSIS — M48062 Spinal stenosis, lumbar region with neurogenic claudication: Secondary | ICD-10-CM

## 2016-09-24 DIAGNOSIS — F329 Major depressive disorder, single episode, unspecified: Secondary | ICD-10-CM

## 2016-09-24 NOTE — Progress Notes (Signed)
[facility]  Margit HanksAnne D Gracieann Stannard, MD Place of Service:  SNF (31)SNF  PCP: Pearla DubonnetGATES,[name], MD Patient Care Team: Marden Nobleobert Gates, MD as PCP - General (Internal Medicine) [facility]  Extended Emergency Contact Information Primary Emergency Contact: Lala LundBarnes,B J Address: [address]          [place], [place] [address] [place] Home Phone: [phone] Work Phone: [phone] Mobile Phone: [phone] Relation: Son Secondary Emergency Contact: Collier SalinaBarnes,Dena          [place], [place] [address] [place] Home Phone: [phone] Mobile Phone: [phone] Relation: Relative  Allergies  Allergen Reactions  . Ace Inhibitors Other (See Comments)    Severe fatigue  . Lipitor [Atorvastatin] Other (See Comments)    Mood swings  . Motrin [Ibuprofen] Nausea And Vomiting    Chief Complaint  Patient presents with  . Discharge Note   HPI- Pt is 83yo femALE with CVA in 2016 with left-sided deficits that are persistent and associated with left lower extremity spasticity, hypertension, hypothyroidism, atrial fibrillation on eliquis, spinal stenosis with neurogenic claudication, dyslipidemia, GERD and insomnia. Last seen normal around 5:30 yesterday evening. Caregiver at the home noted this morning patient has had 2-3 episodes of dysarthria associated right facial drooping. In addition, patient was attempting to use the restroom and upon standing to try to pull up her undergarments she slid to the floor. She denied dizziness. She has had recent issues with worsening spasticity of the left lower extremity over the past several days. EMS was called to the home and by the time they arrived the symptoms had resolved. Upon patient to the ER symptoms had recurred. Code stroke was initiated. Neurology has seen the patient and deemed her not appropriate for TPA based on severity of symptoms and concurrent  utilization of eliquis anticoagulation. Imaging consistent with likely left MCA territory CVA. [facility] where pt's risk facors were identified and tx started. Hospital course was complicated by acute urinary retention which according to family occurred during prior stroke as well. Pt was admitted to SNF for OT/PT and pt is now ready to go home.      Past Medical History:  Diagnosis Date  . Arthritis   . Atrial fibrillation (HCC)   . Chronic neck pain   . DJD (degenerative joint disease), cervical   . DJD (degenerative joint disease), lumbar   . Dysphagia   . GERD (gastroesophageal reflux disease)   . Glaucoma   . HTN (hypertension) 10/30/2012  . Hyperlipidemia   . Hypothyroidism   . Insomnia   . Nocturia   . Right bundle branch block 03/21/2014  . Rotoscoliosis   . Severe hearing loss   . Shortness of breath dyspnea   . Stroke (HCC)   . Unspecified hypothyroidism 10/30/2012    Past Surgical History:  Procedure Laterality Date  . BACK SURGERY     lower back   . bladder laser surgery    . BREAST SURGERY    . cataract surgery    . EP IMPLANTABLE DEVICE N/A 01/19/2015   Procedure: Loop Recorder Insertion;  Surgeon: Marinus MawGregg W Taylor, MD;  [facility];  Service: Cardiovascular;  Laterality: N/A;  . ESOPHAGOGASTRODUODENOSCOPY (EGD) WITH ESOPHAGEAL DILATION  06/08/2012   Procedure: ESOPHAGOGASTRODUODENOSCOPY (EGD) WITH ESOPHAGEAL DILATION;  Surgeon: Charolett BumpersMartin K Johnson, MD;  Location: [facility] ENDOSCOPY;  Service: Endoscopy;  Laterality: N/A;  . facail surgery  2000   facelift and eyelid lift  . left carpal tunnel surgery    . LUMBAR LAMINECTOMY/DECOMPRESSION MICRODISCECTOMY N/A 01/17/2015   Procedure: Laminectomy and Foraminotomy - L2-L3 - L3-L4;  Surgeon: [name], MD;  Location: [facility] NEURO ORS;  Service: Neurosurgery;  Laterality: N/A;  Laminectomy and Foraminotomy - L2-L3 - L3-L4  . TEE WITHOUT CARDIOVERSION N/A 01/19/2015   Procedure:  TRANSESOPHAGEAL ECHOCARDIOGRAM (TEE);  Surgeon: Chrystie Nose, MD;  Location: [facility] ENDOSCOPY;  Service: Cardiovascular;  Laterality: N/A;     reports that she has never smoked. She has never used smokeless tobacco. She reports that she does not drink alcohol or use drugs. Social History   Social History  . Marital status: Widowed    Spouse name: N/A  . Number of children: 2  . Years of education: 10   Occupational History  . retired     Veterinary surgeon, office work   Social History Main Topics  . Smoking status: Never Smoker  . Smokeless tobacco: Never Used  . Alcohol use No  . Drug use: No  . Sexual activity: No   Other Topics Concern  . Not on file   Social History Narrative   Widowed, 2 sons   Right handed   caffeine use- none    Pertinent  Health Maintenance Due  Topic Date Due  . INFLUENZA VACCINE  02/06/2016  . DEXA SCAN  07/09/2023 (Originally 05/30/1997)  . PNA vac Low Risk Adult (1 of 2 - PCV13) 07/09/2023 (Originally 05/30/1997)    Medications: Allergies as of 09/24/2016      Reactions   Ace Inhibitors Other (See Comments)   Severe fatigue   Lipitor [atorvastatin] Other (See Comments)   Mood swings   Motrin [ibuprofen] Nausea And Vomiting      Medication List       Accurate as of 09/24/16  1:05 PM. Always use your most recent med list.          apixaban 5 MG Tabs tablet Commonly known as:  ELIQUIS Take 1 tablet (5 mg total) by mouth 2 (two) times daily.   aspirin 81 MG EC tablet Take 1 tablet (81 mg total) by mouth daily.   dorzolamide-timolol 22.3-6.8 MG/ML ophthalmic solution Commonly known as:  COSOPT Place 1 drop into both eyes 2 (two) times daily.   gabapentin 100 MG capsule Commonly known as:  NEURONTIN Take 100 mg by mouth 2 (two) times daily. At breakfast and lunch   HYDROcodone-acetaminophen 5-325 MG tablet Commonly known as:  NORCO/VICODIN Take 1 tablet by mouth 2 (two) times daily as needed for moderate pain or severe pain.     latanoprost 0.005 % ophthalmic solution Commonly known as:  XALATAN Place 1 drop into both eyes at bedtime.   levothyroxine 88 MCG tablet Commonly known as:  SYNTHROID, LEVOTHROID Take 88 mcg by mouth daily before breakfast.   lidocaine 5 % Commonly known as:  LIDODERM Place 1 patch onto the skin daily. Left lateral thigh  Remove & Discard patch within 12 hours or as directed by MD   methocarbamol 500 MG tablet Commonly known as:  ROBAXIN Take 500 mg by mouth every 6 (six) hours as needed for muscle spasms.   metoprolol tartrate 25 MG tablet Commonly known as:  LOPRESSOR Take 25 mg by mouth 2 (two) times daily.   mirtazapine 7.5 MG tablet Commonly known as:  REMERON Take 7.5 mg by mouth at bedtime.   NUTRITIONAL SUPPLEMENTS PO Take Medpass 4 oz by  mouth 2 times daily to support nutritional needs   pantoprazole 40 MG tablet Commonly known as:  PROTONIX Take 40 mg by mouth 2 (two) times daily.   pravastatin 20 MG tablet Commonly known as:  PRAVACHOL Take 1 tablet (20 mg total) by mouth daily at 6 PM.   QUEtiapine 25 MG tablet Commonly known as:  SEROQUEL Take 25 mg by mouth at bedtime.   tamsulosin 0.4 MG Caps capsule Commonly known as:  FLOMAX Take 0.4 mg by mouth daily.   TYLENOL 325 MG tablet Generic drug:  acetaminophen Take 650 mg by mouth every 6 (six) hours as needed for mild pain or moderate pain.   vitamin B-12 1000 MCG tablet Commonly known as:  CYANOCOBALAMIN Take 1,000 mcg by mouth daily.        Vitals:   09/24/16 1302  BP: 128/73  Pulse: 68  Resp: 18  Temp: 97.9 F (36.6 C)   There is no height or weight on file to calculate BMI.  Physical Exam  GENERAL APPEARANCE: Alert, conversant. No acute distress.  HEENT: Unremarkable. RESPIRATORY: Breathing is even, unlabored. Lung sounds are clear   CARDIOVASCULAR: Heart IRREG no murmurs, rubs or gallops. No peripheral edema.  GASTROINTESTINAL: Abdomen is soft, non-tender, not distended w/  normal bowel sounds.  NEUROLOGIC: Cranial nerves 2-12 grossly intact X R FACIAL WEAKNESS; L side weakness   Labs reviewed: Basic Metabolic Panel:  Recent Labs  16/10/96 1025  08/06/16 0759 08/06/16 0812 08/08/16 1216 08/14/16  NA 140  < > 142  142 145  145 138 142  K 3.7  < > 4.0  4.0 3.9  3.9 4.2 3.8  CL 108  --  107 103 102  --   CO2 28  --  31  --  25  --   GLUCOSE 129*  --  105* 103* 95  --   BUN 11  < > 15  15 19  19 14  23*  CREATININE 0.78  < > 0.93  0.9 1.00  1.0 0.78 0.7  CALCIUM 8.5*  --  9.7  --  9.2  --   < > = values in this interval not displayed. No results found for: [facility] Liver Function Tests:  Recent Labs  12/22/15 1654 12/23/15 0516 08/06/16 0759  AST 42* 33 22  ALT 23 19 18   ALKPHOS 62 51 65  BILITOT 0.8 1.0 0.3  PROT 6.9 6.1* 7.5  ALBUMIN 4.1 3.4* 3.9   No results for input(s): LIPASE, AMYLASE in the last 8760 hours. No results for input(s): AMMONIA in the last 8760 hours. CBC:  Recent Labs  12/22/15 1654  12/23/15 0516 01/08/16 08/06/16 0759 08/06/16 0812 08/14/16  WBC 9.4  < > 6.9 5.0 6.6  6.6  --  7.4  NEUTROABS 6.9  --   --   --  3.3  --   --   HGB 13.2  --  11.9* 12.1 12.8  12.8 14.3 14.0  HCT 40.3  --  36.0 38 40.3  40 42.0 44  MCV 87.2  --  86.1  --  90.0  --   --   PLT 283  --  243 244 246  246  --  268  < > = values in this interval not displayed. Lipid  Recent Labs  08/07/16 0715  CHOL 180  HDL 54  LDLCALC 118*  TRIG 40   Cardiac Enzymes:  Recent Labs  12/22/15 1654 12/23/15 0516 12/24/15 1025  CKTOTAL 1,107* 608*  284*   BNP: No results for input(s): BNP in the last 8760 hours. CBG: No results for input(s): GLUCAP in the last 8760 hours.  Procedures and Imaging Studies During Stay: No results found.  Assessment/Plan:   No diagnosis found.   Patient is being discharged with the following home health services:  OT/PT/Nursing  Patient is being discharged with the following durable  medical equipment:  none  Patient has been advised to f/u with their PCP in 1-2 weeks to bring them up to date on their rehab stay.  Social services at facility was responsible for arranging this appointment.  Pt was provided with a 30 day supply of prescriptions for medications and refills must be obtained from their PCP.  For controlled substances, a more limited supply may be provided adequate until PCP appointment only.   Time spent > 30 min;> 50% of time with patient was spent reviewing records, labs, tests and studies, counseling and developing plan of care  Merrilee Seashore, MD

## 2016-09-26 DIAGNOSIS — R339 Retention of urine, unspecified: Secondary | ICD-10-CM | POA: Diagnosis not present

## 2016-09-29 DIAGNOSIS — I69354 Hemiplegia and hemiparesis following cerebral infarction affecting left non-dominant side: Secondary | ICD-10-CM | POA: Diagnosis not present

## 2016-09-29 DIAGNOSIS — M47816 Spondylosis without myelopathy or radiculopathy, lumbar region: Secondary | ICD-10-CM | POA: Diagnosis not present

## 2016-09-29 DIAGNOSIS — Z7901 Long term (current) use of anticoagulants: Secondary | ICD-10-CM | POA: Diagnosis not present

## 2016-09-29 DIAGNOSIS — I4891 Unspecified atrial fibrillation: Secondary | ICD-10-CM | POA: Diagnosis not present

## 2016-09-29 DIAGNOSIS — Z466 Encounter for fitting and adjustment of urinary device: Secondary | ICD-10-CM | POA: Diagnosis not present

## 2016-09-29 DIAGNOSIS — I10 Essential (primary) hypertension: Secondary | ICD-10-CM | POA: Diagnosis not present

## 2016-09-29 DIAGNOSIS — M48062 Spinal stenosis, lumbar region with neurogenic claudication: Secondary | ICD-10-CM | POA: Diagnosis not present

## 2016-09-29 DIAGNOSIS — I69322 Dysarthria following cerebral infarction: Secondary | ICD-10-CM | POA: Diagnosis not present

## 2016-09-29 DIAGNOSIS — R339 Retention of urine, unspecified: Secondary | ICD-10-CM | POA: Diagnosis not present

## 2016-09-29 DIAGNOSIS — M47812 Spondylosis without myelopathy or radiculopathy, cervical region: Secondary | ICD-10-CM | POA: Diagnosis not present

## 2016-09-29 DIAGNOSIS — H547 Unspecified visual loss: Secondary | ICD-10-CM | POA: Diagnosis not present

## 2016-09-29 DIAGNOSIS — H409 Unspecified glaucoma: Secondary | ICD-10-CM | POA: Diagnosis not present

## 2016-10-01 DIAGNOSIS — I69322 Dysarthria following cerebral infarction: Secondary | ICD-10-CM | POA: Diagnosis not present

## 2016-10-01 DIAGNOSIS — M48062 Spinal stenosis, lumbar region with neurogenic claudication: Secondary | ICD-10-CM | POA: Diagnosis not present

## 2016-10-01 DIAGNOSIS — I10 Essential (primary) hypertension: Secondary | ICD-10-CM | POA: Diagnosis not present

## 2016-10-01 DIAGNOSIS — Z466 Encounter for fitting and adjustment of urinary device: Secondary | ICD-10-CM | POA: Diagnosis not present

## 2016-10-01 DIAGNOSIS — I69354 Hemiplegia and hemiparesis following cerebral infarction affecting left non-dominant side: Secondary | ICD-10-CM | POA: Diagnosis not present

## 2016-10-01 DIAGNOSIS — I4891 Unspecified atrial fibrillation: Secondary | ICD-10-CM | POA: Diagnosis not present

## 2016-10-02 DIAGNOSIS — M48062 Spinal stenosis, lumbar region with neurogenic claudication: Secondary | ICD-10-CM | POA: Diagnosis not present

## 2016-10-02 DIAGNOSIS — I69322 Dysarthria following cerebral infarction: Secondary | ICD-10-CM | POA: Diagnosis not present

## 2016-10-02 DIAGNOSIS — Z466 Encounter for fitting and adjustment of urinary device: Secondary | ICD-10-CM | POA: Diagnosis not present

## 2016-10-02 DIAGNOSIS — I4891 Unspecified atrial fibrillation: Secondary | ICD-10-CM | POA: Diagnosis not present

## 2016-10-02 DIAGNOSIS — I10 Essential (primary) hypertension: Secondary | ICD-10-CM | POA: Diagnosis not present

## 2016-10-02 DIAGNOSIS — I69354 Hemiplegia and hemiparesis following cerebral infarction affecting left non-dominant side: Secondary | ICD-10-CM | POA: Diagnosis not present

## 2016-10-03 DIAGNOSIS — M48062 Spinal stenosis, lumbar region with neurogenic claudication: Secondary | ICD-10-CM | POA: Diagnosis not present

## 2016-10-03 DIAGNOSIS — I10 Essential (primary) hypertension: Secondary | ICD-10-CM | POA: Diagnosis not present

## 2016-10-03 DIAGNOSIS — I4891 Unspecified atrial fibrillation: Secondary | ICD-10-CM | POA: Diagnosis not present

## 2016-10-03 DIAGNOSIS — I69322 Dysarthria following cerebral infarction: Secondary | ICD-10-CM | POA: Diagnosis not present

## 2016-10-03 DIAGNOSIS — Z466 Encounter for fitting and adjustment of urinary device: Secondary | ICD-10-CM | POA: Diagnosis not present

## 2016-10-03 DIAGNOSIS — I69354 Hemiplegia and hemiparesis following cerebral infarction affecting left non-dominant side: Secondary | ICD-10-CM | POA: Diagnosis not present

## 2016-10-04 DIAGNOSIS — Z466 Encounter for fitting and adjustment of urinary device: Secondary | ICD-10-CM | POA: Diagnosis not present

## 2016-10-04 DIAGNOSIS — I4891 Unspecified atrial fibrillation: Secondary | ICD-10-CM | POA: Diagnosis not present

## 2016-10-04 DIAGNOSIS — I69354 Hemiplegia and hemiparesis following cerebral infarction affecting left non-dominant side: Secondary | ICD-10-CM | POA: Diagnosis not present

## 2016-10-04 DIAGNOSIS — I10 Essential (primary) hypertension: Secondary | ICD-10-CM | POA: Diagnosis not present

## 2016-10-04 DIAGNOSIS — I69322 Dysarthria following cerebral infarction: Secondary | ICD-10-CM | POA: Diagnosis not present

## 2016-10-04 DIAGNOSIS — M48062 Spinal stenosis, lumbar region with neurogenic claudication: Secondary | ICD-10-CM | POA: Diagnosis not present

## 2016-10-07 DIAGNOSIS — I4891 Unspecified atrial fibrillation: Secondary | ICD-10-CM | POA: Diagnosis not present

## 2016-10-07 DIAGNOSIS — I69322 Dysarthria following cerebral infarction: Secondary | ICD-10-CM | POA: Diagnosis not present

## 2016-10-07 DIAGNOSIS — Z466 Encounter for fitting and adjustment of urinary device: Secondary | ICD-10-CM | POA: Diagnosis not present

## 2016-10-07 DIAGNOSIS — M48062 Spinal stenosis, lumbar region with neurogenic claudication: Secondary | ICD-10-CM | POA: Diagnosis not present

## 2016-10-07 DIAGNOSIS — I10 Essential (primary) hypertension: Secondary | ICD-10-CM | POA: Diagnosis not present

## 2016-10-07 DIAGNOSIS — I69354 Hemiplegia and hemiparesis following cerebral infarction affecting left non-dominant side: Secondary | ICD-10-CM | POA: Diagnosis not present

## 2016-10-08 DIAGNOSIS — F324 Major depressive disorder, single episode, in partial remission: Secondary | ICD-10-CM | POA: Diagnosis not present

## 2016-10-08 DIAGNOSIS — M48062 Spinal stenosis, lumbar region with neurogenic claudication: Secondary | ICD-10-CM | POA: Diagnosis not present

## 2016-10-08 DIAGNOSIS — G890 Central pain syndrome: Secondary | ICD-10-CM | POA: Diagnosis not present

## 2016-10-08 DIAGNOSIS — N319 Neuromuscular dysfunction of bladder, unspecified: Secondary | ICD-10-CM | POA: Diagnosis not present

## 2016-10-08 DIAGNOSIS — I10 Essential (primary) hypertension: Secondary | ICD-10-CM | POA: Diagnosis not present

## 2016-10-08 DIAGNOSIS — I639 Cerebral infarction, unspecified: Secondary | ICD-10-CM | POA: Diagnosis not present

## 2016-10-08 DIAGNOSIS — I4891 Unspecified atrial fibrillation: Secondary | ICD-10-CM | POA: Diagnosis not present

## 2016-10-08 DIAGNOSIS — M62838 Other muscle spasm: Secondary | ICD-10-CM | POA: Diagnosis not present

## 2016-10-08 DIAGNOSIS — I69322 Dysarthria following cerebral infarction: Secondary | ICD-10-CM | POA: Diagnosis not present

## 2016-10-08 DIAGNOSIS — Z466 Encounter for fitting and adjustment of urinary device: Secondary | ICD-10-CM | POA: Diagnosis not present

## 2016-10-08 DIAGNOSIS — G4700 Insomnia, unspecified: Secondary | ICD-10-CM | POA: Diagnosis not present

## 2016-10-08 DIAGNOSIS — I69354 Hemiplegia and hemiparesis following cerebral infarction affecting left non-dominant side: Secondary | ICD-10-CM | POA: Diagnosis not present

## 2016-10-09 DIAGNOSIS — I4891 Unspecified atrial fibrillation: Secondary | ICD-10-CM | POA: Diagnosis not present

## 2016-10-09 DIAGNOSIS — Z466 Encounter for fitting and adjustment of urinary device: Secondary | ICD-10-CM | POA: Diagnosis not present

## 2016-10-09 DIAGNOSIS — M48062 Spinal stenosis, lumbar region with neurogenic claudication: Secondary | ICD-10-CM | POA: Diagnosis not present

## 2016-10-09 DIAGNOSIS — I69322 Dysarthria following cerebral infarction: Secondary | ICD-10-CM | POA: Diagnosis not present

## 2016-10-09 DIAGNOSIS — I69354 Hemiplegia and hemiparesis following cerebral infarction affecting left non-dominant side: Secondary | ICD-10-CM | POA: Diagnosis not present

## 2016-10-09 DIAGNOSIS — I10 Essential (primary) hypertension: Secondary | ICD-10-CM | POA: Diagnosis not present

## 2016-10-10 ENCOUNTER — Ambulatory Visit (INDEPENDENT_AMBULATORY_CARE_PROVIDER_SITE_OTHER): Payer: Medicare Other | Admitting: *Deleted

## 2016-10-10 DIAGNOSIS — I639 Cerebral infarction, unspecified: Secondary | ICD-10-CM

## 2016-10-10 DIAGNOSIS — I69322 Dysarthria following cerebral infarction: Secondary | ICD-10-CM | POA: Diagnosis not present

## 2016-10-10 DIAGNOSIS — M48062 Spinal stenosis, lumbar region with neurogenic claudication: Secondary | ICD-10-CM | POA: Diagnosis not present

## 2016-10-10 DIAGNOSIS — I4891 Unspecified atrial fibrillation: Secondary | ICD-10-CM | POA: Diagnosis not present

## 2016-10-10 DIAGNOSIS — Z466 Encounter for fitting and adjustment of urinary device: Secondary | ICD-10-CM | POA: Diagnosis not present

## 2016-10-10 DIAGNOSIS — I69354 Hemiplegia and hemiparesis following cerebral infarction affecting left non-dominant side: Secondary | ICD-10-CM | POA: Diagnosis not present

## 2016-10-10 DIAGNOSIS — I10 Essential (primary) hypertension: Secondary | ICD-10-CM | POA: Diagnosis not present

## 2016-10-11 DIAGNOSIS — M48062 Spinal stenosis, lumbar region with neurogenic claudication: Secondary | ICD-10-CM | POA: Diagnosis not present

## 2016-10-11 DIAGNOSIS — Z466 Encounter for fitting and adjustment of urinary device: Secondary | ICD-10-CM | POA: Diagnosis not present

## 2016-10-11 DIAGNOSIS — I4891 Unspecified atrial fibrillation: Secondary | ICD-10-CM | POA: Diagnosis not present

## 2016-10-11 DIAGNOSIS — I10 Essential (primary) hypertension: Secondary | ICD-10-CM | POA: Diagnosis not present

## 2016-10-11 DIAGNOSIS — I69322 Dysarthria following cerebral infarction: Secondary | ICD-10-CM | POA: Diagnosis not present

## 2016-10-11 DIAGNOSIS — I69354 Hemiplegia and hemiparesis following cerebral infarction affecting left non-dominant side: Secondary | ICD-10-CM | POA: Diagnosis not present

## 2016-10-11 NOTE — Progress Notes (Signed)
Carelink Summary Report / Loop Recorder 

## 2016-10-12 LAB — CUP PACEART REMOTE DEVICE CHECK
Date Time Interrogation Session: 20180307031651
Implantable Pulse Generator Implant Date: 20160714

## 2016-10-14 ENCOUNTER — Ambulatory Visit: Payer: Medicare Other | Admitting: Neurology

## 2016-10-14 DIAGNOSIS — I10 Essential (primary) hypertension: Secondary | ICD-10-CM | POA: Diagnosis not present

## 2016-10-14 DIAGNOSIS — I69354 Hemiplegia and hemiparesis following cerebral infarction affecting left non-dominant side: Secondary | ICD-10-CM | POA: Diagnosis not present

## 2016-10-14 DIAGNOSIS — Z466 Encounter for fitting and adjustment of urinary device: Secondary | ICD-10-CM | POA: Diagnosis not present

## 2016-10-14 DIAGNOSIS — I69322 Dysarthria following cerebral infarction: Secondary | ICD-10-CM | POA: Diagnosis not present

## 2016-10-14 DIAGNOSIS — M48062 Spinal stenosis, lumbar region with neurogenic claudication: Secondary | ICD-10-CM | POA: Diagnosis not present

## 2016-10-14 DIAGNOSIS — I4891 Unspecified atrial fibrillation: Secondary | ICD-10-CM | POA: Diagnosis not present

## 2016-10-15 DIAGNOSIS — I69322 Dysarthria following cerebral infarction: Secondary | ICD-10-CM | POA: Diagnosis not present

## 2016-10-15 DIAGNOSIS — I69354 Hemiplegia and hemiparesis following cerebral infarction affecting left non-dominant side: Secondary | ICD-10-CM | POA: Diagnosis not present

## 2016-10-15 DIAGNOSIS — M48062 Spinal stenosis, lumbar region with neurogenic claudication: Secondary | ICD-10-CM | POA: Diagnosis not present

## 2016-10-15 DIAGNOSIS — R339 Retention of urine, unspecified: Secondary | ICD-10-CM | POA: Diagnosis not present

## 2016-10-15 DIAGNOSIS — Z466 Encounter for fitting and adjustment of urinary device: Secondary | ICD-10-CM | POA: Diagnosis not present

## 2016-10-15 DIAGNOSIS — I10 Essential (primary) hypertension: Secondary | ICD-10-CM | POA: Diagnosis not present

## 2016-10-15 DIAGNOSIS — I4891 Unspecified atrial fibrillation: Secondary | ICD-10-CM | POA: Diagnosis not present

## 2016-10-16 DIAGNOSIS — I10 Essential (primary) hypertension: Secondary | ICD-10-CM | POA: Diagnosis not present

## 2016-10-16 DIAGNOSIS — Z466 Encounter for fitting and adjustment of urinary device: Secondary | ICD-10-CM | POA: Diagnosis not present

## 2016-10-16 DIAGNOSIS — I69322 Dysarthria following cerebral infarction: Secondary | ICD-10-CM | POA: Diagnosis not present

## 2016-10-16 DIAGNOSIS — I4891 Unspecified atrial fibrillation: Secondary | ICD-10-CM | POA: Diagnosis not present

## 2016-10-16 DIAGNOSIS — M48062 Spinal stenosis, lumbar region with neurogenic claudication: Secondary | ICD-10-CM | POA: Diagnosis not present

## 2016-10-16 DIAGNOSIS — I69354 Hemiplegia and hemiparesis following cerebral infarction affecting left non-dominant side: Secondary | ICD-10-CM | POA: Diagnosis not present

## 2016-10-17 DIAGNOSIS — I69322 Dysarthria following cerebral infarction: Secondary | ICD-10-CM | POA: Diagnosis not present

## 2016-10-17 DIAGNOSIS — I4891 Unspecified atrial fibrillation: Secondary | ICD-10-CM | POA: Diagnosis not present

## 2016-10-17 DIAGNOSIS — M48062 Spinal stenosis, lumbar region with neurogenic claudication: Secondary | ICD-10-CM | POA: Diagnosis not present

## 2016-10-17 DIAGNOSIS — Z466 Encounter for fitting and adjustment of urinary device: Secondary | ICD-10-CM | POA: Diagnosis not present

## 2016-10-17 DIAGNOSIS — I69354 Hemiplegia and hemiparesis following cerebral infarction affecting left non-dominant side: Secondary | ICD-10-CM | POA: Diagnosis not present

## 2016-10-17 DIAGNOSIS — I10 Essential (primary) hypertension: Secondary | ICD-10-CM | POA: Diagnosis not present

## 2016-10-19 LAB — CUP PACEART REMOTE DEVICE CHECK
Date Time Interrogation Session: 20180406041047
MDC IDC PG IMPLANT DT: 20160714

## 2016-10-19 NOTE — Progress Notes (Signed)
Carelink summary report received. Battery status OK. Normal device function. No new symptom episodes, tachy episodes, brady, or pause episodes. 79 AF 1.1% +eliquis, some appear SR w/ PACs, other ECG ?AF. See ECGs. Monthly summary reports and ROV/PRN

## 2016-10-21 DIAGNOSIS — I10 Essential (primary) hypertension: Secondary | ICD-10-CM | POA: Diagnosis not present

## 2016-10-21 DIAGNOSIS — M48062 Spinal stenosis, lumbar region with neurogenic claudication: Secondary | ICD-10-CM | POA: Diagnosis not present

## 2016-10-21 DIAGNOSIS — Z466 Encounter for fitting and adjustment of urinary device: Secondary | ICD-10-CM | POA: Diagnosis not present

## 2016-10-21 DIAGNOSIS — I4891 Unspecified atrial fibrillation: Secondary | ICD-10-CM | POA: Diagnosis not present

## 2016-10-21 DIAGNOSIS — I69322 Dysarthria following cerebral infarction: Secondary | ICD-10-CM | POA: Diagnosis not present

## 2016-10-21 DIAGNOSIS — I69354 Hemiplegia and hemiparesis following cerebral infarction affecting left non-dominant side: Secondary | ICD-10-CM | POA: Diagnosis not present

## 2016-10-22 DIAGNOSIS — I10 Essential (primary) hypertension: Secondary | ICD-10-CM | POA: Diagnosis not present

## 2016-10-22 DIAGNOSIS — M48062 Spinal stenosis, lumbar region with neurogenic claudication: Secondary | ICD-10-CM | POA: Diagnosis not present

## 2016-10-22 DIAGNOSIS — I69354 Hemiplegia and hemiparesis following cerebral infarction affecting left non-dominant side: Secondary | ICD-10-CM | POA: Diagnosis not present

## 2016-10-22 DIAGNOSIS — Z466 Encounter for fitting and adjustment of urinary device: Secondary | ICD-10-CM | POA: Diagnosis not present

## 2016-10-22 DIAGNOSIS — I69322 Dysarthria following cerebral infarction: Secondary | ICD-10-CM | POA: Diagnosis not present

## 2016-10-22 DIAGNOSIS — I4891 Unspecified atrial fibrillation: Secondary | ICD-10-CM | POA: Diagnosis not present

## 2016-10-23 DIAGNOSIS — Z466 Encounter for fitting and adjustment of urinary device: Secondary | ICD-10-CM | POA: Diagnosis not present

## 2016-10-23 DIAGNOSIS — I69322 Dysarthria following cerebral infarction: Secondary | ICD-10-CM | POA: Diagnosis not present

## 2016-10-23 DIAGNOSIS — M48062 Spinal stenosis, lumbar region with neurogenic claudication: Secondary | ICD-10-CM | POA: Diagnosis not present

## 2016-10-23 DIAGNOSIS — I10 Essential (primary) hypertension: Secondary | ICD-10-CM | POA: Diagnosis not present

## 2016-10-23 DIAGNOSIS — I69354 Hemiplegia and hemiparesis following cerebral infarction affecting left non-dominant side: Secondary | ICD-10-CM | POA: Diagnosis not present

## 2016-10-23 DIAGNOSIS — I4891 Unspecified atrial fibrillation: Secondary | ICD-10-CM | POA: Diagnosis not present

## 2016-10-24 DIAGNOSIS — I69354 Hemiplegia and hemiparesis following cerebral infarction affecting left non-dominant side: Secondary | ICD-10-CM | POA: Diagnosis not present

## 2016-10-24 DIAGNOSIS — M48062 Spinal stenosis, lumbar region with neurogenic claudication: Secondary | ICD-10-CM | POA: Diagnosis not present

## 2016-10-24 DIAGNOSIS — I10 Essential (primary) hypertension: Secondary | ICD-10-CM | POA: Diagnosis not present

## 2016-10-24 DIAGNOSIS — Z466 Encounter for fitting and adjustment of urinary device: Secondary | ICD-10-CM | POA: Diagnosis not present

## 2016-10-24 DIAGNOSIS — I69322 Dysarthria following cerebral infarction: Secondary | ICD-10-CM | POA: Diagnosis not present

## 2016-10-24 DIAGNOSIS — I4891 Unspecified atrial fibrillation: Secondary | ICD-10-CM | POA: Diagnosis not present

## 2016-10-25 DIAGNOSIS — Z466 Encounter for fitting and adjustment of urinary device: Secondary | ICD-10-CM | POA: Diagnosis not present

## 2016-10-25 DIAGNOSIS — M48062 Spinal stenosis, lumbar region with neurogenic claudication: Secondary | ICD-10-CM | POA: Diagnosis not present

## 2016-10-25 DIAGNOSIS — I10 Essential (primary) hypertension: Secondary | ICD-10-CM | POA: Diagnosis not present

## 2016-10-25 DIAGNOSIS — I69322 Dysarthria following cerebral infarction: Secondary | ICD-10-CM | POA: Diagnosis not present

## 2016-10-25 DIAGNOSIS — I69354 Hemiplegia and hemiparesis following cerebral infarction affecting left non-dominant side: Secondary | ICD-10-CM | POA: Diagnosis not present

## 2016-10-25 DIAGNOSIS — I4891 Unspecified atrial fibrillation: Secondary | ICD-10-CM | POA: Diagnosis not present

## 2016-10-28 DIAGNOSIS — I69354 Hemiplegia and hemiparesis following cerebral infarction affecting left non-dominant side: Secondary | ICD-10-CM | POA: Diagnosis not present

## 2016-10-28 DIAGNOSIS — I10 Essential (primary) hypertension: Secondary | ICD-10-CM | POA: Diagnosis not present

## 2016-10-28 DIAGNOSIS — M48062 Spinal stenosis, lumbar region with neurogenic claudication: Secondary | ICD-10-CM | POA: Diagnosis not present

## 2016-10-28 DIAGNOSIS — Z466 Encounter for fitting and adjustment of urinary device: Secondary | ICD-10-CM | POA: Diagnosis not present

## 2016-10-28 DIAGNOSIS — I4891 Unspecified atrial fibrillation: Secondary | ICD-10-CM | POA: Diagnosis not present

## 2016-10-28 DIAGNOSIS — I69322 Dysarthria following cerebral infarction: Secondary | ICD-10-CM | POA: Diagnosis not present

## 2016-10-29 DIAGNOSIS — I4891 Unspecified atrial fibrillation: Secondary | ICD-10-CM | POA: Diagnosis not present

## 2016-10-29 DIAGNOSIS — I10 Essential (primary) hypertension: Secondary | ICD-10-CM | POA: Diagnosis not present

## 2016-10-29 DIAGNOSIS — Z466 Encounter for fitting and adjustment of urinary device: Secondary | ICD-10-CM | POA: Diagnosis not present

## 2016-10-29 DIAGNOSIS — I69322 Dysarthria following cerebral infarction: Secondary | ICD-10-CM | POA: Diagnosis not present

## 2016-10-29 DIAGNOSIS — I69354 Hemiplegia and hemiparesis following cerebral infarction affecting left non-dominant side: Secondary | ICD-10-CM | POA: Diagnosis not present

## 2016-10-29 DIAGNOSIS — M48062 Spinal stenosis, lumbar region with neurogenic claudication: Secondary | ICD-10-CM | POA: Diagnosis not present

## 2016-10-30 DIAGNOSIS — I10 Essential (primary) hypertension: Secondary | ICD-10-CM | POA: Diagnosis not present

## 2016-10-30 DIAGNOSIS — I69322 Dysarthria following cerebral infarction: Secondary | ICD-10-CM | POA: Diagnosis not present

## 2016-10-30 DIAGNOSIS — Z466 Encounter for fitting and adjustment of urinary device: Secondary | ICD-10-CM | POA: Diagnosis not present

## 2016-10-30 DIAGNOSIS — M48062 Spinal stenosis, lumbar region with neurogenic claudication: Secondary | ICD-10-CM | POA: Diagnosis not present

## 2016-10-30 DIAGNOSIS — I69354 Hemiplegia and hemiparesis following cerebral infarction affecting left non-dominant side: Secondary | ICD-10-CM | POA: Diagnosis not present

## 2016-10-30 DIAGNOSIS — I4891 Unspecified atrial fibrillation: Secondary | ICD-10-CM | POA: Diagnosis not present

## 2016-10-31 DIAGNOSIS — I4891 Unspecified atrial fibrillation: Secondary | ICD-10-CM | POA: Diagnosis not present

## 2016-10-31 DIAGNOSIS — M48062 Spinal stenosis, lumbar region with neurogenic claudication: Secondary | ICD-10-CM | POA: Diagnosis not present

## 2016-10-31 DIAGNOSIS — I69322 Dysarthria following cerebral infarction: Secondary | ICD-10-CM | POA: Diagnosis not present

## 2016-10-31 DIAGNOSIS — I69354 Hemiplegia and hemiparesis following cerebral infarction affecting left non-dominant side: Secondary | ICD-10-CM | POA: Diagnosis not present

## 2016-10-31 DIAGNOSIS — Z466 Encounter for fitting and adjustment of urinary device: Secondary | ICD-10-CM | POA: Diagnosis not present

## 2016-10-31 DIAGNOSIS — I10 Essential (primary) hypertension: Secondary | ICD-10-CM | POA: Diagnosis not present

## 2016-11-04 DIAGNOSIS — I10 Essential (primary) hypertension: Secondary | ICD-10-CM | POA: Diagnosis not present

## 2016-11-04 DIAGNOSIS — Z466 Encounter for fitting and adjustment of urinary device: Secondary | ICD-10-CM | POA: Diagnosis not present

## 2016-11-04 DIAGNOSIS — I69322 Dysarthria following cerebral infarction: Secondary | ICD-10-CM | POA: Diagnosis not present

## 2016-11-04 DIAGNOSIS — I4891 Unspecified atrial fibrillation: Secondary | ICD-10-CM | POA: Diagnosis not present

## 2016-11-04 DIAGNOSIS — I69354 Hemiplegia and hemiparesis following cerebral infarction affecting left non-dominant side: Secondary | ICD-10-CM | POA: Diagnosis not present

## 2016-11-04 DIAGNOSIS — M48062 Spinal stenosis, lumbar region with neurogenic claudication: Secondary | ICD-10-CM | POA: Diagnosis not present

## 2016-11-05 DIAGNOSIS — I69322 Dysarthria following cerebral infarction: Secondary | ICD-10-CM | POA: Diagnosis not present

## 2016-11-05 DIAGNOSIS — I4891 Unspecified atrial fibrillation: Secondary | ICD-10-CM | POA: Diagnosis not present

## 2016-11-05 DIAGNOSIS — I69354 Hemiplegia and hemiparesis following cerebral infarction affecting left non-dominant side: Secondary | ICD-10-CM | POA: Diagnosis not present

## 2016-11-05 DIAGNOSIS — Z466 Encounter for fitting and adjustment of urinary device: Secondary | ICD-10-CM | POA: Diagnosis not present

## 2016-11-05 DIAGNOSIS — M48062 Spinal stenosis, lumbar region with neurogenic claudication: Secondary | ICD-10-CM | POA: Diagnosis not present

## 2016-11-05 DIAGNOSIS — I10 Essential (primary) hypertension: Secondary | ICD-10-CM | POA: Diagnosis not present

## 2016-11-07 DIAGNOSIS — I69322 Dysarthria following cerebral infarction: Secondary | ICD-10-CM | POA: Diagnosis not present

## 2016-11-07 DIAGNOSIS — M48062 Spinal stenosis, lumbar region with neurogenic claudication: Secondary | ICD-10-CM | POA: Diagnosis not present

## 2016-11-07 DIAGNOSIS — Z466 Encounter for fitting and adjustment of urinary device: Secondary | ICD-10-CM | POA: Diagnosis not present

## 2016-11-07 DIAGNOSIS — I69354 Hemiplegia and hemiparesis following cerebral infarction affecting left non-dominant side: Secondary | ICD-10-CM | POA: Diagnosis not present

## 2016-11-07 DIAGNOSIS — I4891 Unspecified atrial fibrillation: Secondary | ICD-10-CM | POA: Diagnosis not present

## 2016-11-07 DIAGNOSIS — I10 Essential (primary) hypertension: Secondary | ICD-10-CM | POA: Diagnosis not present

## 2016-11-08 DIAGNOSIS — Z466 Encounter for fitting and adjustment of urinary device: Secondary | ICD-10-CM | POA: Diagnosis not present

## 2016-11-08 DIAGNOSIS — I69322 Dysarthria following cerebral infarction: Secondary | ICD-10-CM | POA: Diagnosis not present

## 2016-11-08 DIAGNOSIS — I10 Essential (primary) hypertension: Secondary | ICD-10-CM | POA: Diagnosis not present

## 2016-11-08 DIAGNOSIS — I69354 Hemiplegia and hemiparesis following cerebral infarction affecting left non-dominant side: Secondary | ICD-10-CM | POA: Diagnosis not present

## 2016-11-08 DIAGNOSIS — I4891 Unspecified atrial fibrillation: Secondary | ICD-10-CM | POA: Diagnosis not present

## 2016-11-08 DIAGNOSIS — M48062 Spinal stenosis, lumbar region with neurogenic claudication: Secondary | ICD-10-CM | POA: Diagnosis not present

## 2016-11-11 ENCOUNTER — Ambulatory Visit (INDEPENDENT_AMBULATORY_CARE_PROVIDER_SITE_OTHER): Payer: Medicare Other | Admitting: *Deleted

## 2016-11-11 DIAGNOSIS — I69322 Dysarthria following cerebral infarction: Secondary | ICD-10-CM | POA: Diagnosis not present

## 2016-11-11 DIAGNOSIS — I69354 Hemiplegia and hemiparesis following cerebral infarction affecting left non-dominant side: Secondary | ICD-10-CM | POA: Diagnosis not present

## 2016-11-11 DIAGNOSIS — I639 Cerebral infarction, unspecified: Secondary | ICD-10-CM | POA: Diagnosis not present

## 2016-11-11 DIAGNOSIS — I4891 Unspecified atrial fibrillation: Secondary | ICD-10-CM | POA: Diagnosis not present

## 2016-11-11 DIAGNOSIS — M48062 Spinal stenosis, lumbar region with neurogenic claudication: Secondary | ICD-10-CM | POA: Diagnosis not present

## 2016-11-11 DIAGNOSIS — Z466 Encounter for fitting and adjustment of urinary device: Secondary | ICD-10-CM | POA: Diagnosis not present

## 2016-11-11 DIAGNOSIS — I10 Essential (primary) hypertension: Secondary | ICD-10-CM | POA: Diagnosis not present

## 2016-11-12 DIAGNOSIS — Z466 Encounter for fitting and adjustment of urinary device: Secondary | ICD-10-CM | POA: Diagnosis not present

## 2016-11-12 DIAGNOSIS — I69322 Dysarthria following cerebral infarction: Secondary | ICD-10-CM | POA: Diagnosis not present

## 2016-11-12 DIAGNOSIS — I10 Essential (primary) hypertension: Secondary | ICD-10-CM | POA: Diagnosis not present

## 2016-11-12 DIAGNOSIS — I69354 Hemiplegia and hemiparesis following cerebral infarction affecting left non-dominant side: Secondary | ICD-10-CM | POA: Diagnosis not present

## 2016-11-12 DIAGNOSIS — M48062 Spinal stenosis, lumbar region with neurogenic claudication: Secondary | ICD-10-CM | POA: Diagnosis not present

## 2016-11-12 DIAGNOSIS — I4891 Unspecified atrial fibrillation: Secondary | ICD-10-CM | POA: Diagnosis not present

## 2016-11-12 NOTE — Progress Notes (Signed)
Carelink Summary Report / Loop Recorder 

## 2016-11-13 DIAGNOSIS — M48062 Spinal stenosis, lumbar region with neurogenic claudication: Secondary | ICD-10-CM | POA: Diagnosis not present

## 2016-11-13 DIAGNOSIS — I69322 Dysarthria following cerebral infarction: Secondary | ICD-10-CM | POA: Diagnosis not present

## 2016-11-13 DIAGNOSIS — Z466 Encounter for fitting and adjustment of urinary device: Secondary | ICD-10-CM | POA: Diagnosis not present

## 2016-11-13 DIAGNOSIS — I4891 Unspecified atrial fibrillation: Secondary | ICD-10-CM | POA: Diagnosis not present

## 2016-11-13 DIAGNOSIS — I10 Essential (primary) hypertension: Secondary | ICD-10-CM | POA: Diagnosis not present

## 2016-11-13 DIAGNOSIS — I69354 Hemiplegia and hemiparesis following cerebral infarction affecting left non-dominant side: Secondary | ICD-10-CM | POA: Diagnosis not present

## 2016-11-14 DIAGNOSIS — I69322 Dysarthria following cerebral infarction: Secondary | ICD-10-CM | POA: Diagnosis not present

## 2016-11-14 DIAGNOSIS — I4891 Unspecified atrial fibrillation: Secondary | ICD-10-CM | POA: Diagnosis not present

## 2016-11-14 DIAGNOSIS — M48062 Spinal stenosis, lumbar region with neurogenic claudication: Secondary | ICD-10-CM | POA: Diagnosis not present

## 2016-11-14 DIAGNOSIS — Z466 Encounter for fitting and adjustment of urinary device: Secondary | ICD-10-CM | POA: Diagnosis not present

## 2016-11-14 DIAGNOSIS — I10 Essential (primary) hypertension: Secondary | ICD-10-CM | POA: Diagnosis not present

## 2016-11-14 DIAGNOSIS — I69354 Hemiplegia and hemiparesis following cerebral infarction affecting left non-dominant side: Secondary | ICD-10-CM | POA: Diagnosis not present

## 2016-11-19 DIAGNOSIS — I10 Essential (primary) hypertension: Secondary | ICD-10-CM | POA: Diagnosis not present

## 2016-11-19 DIAGNOSIS — M48062 Spinal stenosis, lumbar region with neurogenic claudication: Secondary | ICD-10-CM | POA: Diagnosis not present

## 2016-11-19 DIAGNOSIS — I69322 Dysarthria following cerebral infarction: Secondary | ICD-10-CM | POA: Diagnosis not present

## 2016-11-19 DIAGNOSIS — I69354 Hemiplegia and hemiparesis following cerebral infarction affecting left non-dominant side: Secondary | ICD-10-CM | POA: Diagnosis not present

## 2016-11-19 DIAGNOSIS — I4891 Unspecified atrial fibrillation: Secondary | ICD-10-CM | POA: Diagnosis not present

## 2016-11-19 DIAGNOSIS — Z466 Encounter for fitting and adjustment of urinary device: Secondary | ICD-10-CM | POA: Diagnosis not present

## 2016-11-20 DIAGNOSIS — I4891 Unspecified atrial fibrillation: Secondary | ICD-10-CM | POA: Diagnosis not present

## 2016-11-20 DIAGNOSIS — K921 Melena: Secondary | ICD-10-CM | POA: Diagnosis not present

## 2016-11-20 DIAGNOSIS — M792 Neuralgia and neuritis, unspecified: Secondary | ICD-10-CM | POA: Diagnosis not present

## 2016-11-20 DIAGNOSIS — G629 Polyneuropathy, unspecified: Secondary | ICD-10-CM | POA: Diagnosis not present

## 2016-11-20 DIAGNOSIS — F324 Major depressive disorder, single episode, in partial remission: Secondary | ICD-10-CM | POA: Diagnosis not present

## 2016-11-20 DIAGNOSIS — Z5181 Encounter for therapeutic drug level monitoring: Secondary | ICD-10-CM | POA: Diagnosis not present

## 2016-11-21 DIAGNOSIS — Z466 Encounter for fitting and adjustment of urinary device: Secondary | ICD-10-CM | POA: Diagnosis not present

## 2016-11-21 DIAGNOSIS — I4891 Unspecified atrial fibrillation: Secondary | ICD-10-CM | POA: Diagnosis not present

## 2016-11-21 DIAGNOSIS — M48062 Spinal stenosis, lumbar region with neurogenic claudication: Secondary | ICD-10-CM | POA: Diagnosis not present

## 2016-11-21 DIAGNOSIS — I10 Essential (primary) hypertension: Secondary | ICD-10-CM | POA: Diagnosis not present

## 2016-11-21 DIAGNOSIS — I69354 Hemiplegia and hemiparesis following cerebral infarction affecting left non-dominant side: Secondary | ICD-10-CM | POA: Diagnosis not present

## 2016-11-21 DIAGNOSIS — I69322 Dysarthria following cerebral infarction: Secondary | ICD-10-CM | POA: Diagnosis not present

## 2016-11-25 LAB — CUP PACEART REMOTE DEVICE CHECK
Date Time Interrogation Session: 20180506044327
MDC IDC PG IMPLANT DT: 20160714

## 2016-12-05 ENCOUNTER — Encounter: Payer: Self-pay | Admitting: Rehabilitation

## 2016-12-05 ENCOUNTER — Ambulatory Visit: Payer: Medicare Other | Attending: Internal Medicine | Admitting: Rehabilitation

## 2016-12-05 DIAGNOSIS — R208 Other disturbances of skin sensation: Secondary | ICD-10-CM | POA: Insufficient documentation

## 2016-12-05 DIAGNOSIS — R2681 Unsteadiness on feet: Secondary | ICD-10-CM | POA: Diagnosis not present

## 2016-12-05 DIAGNOSIS — M6281 Muscle weakness (generalized): Secondary | ICD-10-CM | POA: Diagnosis not present

## 2016-12-05 DIAGNOSIS — I69354 Hemiplegia and hemiparesis following cerebral infarction affecting left non-dominant side: Secondary | ICD-10-CM

## 2016-12-05 DIAGNOSIS — R2689 Other abnormalities of gait and mobility: Secondary | ICD-10-CM | POA: Diagnosis not present

## 2016-12-05 NOTE — Therapy (Signed)
[facility] [address] [place], [place], [address] Phone: [phone]   Fax:  [phone]  Physical Therapy Evaluation  Patient Details  Name: Angela Keith MRN: [id number] Date of Birth: Oct 14, 1929 Referring Provider: Marden Noble, MD  Encounter Date: 12/05/2016      PT End of Session - 12/05/16 1917    Visit Number 1   Number of Visits 13   Date for PT Re-Evaluation 02/03/17   Authorization Type MCR G Code on every 10th visit   PT Start Time 1404   PT Stop Time 1449   PT Time Calculation (min) 45 min   Activity Tolerance Patient tolerated treatment well   Behavior During Therapy [facility] for tasks assessed/performed      Past Medical History:  Diagnosis Date  . Arthritis   . Atrial fibrillation (HCC)   . Chronic neck pain   . DJD (degenerative joint disease), cervical   . DJD (degenerative joint disease), lumbar   . Dysphagia   . GERD (gastroesophageal reflux disease)   . Glaucoma   . HTN (hypertension) 10/30/2012  . Hyperlipidemia   . Hypothyroidism   . Insomnia   . Nocturia   . Right bundle branch block 03/21/2014  . Rotoscoliosis   . Severe hearing loss   . Shortness of breath dyspnea   . Stroke (HCC)   . Unspecified hypothyroidism 10/30/2012    Past Surgical History:  Procedure Laterality Date  . BACK SURGERY     lower back   . bladder laser surgery    . BREAST SURGERY    . cataract surgery    . EP IMPLANTABLE DEVICE N/A 01/19/2015   Procedure: Loop Recorder Insertion;  Surgeon: Marinus Maw, MD;  Location: [facility] INVASIVE CV LAB;  Service: Cardiovascular;  Laterality: N/A;  . ESOPHAGOGASTRODUODENOSCOPY (EGD) WITH ESOPHAGEAL DILATION  06/08/2012   Procedure: ESOPHAGOGASTRODUODENOSCOPY (EGD) WITH ESOPHAGEAL DILATION;  Surgeon: Charolett Bumpers, MD;  Location: [facility] ENDOSCOPY;  Service: Endoscopy;  Laterality: N/A;  . facail surgery  2000   facelift and eyelid lift  . left carpal tunnel surgery    .  LUMBAR LAMINECTOMY/DECOMPRESSION MICRODISCECTOMY N/A 01/17/2015   Procedure: Laminectomy and Foraminotomy - L2-L3 - L3-L4;  Surgeon: [name], MD;  Location: [facility] NEURO ORS;  Service: Neurosurgery;  Laterality: N/A;  Laminectomy and Foraminotomy - L2-L3 - L3-L4  . TEE WITHOUT CARDIOVERSION N/A 01/19/2015   Procedure: TRANSESOPHAGEAL ECHOCARDIOGRAM (TEE);  Surgeon: Chrystie Nose, MD;  Location: [facility] ENDOSCOPY;  Service: Cardiovascular;  Laterality: N/A;    There were no vitals filed for this visit.       Subjective Assessment - 12/05/16 1411    Subjective Pt presents s/p CVA x 4 with most recent being on 08/06/16. Note previous L spastic hemiplegia with new onset R weakness, but do note that L side seems to be getting some return per daughter in law.     Patient is accompained by: Family member  Dena (daughter in Social worker)    Pertinent History CVA x 4 in history, HOH    Limitations House hold activities;Walking   Patient Stated Goals "I want to walk with a walker."    Currently in Pain? No/denies  has L leg spasms at night            [facility] PT Assessment - 12/05/16 0001      Assessment   Medical Diagnosis CVA   Referring Provider Marden Noble, MD   Onset Date/Surgical Date --  most recent 08/06/16   Prior Therapy acute, SNF, HHPT/OT/SLP      Precautions   Precautions Fall  HOH   Required Braces or Orthoses --  has AFO and knee brace but doesn't seem to need     Balance Screen   Has the patient fallen in the past 6 months No   Has the patient had a decrease in activity level because of a fear of falling?  Yes   Is the patient reluctant to leave their home because of a fear of falling?  Yes     Home Environment   Living Environment Private residence   Living Arrangements Children   Available Help at Discharge Family;Available 24 hours/day  son works, daughter in Social worker there most of time   Type of Home House   Home Access Ramped entrance   Home Layout One level   Home Equipment  Wheelchair - manual;Walker - 2 wheels;Bedside commode;Tub bench;Hand held shower head;Grab bars - tub/shower  sponge bathes most frequently      Prior Function   Level of Independence Needs assistance with ADLs  been at a w/c level since 2016   Vocation Retired   Leisure Going out to eat with family, working on crossword puzzles     Cognition   Overall Cognitive Status Impaired/Different from baseline   Area of Impairment Attention;Memory;Following commands   Current Attention Level Sustained   Attention Comments Pt highly distracted in only minimally busy environment   Memory Decreased short-term memory   Following Commands Follows one step commands with increased time;Follows multi-step commands inconsistently   Attention Sustained   Sustained Attention Impaired   Sustained Attention Impairment Functional basic     Sensation   Light Touch Impaired Detail   Light Touch Impaired Details Impaired LLE   Proprioception Impaired Detail   Proprioception Impaired Details Impaired [name];Impaired LLE   Additional Comments Pt reports no sensory changes in UE, however based on daughter in laws reports, seems to have some sensory deficits.      Coordination   Gross Motor Movements are Fluid and Coordinated No   Fine Motor Movements are Fluid and Coordinated No   Coordination and Movement Description decreased coordination in LLE   Finger Nose Finger Test --  had her flip hands up/down, decreased coordination in [name]   Heel Shin Test decreased fluidity in LLE     ROM / Strength   AROM / PROM / Strength Strength     Strength   Overall Strength Deficits   Overall Strength Comments L hip flex 2-/5, L knee ext 3/5, L knee flex 2-/5, L ankle DF 1/5, L ankle PF 1/5, R LE grossly 3+/5     Bed Mobility   Bed Mobility Sit to Supine;Supine to Sit   Supine to Sit 5: Supervision   Supine to Sit Details (indicate cue type and reason) cues for sequencing   Sit to Supine 5: Supervision      Transfers   Transfers Sit to Stand;Stand to Sit;Squat Pivot Transfers   Sit to Stand 4: Min guard;With upper extremity assist;From chair/3-in-1   Stand to Sit 4: Min guard;With upper extremity assist;To chair/3-in-1   Squat Pivot Transfers 4: Min assist;3: Mod assist;With upper extremity assistance   Squat Pivot Transfer Details (indicate cue type and reason) Pt able to complete transfer to the L with min A, however requires up to mod A when transferring to the R with cues for foot placement and faciliatation for forward weight shift.  Ambulation/Gait   Ambulation/Gait Yes   Ambulation/Gait Assistance 4: Min assist   Ambulation/Gait Assistance Details Had pt ambulate short distance with RW.  Note increased flexor spasticity in LLE with standing and decreased ability to bear weight through leg.  Heavy reliance of UE support.     Ambulation Distance (Feet) 10 Feet   Assistive device Rolling walker   Gait Pattern Step-to pattern;Decreased step length - right;Decreased stance time - left;Decreased stride length;Decreased dorsiflexion - left;Decreased weight shift to left;Left flexed knee in stance;Trunk flexed;Poor foot clearance - left   Ambulation Surface Level;Indoor            Objective measurements completed on examination: See above findings.                  PT Education - 12/05/16 1916    Education provided Yes   Education Details evaluation findings, POC, goals   Person(s) Educated Patient;Child(ren)   Methods Explanation   Comprehension Verbalized understanding          PT Short Term Goals - 12/05/16 1925      PT SHORT TERM GOAL #1   Title Pt/caregiver will be independent with initial HEP in order to indicate improved functional mobility and decreased fall risk.  (Target Date: 01/04/17)   Status New     PT SHORT TERM GOAL #2   Title Will assess 5TSS and improve time by 5 secs in order to indicate improved functional strength.       PT SHORT TERM  GOAL #3   Title Pt will perform squat pivot transfers to the R or L consistently at min A level in order to increase independence with functional transfers.    Status New     PT SHORT TERM GOAL #4   Title Pt will perform dynamic standing balance at counter top with single UE support x 3 mins at min/guard level in order to improve independence with ADLs.    Status New     PT SHORT TERM GOAL #5   Title Pt will ambulate x 25' w/ LRAD at min A level over indoor surfaces in order to increase functional mobility at home.    Status New           PT Long Term Goals - 12/05/16 1930      PT LONG TERM GOAL #1   Title Pt/caregiver will be independent with final HEP in order to indicate improved functional mobility and decreased fall risk.  (Target Date: 02/03/17)   Status New     PT LONG TERM GOAL #2   Title Pt will improve 5TSS by 10 secs from baseline in order to indicate improved functional strength.     Status New     PT LONG TERM GOAL #3   Title Pt will perform stand pivot transfers w/ LRAD at S level in order to indicate improved functional independence.     Status New     PT LONG TERM GOAL #4   Title Pt will perform dynamic standing balance with single UE support (reaching upwards and laterally with opposite UE) x 5 mins at S level in order to indicate increased independence with ADLs.    Status New     PT LONG TERM GOAL #5   Title Pt will ambulate 31' w/ LRAD over indoor surfaces at S level in order to indicate improved functional independence.     Status New  Plan - 13-Dec-2016 1918    Clinical Impression Statement Pt presents s/p L MCA CVA on 08/06/16 with D/C to SNF before getting HH therapies.  Note history of 3 previous CVAs, leaving pt with residual spastic L hemiplegia.  Also note history of HTN, hyperlipidemia, DDD.  She now presents with R sided weakness (L>R), decreased sensation on L side, decreased proprioception, decreased balance, and poor  cognition.     History and Personal Factors relevant to plan of care: HTN, hyperlipidemia, DDD, previous CVAs, depenence on son and daughter in law-inability to live independently    Clinical Presentation Evolving   Clinical Presentation due to: L hemiplegia, generalized weakness, poor endurance, decreased balance, decreased sensation, decreased proprioception and poor cognition.    Clinical Decision Making Moderate   Rehab Potential Good   Clinical Impairments Affecting Rehab Potential previous history, cognitive deficits-poor carryover   PT Frequency 2x / week  then 1x/wk for 4 more weeks   PT Duration 4 weeks  then 1x/wk for 4 more weeks   PT Treatment/Interventions ADLs/Self Care Home Management;Electrical Stimulation;DME Instruction;Gait training;Stair training;Functional mobility training;Therapeutic activities;Therapeutic exercise;Balance training;Neuromuscular re-education;Cognitive remediation;Patient/family education;Orthotic Fit/Training;Passive range of motion   PT Next Visit Plan 5TSS, give HEP for automatic forced use of LLE, stretching for L hamstring, LLE NMR, gait with RW, repetition and education to daughter in law   Consulted and Agree with Plan of Care Patient;Family member/caregiver   Family Member Consulted daughter in law Dena      Patient will benefit from skilled therapeutic intervention in order to improve the following deficits and impairments:  Abnormal gait, Decreased activity tolerance, Decreased balance, Decreased cognition, Decreased coordination, Decreased endurance, Decreased knowledge of use of DME, Decreased mobility, Decreased range of motion, Decreased strength, Increased muscle spasms, Impaired perceived functional ability, Impaired flexibility, Impaired sensation, Impaired tone, Postural dysfunction  Visit Diagnosis: Spastic hemiplegia of left nondominant side as late effect of cerebral infarction (HCC) - Plan: PT plan of care cert/re-cert  Unsteadiness  on feet - Plan: PT plan of care cert/re-cert  Muscle weakness (generalized) - Plan: PT plan of care cert/re-cert  Other disturbances of skin sensation - Plan: PT plan of care cert/re-cert  Other abnormalities of gait and mobility - Plan: PT plan of care cert/re-cert      G-Codes - 2016/12/13 1934    Functional Assessment Tool Used (Outpatient Only) S for bed mobility, min/mod A for squat pivot transfers, min A for gait with RW up to 10'   Functional Limitation Mobility: Walking and moving around   Mobility: Walking and Moving Around Current Status [phone]) At least 40 percent but less than 60 percent impaired, limited or restricted   Mobility: Walking and Moving Around Goal Status [phone]) At least 20 percent but less than 40 percent impaired, limited or restricted       Problem List Patient Active Problem List   Diagnosis Date Noted  . Somnolence, daytime   . Acute ischemic left MCA stroke (HCC) 08/06/2016  . Acute urinary retention 08/06/2016  . Aphasia   . Depression 02/03/2016  . Insomnia 02/03/2016  . Chronic musculoskeletal pain 02/03/2016  . GERD (gastroesophageal reflux disease) 12/31/2015  . Pressure ulcer 12/23/2015  . Atrial fibrillation (HCC) 06/06/2015  . Embolic stroke (HCC) 03/22/2015  . Left hemiparesis (HCC)   . CVA (cerebral vascular accident) (HCC)   . H/O TIA (transient ischemic attack) and stroke   . Spinal stenosis, lumbar region, with neurogenic claudication 01/17/2015  . Lumbar stenosis 01/17/2015  .  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 Essential hypertension 11/10/2014  . HLD (hyperlipidemia) 11/10/2014  . Chronic back pain 11/08/2014  . Cerebral infarction due to thrombosis of cerebral artery (HCC)   . Hyperlipidemia LDL goal <70 09/07/2014  . Presumed CVA (cerebral infarction) s/p IV tPA, MRI negative 09/05/2014  . Hypertensive urgency 09/05/2014  . Right bundle branch block 03/21/2014  . Cough 10/31/2012  . Elevated lipase  10/30/2012  . HTN (hypertension) 10/30/2012  . Hypothyroidism 10/30/2012    Harriet ButteEmily Eldine Rencher, PT, MPT [facility] [address] [place], [place], [address] Phone: [phone]   Fax:  [phone] 12/05/16, 7:36 PM  Name: Angela PavlovRachel E Keith MRN: [id number] Date of Birth: 1930-01-23

## 2016-12-09 ENCOUNTER — Ambulatory Visit: Payer: Medicare Other | Attending: Internal Medicine | Admitting: Rehabilitative and Restorative Service Providers"

## 2016-12-09 DIAGNOSIS — I69354 Hemiplegia and hemiparesis following cerebral infarction affecting left non-dominant side: Secondary | ICD-10-CM | POA: Diagnosis not present

## 2016-12-09 DIAGNOSIS — R2689 Other abnormalities of gait and mobility: Secondary | ICD-10-CM | POA: Diagnosis not present

## 2016-12-09 DIAGNOSIS — R2681 Unsteadiness on feet: Secondary | ICD-10-CM | POA: Diagnosis not present

## 2016-12-09 DIAGNOSIS — R208 Other disturbances of skin sensation: Secondary | ICD-10-CM | POA: Insufficient documentation

## 2016-12-09 DIAGNOSIS — M6281 Muscle weakness (generalized): Secondary | ICD-10-CM | POA: Insufficient documentation

## 2016-12-09 NOTE — Therapy (Signed)
[facility] [address] [place], [place], [address] Phone: [phone]   Fax:  [phone]  Physical Therapy Treatment  Patient Details  Name: Angela PavlovRachel E Keith MRN: [id number] Date of Birth: 1930-02-02 Referring Provider: Marden Nobleobert Gates, MD  Encounter Date: 12/09/2016      PT End of Session - 12/09/16 1316    Visit Number 2   Number of Visits 13   Date for PT Re-Evaluation 02/03/17   Authorization Type MCR G Code on every 10th visit   PT Start Time 1235   PT Stop Time 1315   PT Time Calculation (min) 40 min   Activity Tolerance Patient tolerated treatment well   Behavior During Therapy [facility] for tasks assessed/performed      Past Medical History:  Diagnosis Date  . Arthritis   . Atrial fibrillation (HCC)   . Chronic neck pain   . DJD (degenerative joint disease), cervical   . DJD (degenerative joint disease), lumbar   . Dysphagia   . GERD (gastroesophageal reflux disease)   . Glaucoma   . HTN (hypertension) 10/30/2012  . Hyperlipidemia   . Hypothyroidism   . Insomnia   . Nocturia   . Right bundle branch block 03/21/2014  . Rotoscoliosis   . Severe hearing loss   . Shortness of breath dyspnea   . Stroke (HCC)   . Unspecified hypothyroidism 10/30/2012    Past Surgical History:  Procedure Laterality Date  . BACK SURGERY     lower back   . bladder laser surgery    . BREAST SURGERY    . cataract surgery    . EP IMPLANTABLE DEVICE N/A 01/19/2015   Procedure: Loop Recorder Insertion;  Surgeon: Marinus MawGregg W Taylor, MD;  [facility];  Service: Cardiovascular;  Laterality: N/A;  . ESOPHAGOGASTRODUODENOSCOPY (EGD) WITH ESOPHAGEAL DILATION  06/08/2012   Procedure: ESOPHAGOGASTRODUODENOSCOPY (EGD) WITH ESOPHAGEAL DILATION;  Surgeon: Charolett BumpersMartin K Johnson, MD;  Location: [facility] ENDOSCOPY;  Service: Endoscopy;  Laterality: N/A;  . facail surgery  2000   facelift and eyelid lift  . left carpal tunnel surgery    .  LUMBAR LAMINECTOMY/DECOMPRESSION MICRODISCECTOMY N/A 01/17/2015   Procedure: Laminectomy and Foraminotomy - L2-L3 - L3-L4;  Surgeon: Julio SicksHenry Pool, MD;  Location: [facility] NEURO ORS;  Service: Neurosurgery;  Laterality: N/A;  Laminectomy and Foraminotomy - L2-L3 - L3-L4  . TEE WITHOUT CARDIOVERSION N/A 01/19/2015   Procedure: TRANSESOPHAGEAL ECHOCARDIOGRAM (TEE);  Surgeon: Chrystie NoseKenneth C Hilty, MD;  [facility];  Service: Cardiovascular;  Laterality: N/A;    There were no vitals filed for this visit.      Subjective Assessment - 12/09/16 1245    Subjective The patient's daughter in law notes patient with some discomfort in leg after evaluation--improved within 2 days.   Patient is accompained by: Family member  daughter in law   Pertinent History CVA x 4 in history, HOH    Limitations House hold activities;Walking   Patient Stated Goals "I want to walk with a walker."    Currently in Pain? No/denies                         [facility] Adult PT Treatment/Exercise - 12/09/16 1512      Bed Mobility   Bed Mobility Supine to Sit;Sit to Supine   Supine to Sit 5: Supervision   Sit to Supine 5: Supervision     Transfers   Transfers Sit to Stand;Stand to Sit;Squat Pivot  Transfers   Sit to Stand 4: Min assist;4: Min guard   Sit to Stand Details Tactile cues for weight shifting;Verbal cues for technique;Tactile cues for weight beaing;Tactile cues for placement   Sit to Stand Details (indicate cue type and reason) Patient avoids anterior weight shift to initiate sit>stand transfer, PT worked to facilitate t/o our session   Stand to Sit 4: Min guard;With upper extremity assist   Squat Pivot Transfers 4: Min assist     Ambulation/Gait   Ambulation/Gait Yes   Ambulation/Gait Assistance 4: Min assist   Ambulation/Gait Assistance Details PT facilitated L quad engagement from initial contact to mid swing phase of gait.     Ambulation Distance (Feet) 15 Feet  x 3 repetitions   Assistive  device Rolling walker   Ambulation Surface Level     Neuro Re-ed    Neuro Re-ed Details  Sitting L weight shifting activities working on trunk elongation on the left side; standing with tactile cues for L quad engagement in standing with RW with min A, Sit>stand with right leg on compliant surface and tactile cues for weight shifting to the left..  Patient needs min A and pushes against facilitation to the left--does better with cues "bring your hip to my hand"     Exercises   Exercises Other Exercises   Other Exercises  Seated hamstring stretching with min A; supine caregiver st retching for hamstrings and heel cord.  Caregiver return demonstrates.                PT Education - 12/09/16 1315    Education provided Yes   Education Details HEP: hamstring stretch, heel cord stretch with caregiver assist   Person(s) Educated Patient;Child(ren)   Methods Explanation;Demonstration;Handout   Comprehension Verbalized understanding;Returned demonstration          PT Short Term Goals - 12/05/16 1925      PT SHORT TERM GOAL #1   Title Pt/caregiver will be independent with initial HEP in order to indicate improved functional mobility and decreased fall risk.  (Target Date: 01/04/17)   Status New     PT SHORT TERM GOAL #2   Title Will assess 5TSS and improve time by 5 secs in order to indicate improved functional strength.       PT SHORT TERM GOAL #3   Title Pt will perform squat pivot transfers to the R or L consistently at min A level in order to increase independence with functional transfers.    Status New     PT SHORT TERM GOAL #4   Title Pt will perform dynamic standing balance at counter top with single UE support x 3 mins at min/guard level in order to improve independence with ADLs.    Status New     PT SHORT TERM GOAL #5   Title Pt will ambulate x 25' w/ LRAD at min A level over indoor surfaces in order to increase functional mobility at home.    Status New            PT Long Term Goals - 12/05/16 1930      PT LONG TERM GOAL #1   Title Pt/caregiver will be independent with final HEP in order to indicate improved functional mobility and decreased fall risk.  (Target Date: 02/03/17)   Status New     PT LONG TERM GOAL #2   Title Pt will improve 5TSS by 10 secs from baseline in order to indicate improved functional strength.  Status New     PT LONG TERM GOAL #3   Title Pt will perform stand pivot transfers w/ LRAD at S level in order to indicate improved functional independence.     Status New     PT LONG TERM GOAL #4   Title Pt will perform dynamic standing balance with single UE support (reaching upwards and laterally with opposite UE) x 5 mins at S level in order to indicate increased independence with ADLs.    Status New     PT LONG TERM GOAL #5   Title Pt will ambulate 94' w/ LRAD over indoor surfaces at S level in order to indicate improved functional independence.     Status New               Plan - 12/09/16 1509    Clinical Impression Statement The patient has dec'd L LE weight shifting with tightness noted in HS and heel cord, some "pusher" tendencies noted if facilitated to the left (patient does better with tactile cues on left to shift toward).  Continue working on gait, NMR for weight shift and carryover of activities that are appropriate to home environment.   Clinical Impairments Affecting Rehab Potential previous history, cognitive deficits-poor carryover   PT Treatment/Interventions ADLs/Self Care Home Management;Electrical Stimulation;DME Instruction;Gait training;Stair training;Functional mobility training;Therapeutic activities;Therapeutic exercise;Balance training;Neuromuscular re-education;Cognitive remediation;Patient/family education;Orthotic Fit/Training;Passive range of motion   PT Next Visit Plan develop HEP further for L weight shift, NMR for L LE extension in standing, gait activities, repetition of movement  (*functional training for weight shifting reaching into cabinets, etc)   Consulted and Agree with Plan of Care Patient;Family member/caregiver   Family Member Consulted daughter in law Angela Keith      Patient will benefit from skilled therapeutic intervention in order to improve the following deficits and impairments:  Abnormal gait, Decreased activity tolerance, Decreased balance, Decreased cognition, Decreased coordination, Decreased endurance, Decreased knowledge of use of DME, Decreased mobility, Decreased range of motion, Decreased strength, Increased muscle spasms, Impaired perceived functional ability, Impaired flexibility, Impaired sensation, Impaired tone, Postural dysfunction  Visit Diagnosis: Spastic hemiplegia of left nondominant side as late effect of cerebral infarction (HCC)  Unsteadiness on feet  Muscle weakness (generalized)  Other abnormalities of gait and mobility     Problem List Patient Active Problem List   Diagnosis Date Noted  . Somnolence, daytime   . Acute ischemic left MCA stroke (HCC) 08/06/2016  . Acute urinary retention 08/06/2016  . Aphasia   . Depression 02/03/2016  . Insomnia 02/03/2016  . Chronic musculoskeletal pain 02/03/2016  . GERD (gastroesophageal reflux disease) 12/31/2015  . Pressure ulcer 12/23/2015  . Atrial fibrillation (HCC) 06/06/2015  . Embolic stroke (HCC) 03/22/2015  . Left hemiparesis (HCC)   . CVA (cerebral vascular accident) (HCC)   . H/O TIA (transient ischemic attack) and stroke   . Spinal stenosis, lumbar region, with neurogenic claudication 01/17/2015  . Lumbar stenosis 01/17/2015  . 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 Essential hypertension 11/10/2014  . HLD (hyperlipidemia) 11/10/2014  . Chronic back pain 11/08/2014  . Cerebral infarction due to thrombosis of cerebral artery (HCC)   . Hyperlipidemia LDL goal <70 09/07/2014  . Presumed CVA (cerebral infarction) s/p IV tPA, MRI negative 09/05/2014  .  Hypertensive urgency 09/05/2014  . Right bundle branch block 03/21/2014  . Cough 10/31/2012  . Elevated lipase 10/30/2012  . HTN (hypertension) 10/30/2012  . Hypothyroidism 10/30/2012    Manpreet Strey, PT 12/09/2016, 3:17 PM  [place]  [facility] [address] [place], [place], [address] Phone: [phone]   Fax:  [phone]  Name: Angela Keith MRN: [id number] Date of Birth: 08-15-1929

## 2016-12-09 NOTE — Patient Instructions (Signed)
CAREGIVER ASSISTED: Hamstrings - Supine    Caregiver holds leg at ankle and over knee; raises leg straight. Hold __30_ seconds. _2__ reps per set.  1x/day.   Copyright  VHI. All rights reserved.   CAREGIVER ASSISTED: Ankle Dorsiflexion    Caregiver cups hand under heel, rests foot on forearm; leans forward to move foot toward head. Hold _30__ seconds. __2_ reps per set.  1x/day.   Copyright  VHI. All rights reserved.

## 2016-12-10 ENCOUNTER — Ambulatory Visit (INDEPENDENT_AMBULATORY_CARE_PROVIDER_SITE_OTHER): Payer: Medicare Other | Admitting: *Deleted

## 2016-12-10 DIAGNOSIS — I639 Cerebral infarction, unspecified: Secondary | ICD-10-CM

## 2016-12-10 NOTE — Progress Notes (Signed)
Carelink Summary Report / Loop Recorder 

## 2016-12-11 ENCOUNTER — Ambulatory Visit: Payer: Medicare Other | Admitting: Rehabilitative and Restorative Service Providers"

## 2016-12-11 DIAGNOSIS — R2689 Other abnormalities of gait and mobility: Secondary | ICD-10-CM

## 2016-12-11 DIAGNOSIS — R208 Other disturbances of skin sensation: Secondary | ICD-10-CM | POA: Diagnosis not present

## 2016-12-11 DIAGNOSIS — R2681 Unsteadiness on feet: Secondary | ICD-10-CM

## 2016-12-11 DIAGNOSIS — M6281 Muscle weakness (generalized): Secondary | ICD-10-CM

## 2016-12-11 DIAGNOSIS — I69354 Hemiplegia and hemiparesis following cerebral infarction affecting left non-dominant side: Secondary | ICD-10-CM | POA: Diagnosis not present

## 2016-12-11 NOTE — Therapy (Signed)
[facility] [address] [place], [place], [address] Phone: [phone]   Fax:  [phone]  Physical Therapy Treatment  Patient Details  Name: Angela Keith MRN: [id number] Date of Birth: 01/28/1930 Referring Provider: Marden Noble, MD  Encounter Date: 12/11/2016      PT End of Session - 12/11/16 1433    Visit Number 3   Number of Visits 13   Date for PT Re-Evaluation 02/03/17   Authorization Type MCR G Code on every 10th visit   PT Start Time 1019   PT Stop Time 1100   PT Time Calculation (min) 41 min   Activity Tolerance Patient tolerated treatment well   Behavior During Therapy [facility] for tasks assessed/performed      Past Medical History:  Diagnosis Date  . Arthritis   . Atrial fibrillation (HCC)   . Chronic neck pain   . DJD (degenerative joint disease), cervical   . DJD (degenerative joint disease), lumbar   . Dysphagia   . GERD (gastroesophageal reflux disease)   . Glaucoma   . HTN (hypertension) 10/30/2012  . Hyperlipidemia   . Hypothyroidism   . Insomnia   . Nocturia   . Right bundle branch block 03/21/2014  . Rotoscoliosis   . Severe hearing loss   . Shortness of breath dyspnea   . Stroke (HCC)   . Unspecified hypothyroidism 10/30/2012    Past Surgical History:  Procedure Laterality Date  . BACK SURGERY     lower back   . bladder laser surgery    . BREAST SURGERY    . cataract surgery    . EP IMPLANTABLE DEVICE N/A 01/19/2015   Procedure: Loop Recorder Insertion;  Surgeon: Marinus Maw, MD;  [facility] INVASIVE CV LAB;  Service: Cardiovascular;  Laterality: N/A;  . ESOPHAGOGASTRODUODENOSCOPY (EGD) WITH ESOPHAGEAL DILATION  06/08/2012   Procedure: ESOPHAGOGASTRODUODENOSCOPY (EGD) WITH ESOPHAGEAL DILATION;  Surgeon: Charolett Bumpers, MD;  Location: [facility] ENDOS[facility];  Service: Endoscopy;  Laterality: N/A;  . facail surgery  2000   facelift and eyelid lift  . left carpal tunnel surgery    .  LUMBAR LAMINECTOMY/DECOMPRESSION MICRODISCECTOMY N/A 01/17/2015   Procedure: Laminectomy and Foraminotomy - L2-L3 - L3-L4;  Surgeon: [name], MD;  Location: [facility] NEURO ORS;  Service: Neurosurgery;  Laterality: N/A;  Laminectomy and Foraminotomy - L2-L3 - L3-L4  . TEE WITHOUT CARDIOVERSION N/A 01/19/2015   Procedure: TRANSESOPHAGEAL ECHOCARDIOGRAM (TEE);  Surgeon: Chrystie Nose, MD;  Location: [facility] ENDOSCOPY;  Service: Cardiovascular;  Laterality: N/A;    There were no vitals filed for this visit.      Subjective Assessment - 12/11/16 1025    Subjective The patient's daughter reports the patient has a call button to reach her when she goes to her house to stay for a couple of hours.  She also notes she supervised her transferring to/from bed and patient had some difficulty getting out of bed.  Also, bathroom set up at patient's home is different and that was challenging for patient--she needed assistance.   Patient is accompained by: Family member  daughter in law   Pertinent History CVA x 4 in history, HOH    Patient Stated Goals "I want to walk with a walker."    Currently in Pain? No/denies                         [facility] Adult PT Treatment/Exercise - 12/11/16 1438  Ambulation/Gait   Ambulation/Gait Yes   Ambulation/Gait Assistance Details PT began with RW with 2nd therapist present for tactile cues.  Walked 6 ft with RW with cues L LE for knee extension/hip extension prior to stance.  Pt needs verbal cues to slow movement, tactile cues to facilitate weight shift to the left and upright position, and then cues for longer step on right side.  +2 assist without device (using HHA on each side) x 12 feet.  Then +2 HHA with tactile/manual facilitation L LE x 20 feet *patient fatigued at end of walk and L knee needed assist to keep from buckling.  Walked with RW at end of session x 14 ft with min A + tactile facilitation L LE to encourage knee and hip extension and verbal cues  for longer stride length   Assistive device Rolling walker   Ambulation Surface Level     Neuro Re-ed    Neuro Re-ed Details  SITTING: pelvic lateral rocking to increase weight shift to the left ischial tuberosity with reaching tasks to the left.  PT facilitating "holding" reaching position for stretch and awareness.  STANDING:  L LE knee facilitation with hip extension and left weight shift activities with mirror for midline cues.  Patient had intermittent UE support through chair for safety and moved R LE into/out of stride position and wide to narrower base of support.      Exercises   Exercises Other Exercises   Other Exercises  Seated L trunk lengthening in sitting + R elbow prop working on L hip hike/depression with manual facilitation and passive stretching.                    PT Short Term Goals - 12/05/16 1925      PT SHORT TERM GOAL #1   Title Pt/caregiver will be independent with initial HEP in order to indicate improved functional mobility and decreased fall risk.  (Target Date: 01/04/17)   Status New     PT SHORT TERM GOAL #2   Title Will assess 5TSS and improve time by 5 secs in order to indicate improved functional strength.       PT SHORT TERM GOAL #3   Title Pt will perform squat pivot transfers to the R or L consistently at min A level in order to increase independence with functional transfers.    Status New     PT SHORT TERM GOAL #4   Title Pt will perform dynamic standing balance at counter top with single UE support x 3 mins at min/guard level in order to improve independence with ADLs.    Status New     PT SHORT TERM GOAL #5   Title Pt will ambulate x 25' w/ LRAD at min A level over indoor surfaces in order to increase functional mobility at home.    Status New           PT Long Term Goals - 12/05/16 1930      PT LONG TERM GOAL #1   Title Pt/caregiver will be independent with final HEP in order to indicate improved functional mobility and  decreased fall risk.  (Target Date: 02/03/17)   Status New     PT LONG TERM GOAL #2   Title Pt will improve 5TSS by 10 secs from baseline in order to indicate improved functional strength.     Status New     PT LONG TERM GOAL #3   Title Pt will perform stand  pivot transfers w/ LRAD at S level in order to indicate improved functional independence.     Status New     PT LONG TERM GOAL #4   Title Pt will perform dynamic standing balance with single UE support (reaching upwards and laterally with opposite UE) x 5 mins at S level in order to indicate increased independence with ADLs.    Status New     PT LONG TERM GOAL #5   Title Pt will ambulate 10' w/ LRAD over indoor surfaces at S level in order to indicate improved functional independence.     Status New               Plan - 12/11/16 1435    Clinical Impression Statement The patient needs cues to slow speed, tactile cues for L knee extension and hip extension for mid stance, and demo/verbal cues for longer R step length to improve gait mechanics.  PT emphasizing weight bearing activities through L side to determine if patient can carryover to gait activities.    PT Treatment/Interventions ADLs/Self Care Home Management;Electrical Stimulation;DME Instruction;Gait training;Stair training;Functional mobility training;Therapeutic activities;Therapeutic exercise;Balance training;Neuromuscular re-education;Cognitive remediation;Patient/family education;Orthotic Fit/Training;Passive range of motion   PT Next Visit Plan Assess 5x sit to stand per STG* (have not yet done); develop HEP further for L weight shift, NMR for L LE extension in standing, gait activities, repetition of movement (*functional training for weight shifting reaching into cabinets, etc)   Consulted and Agree with Plan of Care Patient;Family member/caregiver   Family Member Consulted daughter in law Dena      Patient will benefit from skilled therapeutic intervention in  order to improve the following deficits and impairments:  Abnormal gait, Decreased activity tolerance, Decreased balance, Decreased cognition, Decreased coordination, Decreased endurance, Decreased knowledge of use of DME, Decreased mobility, Decreased range of motion, Decreased strength, Increased muscle spasms, Impaired perceived functional ability, Impaired flexibility, Impaired sensation, Impaired tone, Postural dysfunction  Visit Diagnosis: Other abnormalities of gait and mobility  Unsteadiness on feet  Muscle weakness (generalized)  Spastic hemiplegia of left nondominant side as late effect of cerebral infarction [facility])     Problem List Patient Active Problem List   Diagnosis Date Noted  . Somnolence, daytime   . Acute ischemic left MCA stroke (HCC) 08/06/2016  . Acute urinary retention 08/06/2016  . Aphasia   . Depression 02/03/2016  . Insomnia 02/03/2016  . Chronic musculoskeletal pain 02/03/2016  . GERD (gastroesophageal reflux disease) 12/31/2015  . Pressure ulcer 12/23/2015  . Atrial fibrillation (HCC) 06/06/2015  . Embolic stroke (HCC) 03/22/2015  . Left hemiparesis (HCC)   . CVA (cerebral vascular accident) (HCC)   . H/O TIA (transient ischemic attack) and stroke   . Spinal stenosis, lumbar region, with neurogenic claudication 01/17/2015  . Lumbar stenosis 01/17/2015  . 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 Essential hypertension 11/10/2014  . HLD (hyperlipidemia) 11/10/2014  . Chronic back pain 11/08/2014  . Cerebral infarction due to thrombosis of cerebral artery (HCC)   . Hyperlipidemia LDL goal <70 09/07/2014  . Presumed CVA (cerebral infarction) s/p IV tPA, MRI negative 09/05/2014  . Hypertensive urgency 09/05/2014  . Right bundle branch block 03/21/2014  . Cough 10/31/2012  . Elevated lipase 10/30/2012  . HTN (hypertension) 10/30/2012  . Hypothyroidism 10/30/2012    Andie Mungin, PT 12/11/2016, 2:44 PM  [facility] [address] [place], [place], [address] Phone: [phone]   Fax:  [phone]  Name: Angela Keith [facility]  MRN: [id number] Date of Birth: 1929/11/20

## 2016-12-12 IMAGING — CR DG LUMBAR SPINE 1V
1 series · 1 of 1 positions shown · non-contrast
Comparison: MRI 12/21/2014 .

CLINICAL DATA: Lumbar spine surgery.

EXAM:
LUMBAR SPINE - 1 VIEW

[lat]
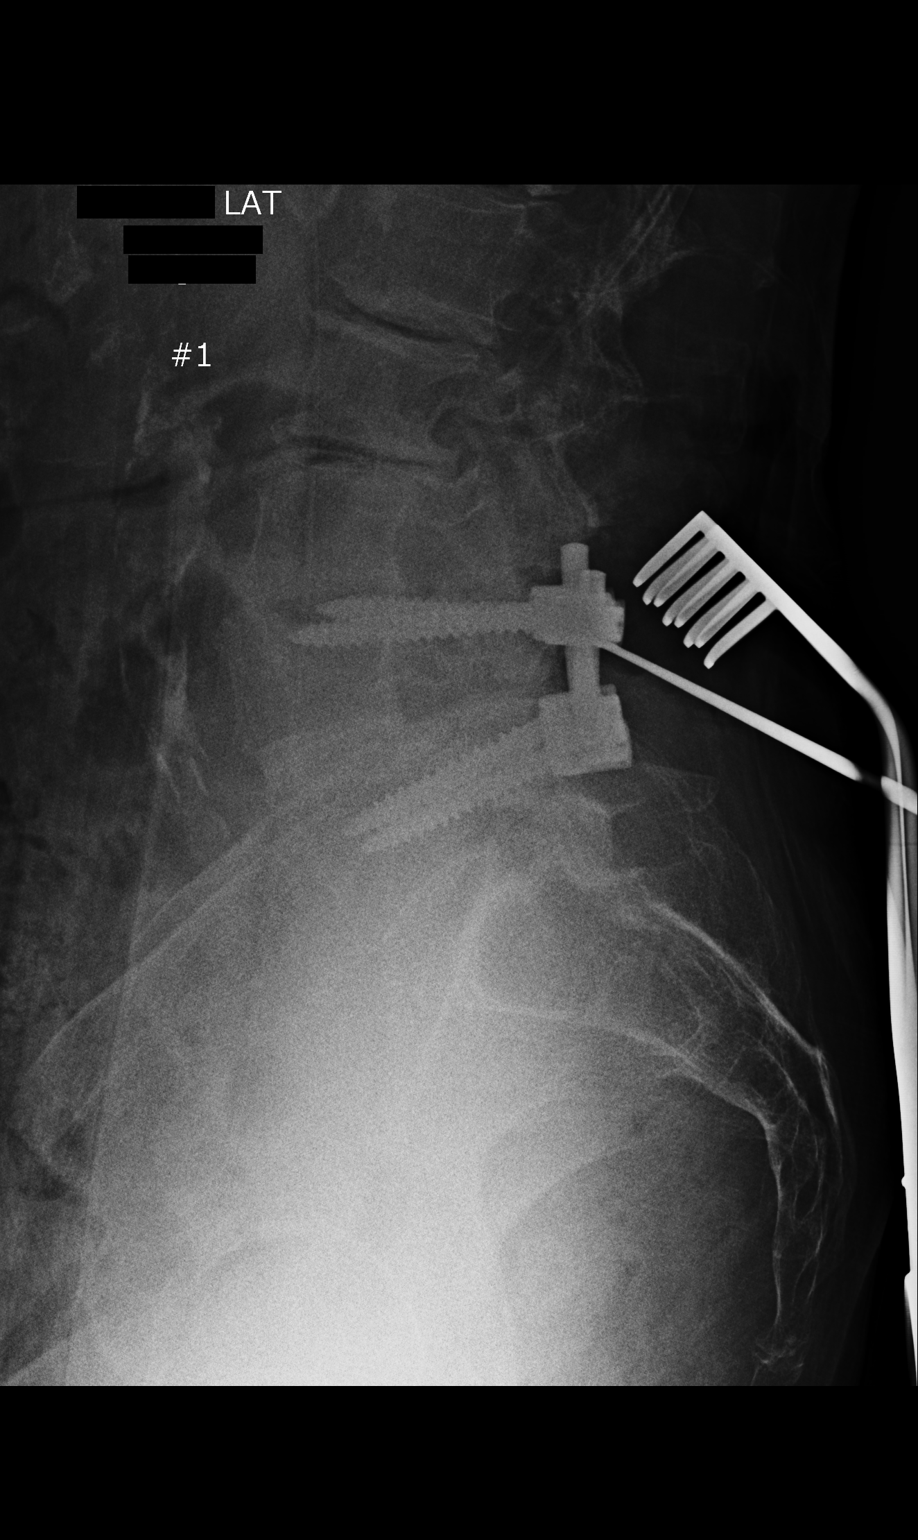

[1 of 1 positions shown; findings below may reference images not displayed]

FINDINGS: Posterior and interbody fusion L4-L5. Metallic marker is noted along
the posterior aspect of L4 . Lumbar vertebra numbered as per prior
MRI of 12/21/2014.
IMPRESSION: Metallic marker noted along the posterior aspect of L4.

## 2016-12-13 LAB — CUP PACEART REMOTE DEVICE CHECK
Date Time Interrogation Session: 20180605053949
Implantable Pulse Generator Implant Date: 20160714

## 2016-12-13 IMAGING — MR MR HEAD W/O CM
9 of 11 series · 30 of 48 positions shown · non-contrast
Comparison: Head CT without contrast 7154 hours today. Brain MRI
and intracranial MRA 09/06/2014.

ADDENDUM:
Study discussed by telephone with Dr. William Raul Agamez on 01/18/2015
at 2199 hours.
CLINICAL DATA: 84-year-old female status post recent lumbar surgery
with acute left side weakness and facial droop. Initial encounter.

EXAM:
MRI HEAD WITHOUT CONTRAST
MRA HEAD WITHOUT CONTRAST
TECHNIQUE: Multiplanar, multiecho pulse sequences of the brain and surrounding
structures were obtained without intravenous contrast. Angiographic
images of the head were obtained using MRA technique without
contrast.

[Series 3: DWI · axial · 3.0mm · 1.09mm/px · z∈[-44,+87]mm · 7 of 98 slices shown (1 of 4)]
[im 1/98]
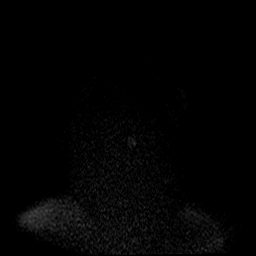
[im 17/98]
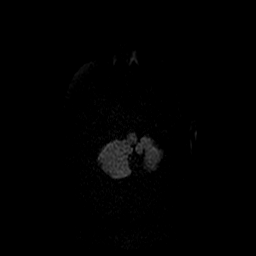
[im 33/98]
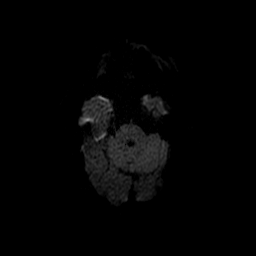
[im 49/98]
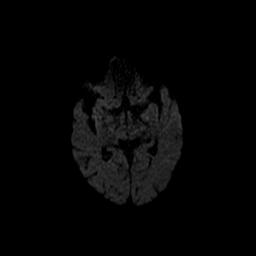
[im 65/98]
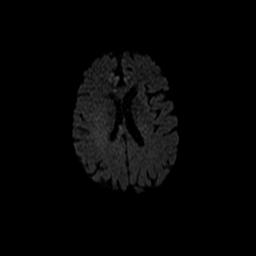
[im 81/98]
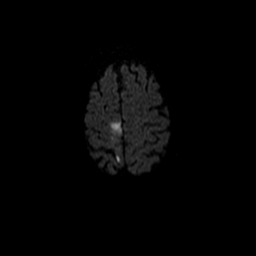
[im 98/98]
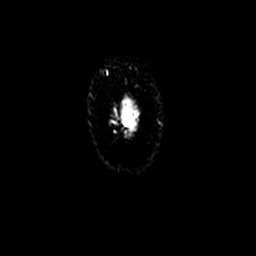

[Series 4: (id) mt fs · axial · 1.4mm · 0.43mm/px · z∈[-49,-24]mm · 3 of 136 slices shown]
[im 1/136]
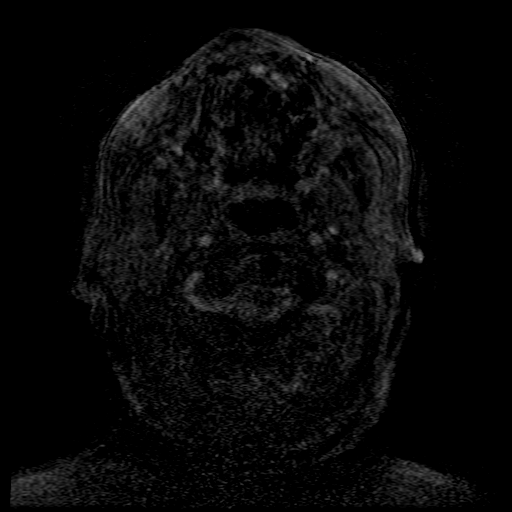
[im 28/136]
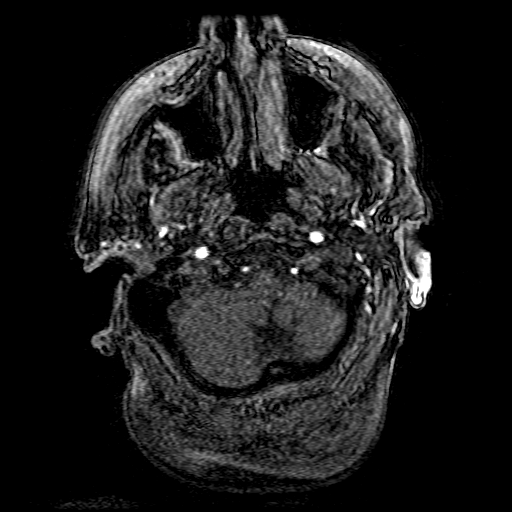
[im 41/136]
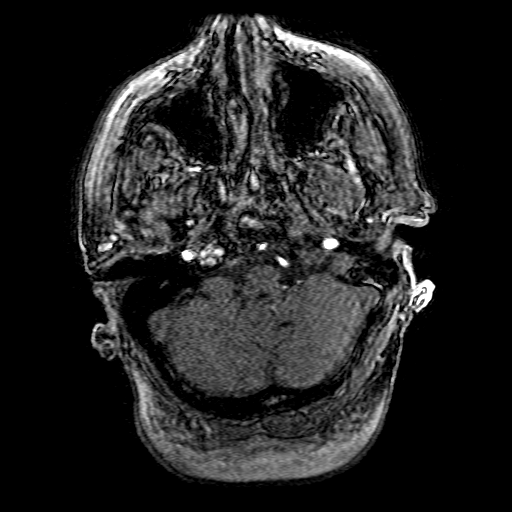

[Series 5: FLAIR · axial · 5.0mm · 0.43mm/px · z∈[-64,+69]mm · 2 of 25 slices shown]
[im 1/25]
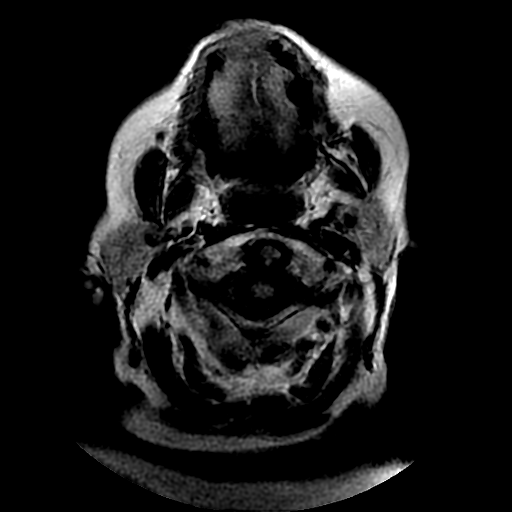
[im 25/25]
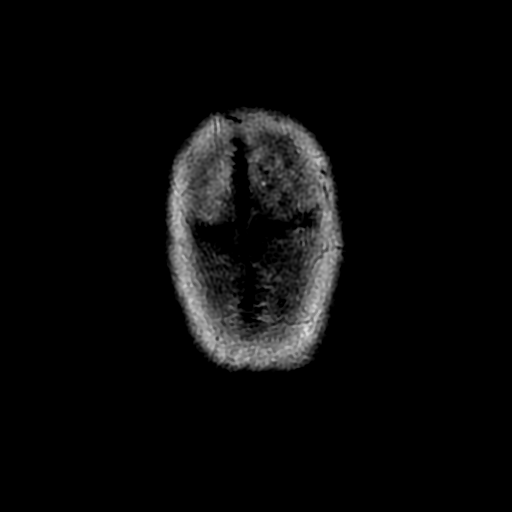

[Series 6: T2 · axial · 5.0mm · 0.43mm/px · z∈[-64,+69]mm · 2 of 25 slices shown (1 of 2)]
[im 1/25]
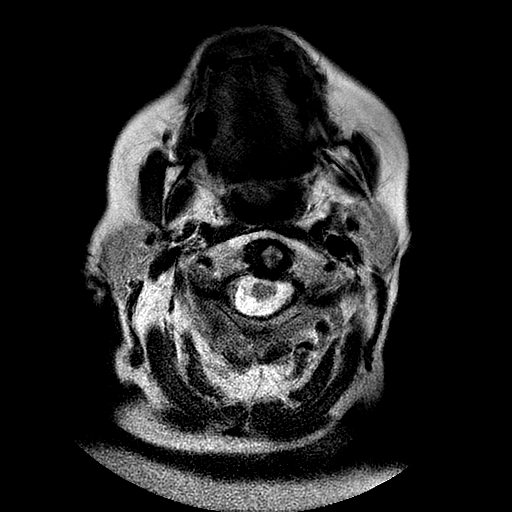
[im 25/25]
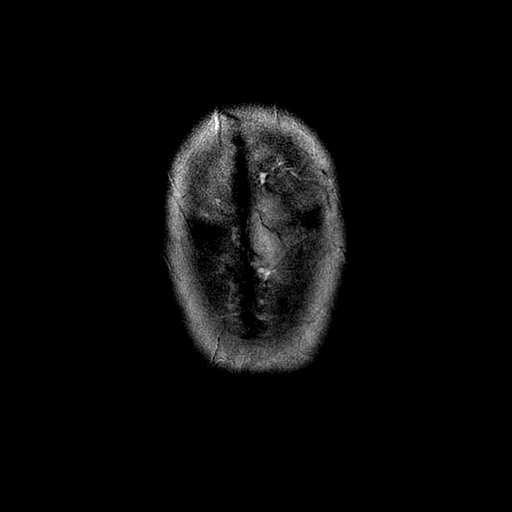

[Series 7: DWI · coronal · 5.0mm · 1.09mm/px · 5 of 66 slices shown (2 of 4)]
[im 1/66]
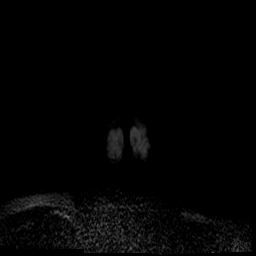
[im 17/66]
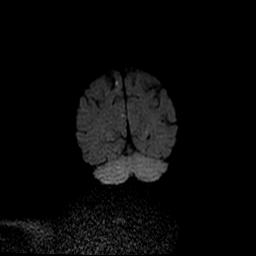
[im 33/66]
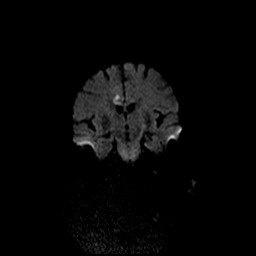
[im 49/66]
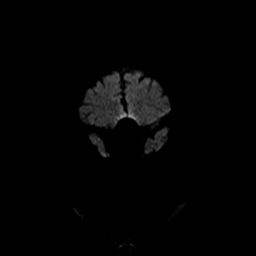
[im 66/66]
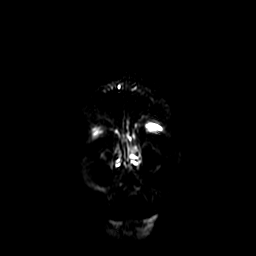

[Series 8: T1 · sagittal · 5.0mm · 0.47mm/px · 2 of 23 slices shown]
[im 1/23]
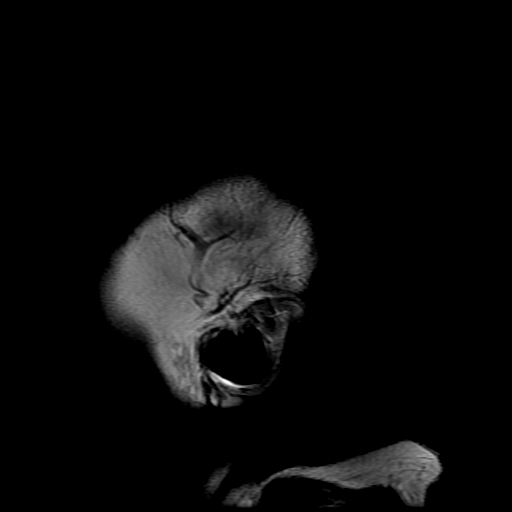
[im 23/23]
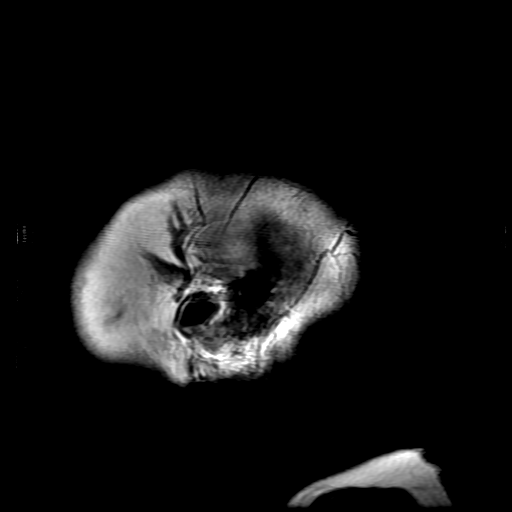

[Series 11: T2 · coronal · 5.0mm · 0.43mm/px · 2 of 31 slices shown (2 of 2)]
[im 1/31]
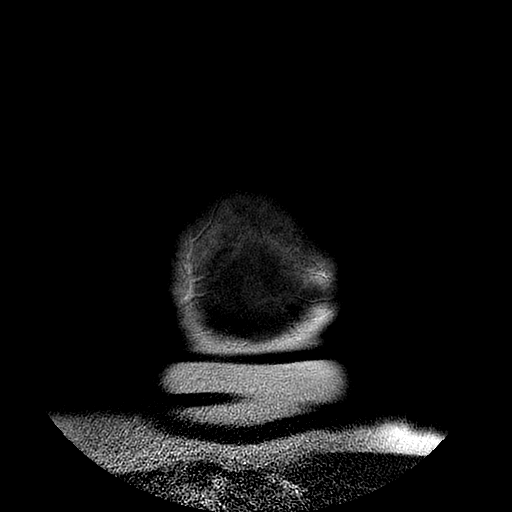
[im 31/31]
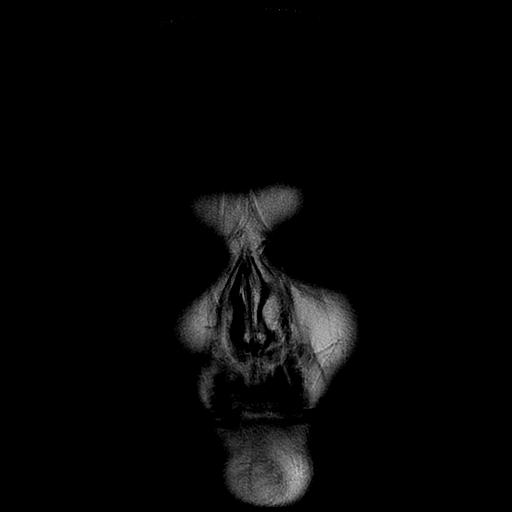

[Series 300: DWI · axial · 3.0mm · 1.09mm/px · z∈[-44,+87]mm · 4 of 49 slices shown (3 of 4)]
[im 1/49]
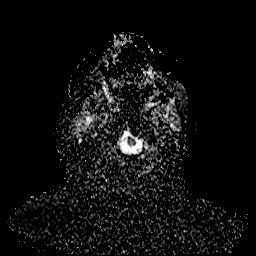
[im 17/49]
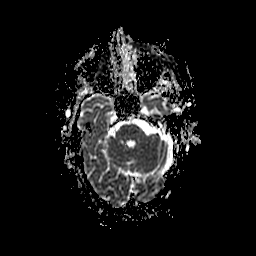
[im 33/49]
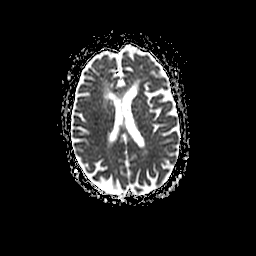
[im 49/49]
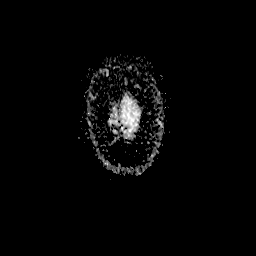

[Series 700: DWI · coronal · 5.0mm · 1.09mm/px · 3 of 33 slices shown (4 of 4)]
[im 1/33]
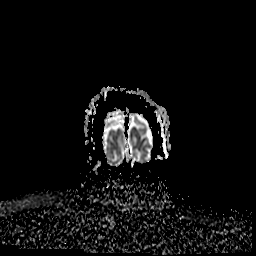
[im 17/33]
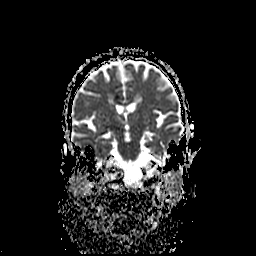
[im 33/33]
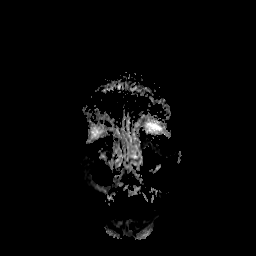

[30 of 48 positions shown; findings below may reference images not displayed]

FINDINGS: MRI HEAD FINDINGS

Confluent 2.5 cm area of restricted diffusion in the posterior right
ACA territory, with patchy involvement of the medial motor strip.
Patchy right cingulate gyrus involvement. Subtle associated T2 and
FLAIR hyperintensity. No definite associated hemorrhage. No mass
effect.

Superimposed punctate restricted diffusion along the medial left
thalamus (series 7, image 14). No other left hemisphere restricted
diffusion identified. No posterior fossa restricted diffusion
identified.

Major intracranial vascular flow voids are stable at the skullbase.
Loss of the distal right ACA flow void with associated increased
FLAIR signal, see MRA findings below.

Scattered round areas of T2* susceptibility appears related to the
subarachnoid space, and corresponds to several small foci of very
low density (negative 200-300 Hounsfield units) on the head CT ear
earlier today.

Stable gray and white matter signal elsewhere. No ventriculomegaly.
No midline shift. Basilar cisterns are patent. No extra-axial
collection. No intracranial mass lesion. Pituitary and
cervicomedullary junction appear stable. Normal bone marrow signal.

Internal auditory structures, paranasal sinuses, mastoids, orbits,
and scalp soft tissues are stable.

MRA HEAD FINDINGS

Stable antegrade flow in the distal vertebral arteries. No distal
vertebral or basilar artery stenosis. PICA origins remain patent.
Stable SCA and PCA origins. Stable bilateral PCA branches, with mild
to moderate irregularity and stenosis of the right P2 segment but
preserved distal flow. Posterior communicating arteries are
diminutive or absent.

Stable antegrade flow in both ICA siphons. Carotid termini remain
patent. MCA and ACA origins are stable and within normal limits.

Increased irregularity of the bilateral MCA M1 segments and right
ACA A1 segment, might in part be related to mild motion artifact
today. Evidence of moderate to severe stenosis now in the left M1
segment. However, both MCA bifurcations remain patent. No M2 branch
occlusion is identified, with bilateral M2 branch irregularity re-
identified.

The left ACA appears stable. The right ACA A1 segment is mildly
irregular, an the right A2 segment is occluded in the pericallosal
artery region. See series 4, images 118-123).
IMPRESSION: 1. Right ACA distal A2 segment occlusion with acute posterior right
ACA infarct, involving some of the medial motor strip. Mild
cytotoxic edema without hemorrhage or mass effect.
2. Increased left worse than right MCA M1 segment irregularity may
reflect new thromboembolic disease. No major MCA branch occlusion.
3. Punctate acute infarct in the medial left thalamus with no mass
effect or hemorrhage. The posterior circulation appears stable since
[DATE]. Scattered small areas of susceptibility appear related to small
very low density foci in the subarachnoid space on this CT earlier
today. Favor trace pneumocephalus, presumably arising from the
recent lumbar surgery.

## 2016-12-13 NOTE — Progress Notes (Signed)
[facility] summary report received. Battery status OK. Normal device function. No new symptom episodes or brady episodes. 1 tachy- ECG appears SVT w/ occ. PVC. 1 pause appears SB w/ PVC and undersensed vent. beat. 20 AF 0.3% +Eliquis. Monthly summary reports and ROV/PRN

## 2016-12-16 ENCOUNTER — Encounter: Payer: Self-pay | Admitting: Physical Therapy

## 2016-12-16 ENCOUNTER — Ambulatory Visit: Payer: Medicare Other | Admitting: Physical Therapy

## 2016-12-16 DIAGNOSIS — M6281 Muscle weakness (generalized): Secondary | ICD-10-CM

## 2016-12-16 DIAGNOSIS — I69354 Hemiplegia and hemiparesis following cerebral infarction affecting left non-dominant side: Secondary | ICD-10-CM | POA: Diagnosis not present

## 2016-12-16 DIAGNOSIS — R2689 Other abnormalities of gait and mobility: Secondary | ICD-10-CM | POA: Diagnosis not present

## 2016-12-16 DIAGNOSIS — R208 Other disturbances of skin sensation: Secondary | ICD-10-CM | POA: Diagnosis not present

## 2016-12-16 DIAGNOSIS — R2681 Unsteadiness on feet: Secondary | ICD-10-CM | POA: Diagnosis not present

## 2016-12-16 NOTE — Therapy (Signed)
[facility] [address] [place], [place], [address] Phone: [phone]   Fax:  [phone]  Physical Therapy Treatment  Patient Details  Name: Angela Keith MRN: [id number] Date of Birth: 10-25-1929 Referring Provider: Marden Noble, MD  Encounter Date: 12/16/2016      PT End of Session - 12/16/16 1657    Visit Number 4   Number of Visits 13   Date for PT Re-Evaluation 02/03/17   Authorization Type MCR G Code on every 10th visit   PT Start Time 1533   PT Stop Time 1615   PT Time Calculation (min) 42 min   Activity Tolerance Patient limited by fatigue   Behavior During Therapy [facility] for tasks assessed/performed      Past Medical History:  Diagnosis Date  . Arthritis   . Atrial fibrillation (HCC)   . Chronic neck pain   . DJD (degenerative joint disease), cervical   . DJD (degenerative joint disease), lumbar   . Dysphagia   . GERD (gastroesophageal reflux disease)   . Glaucoma   . HTN (hypertension) 10/30/2012  . Hyperlipidemia   . Hypothyroidism   . Insomnia   . Nocturia   . Right bundle branch block 03/21/2014  . Rotoscoliosis   . Severe hearing loss   . Shortness of breath dyspnea   . Stroke (HCC)   . Unspecified hypothyroidism 10/30/2012    Past Surgical History:  Procedure Laterality Date  . BACK SURGERY     lower back   . bladder laser surgery    . BREAST SURGERY    . cataract surgery    . EP IMPLANTABLE DEVICE N/A 01/19/2015   Procedure: Loop Recorder Insertion;  Surgeon: Marinus Maw, MD;  Location: [facility] INVASIVE CV LAB;  Service: Cardiovascular;  Laterality: N/A;  . ESOPHAGOGASTRODUODENOSCOPY (EGD) WITH ESOPHAGEAL DILATION  06/08/2012   Procedure: ESOPHAGOGASTRODUODENOSCOPY (EGD) WITH ESOPHAGEAL DILATION;  Surgeon: Charolett Bumpers, MD;  Location: [facility] ENDOSCOPY;  Service: Endoscopy;  Laterality: N/A;  . facail surgery  2000   facelift and eyelid lift  . left carpal tunnel surgery    . LUMBAR  LAMINECTOMY/DECOMPRESSION MICRODISCECTOMY N/A 01/17/2015   Procedure: Laminectomy and Foraminotomy - L2-L3 - L3-L4;  Surgeon: [name], MD;  Location: [facility] NEURO ORS;  Service: Neurosurgery;  Laterality: N/A;  Laminectomy and Foraminotomy - L2-L3 - L3-L4  . TEE WITHOUT CARDIOVERSION N/A 01/19/2015   Procedure: TRANSESOPHAGEAL ECHOCARDIOGRAM (TEE);  Surgeon: Chrystie Nose, MD;  Location: [facility] ENDOSCOPY;  Service: Cardiovascular;  Laterality: N/A;    There were no vitals filed for this visit.      Subjective Assessment - 12/16/16 1537    Subjective No issues to report from weekend; pt transfers from w/c > mat squat pivot but without pivoting feet-feet become crossed.  Has new hearing aid.   Patient is accompained by: Family member   Pertinent History CVA x 4 in history, HOH    Limitations House hold activities;Walking   Patient Stated Goals "I want to walk with a walker."    Currently in Pain? No/denies            [facility] PT Assessment - 12/16/16 0001      Standardized Balance Assessment   Standardized Balance Assessment Five Times Sit to Stand   Five times sit to stand comments  28.81 seconds with therapist on L side providing [name] support and stability at LLE to maintain foot contact on ground and weight shift to L; also required  control for stand > sit to prevent fall backwards while sitting.                     [facility] Adult PT Treatment/Exercise - 12/16/16 1639      Transfers   Transfers Sit to Stand;Stand to Sit;Squat Pivot Transfers   Sit to Stand 3: Mod assist;2: Max assist   Sit to Stand Details (indicate cue type and reason) Mod-max A (see NMR) for upright trunk and lateral excursion/weight shift to L for WB through LLE and activation of LLE during sit > stand   Stand to Sit 3: Mod assist   Stand to Sit Details Required assistance for controlled stand > sit due to uncontrolled descent and tendency to fall back and to R   Squat Pivot Transfers 3: Mod assist   Squat  Pivot Transfer Details (indicate cue type and reason) Performed squat pivot w/c > mat to R side with min A with pt demonstrating increased weight shift towards R side (side transferring to) and decreased activation on L side to elevate and pivot hips and feet.  Going back to w/c to L side pt required mod A to fully elevate hips up and to maintain squat during full pivot.       Neuro Re-ed    Neuro Re-ed Details  Sitting on balance disc: peformed anterior/posterior pelvic tilts with use of mirror for visual feedback to maintain trunk in midline during forwards weight shifting and equal WB through bilat LE.  Transitioned to forwards weight shifting reaching to touch target in front of pt forwards and to the L to promote active L lateral and forwards weight shift with therapist stabilizing pelvis and LLE.  Transitioned to active forwards and L lateral weight shifting during sit <> stand and standing weight shifting reaching [name] up, forwards and to the L along target.  Performed alternating weight shifting and forwards/retro/lateral stepping with RLE during LLE stance with therapist providing facilitation at L knee for extension and L trunk for elongation and excursion to L.  Pt fatigued quickly and required constant verbal, visual and tactile cues to attend to task and motor plan task.                  PT Education - 12/16/16 1657    Education provided Yes   Education Details wedge under R pelvis when sitting in w/c at home to provide constant feedback for L lateral weight shiftin   Person(s) Educated Patient;Child(ren)   Methods Explanation;Demonstration   Comprehension Verbalized understanding          PT Short Term Goals - 12/16/16 1703      PT SHORT TERM GOAL #1   Title Pt/caregiver will be independent with initial HEP in order to indicate improved functional mobility and decreased fall risk.  (Target Date: 01/04/17)   Status On-going     PT SHORT TERM GOAL #2   Title Will assess  5TSS and improve time by 5 secs in order to indicate improved functional strength.     Baseline 28.81 seconds with mod-max A of therapist to perform safely   Status Revised     PT SHORT TERM GOAL #3   Title Pt will perform squat pivot transfers to the R or L consistently at min A level in order to increase independence with functional transfers.    Status On-going     PT SHORT TERM GOAL #4   Title Pt will perform dynamic standing balance at  counter top with single UE support x 3 mins at min/guard level in order to improve independence with ADLs.    Status On-going     PT SHORT TERM GOAL #5   Title Pt will ambulate x 25' w/ LRAD at min A level over indoor surfaces in order to increase functional mobility at home.    Status On-going           PT Long Term Goals - 12/05/16 1930      PT LONG TERM GOAL #1   Title Pt/caregiver will be independent with final HEP in order to indicate improved functional mobility and decreased fall risk.  (Target Date: 02/03/17)   Status New     PT LONG TERM GOAL #2   Title Pt will improve 5TSS by 10 secs from baseline in order to indicate improved functional strength.     Status New     PT LONG TERM GOAL #3   Title Pt will perform stand pivot transfers w/ LRAD at S level in order to indicate improved functional independence.     Status New     PT LONG TERM GOAL #4   Title Pt will perform dynamic standing balance with single UE support (reaching upwards and laterally with opposite UE) x 5 mins at S level in order to indicate increased independence with ADLs.    Status New     PT LONG TERM GOAL #5   Title Pt will ambulate 86' w/ LRAD over indoor surfaces at S level in order to indicate improved functional independence.     Status New               Plan - 12/16/16 1659    Clinical Impression Statement Treatment session today with focus on further assessment of functional sit > stand transfers with 5 times sit to stand; pt required assistance  from therapist to complete test and required 28 seconds to perform.  Rest of session focused on NMR for trunk/pelvic motor planning/initiation for weight shifting, orientation to midline and active weight shifting to L side in sitting, during sit <> stand, in standing and pre-gait activities.  Pt fatigued quickly and required multiple seated rest breaks.  By end of session pt demonstrated improved sitting posture in midline (leaning back and to the R at beginning of session).  Discussed use of wedge in w/c at home for prolonged weight shift L.  Will continue to address.   Rehab Potential Good   Clinical Impairments Affecting Rehab Potential previous history, cognitive deficits-poor carryover   PT Frequency 2x / week   PT Duration 4 weeks   PT Treatment/Interventions ADLs/Self Care Home Management;Electrical Stimulation;DME Instruction;Gait training;Stair training;Functional mobility training;Therapeutic activities;Therapeutic exercise;Balance training;Neuromuscular re-education;Cognitive remediation;Patient/family education;Orthotic Fit/Training;Passive range of motion   PT Next Visit Plan work on [employer], sit<>stand, develop HEP further for L weight shift, NMR for L LE extension in standing, gait activities, repetition of movement (*functional training for weight shifting reaching into cabinets, etc)   Consulted and Agree with Plan of Care Patient;Family member/caregiver   Family Member Consulted daughter in law Dena      Patient will benefit from skilled therapeutic intervention in order to improve the following deficits and impairments:  Abnormal gait, Decreased activity tolerance, Decreased balance, Decreased cognition, Decreased coordination, Decreased endurance, Decreased knowledge of use of DME, Decreased mobility, Decreased range of motion, Decreased strength, Increased muscle spasms, Impaired perceived functional ability, Impaired flexibility, Impaired sensation, Impaired tone, Postural  dysfunction  Visit Diagnosis:  Other abnormalities of gait and mobility  Unsteadiness on feet  Muscle weakness (generalized)  Spastic hemiplegia of left nondominant side as late effect of cerebral infarction [facility])  Other disturbances of skin sensation     Problem List Patient Active Problem List   Diagnosis Date Noted  . Somnolence, daytime   . Acute ischemic left MCA stroke (HCC) 08/06/2016  . Acute urinary retention 08/06/2016  . Aphasia   . Depression 02/03/2016  . Insomnia 02/03/2016  . Chronic musculoskeletal pain 02/03/2016  . GERD (gastroesophageal reflux disease) 12/31/2015  . Pressure ulcer 12/23/2015  . Atrial fibrillation (HCC) 06/06/2015  . Embolic stroke (HCC) 03/22/2015  . Left hemiparesis (HCC)   . CVA (cerebral vascular accident) (HCC)   . H/O TIA (transient ischemic attack) and stroke   . Spinal stenosis, lumbar region, with neurogenic claudication 01/17/2015  . Lumbar stenosis 01/17/2015  . 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 Essential hypertension 11/10/2014  . HLD (hyperlipidemia) 11/10/2014  . Chronic back pain 11/08/2014  . Cerebral infarction due to thrombosis of cerebral artery (HCC)   . Hyperlipidemia LDL goal <70 09/07/2014  . Presumed CVA (cerebral infarction) s/p IV tPA, MRI negative 09/05/2014  . Hypertensive urgency 09/05/2014  . Right bundle branch block 03/21/2014  . Cough 10/31/2012  . Elevated lipase 10/30/2012  . HTN (hypertension) 10/30/2012  . Hypothyroidism 10/30/2012   Edman Circle, PT, DPT 12/16/16    5:05 PM    [facility] [address] [place], [place], [address] Phone: [phone]   Fax:  [phone]  Name: Angela Keith MRN: [id number] Date of Birth: 08/01/1929

## 2016-12-18 ENCOUNTER — Encounter: Payer: Self-pay | Admitting: Physical Therapy

## 2016-12-18 ENCOUNTER — Ambulatory Visit: Payer: Medicare Other | Admitting: Physical Therapy

## 2016-12-18 DIAGNOSIS — R2689 Other abnormalities of gait and mobility: Secondary | ICD-10-CM | POA: Diagnosis not present

## 2016-12-18 DIAGNOSIS — R2681 Unsteadiness on feet: Secondary | ICD-10-CM

## 2016-12-18 DIAGNOSIS — I69354 Hemiplegia and hemiparesis following cerebral infarction affecting left non-dominant side: Secondary | ICD-10-CM

## 2016-12-18 DIAGNOSIS — M6281 Muscle weakness (generalized): Secondary | ICD-10-CM

## 2016-12-18 DIAGNOSIS — R208 Other disturbances of skin sensation: Secondary | ICD-10-CM

## 2016-12-18 NOTE — Therapy (Signed)
[facility] [address] [place], [place], [address] Phone: [phone]   Fax:  [phone]  Physical Therapy Treatment  Patient Details  Name: Angela Keith MRN: [id number] Date of Birth: [birth year] Referring Provider: Marden Noble, MD  Encounter Date: 12/18/2016      PT End of Session - 12/18/16 1651    Visit Number 5   Number of Visits 13   Date for PT Re-Evaluation 02/03/17   Authorization Type MCR G Code on every 10th visit   PT Start Time 1448   PT Stop Time 1531   PT Time Calculation (min) 43 min   Activity Tolerance Patient limited by fatigue;No increased pain   Behavior During Therapy WFL for tasks assessed/performed      Past Medical History:  Diagnosis Date  . Arthritis   . Atrial fibrillation (HCC)   . Chronic neck pain   . DJD (degenerative joint disease), cervical   . DJD (degenerative joint disease), lumbar   . Dysphagia   . GERD (gastroesophageal reflux disease)   . Glaucoma   . HTN (hypertension) 10/30/2012  . Hyperlipidemia   . Hypothyroidism   . Insomnia   . Nocturia   . Right bundle branch block 03/21/2014  . Rotoscoliosis   . Severe hearing loss   . Shortness of breath dyspnea   . Stroke (HCC)   . Unspecified hypothyroidism 10/30/2012    Past Surgical History:  Procedure Laterality Date  . BACK SURGERY     lower back   . bladder laser surgery    . BREAST SURGERY    . cataract surgery    . EP IMPLANTABLE DEVICE N/A 01/19/2015   Procedure: Loop Recorder Insertion;  Surgeon: Marinus Maw, MD;  Location: [facility] INVASIVE CV LAB;  Service: Cardiovascular;  Laterality: N/A;  . ESOPHAGOGASTRODUODENOSCOPY (EGD) WITH ESOPHAGEAL DILATION  06/08/2012   Procedure: ESOPHAGOGASTRODUODENOSCOPY (EGD) WITH ESOPHAGEAL DILATION;  Surgeon: Charolett Bumpers, MD;  Location: [facility] ENDOSCOPY;  Service: Endoscopy;  Laterality: N/A;  . facail surgery  2000   facelift and eyelid lift  . left carpal tunnel  surgery    . LUMBAR LAMINECTOMY/DECOMPRESSION MICRODISCECTOMY N/A 01/17/2015   Procedure: Laminectomy and Foraminotomy - L2-L3 - L3-L4;  Surgeon: [name], MD;  Location: [facility] NEURO ORS;  Service: Neurosurgery;  Laterality: N/A;  Laminectomy and Foraminotomy - L2-L3 - L3-L4  . TEE WITHOUT CARDIOVERSION N/A 01/19/2015   Procedure: TRANSESOPHAGEAL ECHOCARDIOGRAM (TEE);  Surgeon: Chrystie Nose, MD;  Location: [facility] ENDOSCOPY;  Service: Cardiovascular;  Laterality: N/A;    There were no vitals filed for this visit.      Subjective Assessment - 12/18/16 1455    Subjective Reports having some back pain last night; feels like it may be from all the exercises the other day and having the wedge under her right hip; removed it from w/c.  Discussed possibly placing it under her right hip for 1-2 hours and then removing.     Patient is accompained by: Family member   Pertinent History CVA x 4 in history, HOH    Limitations House hold activities;Walking   Patient Stated Goals "I want to walk with a walker."    Currently in Pain? No/denies                         [facility] Adult PT Treatment/Exercise - 12/18/16 1646      Transfers   Transfers Sit to Stand;Stand to Sit;Squat Pivot  Transfers   Sit to Stand 2: Max assist;4: Min assist   Stand to Sit 3: Mod assist   Squat Pivot Transfers 3: Mod assist   Squat Pivot Transfer Details (indicate cue type and reason) to maintain squat and forwards weight shift while pivoting     Neuro Re-ed    Neuro Re-ed Details  Performing sit > stand from mat with [name] reaching forwards, up and to the L sliding along tall table for weight shift to L during sit <> stand x 4 reps with max A initially to WB through LLE progressing to min A.  Performed static standing x 4 reps with R foot wedged on 4" step to facilitate weight shift and WB through LLE while standing at table to work on word search puzzle with marker in RUE to decrease UE support.  Required max A  verbal and tactile cues to maintain activation in LLE-only able to stand to find 1-3 words at a time before needing rest break.  FInished with standing with UE support on tall table performing 10 reps weight shifting to L during RLE taps on/off of step with therapist providing cues for LLE sustained activation and forwards pelvic rotation.                    PT Short Term Goals - 12/16/16 1703      PT SHORT TERM GOAL #1   Title Pt/caregiver will be independent with initial HEP in order to indicate improved functional mobility and decreased fall risk.  (Target Date: 01/04/17)   Status On-going     PT SHORT TERM GOAL #2   Title Will assess 5TSS and improve time by 5 secs in order to indicate improved functional strength.     Baseline 28.81 seconds with mod-max A of therapist to perform safely   Status Revised     PT SHORT TERM GOAL #3   Title Pt will perform squat pivot transfers to the R or L consistently at min A level in order to increase independence with functional transfers.    Status On-going     PT SHORT TERM GOAL #4   Title Pt will perform dynamic standing balance at counter top with single UE support x 3 mins at min/guard level in order to improve independence with ADLs.    Status On-going     PT SHORT TERM GOAL #5   Title Pt will ambulate x 25' w/ LRAD at min A level over indoor surfaces in order to increase functional mobility at home.    Status On-going           PT Long Term Goals - 12/05/16 1930      PT LONG TERM GOAL #1   Title Pt/caregiver will be independent with final HEP in order to indicate improved functional mobility and decreased fall risk.  (Target Date: 02/03/17)   Status New     PT LONG TERM GOAL #2   Title Pt will improve 5TSS by 10 secs from baseline in order to indicate improved functional strength.     Status New     PT LONG TERM GOAL #3   Title Pt will perform stand pivot transfers w/ LRAD at S level in order to indicate improved  functional independence.     Status New     PT LONG TERM GOAL #4   Title Pt will perform dynamic standing balance with single UE support (reaching upwards and laterally with opposite UE) x 5 mins  at S level in order to indicate increased independence with ADLs.    Status New     PT LONG TERM GOAL #5   Title Pt will ambulate 3950' w/ LRAD over indoor surfaces at S level in order to indicate improved functional independence.     Status New               Plan - 12/18/16 1651    Clinical Impression Statement Continued to focus on NMR for active weight shifting to L and sustained WB through LLE but incorporated standing activity that pt was able to maintain attention on (word search puzzle).  Pt continued to be limited by fatigue but pt better able to attend to task and required less cues today.  By end of session pt demonstrating improved weight shift, WB and activation of LLE during sit<> stand.  Will continue to address.  Advised daughter to only wedge R hip (for L weight shift) in w/c 1-2 hours a day due to back pain; will work towards longer periods of time with pelvis wedged laterally.   Rehab Potential Good   Clinical Impairments Affecting Rehab Potential previous history, cognitive deficits-poor carryover   PT Frequency 2x / week   PT Duration 4 weeks   PT Treatment/Interventions ADLs/Self Care Home Management;Electrical Stimulation;DME Instruction;Gait training;Stair training;Functional mobility training;Therapeutic activities;Therapeutic exercise;Balance training;Neuromuscular re-education;Cognitive remediation;Patient/family education;Orthotic Fit/Training;Passive range of motion   PT Next Visit Plan  sit<>stand, develop HEP further for L weight shift, NMR for L LE extension in standing, gait activities, repetition of movement (*functional training for weight shifting reaching into cabinets, etc), wedge R side for L weight shift.    Consulted and Agree with Plan of Care Patient;Family  member/caregiver      Patient will benefit from skilled therapeutic intervention in order to improve the following deficits and impairments:  Abnormal gait, Decreased activity tolerance, Decreased balance, Decreased cognition, Decreased coordination, Decreased endurance, Decreased knowledge of use of DME, Decreased mobility, Decreased range of motion, Decreased strength, Increased muscle spasms, Impaired perceived functional ability, Impaired flexibility, Impaired sensation, Impaired tone, Postural dysfunction  Visit Diagnosis: Other abnormalities of gait and mobility  Unsteadiness on feet  Muscle weakness (generalized)  Spastic hemiplegia of left nondominant side as late effect of cerebral infarction (HCC)  Other disturbances of skin sensation     Problem List Patient Active Problem List   Diagnosis Date Noted  . Somnolence, daytime   . Acute ischemic left MCA stroke (HCC) 08/06/2016  . Acute urinary retention 08/06/2016  . Aphasia   . Depression 02/03/2016  . Insomnia 02/03/2016  . Chronic musculoskeletal pain 02/03/2016  . GERD (gastroesophageal reflux disease) 12/31/2015  . Pressure ulcer 12/23/2015  . Atrial fibrillation (HCC) 06/06/2015  . Embolic stroke (HCC) 03/22/2015  . Left hemiparesis (HCC)   . CVA (cerebral vascular accident) (HCC)   . H/O TIA (transient ischemic attack) and stroke   . Spinal stenosis, lumbar region, with neurogenic claudication 01/17/2015  . Lumbar stenosis 01/17/2015  . 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 Essential hypertension 11/10/2014  . HLD (hyperlipidemia) 11/10/2014  . Chronic back pain 11/08/2014  . Cerebral infarction due to thrombosis of cerebral artery (HCC)   . Hyperlipidemia LDL goal <70 09/07/2014  . Presumed CVA (cerebral infarction) s/p IV tPA, MRI negative 09/05/2014  . Hypertensive urgency 09/05/2014  . Right bundle branch block 03/21/2014  . Cough 10/31/2012  . Elevated lipase 10/30/2012  . HTN  (hypertension) 10/30/2012  . Hypothyroidism 10/30/2012   Bufford LopeAudra  Edom, Harriman, DPT 12/18/16    4:56 PM    [facility] [address] [place], [place], [address] Phone: [phone]   Fax:  [phone]  Name: LADYE MACNAUGHTON MRN: [id number] Date of Birth: 15-Dec-1929

## 2016-12-23 ENCOUNTER — Ambulatory Visit: Payer: Medicare Other | Admitting: Rehabilitation

## 2016-12-26 ENCOUNTER — Encounter: Payer: Self-pay | Admitting: Internal Medicine

## 2016-12-27 ENCOUNTER — Encounter: Payer: Self-pay | Admitting: Rehabilitation

## 2016-12-27 ENCOUNTER — Ambulatory Visit: Payer: Medicare Other | Admitting: Rehabilitation

## 2016-12-27 DIAGNOSIS — I69354 Hemiplegia and hemiparesis following cerebral infarction affecting left non-dominant side: Secondary | ICD-10-CM | POA: Diagnosis not present

## 2016-12-27 DIAGNOSIS — M6281 Muscle weakness (generalized): Secondary | ICD-10-CM | POA: Diagnosis not present

## 2016-12-27 DIAGNOSIS — R208 Other disturbances of skin sensation: Secondary | ICD-10-CM | POA: Diagnosis not present

## 2016-12-27 DIAGNOSIS — R2681 Unsteadiness on feet: Secondary | ICD-10-CM

## 2016-12-27 DIAGNOSIS — R2689 Other abnormalities of gait and mobility: Secondary | ICD-10-CM | POA: Diagnosis not present

## 2016-12-27 NOTE — Therapy (Signed)
[facility] [address] [place], [place], [address] Phone: [phone]   Fax:  [phone]  Physical Therapy Treatment  Patient Details  Name: Angela Keith MRN: [id number] Date of Birth: [birth year] Referring Provider: Marden Nobleobert Gates, MD  Encounter Date: 12/27/2016      PT End of Session - 12/27/16 1313    Visit Number 6   Number of Visits 13   Date for PT Re-Evaluation 02/03/17   Authorization Type MCR G Code on every 10th visit   PT Start Time 1103   PT Stop Time 1146   PT Time Calculation (min) 43 min   Activity Tolerance Patient limited by fatigue;No increased pain   Behavior During Therapy WFL for tasks assessed/performed      Past Medical History:  Diagnosis Date  . Arthritis   . Atrial fibrillation (HCC)   . Chronic neck pain   . DJD (degenerative joint disease), cervical   . DJD (degenerative joint disease), lumbar   . Dysphagia   . GERD (gastroesophageal reflux disease)   . Glaucoma   . HTN (hypertension) 10/30/2012  . Hyperlipidemia   . Hypothyroidism   . Insomnia   . Nocturia   . Right bundle branch block 03/21/2014  . Rotoscoliosis   . Severe hearing loss   . Shortness of breath dyspnea   . Stroke (HCC)   . Unspecified hypothyroidism 10/30/2012    Past Surgical History:  Procedure Laterality Date  . BACK SURGERY     lower back   . bladder laser surgery    . BREAST SURGERY    . cataract surgery    . EP IMPLANTABLE DEVICE N/A 01/19/2015   Procedure: Loop Recorder Insertion;  Surgeon: Marinus MawGregg W Taylor, MD;  [facility];  Service: Cardiovascular;  Laterality: N/A;  . ESOPHAGOGASTRODUODENOSCOPY (EGD) WITH ESOPHAGEAL DILATION  06/08/2012   Procedure: ESOPHAGOGASTRODUODENOSCOPY (EGD) WITH ESOPHAGEAL DILATION;  Surgeon: Charolett BumpersMartin K Johnson, MD;  Location: [facility] ENDOSCOPY;  Service: Endoscopy;  Laterality: N/A;  . facail surgery  2000   facelift and eyelid lift  . left carpal tunnel  surgery    . LUMBAR LAMINECTOMY/DECOMPRESSION MICRODISCECTOMY N/A 01/17/2015   Procedure: Laminectomy and Foraminotomy - L2-L3 - L3-L4;  Surgeon: Julio SicksHenry Pool, MD;  Location: [facility] NEURO ORS;  Service: Neurosurgery;  Laterality: N/A;  Laminectomy and Foraminotomy - L2-L3 - L3-L4  . TEE WITHOUT CARDIOVERSION N/A 01/19/2015   Procedure: TRANSESOPHAGEAL ECHOCARDIOGRAM (TEE);  Surgeon: Chrystie NoseKenneth C Hilty, MD;  [facility];  Service: Cardiovascular;  Laterality: N/A;    There were no vitals filed for this visit.      Subjective Assessment - 12/27/16 1104    Subjective Daughter in law reports a fall last Saturday, was reaching forward and fell from chair. No injury.    Patient is accompained by: Family member   Pertinent History CVA x 4 in history, HOH    Limitations House hold activities;Walking   Patient Stated Goals "I want to walk with a walker."    Currently in Pain? No/denies                         [facility] Adult [facility] Treatment/Exercise - 12/27/16 0001      Ambulation/Gait   Ambulation/Gait Yes   Ambulation/Gait Assistance 4: Min assist;3: Mod assist;2: Max assist   Ambulation/Gait Assistance Details Pt with very varying levels of assist during session depending on fatigue level and pushing.  Note that to  begin with pt min A and did fairly well staying tall and activating LLE. With cues for posture and increased R step length, this improved, however with increased distance, she requires more assist and tends to ambulate in very flexed position and needing to sit for break.     Ambulation Distance (Feet) 22 Feet  then another 15   Assistive device Rolling walker   Gait Pattern Step-to pattern;Decreased step length - right;Decreased stance time - left;Decreased stride length;Decreased dorsiflexion - left;Decreased weight shift to left;Left flexed knee in stance;Trunk flexed;Poor foot clearance - left   Ambulation Surface Level;Indoor     Neuro Re-ed    Neuro Re-ed Details   Performed squat pivot transfers during session x 4 reps in order to work on forward trunk lean for improved safety and improved activation of LLE.  She has marked difficulty with forward trunk lean and weight shift and demos coming straight up almost into standing position vs remaining low.  Went over this in blocked practice for improved carryover.  progressed to standing with rolling table to the L having her try to push item to left of table, however pt very anxious without support, therefore moved to counter top for increased UE support.  While in standing PT provided assist anteriorly to L knee to encourage extension and assist at pelvis to improve L lateral weight shift.  Had to "clean" counter top and upper L cabinets x 2 mins.  Again, pt needs to sit and fatigues quickly.  PT provided this for home exercise as long as daughter in law has one other person to assist.  PT demonstrated on daughter in law how to assist pt with this at home.  Then had pt work on tapping RLE to inside bottom cabinet x 10 reps, again with PT assisting at L knee and pelvis. Also provided education to family to begin similar task at home with daughter in law and one other person assisting to have her lift R LE, side step RLE, etc.  as able.                  PT Education - 12/27/16 1312    Education provided Yes   Education Details things to do at home to encourage standing on LLE>    Person(s) Educated Patient;Child(ren)   Methods Explanation;Demonstration   Comprehension Verbalized understanding          PT Short Term Goals - 12/16/16 1703      PT SHORT TERM GOAL #1   Title Pt/caregiver will be independent with initial HEP in order to indicate improved functional mobility and decreased fall risk.  (Target Date: 01/04/17)   Status On-going     PT SHORT TERM GOAL #2   Title Will assess 5TSS and improve time by 5 secs in order to indicate improved functional strength.     Baseline 28.81 seconds with  mod-max A of therapist to perform safely   Status Revised     PT SHORT TERM GOAL #3   Title Pt will perform squat pivot transfers to the R or L consistently at min A level in order to increase independence with functional transfers.    Status On-going     PT SHORT TERM GOAL #4   Title Pt will perform dynamic standing balance at counter top with single UE support x 3 mins at min/guard level in order to improve independence with ADLs.    Status On-going     PT SHORT TERM GOAL #  5   Title Pt will ambulate x 25' w/ LRAD at min A level over indoor surfaces in order to increase functional mobility at home.    Status On-going           PT Long Term Goals - 12/05/16 1930      PT LONG TERM GOAL #1   Title Pt/caregiver will be independent with final HEP in order to indicate improved functional mobility and decreased fall risk.  (Target Date: 02/03/17)   Status New     PT LONG TERM GOAL #2   Title Pt will improve 5TSS by 10 secs from baseline in order to indicate improved functional strength.     Status New     PT LONG TERM GOAL #3   Title Pt will perform stand pivot transfers w/ LRAD at S level in order to indicate improved functional independence.     Status New     PT LONG TERM GOAL #4   Title Pt will perform dynamic standing balance with single UE support (reaching upwards and laterally with opposite UE) x 5 mins at S level in order to indicate increased independence with ADLs.    Status New     PT LONG TERM GOAL #5   Title Pt will ambulate 52' w/ LRAD over indoor surfaces at S level in order to indicate improved functional independence.     Status New               Plan - 12/27/16 1313    Clinical Impression Statement Skilled session continues to focus on LLE NMR for improved weight shift and LLE WB.  Attempted to do so with more functional tasks as to make more automatic.  Provided this for home as long as there are two people to assist pt.  Pt and daughter in law  verbalized understanding.    Rehab Potential Good   Clinical Impairments Affecting Rehab Potential previous history, cognitive deficits-poor carryover   PT Frequency 2x / week   PT Duration 4 weeks   PT Treatment/Interventions ADLs/Self Care Home Management;Electrical Stimulation;DME Instruction;Gait training;Stair training;Functional mobility training;Therapeutic activities;Therapeutic exercise;Balance training;Neuromuscular re-education;Cognitive remediation;Patient/family education;Orthotic Fit/Training;Passive range of motion   PT Next Visit Plan  sit<>stand, develop HEP further for L weight shift, NMR for L LE extension in standing, gait activities, repetition of movement (*functional training for weight shifting reaching into cabinets, etc), wedge R side for L weight shift.    Consulted and Agree with Plan of Care Patient;Family member/caregiver      Patient will benefit from skilled therapeutic intervention in order to improve the following deficits and impairments:  Abnormal gait, Decreased activity tolerance, Decreased balance, Decreased cognition, Decreased coordination, Decreased endurance, Decreased knowledge of use of DME, Decreased mobility, Decreased range of motion, Decreased strength, Increased muscle spasms, Impaired perceived functional ability, Impaired flexibility, Impaired sensation, Impaired tone, Postural dysfunction  Visit Diagnosis: Other abnormalities of gait and mobility  Unsteadiness on feet  Muscle weakness (generalized)  Spastic hemiplegia of left nondominant side as late effect of cerebral infarction [facility])     Problem List Patient Active Problem List   Diagnosis Date Noted  . Somnolence, daytime   . Acute ischemic left MCA stroke (HCC) 08/06/2016  . Acute urinary retention 08/06/2016  . Aphasia   . Depression 02/03/2016  . Insomnia 02/03/2016  . Chronic musculoskeletal pain 02/03/2016  . GERD (gastroesophageal reflux disease) 12/31/2015  . Pressure  ulcer 12/23/2015  . Atrial fibrillation (HCC) 06/06/2015  . Embolic stroke (  HCC) 03/22/2015  . Left hemiparesis (HCC)   . CVA (cerebral vascular accident) (HCC)   . H/O TIA (transient ischemic attack) and stroke   . Spinal stenosis, lumbar region, with neurogenic claudication 01/17/2015  . Lumbar stenosis 01/17/2015  . 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 Essential hypertension 11/10/2014  . HLD (hyperlipidemia) 11/10/2014  . Chronic back pain 11/08/2014  . Cerebral infarction due to thrombosis of cerebral artery (HCC)   . Hyperlipidemia LDL goal <70 09/07/2014  . Presumed CVA (cerebral infarction) s/p IV tPA, MRI negative 09/05/2014  . Hypertensive urgency 09/05/2014  . Right bundle branch block 03/21/2014  . Cough 10/31/2012  . Elevated lipase 10/30/2012  . HTN (hypertension) 10/30/2012  . Hypothyroidism 10/30/2012    Harriet Butte, PT, MPT [facility] [address] [place], [place], [address] Phone: [phone]   Fax:  [phone] 12/27/16, 1:17 PM  Name: ARIELYS WANDERSEE MRN: [id number] Date of Birth: [birth year]

## 2016-12-31 ENCOUNTER — Encounter: Payer: Self-pay | Admitting: Physical Therapy

## 2016-12-31 ENCOUNTER — Ambulatory Visit: Payer: Medicare Other | Admitting: Physical Therapy

## 2016-12-31 VITALS — BP 150/80 | HR 55

## 2016-12-31 DIAGNOSIS — R2681 Unsteadiness on feet: Secondary | ICD-10-CM | POA: Diagnosis not present

## 2016-12-31 DIAGNOSIS — R2689 Other abnormalities of gait and mobility: Secondary | ICD-10-CM | POA: Diagnosis not present

## 2016-12-31 DIAGNOSIS — M6281 Muscle weakness (generalized): Secondary | ICD-10-CM

## 2016-12-31 DIAGNOSIS — R208 Other disturbances of skin sensation: Secondary | ICD-10-CM | POA: Diagnosis not present

## 2016-12-31 DIAGNOSIS — I69354 Hemiplegia and hemiparesis following cerebral infarction affecting left non-dominant side: Secondary | ICD-10-CM | POA: Diagnosis not present

## 2016-12-31 NOTE — Therapy (Signed)
[facility] [address] [place], [place], [address] Phone: [phone]   Fax:  [phone]  Physical Therapy Treatment  Patient Details  Name: Angela Keith MRN: [id number] Date of Birth: 1929/09/12 Referring Provider: Marden Noble, MD  Encounter Date: 12/31/2016      PT End of Session - 12/31/16 1129    Visit Number 7   Number of Visits 13   Date for PT Re-Evaluation 02/03/17   Authorization Type MCR G Code on every 10th visit   PT Start Time 1016   PT Stop Time 1103   PT Time Calculation (min) 47 min   Activity Tolerance No increased pain   Behavior During Therapy [facility] for tasks assessed/performed      Past Medical History:  Diagnosis Date  . Arthritis   . Atrial fibrillation (HCC)   . Chronic neck pain   . DJD (degenerative joint disease), cervical   . DJD (degenerative joint disease), lumbar   . Dysphagia   . GERD (gastroesophageal reflux disease)   . Glaucoma   . HTN (hypertension) 10/30/2012  . Hyperlipidemia   . Hypothyroidism   . Insomnia   . Nocturia   . Right bundle branch block 03/21/2014  . Rotoscoliosis   . Severe hearing loss   . Shortness of breath dyspnea   . Stroke (HCC)   . Unspecified hypothyroidism 10/30/2012    Past Surgical History:  Procedure Laterality Date  . BACK SURGERY     lower back   . bladder laser surgery    . BREAST SURGERY    . cataract surgery    . EP IMPLANTABLE DEVICE N/A 01/19/2015   Procedure: Loop Recorder Insertion;  Surgeon: Marinus Maw, MD;  Location: [facility] INVASIVE CV LAB;  Service: Cardiovascular;  Laterality: N/A;  . ESOPHAGOGASTRODUODENOSCOPY (EGD) WITH ESOPHAGEAL DILATION  06/08/2012   Procedure: ESOPHAGOGASTRODUODENOSCOPY (EGD) WITH ESOPHAGEAL DILATION;  Surgeon: Charolett Bumpers, MD;  Location: [facility] ENDOSCOPY;  Service: Endoscopy;  Laterality: N/A;  . facail surgery  2000   facelift and eyelid lift  . left carpal tunnel surgery    . LUMBAR  LAMINECTOMY/DECOMPRESSION MICRODISCECTOMY N/A 01/17/2015   Procedure: Laminectomy and Foraminotomy - L2-L3 - L3-L4;  Surgeon: [name], MD;  Location: [facility] NEURO ORS;  Service: Neurosurgery;  Laterality: N/A;  Laminectomy and Foraminotomy - L2-L3 - L3-L4  . TEE WITHOUT CARDIOVERSION N/A 01/19/2015   Procedure: TRANSESOPHAGEAL ECHOCARDIOGRAM (TEE);  Surgeon: Chrystie Nose, MD;  Location: [facility] ENDOSCOPY;  Service: Cardiovascular;  Laterality: N/A;    Vitals:   12/31/16 1025  BP: (!) 150/80  Pulse: (!) 55        Subjective Assessment - 12/31/16 1022    Subjective Reports a little pain after last session; daughter in law states that it was a little sternal pain in midline but it resolved quickly.  No pain today.  Pt did not want to do the standing exercises with daughter in law at home; pt felt too nervous.   Patient is accompained by: Family member   Pertinent History CVA x 4 in history, HOH    Limitations House hold activities;Walking   Patient Stated Goals "I want to walk with a walker."    Currently in Pain? No/denies           [facility] Adult PT Treatment/Exercise - 12/31/16 1124      Transfers   Squat Pivot Transfers 3: Mod assist   Squat Pivot Transfer Details (indicate cue type and reason)  to R side pt performed squat scooting in 3 reps with verbal and visual cues for hand placement, trunk lateral lean and weight shift to L to advance buttocks to R, when transferring back to L side pt able to maintain squat during full pivot with assistance to keep COG forwards over BOS during transfer.;     Exercises   Exercises Knee/Hip;Lumbar;Ankle     Lumbar Exercises: Stretches   Passive Hamstring Stretch 2 reps;30 seconds   Passive Hamstring Stretch Limitations LLE   Hip Flexor Stretch 2 reps;30 seconds   Hip Flexor Stretch Limitations LLE off side of mat   Piriformis Stretch 2 reps;30 seconds   Piriformis Stretch Limitations supine, LLE flexed and in IR with caregiver stabilizing L  pelvis     Knee/Hip Exercises: Stretches   Theme park managerGastroc Stretch Left;2 reps;30 seconds   Gastroc Stretch Limitations supine   Other Knee/Hip Stretches Hip ADD stretch with LLE in flexion and ER with helper stabilizing R pelvis x 2 reps x 30 seconds each     Knee/Hip Exercises: Supine   Bridges AAROM;Strengthening;Left;Right;5 reps   Bridges Limitations verbal cues for glute activation and to lift L pelvis first   Other Supine Knee/Hip Exercises Active assisted L hip ABD with LE straight with verbal cues for correct mm activation to decrease substitution with hip flexion and ER; 5 reps      CAREGIVER ASSISTED: Hamstrings - Supine    Caregiver holds leg at ankle and over knee; raises leg straight. Hold __30_ seconds. _2__ reps per set. 1x/day.   Copyright  VHI. All rights reserved.  CAREGIVER ASSISTED: Ankle Dorsiflexion    Caregiver cups hand under heel, rests foot on forearm; leans forward to move foot toward head. Hold _30__ seconds. __2_ reps per set. 1x/day.Adduction / Internal Rotation: ROM (Supine)    Position (A) Patient: Bend left leg, foot flat on bed. Helper: Stabilize left hip. Place other hand on knee. Motion (B) -Push knee toward opposite side of body. -Stop at point of tension, hold about 30 seconds Repeat _2__ times. Do _2__ sessions per day.   Copyright  VHI. All rights reserved.  Abduction / External Rotation: ROM (Supine)    Position (A) Patient: Bend left leg, foot flat on bed. Helper: Stabilize opposite hip. Place other hand on knee. Motion (B) -Move knee out to side, pelvis remains flat on bed. -Stop at point of tension in inner thigh, hold 30 seconds. Repeat _2__ times. Do __2_ sessions per day.   Hip Flexor Stretch    Lying on back near edge of bed, bend right leg, foot flat. Hang left leg over edge, relaxed, thigh resting entirely on bed for __1-2__ minutes. Repeat _2___ times. Do __2__ sessions per day.  Abduction: ROM (Supine)    Position  (A) Helper: Support left leg, keeping knee straight. Stabilize other leg, if necessary, to prevent it from moving. Motion (B) - Glide leg out to side, keeping toes pointing toward ceiling with Fleet Contrasachel helping. - Stop at point of tension in inner thigh. Repeat _10__ times. Do _2__ sessions per day.   Bridge    Lying on back, legs bent 90, feet flat on bed. Squeeze your butt first, lift left hip first!  Hold for 3-5 seconds           PT Education - 12/31/16 1123    Education provided Yes   Education Details added stretches and strengthening to AT&THEP   Person(s) Educated Patient;Child(ren)   Methods Demonstration;Explanation;Handout   Comprehension  Verbalized understanding;Returned demonstration          PT Short Term Goals - 12/16/16 1703      PT SHORT TERM GOAL #1   Title Pt/caregiver will be independent with initial HEP in order to indicate improved functional mobility and decreased fall risk.  (Target Date: 01/04/17)   Status On-going     PT SHORT TERM GOAL #2   Title Will assess 5TSS and improve time by 5 secs in order to indicate improved functional strength.     Baseline 28.81 seconds with mod-max A of therapist to perform safely   Status Revised     PT SHORT TERM GOAL #3   Title Pt will perform squat pivot transfers to the R or L consistently at min A level in order to increase independence with functional transfers.    Status On-going     PT SHORT TERM GOAL #4   Title Pt will perform dynamic standing balance at counter top with single UE support x 3 mins at min/guard level in order to improve independence with ADLs.    Status On-going     PT SHORT TERM GOAL #5   Title Pt will ambulate x 25' w/ LRAD at min A level over indoor surfaces in order to increase functional mobility at home.    Status On-going           PT Long Term Goals - 12/05/16 1930      PT LONG TERM GOAL #1   Title Pt/caregiver will be independent with final HEP in order to indicate  improved functional mobility and decreased fall risk.  (Target Date: 02/03/17)   Status New     PT LONG TERM GOAL #2   Title Pt will improve 5TSS by 10 secs from baseline in order to indicate improved functional strength.     Status New     PT LONG TERM GOAL #3   Title Pt will perform stand pivot transfers w/ LRAD at S level in order to indicate improved functional independence.     Status New     PT LONG TERM GOAL #4   Title Pt will perform dynamic standing balance with single UE support (reaching upwards and laterally with opposite UE) x 5 mins at S level in order to indicate increased independence with ADLs.    Status New     PT LONG TERM GOAL #5   Title Pt will ambulate 24' w/ LRAD over indoor surfaces at S level in order to indicate improved functional independence.     Status New               Plan - 12/31/16 1130    Clinical Impression Statement Due to slightly elevated BP, held on more challenging activities today (standing, gait to check STG) and instead focused on adding supine stretches and strengthening exercises to pt's HEP since pt will not be attending therapy next week due to vacation with family.  Demonstrated all exercises to daughter and provided hand outs.  Daughter states she will take the exercises with them to the beach and perform them there.  Daughter notes increased active movement in LLE outside of therapy now.  Pt tolerated exercises well.  Will continue to address and progress towards LTG.   Rehab Potential Good   Clinical Impairments Affecting Rehab Potential previous history, cognitive deficits-poor carryover   PT Frequency 2x / week   PT Duration 4 weeks   PT Treatment/Interventions ADLs/Self Care Home Management;Electrical Stimulation;DME Instruction;Gait  training;Stair training;Functional mobility training;Therapeutic activities;Therapeutic exercise;Balance training;Neuromuscular re-education;Cognitive remediation;Patient/family education;Orthotic  Fit/Training;Passive range of motion   PT Next Visit Plan STG due! last session: provided pt and daughter with more stretches and strengthening exercises for HEP; may need to review and add more strengthening exercises.  Daughter will take exercises with them to the beach   Consulted and Agree with Plan of Care Patient;Family member/caregiver      Patient will benefit from skilled therapeutic intervention in order to improve the following deficits and impairments:  Abnormal gait, Decreased activity tolerance, Decreased balance, Decreased cognition, Decreased coordination, Decreased endurance, Decreased knowledge of use of DME, Decreased mobility, Decreased range of motion, Decreased strength, Increased muscle spasms, Impaired perceived functional ability, Impaired flexibility, Impaired sensation, Impaired tone, Postural dysfunction  Visit Diagnosis: Other abnormalities of gait and mobility  Unsteadiness on feet  Muscle weakness (generalized)  Other disturbances of skin sensation  Spastic hemiplegia of left nondominant side as late effect of cerebral infarction [facility])     Problem List Patient Active Problem List   Diagnosis Date Noted  . Somnolence, daytime   . Acute ischemic left MCA stroke (HCC) 08/06/2016  . Acute urinary retention 08/06/2016  . Aphasia   . Depression 02/03/2016  . Insomnia 02/03/2016  . Chronic musculoskeletal pain 02/03/2016  . GERD (gastroesophageal reflux disease) 12/31/2015  . Pressure ulcer 12/23/2015  . Atrial fibrillation (HCC) 06/06/2015  . Embolic stroke (HCC) 03/22/2015  . Left hemiparesis (HCC)   . CVA (cerebral vascular accident) (HCC)   . H/O TIA (transient ischemic attack) and stroke   . Spinal stenosis, lumbar region, with neurogenic claudication 01/17/2015  . Lumbar stenosis 01/17/2015  . 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 Essential hypertension 11/10/2014  . HLD (hyperlipidemia) 11/10/2014  . Chronic back pain 11/08/2014    . Cerebral infarction due to thrombosis of cerebral artery (HCC)   . Hyperlipidemia LDL goal <70 09/07/2014  . Presumed CVA (cerebral infarction) s/p IV tPA, MRI negative 09/05/2014  . Hypertensive urgency 09/05/2014  . Right bundle branch block 03/21/2014  . Cough 10/31/2012  . Elevated lipase 10/30/2012  . HTN (hypertension) 10/30/2012  . Hypothyroidism 10/30/2012   Edman Circle, PT, DPT 12/31/16    11:34 AM    [facility] [address] [place], [place], [address] Phone: [phone]   Fax:  [phone]  Name: Angela Keith MRN: [id number] Date of Birth: Nov 12, 1929

## 2016-12-31 NOTE — Patient Instructions (Addendum)
CAREGIVER ASSISTED: Hamstrings - Supine    Caregiver holds leg at ankle and over knee; raises leg straight. Hold __30_ seconds. _2__ reps per set.  1x/day.   Copyright  VHI. All rights reserved.   CAREGIVER ASSISTED: Ankle Dorsiflexion    Caregiver cups hand under heel, rests foot on forearm; leans forward to move foot toward head. Hold _30__ seconds. __2_ reps per set.  1x/day.Adduction / Internal Rotation: ROM (Supine)    Position (A) Patient: Bend left leg, foot flat on bed. Helper: Stabilize left hip. Place other hand on knee. Motion (B) -Push knee toward opposite side of body. -Stop at point of tension, hold about 30 seconds Repeat _2__ times. Do _2__ sessions per day.   Copyright  VHI. All rights reserved.  Abduction / External Rotation: ROM (Supine)    Position (A) Patient: Bend left leg, foot flat on bed. Helper: Stabilize opposite hip. Place other hand on knee. Motion (B) -Move knee out to side, pelvis remains flat on bed. -Stop at point of tension in inner thigh, hold 30 seconds. Repeat _2__ times. Do __2_ sessions per day.   Hip Flexor Stretch    Lying on back near edge of bed, bend right leg, foot flat. Hang left leg over edge, relaxed, thigh resting entirely on bed for __1-2__ minutes. Repeat _2___ times. Do __2__ sessions per day.  Abduction: ROM (Supine)    Position (A) Helper: Support left leg, keeping knee straight. Stabilize other leg, if necessary, to prevent it from moving. Motion (B) - Glide leg out to side, keeping toes pointing toward ceiling with Fleet Contrasachel helping. - Stop at point of tension in inner thigh. Repeat _10__ times. Do _2__ sessions per day.   Bridge    Lying on back, legs bent 90, feet flat on bed. Squeeze your butt first, lift left hip first!  Hold for 3-5 seconds

## 2017-01-02 DIAGNOSIS — R350 Frequency of micturition: Secondary | ICD-10-CM | POA: Diagnosis not present

## 2017-01-03 ENCOUNTER — Encounter: Payer: Self-pay | Admitting: Rehabilitation

## 2017-01-03 ENCOUNTER — Ambulatory Visit: Payer: Medicare Other | Admitting: Rehabilitation

## 2017-01-03 DIAGNOSIS — I69354 Hemiplegia and hemiparesis following cerebral infarction affecting left non-dominant side: Secondary | ICD-10-CM

## 2017-01-03 DIAGNOSIS — R208 Other disturbances of skin sensation: Secondary | ICD-10-CM | POA: Diagnosis not present

## 2017-01-03 DIAGNOSIS — R2689 Other abnormalities of gait and mobility: Secondary | ICD-10-CM

## 2017-01-03 DIAGNOSIS — M6281 Muscle weakness (generalized): Secondary | ICD-10-CM

## 2017-01-03 DIAGNOSIS — R2681 Unsteadiness on feet: Secondary | ICD-10-CM | POA: Diagnosis not present

## 2017-01-03 NOTE — Therapy (Signed)
[facility] [address] [place], [place], [address] Phone: [phone]   Fax:  [phone]  Physical Therapy Treatment  Patient Details  Name: Angela Keith MRN: [id number] Date of Birth: 1929-09-04 Referring Provider: Josetta Huddle, MD  Encounter Date: 01/03/2017      PT End of Session - 01/03/17 5625    Visit Number 8   Number of Visits 13   Date for PT Re-Evaluation 02/03/17   Authorization Type MCR G Code on every 10th visit   PT Start Time 1140   PT Stop Time 1230   PT Time Calculation (min) 50 min   Activity Tolerance No increased pain   Behavior During Therapy [facility] for tasks assessed/performed      Past Medical History:  Diagnosis Date  . Arthritis   . Atrial fibrillation ([place])   . Chronic neck pain   . DJD (degenerative joint disease), cervical   . DJD (degenerative joint disease), lumbar   . Dysphagia   . GERD (gastroesophageal reflux disease)   . Glaucoma   . HTN (hypertension) 10/30/2012  . Hyperlipidemia   . Hypothyroidism   . Insomnia   . Nocturia   . Right bundle branch block 03/21/2014  . Rotoscoliosis   . Severe hearing loss   . Shortness of breath dyspnea   . Stroke ([place])   . Unspecified hypothyroidism 10/30/2012    Past Surgical History:  Procedure Laterality Date  . BACK SURGERY     lower back   . bladder laser surgery    . BREAST SURGERY    . cataract surgery    . EP IMPLANTABLE DEVICE N/A 01/19/2015   Procedure: Loop Recorder Insertion;  Surgeon: Evans Lance, MD;  Location: [place] [facility];  Service: Cardiovascular;  Laterality: N/A;  . ESOPHAGOGASTRODUODENOSCOPY (EGD) WITH ESOPHAGEAL DILATION  06/08/2012   Procedure: ESOPHAGOGASTRODUODENOSCOPY (EGD) WITH ESOPHAGEAL DILATION;  Surgeon: Garlan Fair, MD;  Location: [facility];  Service: Endoscopy;  Laterality: N/A;  . facail surgery  2000   facelift and eyelid lift  . left carpal tunnel surgery    . LUMBAR  LAMINECTOMY/DECOMPRESSION MICRODISCECTOMY N/A 01/17/2015   Procedure: Laminectomy and Foraminotomy - L2-L3 - L3-L4;  Surgeon: Earnie Larsson, MD;  Location: [place] [facility];  Service: Neurosurgery;  Laterality: N/A;  Laminectomy and Foraminotomy - L2-L3 - L3-L4  . TEE WITHOUT CARDIOVERSION N/A 01/19/2015   Procedure: TRANSESOPHAGEAL ECHOCARDIOGRAM (TEE);  Surgeon: Pixie Casino, MD;  Location: [facility] ENDOSCOPY;  Service: Cardiovascular;  Laterality: N/A;    There were no vitals filed for this visit.      Subjective Assessment - 01/03/17 1143    Subjective Reports still not doing the standing due to pt being fearful.  No other changes.    Patient is accompained by: Family member  Deena   Pertinent History CVA x 4 in history, HOH    Limitations House hold activities;Walking   Patient Stated Goals "I want to walk with a walker."    Currently in Pain? No/denies                         [facility] Adult PT Treatment/Exercise - 01/03/17 0001      Transfers   Transfers Sit to Stand;Stand to Sit;Squat Pivot Transfers   Sit to Stand 4: Min assist   Sit to Stand Details Verbal cues for sequencing;Verbal cues for technique;Verbal cues for precautions/safety;Manual facilitation for weight shifting   Sit to  Stand Details (indicate cue type and reason) Continues to require max verbal and demo cues to increase forward trunk lean when sitting and standing.    Stand to Sit 4: Min assist   Stand to Sit Details (indicate cue type and reason) Verbal cues for sequencing;Verbal cues for technique;Verbal cues for precautions/safety;Manual facilitation for weight shifting   Stand to Sit Details Continued cues and facilitation for forward trunk lean.    Squat Pivot Transfers 4: Min assist;With upper extremity assistance   Squat Pivot Transfer Details (indicate cue type and reason) Cues for hand placement to increase forward weight shift.  Performed x 7 reps during session as blocked practice to increase  carryover for forward trunk lean and weight shift.  Pt did show improvement at end of session following forward reach exercise.       Ambulation/Gait   Ambulation/Gait Yes   Ambulation/Gait Assistance 4: Min assist   Ambulation/Gait Assistance Details Pt did very well with gait today vs previous sessions with this PT.  Ambulated 29' with RW at min A level with cues for increased R step length.  This made a marked difference in her gait with improved quality and L LE activation.     Ambulation Distance (Feet) 29 Feet   Assistive device Rolling walker   Gait Pattern Step-to pattern;Decreased step length - right;Decreased stance time - left;Decreased stride length;Decreased dorsiflexion - left;Decreased weight shift to left;Left flexed knee in stance;Trunk flexed;Poor foot clearance - left   Ambulation Surface Level;Indoor     Balance   Balance Assessed Yes     Dynamic Standing Balance   Dynamic Standing - Balance Support Right upper extremity supported;During functional activity   Dynamic Standing - Comments Had pt stand to play card game of "war" to address standing balance goal.  Pt able to stand x 5.30 mins with single UE support with min A (at pelvis and with PTs LE in front of her LLE to promote weight shift and LLE WB).  Tolerated very well.      Neuro Re-ed    Neuro Re-ed Details  Performed seated forward reach from w/c reaching across therapy mat.  Provided cues for forward reach and then reaching forward but to the L as she tends to lean to the R.  Attempted on several trials to have pt lift/elevate buttocks slightly, however this was difficult in that she did not fully utilize LE strength.  will continue to practice.                  PT Education - 01/03/17 1243    Education provided Yes   Education Details reaching forward onto bed or table to increase comfort with forward reach.    Person(s) Educated Patient;Child(ren)   Methods Explanation;Demonstration   Comprehension  Verbalized understanding;Returned demonstration          PT Short Term Goals - 01/03/17 1144      PT SHORT TERM GOAL #1   Title Pt/caregiver will be independent with initial HEP in order to indicate improved functional mobility and decreased fall risk.  (Target Date: 01/04/17)   Baseline did not re-assess as she was given many exercises just last visit   Status On-going     PT SHORT TERM GOAL #2   Title Will assess 5TSS and improve time by 5 secs in order to indicate improved functional strength.     Baseline 28.81 seconds with mod-max A of therapist to perform safely, 68.82 secs with BUEs and min  A to RW   Status Not Met     PT SHORT TERM GOAL #3   Title Pt will perform squat pivot transfers to the R or L consistently at min A level in order to increase independence with functional transfers.    Baseline min A either direction 01/03/17   Status Achieved     PT SHORT TERM GOAL #4   Title Pt will perform dynamic standing balance at counter top with single UE support x 3 mins at min/guard level in order to improve independence with ADLs.    Baseline stood for 5.30 mins with min A on 01/03/17   Status Partially Met     PT SHORT TERM GOAL #5   Title Pt will ambulate x 25' w/ LRAD at min A level over indoor surfaces in order to increase functional mobility at home.    Baseline min A with RW x 29' 01/03/17   Status Achieved           PT Long Term Goals - 12/05/16 1930      PT LONG TERM GOAL #1   Title Pt/caregiver will be independent with final HEP in order to indicate improved functional mobility and decreased fall risk.  (Target Date: 02/03/17)   Status New     PT LONG TERM GOAL #2   Title Pt will improve 5TSS by 10 secs from baseline in order to indicate improved functional strength.     Status New     PT LONG TERM GOAL #3   Title Pt will perform stand pivot transfers w/ LRAD at S level in order to indicate improved functional independence.     Status New     PT LONG TERM  GOAL #4   Title Pt will perform dynamic standing balance with single UE support (reaching upwards and laterally with opposite UE) x 5 mins at S level in order to indicate increased independence with ADLs.    Status New     PT LONG TERM GOAL #5   Title Pt will ambulate 54' w/ LRAD over indoor surfaces at S level in order to indicate improved functional independence.     Status New               Plan - 01/03/17 1245    Clinical Impression Statement Skilled session focused on addressing STGs.  Did not re-assess HEP as most of it was given at last session, will address in next session.  Otherwise, pt has met 2/5 STGs, partially meeting 3rd goal for standing balance.  Note that they have not had a lot of opportunity to have her increase standing and gait at home due to safety concerns as well as pt being more fearful with family to assist.  They are going to try again at the beach this weekend as there will be more family to assist.  Pt making slow but steady progress towards LTGs.    Rehab Potential Good   Clinical Impairments Affecting Rehab Potential previous history, cognitive deficits-poor carryover   PT Frequency 2x / week   PT Duration 4 weeks   PT Treatment/Interventions ADLs/Self Care Home Management;Electrical Stimulation;DME Instruction;Gait training;Stair training;Functional mobility training;Therapeutic activities;Therapeutic exercise;Balance training;Neuromuscular re-education;Cognitive remediation;Patient/family education;Orthotic Fit/Training;Passive range of motion   PT Next Visit Plan Go over HEP if needed and address in goal section, forward reach on mat or table, if can progress to her lifting buttocks (I was unable to get her to do this in last session fully).  LLE  forced use.    Consulted and Agree with Plan of Care Patient;Family member/caregiver   Family Member Consulted daughter in law Dena      Patient will benefit from skilled therapeutic intervention in order to  improve the following deficits and impairments:  Abnormal gait, Decreased activity tolerance, Decreased balance, Decreased cognition, Decreased coordination, Decreased endurance, Decreased knowledge of use of DME, Decreased mobility, Decreased range of motion, Decreased strength, Increased muscle spasms, Impaired perceived functional ability, Impaired flexibility, Impaired sensation, Impaired tone, Postural dysfunction  Visit Diagnosis: Other abnormalities of gait and mobility  Unsteadiness on feet  Muscle weakness (generalized)  Spastic hemiplegia of left nondominant side as late effect of cerebral infarction [facility])     Problem List Patient Active Problem List   Diagnosis Date Noted  . Somnolence, daytime   . Acute ischemic left MCA stroke ([place]) 08/06/2016  . Acute urinary retention 08/06/2016  . Aphasia   . Depression 02/03/2016  . Insomnia 02/03/2016  . Chronic musculoskeletal pain 02/03/2016  . GERD (gastroesophageal reflux disease) 12/31/2015  . Pressure ulcer 12/23/2015  . Atrial fibrillation ([place]) 06/06/2015  . Embolic stroke ([place]) 10/62/6948  . Left hemiparesis ([place])   . CVA (cerebral vascular accident) ([place])   . H/O TIA (transient ischemic attack) and stroke   . Spinal stenosis, lumbar region, with neurogenic claudication 01/17/2015  . Lumbar stenosis 01/17/2015  . 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 Essential hypertension 11/10/2014  . HLD (hyperlipidemia) 11/10/2014  . Chronic back pain 11/08/2014  . Cerebral infarction due to thrombosis of cerebral artery ([place])   . Hyperlipidemia LDL goal <70 09/07/2014  . Presumed CVA (cerebral infarction) s/p IV tPA, MRI negative 09/05/2014  . Hypertensive urgency 09/05/2014  . Right bundle branch block 03/21/2014  . Cough 10/31/2012  . Elevated lipase 10/30/2012  . HTN (hypertension) 10/30/2012  . Hypothyroidism 10/30/2012    Cameron Sprang, PT, MPT [facility] [address] [place], [place], [address] Phone: [phone]   Fax:  [phone] 01/03/17, 1:05 PM  Name: Angela Keith MRN: [id number] Date of Birth: 26-Sep-1929

## 2017-01-09 ENCOUNTER — Ambulatory Visit (INDEPENDENT_AMBULATORY_CARE_PROVIDER_SITE_OTHER): Payer: Medicare Other | Admitting: *Deleted

## 2017-01-09 DIAGNOSIS — I639 Cerebral infarction, unspecified: Secondary | ICD-10-CM | POA: Diagnosis not present

## 2017-01-10 NOTE — Progress Notes (Signed)
Carelink Summary Report / Loop Recorder 

## 2017-01-13 ENCOUNTER — Other Ambulatory Visit: Payer: Self-pay | Admitting: Internal Medicine

## 2017-01-13 ENCOUNTER — Ambulatory Visit: Payer: Medicare Other | Attending: Internal Medicine | Admitting: Rehabilitation

## 2017-01-13 ENCOUNTER — Encounter: Payer: Self-pay | Admitting: Rehabilitation

## 2017-01-13 DIAGNOSIS — R2689 Other abnormalities of gait and mobility: Secondary | ICD-10-CM | POA: Diagnosis not present

## 2017-01-13 DIAGNOSIS — M6281 Muscle weakness (generalized): Secondary | ICD-10-CM | POA: Diagnosis not present

## 2017-01-13 DIAGNOSIS — R2681 Unsteadiness on feet: Secondary | ICD-10-CM

## 2017-01-13 DIAGNOSIS — I69354 Hemiplegia and hemiparesis following cerebral infarction affecting left non-dominant side: Secondary | ICD-10-CM | POA: Diagnosis not present

## 2017-01-13 NOTE — Therapy (Signed)
[facility] [address] [place], [place], [address] Phone: [phone]   Fax:  [phone]  Physical Therapy Treatment  Patient Details  Name: Angela Keith MRN: [id number] Date of Birth: 1929-08-25 Referring Provider: Josetta Huddle, MD  Encounter Date: 01/13/2017      PT End of Session - 01/13/17 1108    Visit Number 9   Number of Visits 13   Date for PT Re-Evaluation 02/03/17   Authorization Type MCR G Code on every 10th visit   PT Start Time 1103   PT Stop Time 1148   PT Time Calculation (min) 45 min   Activity Tolerance No increased pain   Behavior During Therapy [facility] for tasks assessed/performed      Past Medical History:  Diagnosis Date  . Arthritis   . Atrial fibrillation ([place])   . Chronic neck pain   . DJD (degenerative joint disease), cervical   . DJD (degenerative joint disease), lumbar   . Dysphagia   . GERD (gastroesophageal reflux disease)   . Glaucoma   . HTN (hypertension) 10/30/2012  . Hyperlipidemia   . Hypothyroidism   . Insomnia   . Nocturia   . Right bundle branch block 03/21/2014  . Rotoscoliosis   . Severe hearing loss   . Shortness of breath dyspnea   . Stroke ([place])   . Unspecified hypothyroidism 10/30/2012    Past Surgical History:  Procedure Laterality Date  . BACK SURGERY     lower back   . bladder laser surgery    . BREAST SURGERY    . cataract surgery    . EP IMPLANTABLE DEVICE N/A 01/19/2015   Procedure: Loop Recorder Insertion;  Surgeon: Evans Lance, MD;  Location: [place] CV LAB;  Service: Cardiovascular;  Laterality: N/A;  . ESOPHAGOGASTRODUODENOSCOPY (EGD) WITH ESOPHAGEAL DILATION  06/08/2012   Procedure: ESOPHAGOGASTRODUODENOSCOPY (EGD) WITH ESOPHAGEAL DILATION;  Surgeon: Garlan Fair, MD;  Location: [facility];  Service: Endoscopy;  Laterality: N/A;  . facail surgery  2000   facelift and eyelid lift  . left carpal tunnel surgery    . LUMBAR  LAMINECTOMY/DECOMPRESSION MICRODISCECTOMY N/A 01/17/2015   Procedure: Laminectomy and Foraminotomy - L2-L3 - L3-L4;  Surgeon: Earnie Larsson, MD;  Location: [place] [facility]UR[facility] ORS;  Service: Neurosurgery;  Laterality: N/A;  Laminectomy and Foraminotomy - L2-L3 - L3-L4  . TEE WITHOUT CARDIOVERSION N/A 01/19/2015   Procedure: TRANSESOPHAGEAL ECHOCARDIOGRAM (TEE);  Surgeon: Pixie Casino, MD;  Location: [facility] ENDOSCOPY;  Service: Cardiovascular;  Laterality: N/A;    There were no vitals filed for this visit.      Subjective Assessment - 01/13/17 1107    Subjective Reports beach trip went well, however they did not get to walk because they forgot to bring RW    Patient is accompained by: Family member   Pertinent History CVA x 4 in history, HOH    Limitations House hold activities;Walking   Patient Stated Goals "I want to walk with a walker."    Currently in Pain? No/denies                         [facility] Adult PT Treatment/Exercise - 01/13/17 0001      Transfers   Transfers Sit to Stand;Stand to Sit;Squat Pivot Transfers   Sit to Stand 4: Min assist;3: Mod assist   Sit to Stand Details Verbal cues for sequencing;Verbal cues for technique;Verbal cues for precautions/safety;Manual facilitation for weight shifting;Manual facilitation  for weight bearing   Sit to Stand Details (indicate cue type and reason) Following NMR tasks for forward weight shift, worked on sit<>stand from RW x 6-8 reps during session with cues for forward trunk lean and weight shift and facilitation for forward weight shift and WB through LLE.     Stand to Sit 4: Min assist   Stand to Sit Details (indicate cue type and reason) Verbal cues for sequencing;Verbal cues for technique;Verbal cues for precautions/safety;Manual facilitation for weight shifting;Manual facilitation for weight bearing   Stand to Sit Details Heavy facilitation for forward trunk lean.    Squat Pivot Transfers 4: Min guard;4: Min Tree surgeon Details (indicate cue type and reason) Worked on blocked practice of squat pivot transfers following NMR task for carryover of forward trunk lean and improved WB through LEs.  Performed x 4 reps with min A fading to min/guard with marked improvement noted with forward trunk lean with proper hand placement and assist for foot placement.       Ambulation/Gait   Ambulation/Gait Yes   Ambulation/Gait Assistance 4: Min assist;3: Mod assist   Ambulation/Gait Assistance Details Gait x 25' towards stairs today with RW at min/mod A level.  Continue to note improvement with ability to WB on LLE when cued to increase R step length.  Also cues for forward gaze which is difficult as pt constantly wants to turn to hear what you are saying.    Ambulation Distance (Feet) 25 Feet   Assistive device Rolling walker   Gait Pattern Step-to pattern;Decreased step length - right;Decreased stance time - left;Decreased stride length;Decreased dorsiflexion - left;Decreased weight shift to left;Left flexed knee in stance;Trunk flexed;Poor foot clearance - left   Ambulation Surface Level;Indoor     Neuro Re-ed    Neuro Re-ed Details  NMR in seated position on mat with hands on physioball rolling ball forwards/back towards her for increased trunk flexion x 10 reps progressing to forward rolling with elevation of buttocks/WB through LEs.  Note pt better able to perform this task during this session vs last.  Progressed to pt forward leaning, elevating buttocks and scooting side to side x 5 reps.  Then performed squat pivots as above.  Standing NMR with RW having pt tap RLE to 2" step and back. and then holding R LE onto step.  Note good activation when stepping, however when asked to hold position, LLE would buckle and she would need assist to sit safely.  Utilized Geologist, engineering for improved feedback.  ended session with stair training for NMR to get improved postural control, improved L lateral weight shift and L LE WB.  Pt did  well ascending stairs, however requires max A to descend and note she demos posterior trunk lean and heavy reliance on UEs to avoid WB on LLE>  Also worked in standing having pt stand at bottom of stairs and tap RLE to/from first step.  Pt requires max facilitation, but was able to activate LLE.                  PT Education - 01/13/17 1309    Education provided Yes   Education Details beginning to walk short distances at home.    Person(s) Educated Patient;Child(ren)   Methods Explanation   Comprehension Verbalized understanding          PT Short Term Goals - 01/03/17 1144      PT SHORT TERM GOAL #1   Title Pt/caregiver will be  independent with initial HEP in order to indicate improved functional mobility and decreased fall risk.  (Target Date: 01/04/17)   Baseline did not re-assess as she was given many exercises just last visit   Status On-going     PT SHORT TERM GOAL #2   Title Will assess 5TSS and improve time by 5 secs in order to indicate improved functional strength.     Baseline 28.81 seconds with mod-max A of therapist to perform safely, 68.82 secs with BUEs and min A to RW   Status Not Met     PT SHORT TERM GOAL #3   Title Pt will perform squat pivot transfers to the R or L consistently at min A level in order to increase independence with functional transfers.    Baseline min A either direction 01/03/17   Status Achieved     PT SHORT TERM GOAL #4   Title Pt will perform dynamic standing balance at counter top with single UE support x 3 mins at min/guard level in order to improve independence with ADLs.    Baseline stood for 5.30 mins with min A on 01/03/17   Status Partially Met     PT SHORT TERM GOAL #5   Title Pt will ambulate x 25' w/ LRAD at min A level over indoor surfaces in order to increase functional mobility at home.    Baseline min A with RW x 29' 01/03/17   Status Achieved           PT Long Term Goals - 12/05/16 1930      PT LONG TERM GOAL  #1   Title Pt/caregiver will be independent with final HEP in order to indicate improved functional mobility and decreased fall risk.  (Target Date: 02/03/17)   Status New     PT LONG TERM GOAL #2   Title Pt will improve 5TSS by 10 secs from baseline in order to indicate improved functional strength.     Status New     PT LONG TERM GOAL #3   Title Pt will perform stand pivot transfers w/ LRAD at S level in order to indicate improved functional independence.     Status New     PT LONG TERM GOAL #4   Title Pt will perform dynamic standing balance with single UE support (reaching upwards and laterally with opposite UE) x 5 mins at S level in order to indicate increased independence with ADLs.    Status New     PT LONG TERM GOAL #5   Title Pt will ambulate 78' w/ LRAD over indoor surfaces at S level in order to indicate improved functional independence.     Status New               Plan - 01/13/17 1309    Clinical Impression Statement Skilled session focused on NMR for improved postural control, improved forward trunk lean, improved L lateral weight shift and LLE WB.  tolerated well.  Recommend they begin walking at home for very short distances to chair from chair.     Rehab Potential Good   Clinical Impairments Affecting Rehab Potential previous history, cognitive deficits-poor carryover   PT Frequency 2x / week   PT Duration 4 weeks   PT Treatment/Interventions ADLs/Self Care Home Management;Electrical Stimulation;DME Instruction;Gait training;Stair training;Functional mobility training;Therapeutic activities;Therapeutic exercise;Balance training;Neuromuscular re-education;Cognitive remediation;Patient/family education;Orthotic Fit/Training;Passive range of motion   PT Next Visit Plan GCODE/PN!! Go over HEP if needed and address in goal section, forward reach on  mat or table, if can progress to her lifting buttocks (I was unable to get her to do this in last session fully).  LLE  forced use.    Consulted and Agree with Plan of Care Patient;Family member/caregiver   Family Member Consulted daughter in law Dena      Patient will benefit from skilled therapeutic intervention in order to improve the following deficits and impairments:  Abnormal gait, Decreased activity tolerance, Decreased balance, Decreased cognition, Decreased coordination, Decreased endurance, Decreased knowledge of use of DME, Decreased mobility, Decreased range of motion, Decreased strength, Increased muscle spasms, Impaired perceived functional ability, Impaired flexibility, Impaired sensation, Impaired tone, Postural dysfunction  Visit Diagnosis: Other abnormalities of gait and mobility  Unsteadiness on feet  Muscle weakness (generalized)  Spastic hemiplegia of left nondominant side as late effect of cerebral infarction [facility])     Problem List Patient Active Problem List   Diagnosis Date Noted  . Somnolence, daytime   . Acute ischemic left MCA stroke ([place]) 08/06/2016  . Acute urinary retention 08/06/2016  . Aphasia   . Depression 02/03/2016  . Insomnia 02/03/2016  . Chronic musculoskeletal pain 02/03/2016  . GERD (gastroesophageal reflux disease) 12/31/2015  . Pressure ulcer 12/23/2015  . Atrial fibrillation ([place]) 06/06/2015  . Embolic stroke ([place]) 76/16/0737  . Left hemiparesis ([place])   . CVA (cerebral vascular accident) ([place])   . H/O TIA (transient ischemic attack) and stroke   . Spinal stenosis, lumbar region, with neurogenic claudication 01/17/2015  . Lumbar stenosis 01/17/2015  . 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 Essential hypertension 11/10/2014  . HLD (hyperlipidemia) 11/10/2014  . Chronic back pain 11/08/2014  . Cerebral infarction due to thrombosis of cerebral artery ([place])   . Hyperlipidemia LDL goal <70 09/07/2014  . Presumed CVA (cerebral infarction) s/p IV tPA, MRI negative 09/05/2014  . Hypertensive urgency 09/05/2014  . Right bundle branch block  03/21/2014  . Cough 10/31/2012  . Elevated lipase 10/30/2012  . HTN (hypertension) 10/30/2012  . Hypothyroidism 10/30/2012    Cameron Sprang, PT, MPT [facility] [address] [place], [place], [address] Phone: [phone]   Fax:  [phone] 01/13/17, 1:12 PM   Name: Angela Keith MRN: [id number] Date of Birth: 1929-09-23

## 2017-01-14 ENCOUNTER — Ambulatory Visit (INDEPENDENT_AMBULATORY_CARE_PROVIDER_SITE_OTHER): Payer: Medicare Other | Admitting: Internal Medicine

## 2017-01-14 ENCOUNTER — Encounter: Payer: Self-pay | Admitting: Internal Medicine

## 2017-01-14 VITALS — BP 164/84 | HR 60 | Ht 64.0 in | Wt 120.4 lb

## 2017-01-14 DIAGNOSIS — I4510 Unspecified right bundle-branch block: Secondary | ICD-10-CM

## 2017-01-14 DIAGNOSIS — I639 Cerebral infarction, unspecified: Secondary | ICD-10-CM

## 2017-01-14 LAB — CUP PACEART INCLINIC DEVICE CHECK
Implantable Pulse Generator Implant Date: 20160714
MDC IDC SESS DTM: 20180710113705

## 2017-01-14 MED ORDER — NITROGLYCERIN 0.4 MG SL SUBL: SUBLINGUAL_TABLET | SUBLINGUAL | 3 refills | Status: DC

## 2017-01-14 NOTE — Patient Instructions (Signed)
Medication Instructions:  Your physician has recommended you make the following change in your medication:  START Nitroglycerin 0.4 mg under the tongue as needed for chest pressure    Labwork: None Ordered   Testing/Procedures: None Ordered   Follow-Up: Your physician wants you to follow-up in: 1 year with Dr. Ladona Ridgelaylor. You will receive a reminder letter in the mail two months in advance. If you don't receive a letter, please call our office to schedule the follow-up appointment.  LINQ: Monthly summary reports  Any Other Special Instructions Will Be Listed Below (If Applicable).     If you need a refill on your cardiac medications before your next appointment, please call your pharmacy.

## 2017-01-14 NOTE — Progress Notes (Signed)
HPI Ms. Newberry returns today for followup. She is a pleasant 83 yo woman with multiple medical problems who sustained a stroke, had an ILR inserted and then on cardiac monitoring developed atrial fib with a CVR/RVR for which she was asymptomatic. In the interim, she has sustained another stroke, despite being on her Eliquis. She had her Eliquis dose reduced and has also been placed on ASA 81 mg daily. She denies sob but did experience an episode of chest pressure. She does not take NTG.  Allergies  Allergen Reactions  . Ace Inhibitors Other (See Comments)    Severe fatigue  . Lipitor [Atorvastatin] Other (See Comments)    Mood swings  . Motrin [Ibuprofen] Nausea And Vomiting     Current Outpatient Prescriptions  Medication Sig Dispense Refill  . acetaminophen (TYLENOL) 325 MG tablet Take 650 mg by mouth every 6 (six) hours as needed for mild pain or moderate pain.    Marland Kitchen. apixaban (ELIQUIS) 5 MG TABS tablet Take 2.5 mg by mouth 2 (two) times daily.    Marland Kitchen. aspirin EC 81 MG EC tablet Take 1 tablet (81 mg total) by mouth daily. 30 tablet 0  . baclofen (LIORESAL) 10 MG tablet Take 5 mg by mouth 2 (two) times daily as needed for muscle spasms.    . dorzolamide-timolol (COSOPT) 22.3-6.8 MG/ML ophthalmic solution Place 1 drop into both eyes 2 (two) times daily.    Marland Kitchen. gabapentin (NEURONTIN) 100 MG capsule Take 100 mg by mouth 2 (two) times daily. At breakfast and lunch     . HYDROcodone-acetaminophen (NORCO/VICODIN) 5-325 MG tablet Take 1 tablet by mouth 2 (two) times daily as needed for moderate pain or severe pain. 2 tablet 0  . latanoprost (XALATAN) 0.005 % ophthalmic solution Place 1 drop into both eyes at bedtime.    Marland Kitchen. levothyroxine (SYNTHROID, LEVOTHROID) 88 MCG tablet Take 88 mcg by mouth daily before breakfast.    . lidocaine (LIDODERM) 5 % Place 1 patch onto the skin daily. Left lateral thigh  Remove & Discard patch within 12 hours or as directed by MD    . metoprolol tartrate (LOPRESSOR)  25 MG tablet Take 25 mg by mouth 2 (two) times daily.    . mirtazapine (REMERON) 7.5 MG tablet Take 15 mg by mouth at bedtime.     Marland Kitchen. NUTRITIONAL SUPPLEMENTS PO Take Medpass 4 oz by mouth 2 times daily to support nutritional needs    . pantoprazole (PROTONIX) 40 MG tablet Take 40 mg by mouth 2 (two) times daily.     . pravastatin (PRAVACHOL) 20 MG tablet Take 1 tablet (20 mg total) by mouth daily at 6 PM. 30 tablet 0  . QUEtiapine (SEROQUEL) 25 MG tablet Take 50 mg by mouth at bedtime.     . tamsulosin (FLOMAX) 0.4 MG CAPS capsule TAKE 1 CAPSULE BY MOUTH ONCE DAILY 30 capsule 0  . vitamin B-12 (CYANOCOBALAMIN) 1000 MCG tablet Take 1,000 mcg by mouth daily.      No current facility-administered medications for this visit.      Past Medical History:  Diagnosis Date  . Arthritis   . Atrial fibrillation (HCC)   . Chronic neck pain   . DJD (degenerative joint disease), cervical   . DJD (degenerative joint disease), lumbar   . Dysphagia   . GERD (gastroesophageal reflux disease)   . Glaucoma   . HTN (hypertension) 10/30/2012  . Hyperlipidemia   . Hypothyroidism   . Insomnia   .  Nocturia   . Right bundle branch block 03/21/2014  . Rotoscoliosis   . Severe hearing loss   . Shortness of breath dyspnea   . Stroke (HCC)   . Unspecified hypothyroidism 10/30/2012    ROS:   All systems reviewed and negative except as noted in the HPI.   Past Surgical History:  Procedure Laterality Date  . BACK SURGERY     lower back   . bladder laser surgery    . BREAST SURGERY    . cataract surgery    . EP IMPLANTABLE DEVICE N/A 01/19/2015   Procedure: Loop Recorder Insertion;  Surgeon: Marinus Maw, MD;  Location: [facility] INVASIVE CV LAB;  Service: Cardiovascular;  Laterality: N/A;  . ESOPHAGOGASTRODUODENOSCOPY (EGD) WITH ESOPHAGEAL DILATION  06/08/2012   Procedure: ESOPHAGOGASTRODUODENOSCOPY (EGD) WITH ESOPHAGEAL DILATION;  Surgeon: Charolett Bumpers, MD;  Location: [facility] ENDOSCOPY;  Service: Endoscopy;   Laterality: N/A;  . facail surgery  2000   facelift and eyelid lift  . left carpal tunnel surgery    . LUMBAR LAMINECTOMY/DECOMPRESSION MICRODISCECTOMY N/A 01/17/2015   Procedure: Laminectomy and Foraminotomy - L2-L3 - L3-L4;  Surgeon: [name], MD;  Location: [facility] NEURO ORS;  Service: Neurosurgery;  Laterality: N/A;  Laminectomy and Foraminotomy - L2-L3 - L3-L4  . TEE WITHOUT CARDIOVERSION N/A 01/19/2015   Procedure: TRANSESOPHAGEAL ECHOCARDIOGRAM (TEE);  Surgeon: Chrystie Nose, MD;  Location: [facility] ENDOSCOPY;  Service: Cardiovascular;  Laterality: N/A;     Family History  Problem Relation Age of Onset  . CAD Father   . Stroke Father   . Diabetes Mother   . Lung cancer Other   . CAD Brother      Social History   Social History  . Marital status: Widowed    Spouse name: N/A  . Number of children: 2  . Years of education: 10   Occupational History  . retired     Veterinary surgeon, office work   Social History Main Topics  . Smoking status: Never Smoker  . Smokeless tobacco: Never Used  . Alcohol use No  . Drug use: No  . Sexual activity: No   Other Topics Concern  . Not on file   Social History Narrative   Widowed, 2 sons   Right handed   caffeine use- none     BP (!) 164/84   Pulse 60   Ht 5\' 4"  (1.626 m)   Wt 120 lb 6.4 oz (54.6 kg)   SpO2 95%   BMI 20.67 kg/m   Physical Exam:  Well appearing 83 yo man, NAD HEENT: Unremarkable Neck:  6 cm JVD, no thyromegally Lymphatics:  No adenopathy Back:  No CVA tenderness Lungs:  Clear with no wheezes HEART:  Regular rate rhythm, no murmurs, no rubs, no clicks Abd:  soft, positive bowel sounds, no organomegally, no rebound, no guarding Ext:  2 plus pulses, no edema, no cyanosis, no clubbing Skin:  No rashes no nodules Neuro:  CN II through XII intact, motor grossly intact  DEVICE  Normal device function.    Assess/Plan: 1. PAF - she is asymptomatic. She will continue her Eliquis and beta blocker 2. Stroke -  she is on ASA and Eliquis. Will follow. 3. HTN - her blood pressure is high today. Her care giver states that she had just taken her meds prior to coming today. She typically is well controlled.  4. ILR - her device is working normally. Will will return for a recheck in several months.  Mikle Bosworth.D.

## 2017-01-16 LAB — CUP PACEART REMOTE DEVICE CHECK
MDC IDC PG IMPLANT DT: 20160714
MDC IDC SESS DTM: 20180705054639

## 2017-01-20 ENCOUNTER — Ambulatory Visit: Payer: Medicare Other | Admitting: Rehabilitation

## 2017-01-21 DIAGNOSIS — N390 Urinary tract infection, site not specified: Secondary | ICD-10-CM | POA: Diagnosis not present

## 2017-01-22 DIAGNOSIS — N390 Urinary tract infection, site not specified: Secondary | ICD-10-CM | POA: Diagnosis not present

## 2017-01-27 ENCOUNTER — Ambulatory Visit: Payer: Medicare Other | Admitting: Rehabilitation

## 2017-01-28 ENCOUNTER — Encounter: Payer: Self-pay | Admitting: Physical Therapy

## 2017-01-28 NOTE — Therapy (Signed)
[facility] [address] [place], [place], [address] Phone: [phone]   Fax:  [phone]  Patient Details  Name: Angela Keith MRN: [id number] Date of Birth: Dec 15, 1929 Referring Provider:  No ref. provider found  Encounter Date: 01/28/2017  PHYSICAL THERAPY DISCHARGE SUMMARY  Visits from Start of Care: 9  Current functional level related to goals / functional outcomes: Unknown due to pt did not return to final PT visits and requested to cancel all remaining visits due to fear of falling.    Remaining deficits: Impaired cognition, impaired strength, motor planning/sequencing, impaired postural control, balance, gait and high falls risk.   Education / Equipment: HEP  Plan: Patient agrees to discharge.  Patient goals were not met. Patient is being discharged due to the patient's request.  ?????         PT Short Term Goals - 01/03/17 1144      PT SHORT TERM GOAL #1   Title Pt/caregiver will be independent with initial HEP in order to indicate improved functional mobility and decreased fall risk.  (Target Date: 01/04/17)   Baseline did not re-assess as she was given many exercises just last visit   Status On-going     PT SHORT TERM GOAL #2   Title Will assess 5TSS and improve time by 5 secs in order to indicate improved functional strength.     Baseline 28.81 seconds with mod-max A of therapist to perform safely, 68.82 secs with BUEs and min A to RW   Status Not Met     PT SHORT TERM GOAL #3   Title Pt will perform squat pivot transfers to the R or L consistently at min A level in order to increase independence with functional transfers.    Baseline min A either direction 01/03/17   Status Achieved     PT SHORT TERM GOAL #4   Title Pt will perform dynamic standing balance at counter top with single UE support x 3 mins at min/guard level in order to improve independence with ADLs.    Baseline stood for 5.30  mins with min A on 01/03/17   Status Partially Met     PT SHORT TERM GOAL #5   Title Pt will ambulate x 25' w/ LRAD at min A level over indoor surfaces in order to increase functional mobility at home.    Baseline min A with RW x 29' 01/03/17   Status Achieved         PT Long Term Goals - 12/05/16 1930      PT LONG TERM GOAL #1   Title Pt/caregiver will be independent with final HEP in order to indicate improved functional mobility and decreased fall risk.  (Target Date: 02/03/17)   Status New     PT LONG TERM GOAL #2   Title Pt will improve 5TSS by 10 secs from baseline in order to indicate improved functional strength.     Status New     PT LONG TERM GOAL #3   Title Pt will perform stand pivot transfers w/ LRAD at S level in order to indicate improved functional independence.     Status New     PT LONG TERM GOAL #4   Title Pt will perform dynamic standing balance with single UE support (reaching upwards and laterally with opposite UE) x 5 mins at S level in order to indicate increased independence with ADLs.    Status New     PT LONG TERM  GOAL #5   Title Pt will ambulate 76' w/ LRAD over indoor surfaces at S level in order to indicate improved functional independence.     Status New         G-Codes - 02/24/2017 1528    Functional Assessment Tool Used (Outpatient Only) 5 times sit to stand >68 seconds, min A transfers, gait min A 29'   Functional Limitation Mobility: Walking and moving around   Mobility: Walking and Moving Around Goal Status [phone]) At least 20 percent but less than 40 percent impaired, limited or restricted   Mobility: Walking and Moving Around Discharge Status [phone]) At least 40 percent but less than 60 percent impaired, limited or restricted      Raylene Everts, PT, DPT February 24, 2017    3:33 PM    [facility] [address] [place], [place], [address] Phone: [phone]   Fax:  [phone]

## 2017-01-30 ENCOUNTER — Ambulatory Visit: Payer: Self-pay | Admitting: Rehabilitation

## 2017-02-03 ENCOUNTER — Ambulatory Visit: Payer: Medicare Other | Admitting: Physical Therapy

## 2017-02-10 ENCOUNTER — Ambulatory Visit (INDEPENDENT_AMBULATORY_CARE_PROVIDER_SITE_OTHER): Payer: Medicare Other | Admitting: *Deleted

## 2017-02-10 DIAGNOSIS — I639 Cerebral infarction, unspecified: Secondary | ICD-10-CM

## 2017-02-11 NOTE — Progress Notes (Signed)
Carelink Summary Report / Loop Recorder 

## 2017-02-13 ENCOUNTER — Other Ambulatory Visit: Payer: Self-pay | Admitting: Internal Medicine

## 2017-02-19 LAB — CUP PACEART REMOTE DEVICE CHECK
Date Time Interrogation Session: 20180804064401
Implantable Pulse Generator Implant Date: 20160714

## 2017-02-25 DIAGNOSIS — K5900 Constipation, unspecified: Secondary | ICD-10-CM | POA: Diagnosis not present

## 2017-02-25 DIAGNOSIS — F410 Panic disorder [episodic paroxysmal anxiety] without agoraphobia: Secondary | ICD-10-CM | POA: Diagnosis not present

## 2017-02-25 DIAGNOSIS — I639 Cerebral infarction, unspecified: Secondary | ICD-10-CM | POA: Diagnosis not present

## 2017-03-11 ENCOUNTER — Ambulatory Visit (INDEPENDENT_AMBULATORY_CARE_PROVIDER_SITE_OTHER): Payer: Medicare Other | Admitting: *Deleted

## 2017-03-11 DIAGNOSIS — I639 Cerebral infarction, unspecified: Secondary | ICD-10-CM | POA: Diagnosis not present

## 2017-03-13 NOTE — Progress Notes (Signed)
Carelink Summary Report / Loop Recorder 

## 2017-03-17 LAB — CUP PACEART REMOTE DEVICE CHECK
Date Time Interrogation Session: 20180903103854
MDC IDC PG IMPLANT DT: 20160714

## 2017-03-26 DIAGNOSIS — K5900 Constipation, unspecified: Secondary | ICD-10-CM | POA: Diagnosis not present

## 2017-03-26 DIAGNOSIS — Z23 Encounter for immunization: Secondary | ICD-10-CM | POA: Diagnosis not present

## 2017-03-26 DIAGNOSIS — F410 Panic disorder [episodic paroxysmal anxiety] without agoraphobia: Secondary | ICD-10-CM | POA: Diagnosis not present

## 2017-03-26 DIAGNOSIS — I639 Cerebral infarction, unspecified: Secondary | ICD-10-CM | POA: Diagnosis not present

## 2017-04-09 ENCOUNTER — Ambulatory Visit (INDEPENDENT_AMBULATORY_CARE_PROVIDER_SITE_OTHER): Payer: Medicare Other | Admitting: *Deleted

## 2017-04-09 DIAGNOSIS — I639 Cerebral infarction, unspecified: Secondary | ICD-10-CM

## 2017-04-09 NOTE — Progress Notes (Signed)
Carelink Summary Report / Loop Recorder 

## 2017-04-11 LAB — CUP PACEART REMOTE DEVICE CHECK
Date Time Interrogation Session: 20181003113705
MDC IDC PG IMPLANT DT: 20160714

## 2017-05-09 ENCOUNTER — Ambulatory Visit (INDEPENDENT_AMBULATORY_CARE_PROVIDER_SITE_OTHER): Payer: Medicare Other | Admitting: *Deleted

## 2017-05-09 DIAGNOSIS — I639 Cerebral infarction, unspecified: Secondary | ICD-10-CM | POA: Diagnosis not present

## 2017-05-09 NOTE — Progress Notes (Signed)
Carelink Summary Report / Loop Recorder 

## 2017-05-12 LAB — CUP PACEART REMOTE DEVICE CHECK
MDC IDC PG IMPLANT DT: 20160714
MDC IDC SESS DTM: 20181102123927

## 2017-06-09 ENCOUNTER — Ambulatory Visit (INDEPENDENT_AMBULATORY_CARE_PROVIDER_SITE_OTHER): Payer: Medicare Other | Admitting: *Deleted

## 2017-06-09 DIAGNOSIS — I639 Cerebral infarction, unspecified: Secondary | ICD-10-CM

## 2017-06-09 NOTE — Progress Notes (Signed)
Carelink Summary Report / Loop Recorder 

## 2017-06-17 LAB — CUP PACEART REMOTE DEVICE CHECK
Date Time Interrogation Session: 20181202143936
MDC IDC PG IMPLANT DT: 20160714

## 2017-07-09 ENCOUNTER — Ambulatory Visit (INDEPENDENT_AMBULATORY_CARE_PROVIDER_SITE_OTHER): Payer: Medicare Other | Admitting: *Deleted

## 2017-07-09 DIAGNOSIS — I639 Cerebral infarction, unspecified: Secondary | ICD-10-CM | POA: Diagnosis not present

## 2017-07-10 NOTE — Progress Notes (Signed)
Carelink Summary Report / Loop Recorder 

## 2017-07-22 LAB — CUP PACEART REMOTE DEVICE CHECK
Implantable Pulse Generator Implant Date: 20160714
MDC IDC SESS DTM: 20190101143742

## 2017-08-07 ENCOUNTER — Ambulatory Visit (INDEPENDENT_AMBULATORY_CARE_PROVIDER_SITE_OTHER): Payer: Medicare Other | Admitting: *Deleted

## 2017-08-07 DIAGNOSIS — I639 Cerebral infarction, unspecified: Secondary | ICD-10-CM | POA: Diagnosis not present

## 2017-08-07 NOTE — Progress Notes (Signed)
Carelink Summary Report / Loop Recorder 

## 2017-08-19 LAB — CUP PACEART REMOTE DEVICE CHECK
Date Time Interrogation Session: 20190131151138
MDC IDC PG IMPLANT DT: 20160714

## 2017-09-09 ENCOUNTER — Ambulatory Visit (INDEPENDENT_AMBULATORY_CARE_PROVIDER_SITE_OTHER): Payer: Medicare Other | Admitting: *Deleted

## 2017-09-09 DIAGNOSIS — I639 Cerebral infarction, unspecified: Secondary | ICD-10-CM

## 2017-09-09 NOTE — Progress Notes (Signed)
Carelink Summary Report / Loop Recorder 

## 2017-09-11 ENCOUNTER — Other Ambulatory Visit: Payer: Self-pay | Admitting: Internal Medicine

## 2017-09-15 DIAGNOSIS — F329 Major depressive disorder, single episode, unspecified: Secondary | ICD-10-CM | POA: Diagnosis not present

## 2017-09-15 DIAGNOSIS — I639 Cerebral infarction, unspecified: Secondary | ICD-10-CM | POA: Diagnosis not present

## 2017-09-15 DIAGNOSIS — F322 Major depressive disorder, single episode, severe without psychotic features: Secondary | ICD-10-CM | POA: Diagnosis not present

## 2017-09-15 DIAGNOSIS — K5900 Constipation, unspecified: Secondary | ICD-10-CM | POA: Diagnosis not present

## 2017-09-15 DIAGNOSIS — F410 Panic disorder [episodic paroxysmal anxiety] without agoraphobia: Secondary | ICD-10-CM | POA: Diagnosis not present

## 2017-09-15 DIAGNOSIS — Z0000 Encounter for general adult medical examination without abnormal findings: Secondary | ICD-10-CM | POA: Diagnosis not present

## 2017-09-15 DIAGNOSIS — M4726 Other spondylosis with radiculopathy, lumbar region: Secondary | ICD-10-CM | POA: Diagnosis not present

## 2017-09-15 DIAGNOSIS — Z13820 Encounter for screening for osteoporosis: Secondary | ICD-10-CM | POA: Diagnosis not present

## 2017-09-15 DIAGNOSIS — E782 Mixed hyperlipidemia: Secondary | ICD-10-CM | POA: Diagnosis not present

## 2017-09-15 DIAGNOSIS — Z1389 Encounter for screening for other disorder: Secondary | ICD-10-CM | POA: Diagnosis not present

## 2017-09-30 ENCOUNTER — Telehealth: Payer: Self-pay | Admitting: Cardiology

## 2017-09-30 NOTE — Telephone Encounter (Signed)
LMOVM requesting that pt send manual transmission b/c home monitor has not updated in at least 14 days.    

## 2017-10-13 ENCOUNTER — Ambulatory Visit (INDEPENDENT_AMBULATORY_CARE_PROVIDER_SITE_OTHER): Payer: Medicare Other | Admitting: *Deleted

## 2017-10-13 DIAGNOSIS — I639 Cerebral infarction, unspecified: Secondary | ICD-10-CM | POA: Diagnosis not present

## 2017-10-14 NOTE — Progress Notes (Signed)
Carelink Summary Report / Loop Recorder 

## 2017-10-21 LAB — CUP PACEART REMOTE DEVICE CHECK
Date Time Interrogation Session: 20190305153603
MDC IDC PG IMPLANT DT: 20160714

## 2017-11-03 ENCOUNTER — Telehealth: Payer: Self-pay | Admitting: *Deleted

## 2017-11-03 NOTE — Telephone Encounter (Signed)
Ms. Zachery Dauer returning Emily's call regarding brady episode from 10/26/17 at 6:21am. She reports that Ms. Burnette is normally sleeping at that time. She will have Ms. Gettel send a manual transmission shortly.

## 2017-11-03 NOTE — Telephone Encounter (Signed)
Manual transmission received.  Second brady episode is from 10/26/17 at 0629, duration 11sec.  ECGs printed and placed in Dr. Valarie Cones folder for review.

## 2017-11-03 NOTE — Telephone Encounter (Signed)
LMOVM requesting return call to the [facility].  Will request manual transmission for review of 1 brady episode that did not transmit automatically.  Available brady episode from 10/26/17 at 0621 is appropriate, duration 10sec.  Will determine if patient was symptomatic with episode.

## 2017-11-10 DIAGNOSIS — M810 Age-related osteoporosis without current pathological fracture: Secondary | ICD-10-CM | POA: Diagnosis not present

## 2017-11-14 ENCOUNTER — Ambulatory Visit (INDEPENDENT_AMBULATORY_CARE_PROVIDER_SITE_OTHER): Payer: Medicare Other | Admitting: *Deleted

## 2017-11-14 DIAGNOSIS — I639 Cerebral infarction, unspecified: Secondary | ICD-10-CM | POA: Diagnosis not present

## 2017-11-14 LAB — CUP PACEART REMOTE DEVICE CHECK
Date Time Interrogation Session: 20190407154159
MDC IDC PG IMPLANT DT: 20160714

## 2017-11-17 NOTE — Progress Notes (Signed)
Carelink Summary Report / Loop Recorder 

## 2017-12-09 LAB — CUP PACEART REMOTE DEVICE CHECK
Date Time Interrogation Session: 20190510160951
Implantable Pulse Generator Implant Date: 20160714

## 2017-12-17 ENCOUNTER — Ambulatory Visit (INDEPENDENT_AMBULATORY_CARE_PROVIDER_SITE_OTHER): Payer: Medicare Other | Admitting: *Deleted

## 2017-12-17 DIAGNOSIS — I639 Cerebral infarction, unspecified: Secondary | ICD-10-CM | POA: Diagnosis not present

## 2017-12-17 NOTE — Progress Notes (Signed)
Carelink Summary Report / Loop Recorder 

## 2017-12-25 ENCOUNTER — Other Ambulatory Visit: Payer: Self-pay | Admitting: Internal Medicine

## 2017-12-31 ENCOUNTER — Telehealth: Payer: Self-pay

## 2017-12-31 NOTE — Telephone Encounter (Signed)
Confirmed remote transmission w/ pt daughter.   

## 2018-01-06 ENCOUNTER — Telehealth: Payer: Self-pay | Admitting: *Deleted

## 2018-01-06 NOTE — Telephone Encounter (Signed)
LMOM to return call to [facility].  Daughter-in-law Norwood LevoDina calling back- advised that her loop recorder battery is recommended to be replaced. She verbalizes understanding. They will be out of town for 2 weeks coming up and will return on 01/31/18. She is due to see Dr. Ladona Ridgelaylor- sent to scheduling.

## 2018-01-19 ENCOUNTER — Ambulatory Visit (INDEPENDENT_AMBULATORY_CARE_PROVIDER_SITE_OTHER): Payer: Medicare Other | Admitting: *Deleted

## 2018-01-19 DIAGNOSIS — I639 Cerebral infarction, unspecified: Secondary | ICD-10-CM

## 2018-01-20 NOTE — Progress Notes (Signed)
Carelink Summary Report / Loop Recorder 

## 2018-01-22 ENCOUNTER — Telehealth: Payer: Self-pay

## 2018-01-22 NOTE — Telephone Encounter (Signed)
Confirmed remote transmission w/ pt daughter.  Pt daughter stated that someone called to say she reached rrt and not to worry about sending a transmission.

## 2018-01-23 LAB — CUP PACEART REMOTE DEVICE CHECK
MDC IDC PG IMPLANT DT: 20160714
MDC IDC SESS DTM: 20190612163943

## 2018-02-18 ENCOUNTER — Encounter: Payer: Self-pay | Admitting: Internal Medicine

## 2018-02-18 ENCOUNTER — Ambulatory Visit (INDEPENDENT_AMBULATORY_CARE_PROVIDER_SITE_OTHER): Payer: Medicare Other | Admitting: Internal Medicine

## 2018-02-18 VITALS — BP 112/78 | HR 50 | Ht 65.0 in | Wt 122.0 lb

## 2018-02-18 DIAGNOSIS — I10 Essential (primary) hypertension: Secondary | ICD-10-CM

## 2018-02-18 DIAGNOSIS — I639 Cerebral infarction, unspecified: Secondary | ICD-10-CM

## 2018-02-18 NOTE — Patient Instructions (Addendum)
Medication Instructions:  Your physician has recommended you make the following change in your medication:  1.  Start weaning off your metoprolol 25 mg tablets:  A.  Take 1/2 tablet by mouth twice a day for one week.  B.  Then reduce to 1/2 tablet by mouth once daily for one week.  C.  Then stop  Labwork: None ordered.  Testing/Procedures: None ordered.  Follow-Up: Your physician wants you to follow-up in: one year with Dr. Ladona Ridgelaylor.   You will receive a reminder letter in the mail two months in advance. If you don't receive a letter, please call our office to schedule the follow-up appointment.  Any Other Special Instructions Will Be Listed Below (If Applicable).  If you need a refill on your cardiac medications before your next appointment, please call your pharmacy.

## 2018-02-18 NOTE — Progress Notes (Signed)
HPI Ms. Winterhalter returns today for followup. She is a pleasant 84 yo woman with multiple medical problems who sustained a stroke, had an ILR inserted and then on cardiac monitoring developed atrial fib with a CVR/RVR for which she was asymptomatic. In the interim, she has sustained another stroke, despite being on her Eliquis. She had her Eliquis dose reduced and has also been placed on ASA 81 mg daily. She denies sob but did experience an episode of chest pressure. She has had worsening of her mental function. No bleeding on the higher dose of Eliquis. Allergies  Allergen Reactions  . Ace Inhibitors Other (See Comments)    Severe fatigue  . Lipitor [Atorvastatin] Other (See Comments)    Mood swings  . Motrin [Ibuprofen] Nausea And Vomiting     Current Outpatient Medications  Medication Sig Dispense Refill  . acetaminophen (TYLENOL) 325 MG tablet Take 650 mg by mouth every 6 (six) hours as needed for mild pain or moderate pain.    Marland Kitchen alendronate (FOSAMAX) 70 MG tablet Take 70 mg by mouth once a week.  11  . apixaban (ELIQUIS) 5 MG TABS tablet Take 5 mg by mouth 2 (two) times daily.     Marland Kitchen aspirin EC 81 MG EC tablet Take 1 tablet (81 mg total) by mouth daily. 30 tablet 0  . baclofen (LIORESAL) 10 MG tablet Take 5 mg by mouth 2 (two) times daily as needed for muscle spasms.    . dorzolamide-timolol (COSOPT) 22.3-6.8 MG/ML ophthalmic solution Place 1 drop into both eyes 2 (two) times daily.    Marland Kitchen gabapentin (NEURONTIN) 100 MG capsule Take 100 mg by mouth 2 (two) times daily. At breakfast and lunch     . HYDROcodone-acetaminophen (NORCO/VICODIN) 5-325 MG tablet Take 1 tablet by mouth 2 (two) times daily as needed for moderate pain or severe pain. 2 tablet 0  . latanoprost (XALATAN) 0.005 % ophthalmic solution Place 1 drop into both eyes at bedtime.    Marland Kitchen levothyroxine (SYNTHROID, LEVOTHROID) 88 MCG tablet Take 88 mcg by mouth daily before breakfast.    . lidocaine (LIDODERM) 5 % Place 1 patch  onto the skin daily. Left lateral thigh  Remove & Discard patch within 12 hours or as directed by MD    . metoprolol tartrate (LOPRESSOR) 25 MG tablet Take 25 mg by mouth 2 (two) times daily.    . mirtazapine (REMERON) 15 MG tablet Take 15 mg by mouth at bedtime.    . nitroGLYCERIN (NITROSTAT) 0.4 MG SL tablet Take 0.4 mg sublingual tablet under the tongue as needed for chest pressure 30 tablet 3  . NUTRITIONAL SUPPLEMENTS PO Take Medpass 4 oz by mouth 2 times daily to support nutritional needs    . pantoprazole (PROTONIX) 40 MG tablet Take 40 mg by mouth 2 (two) times daily.     Marland Kitchen PARoxetine (PAXIL-CR) 12.5 MG 24 hr tablet Take 1 tablet by mouth daily.    . pravastatin (PRAVACHOL) 20 MG tablet Take 1 tablet (20 mg total) by mouth daily at 6 PM. 30 tablet 0  . QUEtiapine (SEROQUEL) 25 MG tablet Take 50 mg by mouth at bedtime.     . tamsulosin (FLOMAX) 0.4 MG CAPS capsule TAKE 1 CAPSULE BY MOUTH ONCE DAILY 30 capsule 0  . vitamin B-12 (CYANOCOBALAMIN) 1000 MCG tablet Take 1,000 mcg by mouth daily.      No current facility-administered medications for this visit.      Past Medical History:  Diagnosis Date  . Arthritis   . Atrial fibrillation (HCC)   . Chronic neck pain   . DJD (degenerative joint disease), cervical   . DJD (degenerative joint disease), lumbar   . Dysphagia   . GERD (gastroesophageal reflux disease)   . Glaucoma   . HTN (hypertension) 10/30/2012  . Hyperlipidemia   . Hypothyroidism   . Insomnia   . Nocturia   . Right bundle branch block 03/21/2014  . Rotoscoliosis   . Severe hearing loss   . Shortness of breath dyspnea   . Stroke (HCC)   . Unspecified hypothyroidism 10/30/2012    ROS:   All systems reviewed and negative except as noted in the HPI.   Past Surgical History:  Procedure Laterality Date  . BACK SURGERY     lower back   . bladder laser surgery    . BREAST SURGERY    . cataract surgery    . EP IMPLANTABLE DEVICE N/A 01/19/2015   Procedure: Loop  Recorder Insertion;  Surgeon: Marinus MawGregg W Jeffery Bachmeier, MD;  [facility];  Service: Cardiovascular;  Laterality: N/A;  . ESOPHAGOGASTRODUODENOSCOPY (EGD) WITH ESOPHAGEAL DILATION  06/08/2012   Procedure: ESOPHAGOGASTRODUODENOSCOPY (EGD) WITH ESOPHAGEAL DILATION;  Surgeon: Charolett BumpersMartin K Johnson, MD;  Location: [facility] ENDOSCOPY;  Service: Endoscopy;  Laterality: N/A;  . facail surgery  2000   facelift and eyelid lift  . left carpal tunnel surgery    . LUMBAR LAMINECTOMY/DECOMPRESSION MICRODISCECTOMY N/A 01/17/2015   Procedure: Laminectomy and Foraminotomy - L2-L3 - L3-L4;  Surgeon: Julio SicksHenry Pool, MD;  Location: [facility] NEURO ORS;  Service: Neurosurgery;  Laterality: N/A;  Laminectomy and Foraminotomy - L2-L3 - L3-L4  . TEE WITHOUT CARDIOVERSION N/A 01/19/2015   Procedure: TRANSESOPHAGEAL ECHOCARDIOGRAM (TEE);  Surgeon: Chrystie NoseKenneth C Hilty, MD;  [facility];  Service: Cardiovascular;  Laterality: N/A;     Family History  Problem Relation Age of Onset  . CAD Father   . Stroke Father   . Diabetes Mother   . Lung cancer Other   . CAD Brother      Social History   Socioeconomic History  . Marital status: Widowed    Spouse name: Not on file  . Number of children: 2  . Years of education: 10  . Highest education level: Not on file  Occupational History  . 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 Financial resource strain: Not on file  . Food insecurity:    Worry: Not on file    Inability: Not on file  . Transportation needs:    Medical: Not on file    Non-medical: Not on file  Tobacco Use  . Smoking status: Never Smoker  . Smokeless tobacco: Never Used  Substance and Sexual Activity  . Alcohol use: No  . Drug use: No  . Sexual activity: Never  Lifestyle  . Physical activity:    Days per week: Not on file    Minutes per session: Not on file  . Stress: Not on file  Relationships  . Social connections:    Talks on phone: Not on file    Gets  together: Not on file    Attends religious service: Not on file    Active member of club or organization: Not on file    Attends meetings of clubs or organizations: Not on file    Relationship status: Not on file  . Intimate partner violence:    Fear of current or  ex partner: Not on file    Emotionally abused: Not on file    Physically abused: Not on file    Forced sexual activity: Not on file  Other Topics Concern  . Not on file  Social History Narrative   Widowed, 2 sons   Right handed   caffeine use- none     BP 112/78   Pulse (!) 50   Ht 5\' 5"  (1.651 m)   Wt 122 lb (55.3 kg)   SpO2 96%   BMI 20.30 kg/m   Physical Exam:  Well appearing NAD HEENT: Unremarkable Neck:  No JVD, no thyromegally Lymphatics:  No adenopathy Back:  No CVA tenderness Lungs:  Clear HEART:  Regular rate rhythm, no murmurs, no rubs, no clicks Abd:  soft, positive bowel sounds, no organomegally, no rebound, no guarding Ext:  2 plus pulses, no edema, no cyanosis, no clubbing Skin:  No rashes no nodules Neuro:  CN II through XII intact, motor grossly intact  EKG - nsr with RBBB   Assess/Plan: 1. PAF - she is maintaining NSR. No change in her meds. 2. HTN - her pressure is down. I have asked her to wean off of her metoprolol. 3. Stroke - she will continue her current dose of blood thinners as above.   Leonia ReevesGregg Yalexa Blust,M.D.

## 2018-02-19 ENCOUNTER — Other Ambulatory Visit: Payer: Self-pay | Admitting: Internal Medicine

## 2018-02-24 ENCOUNTER — Other Ambulatory Visit: Payer: Self-pay | Admitting: Internal Medicine

## 2018-03-06 ENCOUNTER — Telehealth: Payer: Self-pay

## 2018-03-06 NOTE — Telephone Encounter (Signed)
Confirmed remote transmission w/ pt daughter law.  Pt reach rrt and had it removed.

## 2018-03-09 LAB — CUP PACEART REMOTE DEVICE CHECK
Date Time Interrogation Session: 20190715164044
Implantable Pulse Generator Implant Date: 20160714

## 2018-03-23 DIAGNOSIS — R350 Frequency of micturition: Secondary | ICD-10-CM | POA: Diagnosis not present

## 2018-03-24 DIAGNOSIS — R309 Painful micturition, unspecified: Secondary | ICD-10-CM | POA: Diagnosis not present

## 2018-03-24 DIAGNOSIS — K5900 Constipation, unspecified: Secondary | ICD-10-CM | POA: Diagnosis not present

## 2018-03-24 DIAGNOSIS — E039 Hypothyroidism, unspecified: Secondary | ICD-10-CM | POA: Diagnosis not present

## 2018-03-24 DIAGNOSIS — G629 Polyneuropathy, unspecified: Secondary | ICD-10-CM | POA: Diagnosis not present

## 2018-03-24 DIAGNOSIS — E782 Mixed hyperlipidemia: Secondary | ICD-10-CM | POA: Diagnosis not present

## 2018-03-24 DIAGNOSIS — F322 Major depressive disorder, single episode, severe without psychotic features: Secondary | ICD-10-CM | POA: Diagnosis not present

## 2018-03-24 DIAGNOSIS — I10 Essential (primary) hypertension: Secondary | ICD-10-CM | POA: Diagnosis not present

## 2018-03-24 DIAGNOSIS — I69354 Hemiplegia and hemiparesis following cerebral infarction affecting left non-dominant side: Secondary | ICD-10-CM | POA: Diagnosis not present

## 2018-03-24 DIAGNOSIS — Z23 Encounter for immunization: Secondary | ICD-10-CM | POA: Diagnosis not present

## 2018-03-24 DIAGNOSIS — M810 Age-related osteoporosis without current pathological fracture: Secondary | ICD-10-CM | POA: Diagnosis not present

## 2018-04-02 DIAGNOSIS — M47816 Spondylosis without myelopathy or radiculopathy, lumbar region: Secondary | ICD-10-CM | POA: Diagnosis not present

## 2018-04-02 DIAGNOSIS — H9190 Unspecified hearing loss, unspecified ear: Secondary | ICD-10-CM | POA: Diagnosis not present

## 2018-04-02 DIAGNOSIS — E039 Hypothyroidism, unspecified: Secondary | ICD-10-CM | POA: Diagnosis not present

## 2018-04-02 DIAGNOSIS — Z87891 Personal history of nicotine dependence: Secondary | ICD-10-CM | POA: Diagnosis not present

## 2018-04-02 DIAGNOSIS — G4700 Insomnia, unspecified: Secondary | ICD-10-CM | POA: Diagnosis not present

## 2018-04-02 DIAGNOSIS — E7800 Pure hypercholesterolemia, unspecified: Secondary | ICD-10-CM | POA: Diagnosis not present

## 2018-04-02 DIAGNOSIS — H409 Unspecified glaucoma: Secondary | ICD-10-CM | POA: Diagnosis not present

## 2018-04-02 DIAGNOSIS — I10 Essential (primary) hypertension: Secondary | ICD-10-CM | POA: Diagnosis not present

## 2018-04-02 DIAGNOSIS — Z9181 History of falling: Secondary | ICD-10-CM | POA: Diagnosis not present

## 2018-04-02 DIAGNOSIS — G629 Polyneuropathy, unspecified: Secondary | ICD-10-CM | POA: Diagnosis not present

## 2018-04-02 DIAGNOSIS — Z7901 Long term (current) use of anticoagulants: Secondary | ICD-10-CM | POA: Diagnosis not present

## 2018-04-02 DIAGNOSIS — Z7982 Long term (current) use of aspirin: Secondary | ICD-10-CM | POA: Diagnosis not present

## 2018-04-02 DIAGNOSIS — N319 Neuromuscular dysfunction of bladder, unspecified: Secondary | ICD-10-CM | POA: Diagnosis not present

## 2018-04-02 DIAGNOSIS — M47812 Spondylosis without myelopathy or radiculopathy, cervical region: Secondary | ICD-10-CM | POA: Diagnosis not present

## 2018-04-02 DIAGNOSIS — F329 Major depressive disorder, single episode, unspecified: Secondary | ICD-10-CM | POA: Diagnosis not present

## 2018-04-02 DIAGNOSIS — M810 Age-related osteoporosis without current pathological fracture: Secondary | ICD-10-CM | POA: Diagnosis not present

## 2018-04-02 DIAGNOSIS — I69354 Hemiplegia and hemiparesis following cerebral infarction affecting left non-dominant side: Secondary | ICD-10-CM | POA: Diagnosis not present

## 2018-04-03 DIAGNOSIS — M47812 Spondylosis without myelopathy or radiculopathy, cervical region: Secondary | ICD-10-CM | POA: Diagnosis not present

## 2018-04-03 DIAGNOSIS — M810 Age-related osteoporosis without current pathological fracture: Secondary | ICD-10-CM | POA: Diagnosis not present

## 2018-04-03 DIAGNOSIS — I10 Essential (primary) hypertension: Secondary | ICD-10-CM | POA: Diagnosis not present

## 2018-04-03 DIAGNOSIS — I69354 Hemiplegia and hemiparesis following cerebral infarction affecting left non-dominant side: Secondary | ICD-10-CM | POA: Diagnosis not present

## 2018-04-03 DIAGNOSIS — M47816 Spondylosis without myelopathy or radiculopathy, lumbar region: Secondary | ICD-10-CM | POA: Diagnosis not present

## 2018-04-03 DIAGNOSIS — G629 Polyneuropathy, unspecified: Secondary | ICD-10-CM | POA: Diagnosis not present

## 2018-04-07 DIAGNOSIS — M810 Age-related osteoporosis without current pathological fracture: Secondary | ICD-10-CM | POA: Diagnosis not present

## 2018-04-07 DIAGNOSIS — I69354 Hemiplegia and hemiparesis following cerebral infarction affecting left non-dominant side: Secondary | ICD-10-CM | POA: Diagnosis not present

## 2018-04-07 DIAGNOSIS — M47816 Spondylosis without myelopathy or radiculopathy, lumbar region: Secondary | ICD-10-CM | POA: Diagnosis not present

## 2018-04-07 DIAGNOSIS — M47812 Spondylosis without myelopathy or radiculopathy, cervical region: Secondary | ICD-10-CM | POA: Diagnosis not present

## 2018-04-07 DIAGNOSIS — G629 Polyneuropathy, unspecified: Secondary | ICD-10-CM | POA: Diagnosis not present

## 2018-04-07 DIAGNOSIS — I10 Essential (primary) hypertension: Secondary | ICD-10-CM | POA: Diagnosis not present

## 2018-04-08 DIAGNOSIS — I10 Essential (primary) hypertension: Secondary | ICD-10-CM | POA: Diagnosis not present

## 2018-04-08 DIAGNOSIS — I69354 Hemiplegia and hemiparesis following cerebral infarction affecting left non-dominant side: Secondary | ICD-10-CM | POA: Diagnosis not present

## 2018-04-08 DIAGNOSIS — M47812 Spondylosis without myelopathy or radiculopathy, cervical region: Secondary | ICD-10-CM | POA: Diagnosis not present

## 2018-04-08 DIAGNOSIS — G629 Polyneuropathy, unspecified: Secondary | ICD-10-CM | POA: Diagnosis not present

## 2018-04-08 DIAGNOSIS — M47816 Spondylosis without myelopathy or radiculopathy, lumbar region: Secondary | ICD-10-CM | POA: Diagnosis not present

## 2018-04-08 DIAGNOSIS — M810 Age-related osteoporosis without current pathological fracture: Secondary | ICD-10-CM | POA: Diagnosis not present

## 2018-04-09 DIAGNOSIS — I69354 Hemiplegia and hemiparesis following cerebral infarction affecting left non-dominant side: Secondary | ICD-10-CM | POA: Diagnosis not present

## 2018-04-09 DIAGNOSIS — G629 Polyneuropathy, unspecified: Secondary | ICD-10-CM | POA: Diagnosis not present

## 2018-04-09 DIAGNOSIS — M810 Age-related osteoporosis without current pathological fracture: Secondary | ICD-10-CM | POA: Diagnosis not present

## 2018-04-09 DIAGNOSIS — M47816 Spondylosis without myelopathy or radiculopathy, lumbar region: Secondary | ICD-10-CM | POA: Diagnosis not present

## 2018-04-09 DIAGNOSIS — I10 Essential (primary) hypertension: Secondary | ICD-10-CM | POA: Diagnosis not present

## 2018-04-09 DIAGNOSIS — M47812 Spondylosis without myelopathy or radiculopathy, cervical region: Secondary | ICD-10-CM | POA: Diagnosis not present

## 2018-04-10 DIAGNOSIS — M47812 Spondylosis without myelopathy or radiculopathy, cervical region: Secondary | ICD-10-CM | POA: Diagnosis not present

## 2018-04-10 DIAGNOSIS — G629 Polyneuropathy, unspecified: Secondary | ICD-10-CM | POA: Diagnosis not present

## 2018-04-10 DIAGNOSIS — M810 Age-related osteoporosis without current pathological fracture: Secondary | ICD-10-CM | POA: Diagnosis not present

## 2018-04-10 DIAGNOSIS — I10 Essential (primary) hypertension: Secondary | ICD-10-CM | POA: Diagnosis not present

## 2018-04-10 DIAGNOSIS — M47816 Spondylosis without myelopathy or radiculopathy, lumbar region: Secondary | ICD-10-CM | POA: Diagnosis not present

## 2018-04-10 DIAGNOSIS — I69354 Hemiplegia and hemiparesis following cerebral infarction affecting left non-dominant side: Secondary | ICD-10-CM | POA: Diagnosis not present

## 2018-04-13 DIAGNOSIS — G629 Polyneuropathy, unspecified: Secondary | ICD-10-CM | POA: Diagnosis not present

## 2018-04-13 DIAGNOSIS — M810 Age-related osteoporosis without current pathological fracture: Secondary | ICD-10-CM | POA: Diagnosis not present

## 2018-04-13 DIAGNOSIS — M47812 Spondylosis without myelopathy or radiculopathy, cervical region: Secondary | ICD-10-CM | POA: Diagnosis not present

## 2018-04-13 DIAGNOSIS — I69354 Hemiplegia and hemiparesis following cerebral infarction affecting left non-dominant side: Secondary | ICD-10-CM | POA: Diagnosis not present

## 2018-04-13 DIAGNOSIS — I10 Essential (primary) hypertension: Secondary | ICD-10-CM | POA: Diagnosis not present

## 2018-04-13 DIAGNOSIS — M47816 Spondylosis without myelopathy or radiculopathy, lumbar region: Secondary | ICD-10-CM | POA: Diagnosis not present

## 2018-04-14 DIAGNOSIS — M47816 Spondylosis without myelopathy or radiculopathy, lumbar region: Secondary | ICD-10-CM | POA: Diagnosis not present

## 2018-04-14 DIAGNOSIS — I10 Essential (primary) hypertension: Secondary | ICD-10-CM | POA: Diagnosis not present

## 2018-04-14 DIAGNOSIS — M810 Age-related osteoporosis without current pathological fracture: Secondary | ICD-10-CM | POA: Diagnosis not present

## 2018-04-14 DIAGNOSIS — M47812 Spondylosis without myelopathy or radiculopathy, cervical region: Secondary | ICD-10-CM | POA: Diagnosis not present

## 2018-04-14 DIAGNOSIS — I69354 Hemiplegia and hemiparesis following cerebral infarction affecting left non-dominant side: Secondary | ICD-10-CM | POA: Diagnosis not present

## 2018-04-14 DIAGNOSIS — G629 Polyneuropathy, unspecified: Secondary | ICD-10-CM | POA: Diagnosis not present

## 2018-04-16 DIAGNOSIS — I10 Essential (primary) hypertension: Secondary | ICD-10-CM | POA: Diagnosis not present

## 2018-04-16 DIAGNOSIS — M47816 Spondylosis without myelopathy or radiculopathy, lumbar region: Secondary | ICD-10-CM | POA: Diagnosis not present

## 2018-04-16 DIAGNOSIS — G629 Polyneuropathy, unspecified: Secondary | ICD-10-CM | POA: Diagnosis not present

## 2018-04-16 DIAGNOSIS — M810 Age-related osteoporosis without current pathological fracture: Secondary | ICD-10-CM | POA: Diagnosis not present

## 2018-04-16 DIAGNOSIS — M47812 Spondylosis without myelopathy or radiculopathy, cervical region: Secondary | ICD-10-CM | POA: Diagnosis not present

## 2018-04-16 DIAGNOSIS — I69354 Hemiplegia and hemiparesis following cerebral infarction affecting left non-dominant side: Secondary | ICD-10-CM | POA: Diagnosis not present

## 2018-04-17 DIAGNOSIS — I69354 Hemiplegia and hemiparesis following cerebral infarction affecting left non-dominant side: Secondary | ICD-10-CM | POA: Diagnosis not present

## 2018-04-17 DIAGNOSIS — M810 Age-related osteoporosis without current pathological fracture: Secondary | ICD-10-CM | POA: Diagnosis not present

## 2018-04-17 DIAGNOSIS — M47816 Spondylosis without myelopathy or radiculopathy, lumbar region: Secondary | ICD-10-CM | POA: Diagnosis not present

## 2018-04-17 DIAGNOSIS — M47812 Spondylosis without myelopathy or radiculopathy, cervical region: Secondary | ICD-10-CM | POA: Diagnosis not present

## 2018-04-17 DIAGNOSIS — G629 Polyneuropathy, unspecified: Secondary | ICD-10-CM | POA: Diagnosis not present

## 2018-04-17 DIAGNOSIS — I10 Essential (primary) hypertension: Secondary | ICD-10-CM | POA: Diagnosis not present

## 2018-04-20 DIAGNOSIS — M47816 Spondylosis without myelopathy or radiculopathy, lumbar region: Secondary | ICD-10-CM | POA: Diagnosis not present

## 2018-04-20 DIAGNOSIS — M47812 Spondylosis without myelopathy or radiculopathy, cervical region: Secondary | ICD-10-CM | POA: Diagnosis not present

## 2018-04-20 DIAGNOSIS — I10 Essential (primary) hypertension: Secondary | ICD-10-CM | POA: Diagnosis not present

## 2018-04-20 DIAGNOSIS — M810 Age-related osteoporosis without current pathological fracture: Secondary | ICD-10-CM | POA: Diagnosis not present

## 2018-04-20 DIAGNOSIS — I69354 Hemiplegia and hemiparesis following cerebral infarction affecting left non-dominant side: Secondary | ICD-10-CM | POA: Diagnosis not present

## 2018-04-20 DIAGNOSIS — G629 Polyneuropathy, unspecified: Secondary | ICD-10-CM | POA: Diagnosis not present

## 2018-04-28 DIAGNOSIS — I10 Essential (primary) hypertension: Secondary | ICD-10-CM | POA: Diagnosis not present

## 2018-04-28 DIAGNOSIS — M810 Age-related osteoporosis without current pathological fracture: Secondary | ICD-10-CM | POA: Diagnosis not present

## 2018-04-28 DIAGNOSIS — M47816 Spondylosis without myelopathy or radiculopathy, lumbar region: Secondary | ICD-10-CM | POA: Diagnosis not present

## 2018-04-28 DIAGNOSIS — M47812 Spondylosis without myelopathy or radiculopathy, cervical region: Secondary | ICD-10-CM | POA: Diagnosis not present

## 2018-04-28 DIAGNOSIS — I69354 Hemiplegia and hemiparesis following cerebral infarction affecting left non-dominant side: Secondary | ICD-10-CM | POA: Diagnosis not present

## 2018-04-28 DIAGNOSIS — G629 Polyneuropathy, unspecified: Secondary | ICD-10-CM | POA: Diagnosis not present

## 2018-04-29 DIAGNOSIS — M47816 Spondylosis without myelopathy or radiculopathy, lumbar region: Secondary | ICD-10-CM | POA: Diagnosis not present

## 2018-04-29 DIAGNOSIS — I10 Essential (primary) hypertension: Secondary | ICD-10-CM | POA: Diagnosis not present

## 2018-04-29 DIAGNOSIS — M47812 Spondylosis without myelopathy or radiculopathy, cervical region: Secondary | ICD-10-CM | POA: Diagnosis not present

## 2018-04-29 DIAGNOSIS — G629 Polyneuropathy, unspecified: Secondary | ICD-10-CM | POA: Diagnosis not present

## 2018-04-29 DIAGNOSIS — M810 Age-related osteoporosis without current pathological fracture: Secondary | ICD-10-CM | POA: Diagnosis not present

## 2018-04-29 DIAGNOSIS — I69354 Hemiplegia and hemiparesis following cerebral infarction affecting left non-dominant side: Secondary | ICD-10-CM | POA: Diagnosis not present

## 2018-04-30 DIAGNOSIS — M47816 Spondylosis without myelopathy or radiculopathy, lumbar region: Secondary | ICD-10-CM | POA: Diagnosis not present

## 2018-04-30 DIAGNOSIS — I69354 Hemiplegia and hemiparesis following cerebral infarction affecting left non-dominant side: Secondary | ICD-10-CM | POA: Diagnosis not present

## 2018-04-30 DIAGNOSIS — M810 Age-related osteoporosis without current pathological fracture: Secondary | ICD-10-CM | POA: Diagnosis not present

## 2018-04-30 DIAGNOSIS — G629 Polyneuropathy, unspecified: Secondary | ICD-10-CM | POA: Diagnosis not present

## 2018-04-30 DIAGNOSIS — M47812 Spondylosis without myelopathy or radiculopathy, cervical region: Secondary | ICD-10-CM | POA: Diagnosis not present

## 2018-04-30 DIAGNOSIS — I10 Essential (primary) hypertension: Secondary | ICD-10-CM | POA: Diagnosis not present

## 2018-04-30 DIAGNOSIS — H401132 Primary open-angle glaucoma, bilateral, moderate stage: Secondary | ICD-10-CM | POA: Diagnosis not present

## 2018-05-01 DIAGNOSIS — G629 Polyneuropathy, unspecified: Secondary | ICD-10-CM | POA: Diagnosis not present

## 2018-05-01 DIAGNOSIS — M47816 Spondylosis without myelopathy or radiculopathy, lumbar region: Secondary | ICD-10-CM | POA: Diagnosis not present

## 2018-05-01 DIAGNOSIS — M47812 Spondylosis without myelopathy or radiculopathy, cervical region: Secondary | ICD-10-CM | POA: Diagnosis not present

## 2018-05-01 DIAGNOSIS — M810 Age-related osteoporosis without current pathological fracture: Secondary | ICD-10-CM | POA: Diagnosis not present

## 2018-05-01 DIAGNOSIS — I69354 Hemiplegia and hemiparesis following cerebral infarction affecting left non-dominant side: Secondary | ICD-10-CM | POA: Diagnosis not present

## 2018-05-01 DIAGNOSIS — I10 Essential (primary) hypertension: Secondary | ICD-10-CM | POA: Diagnosis not present

## 2018-05-04 DIAGNOSIS — I10 Essential (primary) hypertension: Secondary | ICD-10-CM | POA: Diagnosis not present

## 2018-05-04 DIAGNOSIS — M47816 Spondylosis without myelopathy or radiculopathy, lumbar region: Secondary | ICD-10-CM | POA: Diagnosis not present

## 2018-05-04 DIAGNOSIS — G629 Polyneuropathy, unspecified: Secondary | ICD-10-CM | POA: Diagnosis not present

## 2018-05-04 DIAGNOSIS — M47812 Spondylosis without myelopathy or radiculopathy, cervical region: Secondary | ICD-10-CM | POA: Diagnosis not present

## 2018-05-04 DIAGNOSIS — I69354 Hemiplegia and hemiparesis following cerebral infarction affecting left non-dominant side: Secondary | ICD-10-CM | POA: Diagnosis not present

## 2018-05-04 DIAGNOSIS — M810 Age-related osteoporosis without current pathological fracture: Secondary | ICD-10-CM | POA: Diagnosis not present

## 2018-05-05 DIAGNOSIS — I69354 Hemiplegia and hemiparesis following cerebral infarction affecting left non-dominant side: Secondary | ICD-10-CM | POA: Diagnosis not present

## 2018-05-05 DIAGNOSIS — G629 Polyneuropathy, unspecified: Secondary | ICD-10-CM | POA: Diagnosis not present

## 2018-05-05 DIAGNOSIS — M47816 Spondylosis without myelopathy or radiculopathy, lumbar region: Secondary | ICD-10-CM | POA: Diagnosis not present

## 2018-05-05 DIAGNOSIS — M47812 Spondylosis without myelopathy or radiculopathy, cervical region: Secondary | ICD-10-CM | POA: Diagnosis not present

## 2018-05-05 DIAGNOSIS — I10 Essential (primary) hypertension: Secondary | ICD-10-CM | POA: Diagnosis not present

## 2018-05-05 DIAGNOSIS — M810 Age-related osteoporosis without current pathological fracture: Secondary | ICD-10-CM | POA: Diagnosis not present

## 2018-05-07 DIAGNOSIS — I69354 Hemiplegia and hemiparesis following cerebral infarction affecting left non-dominant side: Secondary | ICD-10-CM | POA: Diagnosis not present

## 2018-05-07 DIAGNOSIS — M810 Age-related osteoporosis without current pathological fracture: Secondary | ICD-10-CM | POA: Diagnosis not present

## 2018-05-07 DIAGNOSIS — M47812 Spondylosis without myelopathy or radiculopathy, cervical region: Secondary | ICD-10-CM | POA: Diagnosis not present

## 2018-05-07 DIAGNOSIS — G629 Polyneuropathy, unspecified: Secondary | ICD-10-CM | POA: Diagnosis not present

## 2018-05-07 DIAGNOSIS — M47816 Spondylosis without myelopathy or radiculopathy, lumbar region: Secondary | ICD-10-CM | POA: Diagnosis not present

## 2018-05-07 DIAGNOSIS — I10 Essential (primary) hypertension: Secondary | ICD-10-CM | POA: Diagnosis not present

## 2018-05-08 DIAGNOSIS — M47816 Spondylosis without myelopathy or radiculopathy, lumbar region: Secondary | ICD-10-CM | POA: Diagnosis not present

## 2018-05-08 DIAGNOSIS — M810 Age-related osteoporosis without current pathological fracture: Secondary | ICD-10-CM | POA: Diagnosis not present

## 2018-05-08 DIAGNOSIS — G629 Polyneuropathy, unspecified: Secondary | ICD-10-CM | POA: Diagnosis not present

## 2018-05-08 DIAGNOSIS — M47812 Spondylosis without myelopathy or radiculopathy, cervical region: Secondary | ICD-10-CM | POA: Diagnosis not present

## 2018-05-08 DIAGNOSIS — I69354 Hemiplegia and hemiparesis following cerebral infarction affecting left non-dominant side: Secondary | ICD-10-CM | POA: Diagnosis not present

## 2018-05-08 DIAGNOSIS — I10 Essential (primary) hypertension: Secondary | ICD-10-CM | POA: Diagnosis not present

## 2018-05-11 DIAGNOSIS — M47812 Spondylosis without myelopathy or radiculopathy, cervical region: Secondary | ICD-10-CM | POA: Diagnosis not present

## 2018-05-11 DIAGNOSIS — M810 Age-related osteoporosis without current pathological fracture: Secondary | ICD-10-CM | POA: Diagnosis not present

## 2018-05-11 DIAGNOSIS — I69354 Hemiplegia and hemiparesis following cerebral infarction affecting left non-dominant side: Secondary | ICD-10-CM | POA: Diagnosis not present

## 2018-05-11 DIAGNOSIS — G629 Polyneuropathy, unspecified: Secondary | ICD-10-CM | POA: Diagnosis not present

## 2018-05-11 DIAGNOSIS — M47816 Spondylosis without myelopathy or radiculopathy, lumbar region: Secondary | ICD-10-CM | POA: Diagnosis not present

## 2018-05-11 DIAGNOSIS — I10 Essential (primary) hypertension: Secondary | ICD-10-CM | POA: Diagnosis not present

## 2018-05-12 DIAGNOSIS — M47812 Spondylosis without myelopathy or radiculopathy, cervical region: Secondary | ICD-10-CM | POA: Diagnosis not present

## 2018-05-12 DIAGNOSIS — M47816 Spondylosis without myelopathy or radiculopathy, lumbar region: Secondary | ICD-10-CM | POA: Diagnosis not present

## 2018-05-12 DIAGNOSIS — G629 Polyneuropathy, unspecified: Secondary | ICD-10-CM | POA: Diagnosis not present

## 2018-05-12 DIAGNOSIS — M810 Age-related osteoporosis without current pathological fracture: Secondary | ICD-10-CM | POA: Diagnosis not present

## 2018-05-12 DIAGNOSIS — I69354 Hemiplegia and hemiparesis following cerebral infarction affecting left non-dominant side: Secondary | ICD-10-CM | POA: Diagnosis not present

## 2018-05-12 DIAGNOSIS — I10 Essential (primary) hypertension: Secondary | ICD-10-CM | POA: Diagnosis not present

## 2018-05-14 DIAGNOSIS — M810 Age-related osteoporosis without current pathological fracture: Secondary | ICD-10-CM | POA: Diagnosis not present

## 2018-05-14 DIAGNOSIS — M47816 Spondylosis without myelopathy or radiculopathy, lumbar region: Secondary | ICD-10-CM | POA: Diagnosis not present

## 2018-05-14 DIAGNOSIS — I69354 Hemiplegia and hemiparesis following cerebral infarction affecting left non-dominant side: Secondary | ICD-10-CM | POA: Diagnosis not present

## 2018-05-14 DIAGNOSIS — I10 Essential (primary) hypertension: Secondary | ICD-10-CM | POA: Diagnosis not present

## 2018-05-14 DIAGNOSIS — M47812 Spondylosis without myelopathy or radiculopathy, cervical region: Secondary | ICD-10-CM | POA: Diagnosis not present

## 2018-05-14 DIAGNOSIS — G629 Polyneuropathy, unspecified: Secondary | ICD-10-CM | POA: Diagnosis not present

## 2018-05-15 DIAGNOSIS — M47812 Spondylosis without myelopathy or radiculopathy, cervical region: Secondary | ICD-10-CM | POA: Diagnosis not present

## 2018-05-15 DIAGNOSIS — M810 Age-related osteoporosis without current pathological fracture: Secondary | ICD-10-CM | POA: Diagnosis not present

## 2018-05-15 DIAGNOSIS — I69354 Hemiplegia and hemiparesis following cerebral infarction affecting left non-dominant side: Secondary | ICD-10-CM | POA: Diagnosis not present

## 2018-05-15 DIAGNOSIS — M47816 Spondylosis without myelopathy or radiculopathy, lumbar region: Secondary | ICD-10-CM | POA: Diagnosis not present

## 2018-05-15 DIAGNOSIS — I10 Essential (primary) hypertension: Secondary | ICD-10-CM | POA: Diagnosis not present

## 2018-05-15 DIAGNOSIS — G629 Polyneuropathy, unspecified: Secondary | ICD-10-CM | POA: Diagnosis not present

## 2018-05-18 DIAGNOSIS — I69354 Hemiplegia and hemiparesis following cerebral infarction affecting left non-dominant side: Secondary | ICD-10-CM | POA: Diagnosis not present

## 2018-05-18 DIAGNOSIS — M47816 Spondylosis without myelopathy or radiculopathy, lumbar region: Secondary | ICD-10-CM | POA: Diagnosis not present

## 2018-05-18 DIAGNOSIS — G629 Polyneuropathy, unspecified: Secondary | ICD-10-CM | POA: Diagnosis not present

## 2018-05-18 DIAGNOSIS — I10 Essential (primary) hypertension: Secondary | ICD-10-CM | POA: Diagnosis not present

## 2018-05-18 DIAGNOSIS — M810 Age-related osteoporosis without current pathological fracture: Secondary | ICD-10-CM | POA: Diagnosis not present

## 2018-05-18 DIAGNOSIS — M47812 Spondylosis without myelopathy or radiculopathy, cervical region: Secondary | ICD-10-CM | POA: Diagnosis not present

## 2018-05-19 DIAGNOSIS — M810 Age-related osteoporosis without current pathological fracture: Secondary | ICD-10-CM | POA: Diagnosis not present

## 2018-05-19 DIAGNOSIS — M47812 Spondylosis without myelopathy or radiculopathy, cervical region: Secondary | ICD-10-CM | POA: Diagnosis not present

## 2018-05-19 DIAGNOSIS — G629 Polyneuropathy, unspecified: Secondary | ICD-10-CM | POA: Diagnosis not present

## 2018-05-19 DIAGNOSIS — I10 Essential (primary) hypertension: Secondary | ICD-10-CM | POA: Diagnosis not present

## 2018-05-19 DIAGNOSIS — I69354 Hemiplegia and hemiparesis following cerebral infarction affecting left non-dominant side: Secondary | ICD-10-CM | POA: Diagnosis not present

## 2018-05-19 DIAGNOSIS — M47816 Spondylosis without myelopathy or radiculopathy, lumbar region: Secondary | ICD-10-CM | POA: Diagnosis not present

## 2018-05-20 DIAGNOSIS — I69354 Hemiplegia and hemiparesis following cerebral infarction affecting left non-dominant side: Secondary | ICD-10-CM | POA: Diagnosis not present

## 2018-05-20 DIAGNOSIS — I10 Essential (primary) hypertension: Secondary | ICD-10-CM | POA: Diagnosis not present

## 2018-05-20 DIAGNOSIS — M47816 Spondylosis without myelopathy or radiculopathy, lumbar region: Secondary | ICD-10-CM | POA: Diagnosis not present

## 2018-05-20 DIAGNOSIS — G629 Polyneuropathy, unspecified: Secondary | ICD-10-CM | POA: Diagnosis not present

## 2018-05-20 DIAGNOSIS — M47812 Spondylosis without myelopathy or radiculopathy, cervical region: Secondary | ICD-10-CM | POA: Diagnosis not present

## 2018-05-20 DIAGNOSIS — M810 Age-related osteoporosis without current pathological fracture: Secondary | ICD-10-CM | POA: Diagnosis not present

## 2018-05-21 DIAGNOSIS — M47816 Spondylosis without myelopathy or radiculopathy, lumbar region: Secondary | ICD-10-CM | POA: Diagnosis not present

## 2018-05-21 DIAGNOSIS — M47812 Spondylosis without myelopathy or radiculopathy, cervical region: Secondary | ICD-10-CM | POA: Diagnosis not present

## 2018-05-21 DIAGNOSIS — I10 Essential (primary) hypertension: Secondary | ICD-10-CM | POA: Diagnosis not present

## 2018-05-21 DIAGNOSIS — M810 Age-related osteoporosis without current pathological fracture: Secondary | ICD-10-CM | POA: Diagnosis not present

## 2018-05-21 DIAGNOSIS — I69354 Hemiplegia and hemiparesis following cerebral infarction affecting left non-dominant side: Secondary | ICD-10-CM | POA: Diagnosis not present

## 2018-05-21 DIAGNOSIS — G629 Polyneuropathy, unspecified: Secondary | ICD-10-CM | POA: Diagnosis not present

## 2018-05-27 DIAGNOSIS — M47816 Spondylosis without myelopathy or radiculopathy, lumbar region: Secondary | ICD-10-CM | POA: Diagnosis not present

## 2018-05-27 DIAGNOSIS — G629 Polyneuropathy, unspecified: Secondary | ICD-10-CM | POA: Diagnosis not present

## 2018-05-27 DIAGNOSIS — M47812 Spondylosis without myelopathy or radiculopathy, cervical region: Secondary | ICD-10-CM | POA: Diagnosis not present

## 2018-05-27 DIAGNOSIS — M810 Age-related osteoporosis without current pathological fracture: Secondary | ICD-10-CM | POA: Diagnosis not present

## 2018-05-27 DIAGNOSIS — I10 Essential (primary) hypertension: Secondary | ICD-10-CM | POA: Diagnosis not present

## 2018-05-27 DIAGNOSIS — I69354 Hemiplegia and hemiparesis following cerebral infarction affecting left non-dominant side: Secondary | ICD-10-CM | POA: Diagnosis not present

## 2018-05-28 DIAGNOSIS — M47816 Spondylosis without myelopathy or radiculopathy, lumbar region: Secondary | ICD-10-CM | POA: Diagnosis not present

## 2018-05-28 DIAGNOSIS — G629 Polyneuropathy, unspecified: Secondary | ICD-10-CM | POA: Diagnosis not present

## 2018-05-28 DIAGNOSIS — I69354 Hemiplegia and hemiparesis following cerebral infarction affecting left non-dominant side: Secondary | ICD-10-CM | POA: Diagnosis not present

## 2018-05-28 DIAGNOSIS — M810 Age-related osteoporosis without current pathological fracture: Secondary | ICD-10-CM | POA: Diagnosis not present

## 2018-05-28 DIAGNOSIS — M47812 Spondylosis without myelopathy or radiculopathy, cervical region: Secondary | ICD-10-CM | POA: Diagnosis not present

## 2018-05-28 DIAGNOSIS — I10 Essential (primary) hypertension: Secondary | ICD-10-CM | POA: Diagnosis not present

## 2018-05-29 DIAGNOSIS — G629 Polyneuropathy, unspecified: Secondary | ICD-10-CM | POA: Diagnosis not present

## 2018-05-29 DIAGNOSIS — M47816 Spondylosis without myelopathy or radiculopathy, lumbar region: Secondary | ICD-10-CM | POA: Diagnosis not present

## 2018-05-29 DIAGNOSIS — M47812 Spondylosis without myelopathy or radiculopathy, cervical region: Secondary | ICD-10-CM | POA: Diagnosis not present

## 2018-05-29 DIAGNOSIS — M810 Age-related osteoporosis without current pathological fracture: Secondary | ICD-10-CM | POA: Diagnosis not present

## 2018-05-29 DIAGNOSIS — I69354 Hemiplegia and hemiparesis following cerebral infarction affecting left non-dominant side: Secondary | ICD-10-CM | POA: Diagnosis not present

## 2018-05-29 DIAGNOSIS — I10 Essential (primary) hypertension: Secondary | ICD-10-CM | POA: Diagnosis not present

## 2018-12-29 DIAGNOSIS — I639 Cerebral infarction, unspecified: Secondary | ICD-10-CM | POA: Diagnosis not present

## 2018-12-29 DIAGNOSIS — M810 Age-related osteoporosis without current pathological fracture: Secondary | ICD-10-CM | POA: Diagnosis not present

## 2018-12-29 DIAGNOSIS — G890 Central pain syndrome: Secondary | ICD-10-CM | POA: Diagnosis not present

## 2018-12-29 DIAGNOSIS — F322 Major depressive disorder, single episode, severe without psychotic features: Secondary | ICD-10-CM | POA: Diagnosis not present

## 2018-12-29 DIAGNOSIS — Z1389 Encounter for screening for other disorder: Secondary | ICD-10-CM | POA: Diagnosis not present

## 2018-12-29 DIAGNOSIS — E782 Mixed hyperlipidemia: Secondary | ICD-10-CM | POA: Diagnosis not present

## 2018-12-29 DIAGNOSIS — Z79899 Other long term (current) drug therapy: Secondary | ICD-10-CM | POA: Diagnosis not present

## 2018-12-29 DIAGNOSIS — K219 Gastro-esophageal reflux disease without esophagitis: Secondary | ICD-10-CM | POA: Diagnosis not present

## 2018-12-29 DIAGNOSIS — I10 Essential (primary) hypertension: Secondary | ICD-10-CM | POA: Diagnosis not present

## 2018-12-29 DIAGNOSIS — I4891 Unspecified atrial fibrillation: Secondary | ICD-10-CM | POA: Diagnosis not present

## 2018-12-29 DIAGNOSIS — Z0001 Encounter for general adult medical examination with abnormal findings: Secondary | ICD-10-CM | POA: Diagnosis not present

## 2018-12-29 DIAGNOSIS — I69354 Hemiplegia and hemiparesis following cerebral infarction affecting left non-dominant side: Secondary | ICD-10-CM | POA: Diagnosis not present

## 2019-09-20 DIAGNOSIS — H401132 Primary open-angle glaucoma, bilateral, moderate stage: Secondary | ICD-10-CM | POA: Diagnosis not present

## 2020-01-26 DIAGNOSIS — E039 Hypothyroidism, unspecified: Secondary | ICD-10-CM | POA: Diagnosis not present

## 2020-01-26 DIAGNOSIS — I4891 Unspecified atrial fibrillation: Secondary | ICD-10-CM | POA: Diagnosis not present

## 2020-01-26 DIAGNOSIS — I69359 Hemiplegia and hemiparesis following cerebral infarction affecting unspecified side: Secondary | ICD-10-CM | POA: Diagnosis not present

## 2020-01-26 DIAGNOSIS — Z0001 Encounter for general adult medical examination with abnormal findings: Secondary | ICD-10-CM | POA: Diagnosis not present

## 2020-01-26 DIAGNOSIS — F330 Major depressive disorder, recurrent, mild: Secondary | ICD-10-CM | POA: Diagnosis not present

## 2020-01-26 DIAGNOSIS — I10 Essential (primary) hypertension: Secondary | ICD-10-CM | POA: Diagnosis not present

## 2020-01-26 DIAGNOSIS — E782 Mixed hyperlipidemia: Secondary | ICD-10-CM | POA: Diagnosis not present

## 2020-01-26 DIAGNOSIS — D6869 Other thrombophilia: Secondary | ICD-10-CM | POA: Diagnosis not present

## 2020-01-26 DIAGNOSIS — Z79899 Other long term (current) drug therapy: Secondary | ICD-10-CM | POA: Diagnosis not present

## 2020-01-26 DIAGNOSIS — D649 Anemia, unspecified: Secondary | ICD-10-CM | POA: Diagnosis not present

## 2020-01-26 DIAGNOSIS — G8194 Hemiplegia, unspecified affecting left nondominant side: Secondary | ICD-10-CM | POA: Diagnosis not present

## 2020-05-04 DIAGNOSIS — H401132 Primary open-angle glaucoma, bilateral, moderate stage: Secondary | ICD-10-CM | POA: Diagnosis not present

## 2020-08-28 DIAGNOSIS — Z79899 Other long term (current) drug therapy: Secondary | ICD-10-CM | POA: Diagnosis not present

## 2020-08-28 DIAGNOSIS — I10 Essential (primary) hypertension: Secondary | ICD-10-CM | POA: Diagnosis not present

## 2020-08-28 DIAGNOSIS — G8194 Hemiplegia, unspecified affecting left nondominant side: Secondary | ICD-10-CM | POA: Diagnosis not present

## 2020-08-28 DIAGNOSIS — E782 Mixed hyperlipidemia: Secondary | ICD-10-CM | POA: Diagnosis not present

## 2020-08-28 DIAGNOSIS — E559 Vitamin D deficiency, unspecified: Secondary | ICD-10-CM | POA: Diagnosis not present

## 2020-08-28 DIAGNOSIS — R3915 Urgency of urination: Secondary | ICD-10-CM | POA: Diagnosis not present

## 2020-08-28 DIAGNOSIS — D649 Anemia, unspecified: Secondary | ICD-10-CM | POA: Diagnosis not present

## 2020-08-28 DIAGNOSIS — F330 Major depressive disorder, recurrent, mild: Secondary | ICD-10-CM | POA: Diagnosis not present

## 2020-08-28 DIAGNOSIS — E039 Hypothyroidism, unspecified: Secondary | ICD-10-CM | POA: Diagnosis not present

## 2020-08-28 DIAGNOSIS — D6869 Other thrombophilia: Secondary | ICD-10-CM | POA: Diagnosis not present

## 2020-08-28 DIAGNOSIS — H6123 Impacted cerumen, bilateral: Secondary | ICD-10-CM | POA: Diagnosis not present

## 2020-08-28 DIAGNOSIS — I4891 Unspecified atrial fibrillation: Secondary | ICD-10-CM | POA: Diagnosis not present

## 2020-11-06 DIAGNOSIS — H401132 Primary open-angle glaucoma, bilateral, moderate stage: Secondary | ICD-10-CM | POA: Diagnosis not present

## 2021-05-04 DIAGNOSIS — Z79899 Other long term (current) drug therapy: Secondary | ICD-10-CM | POA: Diagnosis not present

## 2021-05-04 DIAGNOSIS — F341 Dysthymic disorder: Secondary | ICD-10-CM | POA: Diagnosis not present

## 2021-05-04 DIAGNOSIS — I10 Essential (primary) hypertension: Secondary | ICD-10-CM | POA: Diagnosis not present

## 2021-05-04 DIAGNOSIS — G8194 Hemiplegia, unspecified affecting left nondominant side: Secondary | ICD-10-CM | POA: Diagnosis not present

## 2021-05-04 DIAGNOSIS — I69359 Hemiplegia and hemiparesis following cerebral infarction affecting unspecified side: Secondary | ICD-10-CM | POA: Diagnosis not present

## 2021-05-04 DIAGNOSIS — E538 Deficiency of other specified B group vitamins: Secondary | ICD-10-CM | POA: Diagnosis not present

## 2021-05-04 DIAGNOSIS — Z23 Encounter for immunization: Secondary | ICD-10-CM | POA: Diagnosis not present

## 2021-05-04 DIAGNOSIS — Z0000 Encounter for general adult medical examination without abnormal findings: Secondary | ICD-10-CM | POA: Diagnosis not present

## 2021-05-04 DIAGNOSIS — D649 Anemia, unspecified: Secondary | ICD-10-CM | POA: Diagnosis not present

## 2021-05-04 DIAGNOSIS — E782 Mixed hyperlipidemia: Secondary | ICD-10-CM | POA: Diagnosis not present

## 2021-05-04 DIAGNOSIS — E039 Hypothyroidism, unspecified: Secondary | ICD-10-CM | POA: Diagnosis not present

## 2021-05-04 DIAGNOSIS — I69328 Other speech and language deficits following cerebral infarction: Secondary | ICD-10-CM | POA: Diagnosis not present

## 2021-05-04 DIAGNOSIS — E559 Vitamin D deficiency, unspecified: Secondary | ICD-10-CM | POA: Diagnosis not present

## 2021-06-25 DIAGNOSIS — R051 Acute cough: Secondary | ICD-10-CM | POA: Diagnosis not present

## 2021-06-25 DIAGNOSIS — Z03818 Encounter for observation for suspected exposure to other biological agents ruled out: Secondary | ICD-10-CM | POA: Diagnosis not present

## 2021-08-13 DIAGNOSIS — H401132 Primary open-angle glaucoma, bilateral, moderate stage: Secondary | ICD-10-CM | POA: Diagnosis not present

## 2021-11-01 DIAGNOSIS — D649 Anemia, unspecified: Secondary | ICD-10-CM | POA: Diagnosis not present

## 2021-11-01 DIAGNOSIS — E039 Hypothyroidism, unspecified: Secondary | ICD-10-CM | POA: Diagnosis not present

## 2021-11-01 DIAGNOSIS — I69328 Other speech and language deficits following cerebral infarction: Secondary | ICD-10-CM | POA: Diagnosis not present

## 2021-11-01 DIAGNOSIS — I4891 Unspecified atrial fibrillation: Secondary | ICD-10-CM | POA: Diagnosis not present

## 2021-11-01 DIAGNOSIS — Z79899 Other long term (current) drug therapy: Secondary | ICD-10-CM | POA: Diagnosis not present

## 2021-11-01 DIAGNOSIS — D6869 Other thrombophilia: Secondary | ICD-10-CM | POA: Diagnosis not present

## 2021-11-01 DIAGNOSIS — I69359 Hemiplegia and hemiparesis following cerebral infarction affecting unspecified side: Secondary | ICD-10-CM | POA: Diagnosis not present

## 2021-11-01 DIAGNOSIS — M62838 Other muscle spasm: Secondary | ICD-10-CM | POA: Diagnosis not present

## 2021-11-01 DIAGNOSIS — E782 Mixed hyperlipidemia: Secondary | ICD-10-CM | POA: Diagnosis not present

## 2021-11-01 DIAGNOSIS — E559 Vitamin D deficiency, unspecified: Secondary | ICD-10-CM | POA: Diagnosis not present

## 2021-11-01 DIAGNOSIS — G8194 Hemiplegia, unspecified affecting left nondominant side: Secondary | ICD-10-CM | POA: Diagnosis not present

## 2021-11-01 DIAGNOSIS — I10 Essential (primary) hypertension: Secondary | ICD-10-CM | POA: Diagnosis not present

## 2022-05-27 DIAGNOSIS — E039 Hypothyroidism, unspecified: Secondary | ICD-10-CM | POA: Diagnosis not present

## 2022-05-27 DIAGNOSIS — I69328 Other speech and language deficits following cerebral infarction: Secondary | ICD-10-CM | POA: Diagnosis not present

## 2022-05-27 DIAGNOSIS — Z1331 Encounter for screening for depression: Secondary | ICD-10-CM | POA: Diagnosis not present

## 2022-05-27 DIAGNOSIS — I10 Essential (primary) hypertension: Secondary | ICD-10-CM | POA: Diagnosis not present

## 2022-05-27 DIAGNOSIS — I69359 Hemiplegia and hemiparesis following cerebral infarction affecting unspecified side: Secondary | ICD-10-CM | POA: Diagnosis not present

## 2022-05-27 DIAGNOSIS — Z79899 Other long term (current) drug therapy: Secondary | ICD-10-CM | POA: Diagnosis not present

## 2022-05-27 DIAGNOSIS — Z0000 Encounter for general adult medical examination without abnormal findings: Secondary | ICD-10-CM | POA: Diagnosis not present

## 2022-05-27 DIAGNOSIS — J45998 Other asthma: Secondary | ICD-10-CM | POA: Diagnosis not present

## 2022-05-27 DIAGNOSIS — I4891 Unspecified atrial fibrillation: Secondary | ICD-10-CM | POA: Diagnosis not present

## 2022-05-27 DIAGNOSIS — E782 Mixed hyperlipidemia: Secondary | ICD-10-CM | POA: Diagnosis not present

## 2022-05-27 DIAGNOSIS — F341 Dysthymic disorder: Secondary | ICD-10-CM | POA: Diagnosis not present

## 2022-05-27 DIAGNOSIS — E559 Vitamin D deficiency, unspecified: Secondary | ICD-10-CM | POA: Diagnosis not present

## 2022-05-27 DIAGNOSIS — G8194 Hemiplegia, unspecified affecting left nondominant side: Secondary | ICD-10-CM | POA: Diagnosis not present

## 2022-05-27 DIAGNOSIS — D649 Anemia, unspecified: Secondary | ICD-10-CM | POA: Diagnosis not present

## 2022-05-27 DIAGNOSIS — E538 Deficiency of other specified B group vitamins: Secondary | ICD-10-CM | POA: Diagnosis not present

## 2022-10-04 ENCOUNTER — Emergency Department (HOSPITAL_COMMUNITY): Payer: Medicare Other

## 2022-10-04 ENCOUNTER — Observation Stay (HOSPITAL_COMMUNITY)
Admission: EM | Admit: 2022-10-04 | Discharge: 2022-10-05 | Disposition: A | Discharge status: Home / Self Care | Payer: Medicare Other | Attending: Internal Medicine | Admitting: Internal Medicine

## 2022-10-04 ENCOUNTER — Encounter (HOSPITAL_COMMUNITY): Payer: Self-pay

## 2022-10-04 DIAGNOSIS — X58XXXA Exposure to other specified factors, initial encounter: Secondary | ICD-10-CM | POA: Insufficient documentation

## 2022-10-04 DIAGNOSIS — Z8673 Personal history of transient ischemic attack (TIA), and cerebral infarction without residual deficits: Secondary | ICD-10-CM

## 2022-10-04 DIAGNOSIS — I10 Essential (primary) hypertension: Secondary | ICD-10-CM | POA: Diagnosis present

## 2022-10-04 DIAGNOSIS — R400 Somnolence: Secondary | ICD-10-CM | POA: Insufficient documentation

## 2022-10-04 DIAGNOSIS — N390 Urinary tract infection, site not specified: Secondary | ICD-10-CM | POA: Diagnosis not present

## 2022-10-04 DIAGNOSIS — R600 Localized edema: Secondary | ICD-10-CM | POA: Diagnosis not present

## 2022-10-04 DIAGNOSIS — D649 Anemia, unspecified: Secondary | ICD-10-CM

## 2022-10-04 DIAGNOSIS — S82832A Other fracture of upper and lower end of left fibula, initial encounter for closed fracture: Secondary | ICD-10-CM | POA: Diagnosis not present

## 2022-10-04 DIAGNOSIS — R918 Other nonspecific abnormal finding of lung field: Secondary | ICD-10-CM | POA: Diagnosis not present

## 2022-10-04 DIAGNOSIS — S82232A Displaced oblique fracture of shaft of left tibia, initial encounter for closed fracture: Secondary | ICD-10-CM

## 2022-10-04 DIAGNOSIS — E785 Hyperlipidemia, unspecified: Secondary | ICD-10-CM | POA: Diagnosis present

## 2022-10-04 DIAGNOSIS — M25572 Pain in left ankle and joints of left foot: Secondary | ICD-10-CM | POA: Diagnosis present

## 2022-10-04 DIAGNOSIS — I480 Paroxysmal atrial fibrillation: Secondary | ICD-10-CM | POA: Diagnosis not present

## 2022-10-04 DIAGNOSIS — Z79899 Other long term (current) drug therapy: Secondary | ICD-10-CM | POA: Diagnosis not present

## 2022-10-04 DIAGNOSIS — Z7901 Long term (current) use of anticoagulants: Secondary | ICD-10-CM | POA: Insufficient documentation

## 2022-10-04 DIAGNOSIS — Z7982 Long term (current) use of aspirin: Secondary | ICD-10-CM | POA: Insufficient documentation

## 2022-10-04 DIAGNOSIS — S89392A Other physeal fracture of lower end of left fibula, initial encounter for closed fracture: Secondary | ICD-10-CM | POA: Insufficient documentation

## 2022-10-04 DIAGNOSIS — S82202A Unspecified fracture of shaft of left tibia, initial encounter for closed fracture: Secondary | ICD-10-CM

## 2022-10-04 DIAGNOSIS — E039 Hypothyroidism, unspecified: Secondary | ICD-10-CM | POA: Insufficient documentation

## 2022-10-04 DIAGNOSIS — S82302A Unspecified fracture of lower end of left tibia, initial encounter for closed fracture: Secondary | ICD-10-CM | POA: Diagnosis not present

## 2022-10-04 DIAGNOSIS — M79605 Pain in left leg: Secondary | ICD-10-CM | POA: Diagnosis not present

## 2022-10-04 DIAGNOSIS — R9431 Abnormal electrocardiogram [ECG] [EKG]: Secondary | ICD-10-CM | POA: Diagnosis not present

## 2022-10-04 LAB — COMPREHENSIVE METABOLIC PANEL
ALT: 12 U/L (ref 0–44)
AST: 15 U/L (ref 15–41)
Albumin: 3.4 g/dL — ABNORMAL LOW (ref 3.5–5.0)
Alkaline Phosphatase: 60 U/L (ref 38–126)
Anion gap: 7 (ref 5–15)
BUN: 26 mg/dL — ABNORMAL HIGH (ref 8–23)
CO2: 27 mmol/L (ref 22–32)
Calcium: 8.9 mg/dL (ref 8.9–10.3)
Chloride: 102 mmol/L (ref 98–111)
Creatinine, Ser: 0.96 mg/dL (ref 0.44–1.00)
GFR, Estimated: 56 mL/min — ABNORMAL LOW (ref >60)
Glucose, Bld: 98 mg/dL (ref 70–99)
Potassium: 4 mmol/L (ref 3.5–5.1)
Sodium: 136 mmol/L (ref 135–145)
Total Bilirubin: 0.5 mg/dL (ref 0.3–1.2)
Total Protein: 6.8 g/dL (ref 6.5–8.1)

## 2022-10-04 LAB — CBC WITH DIFFERENTIAL/PLATELET
Abs Immature Granulocytes: 0.04 10*3/uL (ref 0.00–0.07)
Basophils Absolute: 0.1 10*3/uL (ref 0.0–0.1)
Basophils Relative: 1 %
Eosinophils Absolute: 0.4 10*3/uL (ref 0.0–0.5)
Eosinophils Relative: 5 %
HCT: 34.1 % — ABNORMAL LOW (ref 36.0–46.0)
Hemoglobin: 10.7 g/dL — ABNORMAL LOW (ref 12.0–15.0)
Immature Granulocytes: 1 %
Lymphocytes Relative: 26 %
Lymphs Abs: 1.9 10*3/uL (ref 0.7–4.0)
MCH: 28.2 pg (ref 26.0–34.0)
MCHC: 31.4 g/dL (ref 30.0–36.0)
MCV: 89.7 fL (ref 80.0–100.0)
Monocytes Absolute: 0.8 10*3/uL (ref 0.1–1.0)
Monocytes Relative: 10 %
Neutro Abs: 4.4 10*3/uL (ref 1.7–7.7)
Neutrophils Relative %: 57 %
Platelets: 353 10*3/uL (ref 150–400)
RBC: 3.8 MIL/uL — ABNORMAL LOW (ref 3.87–5.11)
RDW: 15.3 % (ref 11.5–15.5)
WBC: 7.6 10*3/uL (ref 4.0–10.5)
nRBC: 0 % (ref 0.0–0.2)

## 2022-10-04 LAB — URINALYSIS, W/ REFLEX TO CULTURE (INFECTION SUSPECTED)
Bilirubin Urine: NEGATIVE
Glucose, UA: NEGATIVE mg/dL
Ketones, ur: 5 mg/dL — AB
Nitrite: POSITIVE — AB
Protein, ur: >=300 mg/dL — AB
RBC / HPF: >50 RBC/hpf (ref 0–5)
Specific Gravity, Urine: 1.019 (ref 1.005–1.030)
WBC, UA: >50 WBC/hpf (ref 0–5)
pH: 5 (ref 5.0–8.0)

## 2022-10-04 MED ORDER — HYDROMORPHONE HCL 1 MG/ML IJ SOLN
0.5000 mg | Freq: Once | INTRAMUSCULAR | Status: AC | PRN
  Administered 2022-10-04: 0.5 mg via INTRAVENOUS
  Filled 2022-10-04: qty 1

## 2022-10-04 MED ORDER — SODIUM CHLORIDE 0.9 % IV BOLUS
500.0000 mL | Freq: Once | INTRAVENOUS | Status: AC
  Administered 2022-10-04: 500 mL via INTRAVENOUS

## 2022-10-04 MED ORDER — HYDROMORPHONE HCL 1 MG/ML IJ SOLN
0.5000 mg | Freq: Once | INTRAMUSCULAR | Status: AC | PRN
  Administered 2022-10-04: 0.25 mg via INTRAVENOUS
  Filled 2022-10-04: qty 1

## 2022-10-04 MED ORDER — SODIUM CHLORIDE 0.9 % IV SOLN: Freq: Once | INTRAVENOUS | Status: AC

## 2022-10-04 MED ORDER — FENTANYL CITRATE PF 50 MCG/ML IJ SOSY: 12.5000 ug | PREFILLED_SYRINGE | INTRAMUSCULAR | Status: DC | PRN

## 2022-10-04 MED ORDER — ACETAMINOPHEN 650 MG RE SUPP: 650.0000 mg | Freq: Four times a day (QID) | RECTAL | Status: DC | PRN

## 2022-10-04 MED ORDER — ACETAMINOPHEN 325 MG PO TABS
650.0000 mg | ORAL_TABLET | Freq: Four times a day (QID) | ORAL | Status: DC | PRN
  Administered 2022-10-05: 650 mg via ORAL
  Filled 2022-10-04: qty 2

## 2022-10-04 MED ORDER — NALOXONE HCL 0.4 MG/ML IJ SOLN: 0.4000 mg | INTRAMUSCULAR | Status: DC | PRN

## 2022-10-04 MED ORDER — SODIUM CHLORIDE 0.9 % IV SOLN
1.0000 g | Freq: Once | INTRAVENOUS | Status: AC
  Administered 2022-10-04: 1 g via INTRAVENOUS
  Filled 2022-10-04: qty 10

## 2022-10-04 MED ORDER — SODIUM CHLORIDE 0.9 % IV SOLN
1.0000 g | INTRAVENOUS | Status: DC
  Filled 2022-10-04: qty 10

## 2022-10-04 NOTE — ED Provider Notes (Signed)
[name] Provider Note   CSN: [id number] Arrival date & time: 10/04/22  1548     History  Chief Complaint  Patient presents with   Ankle Pain    Angela Keith is a 89 y.o. female.  HPI 89 year old female presents with left lower leg fracture. Patient originally had her injury 2-3 days ago. She uses a lift bar and it seems like her leg got caught up 1 time.  The daughter states that the patient's been getting progressively weaker for quite some time.  She has been nonambulatory besides to pivot for several years.  There has been no dramatic worsening or new unilateral weakness but the patient does seem to be progressively getting weaker.  Due to the pain she has been in, especially at night, patient took her to [facility] today where they did x-rays and noted that she had a distal tib/fib fracture.  The way the daughter explains it, it was in a fracture pattern that they were worried about her circulation though at this point they stated that her circulation was okay.  They have applied a splint and she drove POV here at their recommendation to come to [facility] or [facility] for surgery. Patient is not able to provide any significant history due to her chronic mental status and hard of hearing.  Home Medications Prior to Admission medications   Medication Sig Start Date End Date Taking? Authorizing Provider  acetaminophen (TYLENOL) 325 MG tablet Take 650 mg by mouth every 6 (six) hours as needed for mild pain or moderate pain.    [provider]  alendronate (FOSAMAX) 70 MG tablet Take 70 mg by mouth once a week. 02/11/18   [provider]  apixaban (ELIQUIS) 5 MG TABS tablet Take 5 mg by mouth 2 (two) times daily.     [provider]  aspirin EC 81 MG EC tablet Take 1 tablet (81 mg total) by mouth daily. 08/09/16   Donne Hazel, MD  baclofen (LIORESAL) 10 MG tablet Take 5 mg by mouth 2 (two) times  daily as needed for muscle spasms.    [provider]  dorzolamide-timolol (COSOPT) 22.3-6.8 MG/ML ophthalmic solution Place 1 drop into both eyes 2 (two) times daily.    [provider]  gabapentin (NEURONTIN) 100 MG capsule Take 100 mg by mouth 2 (two) times daily. At breakfast and lunch     [provider]  HYDROcodone-acetaminophen (NORCO/VICODIN) 5-325 MG tablet Take 1 tablet by mouth 2 (two) times daily as needed for moderate pain or severe pain. 08/09/16   Donne Hazel, MD  latanoprost (XALATAN) 0.005 % ophthalmic solution Place 1 drop into both eyes at bedtime.    [provider]  levothyroxine (SYNTHROID, LEVOTHROID) 88 MCG tablet Take 88 mcg by mouth daily before breakfast.    [provider]  lidocaine (LIDODERM) 5 % Place 1 patch onto the skin daily. Left lateral thigh  Remove & Discard patch within 12 hours or as directed by MD    [provider]  metoprolol tartrate (LOPRESSOR) 25 MG tablet Take 25 mg by mouth 2 (two) times daily.    [provider]  mirtazapine (REMERON) 15 MG tablet Take 15 mg by mouth at bedtime.    [provider]  nitroGLYCERIN (NITROSTAT) 0.4 MG SL tablet Take 0.4 mg sublingual tablet under the tongue as needed for chest pressure 01/14/17   Evans Lance, MD  NUTRITIONAL SUPPLEMENTS PO Take Medpass 4 oz by mouth 2 times daily to support nutritional needs    [provider]  pantoprazole (PROTONIX) 40 MG tablet Take 40 mg by mouth 2 (two) times daily.     [provider]  PARoxetine (PAXIL-CR) 12.5 MG 24 hr tablet Take 1 tablet by mouth daily. 12/03/17   [provider]  pravastatin (PRAVACHOL) 20 MG tablet Take 1 tablet (20 mg total) by mouth daily at 6 PM. 08/09/16   Donne Hazel, MD  QUEtiapine (SEROQUEL) 25 MG tablet Take 50 mg by mouth at bedtime.     [provider]  tamsulosin (FLOMAX) 0.4 MG CAPS capsule TAKE 1 CAPSULE BY MOUTH ONCE DAILY 01/13/17    Blanchie Serve, MD  vitamin B-12 (CYANOCOBALAMIN) 1000 MCG tablet Take 1,000 mcg by mouth daily.     [provider]      Allergies    Ace inhibitors, Lipitor [atorvastatin], and Motrin [ibuprofen]    Review of Systems   Review of Systems  Unable to perform ROS: Mental status change    Physical Exam Updated Vital Signs BP (!) 95/53   Pulse 68   Temp 97.6 F (36.4 C) (Oral)   Resp 11   Ht 5\' 5"  (1.651 m)   Wt 58.1 kg   SpO2 98%   BMI 21.30 kg/m  Physical Exam Vitals and nursing note reviewed.  Constitutional:      Appearance: She is well-developed.  HENT:     Head: Normocephalic and atraumatic.  Pulmonary:     Effort: Pulmonary effort is normal.  Abdominal:     General: There is no distension.     Palpations: Abdomen is soft.     Tenderness: There is no abdominal tenderness.  Musculoskeletal:     Comments: Patient's left lower leg is in a splint. There appears to be some swelling/bruising proximal to the splint. There appears to be a deformity.  Skin:    General: Skin is warm and dry.  Neurological:     Mental Status: She is alert.     Comments: Patient has equal strength/grip strength in BUE.      ED Results / Procedures / Treatments   Labs (all labs ordered are listed, but only abnormal results are displayed) Labs Reviewed  URINALYSIS, W/ REFLEX TO CULTURE (INFECTION SUSPECTED) - Abnormal; Notable for the following components:      Result Value   APPearance TURBID (*)    Hgb urine dipstick SMALL (*)    Ketones, ur 5 (*)    Protein, ur >=300 (*)    Nitrite POSITIVE (*)    Leukocytes,Ua SMALL (*)    Bacteria, UA MANY (*)    All other components within normal limits  CBC WITH DIFFERENTIAL/PLATELET - Abnormal; Notable for the following components:   RBC 3.80 (*)    Hemoglobin 10.7 (*)    HCT 34.1 (*)    All other components within normal limits  COMPREHENSIVE METABOLIC PANEL - Abnormal; Notable for the following components:   BUN 26 (*)     Albumin 3.4 (*)    GFR, Estimated 56 (*)    All other components within normal limits  URINE CULTURE    EKG EKG Interpretation  Date/Time:  Friday October 04 2022 16:59:15 EDT Ventricular Rate:  64 PR Interval:  216 QRS Duration: 130 QT Interval:  494 QTC Calculation: 510 R Axis:   50 Text Interpretation: Sinus rhythm Borderline prolonged PR interval Right bundle branch block  Poor data quality in current ECG precludes serial comparison otherwise seems similar to Jan 2018 Confirmed by Sherwood Gambler [phone]) on 10/04/2022 6:02:49 PM  Radiology DG Tibia/Fibula Left  Result Date: 10/04/2022 CLINICAL DATA:  Postreduction EXAM: LEFT TIBIA AND FIBULA - 2 VIEW COMPARISON:  10/04/2022 FINDINGS: In cast views demonstrate decreased displacement and angulation at the distal tibia and fibular shaft fractures. Mild angulation and displacement remain. Proximal fibular shaft fracture also noted with minimal displacement. IMPRESSION: Decreasing displacement across the distal tibia and fibular fractures with continued mild displacement and angulation. Minimally displaced proximal fibular shaft fracture. Electronically Signed   By: Rolm Baptise M.D.   On: 10/04/2022 20:11   DG Chest Portable 1 View  Result Date: 10/04/2022 CLINICAL DATA:  Left tibia and fibula fractures require surgical intervention EXAM: PORTABLE CHEST 1 VIEW COMPARISON:  Chest radiograph dated 08/06/2016 FINDINGS: Implanted loop recorder projects over the left lower chest. Normal lung volumes. No focal consolidations. Retrocardiac lucency. Unchanged blunting of the left costophrenic angle. No pneumothorax. The heart size and mediastinal contours are within normal limits. Aortic atherosclerosis. The visualized skeletal structures are unremarkable. Bilateral breast implants. IMPRESSION: 1. No acute cardiopulmonary findings. 2. Unchanged blunting of the left costophrenic angle, which may represent a small pleural effusion versus pleural  thickening. 3. Retrocardiac lucency likely represents hiatal hernia. 4.  Aortic Atherosclerosis (ICD10-I70.0). Electronically Signed   By: Darrin Nipper M.D.   On: 10/04/2022 17:30   DG Tibia/Fibula Left  Result Date: 10/04/2022 CLINICAL DATA:  [place], pain EXAM: LEFT TIBIA AND FIBULA - 2 VIEW COMPARISON:  None Available. FINDINGS: Frontal and lateral views of the left tibia and fibula are obtained. There are displaced oblique fractures of the distal left tibia and fibular diaphyses, with slight valgus and severe dorsal angulation at the fracture site. Bones are osteopenic. Alignment of the left knee and ankle appears anatomic. There is diffuse soft tissue edema. IMPRESSION: 1. Displaced distal left tibial and fibular diaphyseal fractures, with valgus and dorsal angulation at the fracture site. 2. Diffuse soft tissue edema. 3. Osteopenia. Electronically Signed   By: Randa Ngo M.D.   On: 10/04/2022 17:25    Procedures Procedures    Medications Ordered in ED Medications  HYDROmorphone (DILAUDID) injection 0.5 mg (0.5 mg Intravenous Given 10/04/22 1929)  HYDROmorphone (DILAUDID) injection 0.5 mg (0.25 mg Intravenous Given 10/04/22 1936)  cefTRIAXone (ROCEPHIN) 1 g in sodium chloride 0.9 % 100 mL IVPB (0 g Intravenous Stopped 10/04/22 2147)  0.9 %  sodium chloride infusion ( Intravenous New Bag/Given 10/04/22 2152)    ED Course/ Medical Decision Making/ A&P                             Medical Decision Making Amount and/or Complexity of Data Reviewed Labs: ordered.    Details: Normal WBC.  Urinalysis consistent with UTI with positive nitrites and WBC clumps. Radiology: ordered and independent interpretation performed.    Details: Displaced and angulated distal tib/fib fracture.  Risk Prescription drug management. Decision regarding hospitalization.   I had originally consulted [facility] as asked where she was sent from an [place] though they have deferred to the on-call orthopedist.  I  discussed with Dr. Marlou Sa who is evaluated patient.  We took down her splint and she has a closed injury with intact pulse.  He has reduced the fracture.  Given she is nonambulatory at baseline this would not be a surgical fracture.  From his perspective  she is cleared for discharge.  However the patient has been weak and also is a little sedated from the dose of Dilaudid she received.  She also has a UTI and has been weaker than normal.  Family would like to keep her in the hospital overnight for observation.  I think this is reasonable.  She was given a dose of IV Rocephin and some fluids.  I discussed with Dr. Marlowe Sax, who will admit.   Of note, I had discussed doing a CT head with the daughter but the daughter does not want to do this.  She notes that she feels like the patient is getting to a palliative care type of point and she would not want [place] to do any change in management depending on what we find on the head CT.  I think that is reasonable.        Final Clinical Impression(s) / ED Diagnoses Final diagnoses:  Closed fracture of left tibia and fibula, initial encounter  Acute urinary tract infection    Rx / DC Orders ED Discharge Orders     None         Sherwood Gambler, MD 10/04/22 2322

## 2022-10-04 NOTE — ED Notes (Signed)
Pain meds given prior to changing splint out. Ortho at bedside

## 2022-10-04 NOTE — Progress Notes (Signed)
Orthopedic Tech Progress Note Patient Details:  [name] 1929-09-10 [id number]  Ortho Devices Type of Ortho Device: Stirrup splint, Short leg splint Ortho Device/Splint Location: LLE Ortho Device/Splint Interventions: Application   Post Interventions Patient Tolerated: Well  Angela Keith Angela Keith 10/04/2022, 7:54 PM

## 2022-10-04 NOTE — ED Notes (Signed)
Left pedal pulse found with doppler, significant swelling noted in lower extremity. Pt splint ACE wrap changed due to it being wet

## 2022-10-04 NOTE — Consult Note (Signed)
Reason for Consult: Left tib-fib fracture Referring Physician: Dr. [name] is an 89 y.o. female.  HPI: Angela Keith is a 89 year old female who is nonambulatory.  She had a stroke affecting the left-hand side.  She was in some type of lift chair on Tuesday when an event occurred which caused her left leg to become bruised.  Her daughter noticed the bruising and she went to an urgent care at [facility] today.  They splinted the leg and sent her for further treatment here at [facility] emergency department.  Patient is on Eliquis.  Past Medical History:  Diagnosis Date   Arthritis    Atrial fibrillation (HCC)    Chronic neck pain    DJD (degenerative joint disease), cervical    DJD (degenerative joint disease), lumbar    Dysphagia    GERD (gastroesophageal reflux disease)    Glaucoma    HTN (hypertension) 10/30/2012   Hyperlipidemia    Hypothyroidism    Insomnia    Nocturia    Right bundle branch block 03/21/2014   Rotoscoliosis    Severe hearing loss    Shortness of breath dyspnea    Stroke [facility])    Unspecified hypothyroidism 10/30/2012    Past Surgical History:  Procedure Laterality Date   BACK SURGERY     lower back    bladder laser surgery     BREAST SURGERY     cataract surgery     EP IMPLANTABLE DEVICE N/A 01/19/2015   Procedure: Loop Recorder Insertion;  Surgeon: Evans Lance, MD;  Location: [place] CV LAB;  Service: Cardiovascular;  Laterality: N/A;   ESOPHAGOGASTRODUODENOSCOPY (EGD) WITH ESOPHAGEAL DILATION  06/08/2012   Procedure: ESOPHAGOGASTRODUODENOSCOPY (EGD) WITH ESOPHAGEAL DILATION;  Surgeon: Garlan Fair, MD;  Location: [facility] ENDOSCOPY;  Service: Endoscopy;  Laterality: N/A;   facail surgery  2000   facelift and eyelid lift   left carpal tunnel surgery     LUMBAR LAMINECTOMY/DECOMPRESSION MICRODISCECTOMY N/A 01/17/2015   Procedure: Laminectomy and Foraminotomy - L2-L3 - L3-L4;  Surgeon: Earnie Larsson, MD;  Location: [facility] NEURO ORS;  Service:  Neurosurgery;  Laterality: N/A;  Laminectomy and Foraminotomy - L2-L3 - L3-L4   TEE WITHOUT CARDIOVERSION N/A 01/19/2015   Procedure: TRANSESOPHAGEAL ECHOCARDIOGRAM (TEE);  Surgeon: Pixie Casino, MD;  Location: [facility] ENDOSCOPY;  Service: Cardiovascular;  Laterality: N/A;    Family History  Problem Relation Age of Onset   CAD Father    Stroke Father    Diabetes Mother    Lung cancer Other    CAD Brother     Social History:  reports that she has never smoked. She has never used smokeless tobacco. She reports that she does not drink alcohol and does not use drugs.  Allergies:  Allergies  Allergen Reactions   Ace Inhibitors Other (See Comments)    Severe fatigue   Lipitor [Atorvastatin] Other (See Comments)    Mood swings   Motrin [Ibuprofen] Nausea And Vomiting    Medications: I have reviewed the patient's current medications.  Results for orders placed or performed during the hospital encounter of 10/04/22 (from the past 48 hour(s))  Urinalysis, w/ Reflex to Culture (Infection Suspected) -Urine, Clean Catch     Status: Abnormal   Collection Time: 10/04/22  4:47 PM  Result Value Ref Range   Specimen Source URINE, CLEAN CATCH    Color, Urine YELLOW YELLOW   APPearance TURBID (A) CLEAR   Specific Gravity, Urine 1.019 1.005 - 1.030   pH  5.0 5.0 - 8.0   Glucose, UA NEGATIVE NEGATIVE mg/dL   Hgb urine dipstick SMALL (A) NEGATIVE   Bilirubin Urine NEGATIVE NEGATIVE   Ketones, ur 5 (A) NEGATIVE mg/dL   Protein, ur >=300 (A) NEGATIVE mg/dL   Nitrite POSITIVE (A) NEGATIVE   Leukocytes,Ua SMALL (A) NEGATIVE   RBC / HPF >50 0 - 5 RBC/hpf   WBC, UA >50 0 - 5 WBC/hpf    Comment:        Reflex urine culture not performed if WBC <=10, OR if Squamous epithelial cells >5. If Squamous epithelial cells >5 suggest recollection.    Bacteria, UA MANY (A) NONE SEEN   Squamous Epithelial / HPF 0-5 0 - 5 /HPF   WBC Clumps PRESENT    Mucus PRESENT     Comment: Performed at [facility], [place] [address] [place], [place] [address]  CBC with Differential     Status: Abnormal   Collection Time: 10/04/22  5:30 PM  Result Value Ref Range   WBC 7.6 4.0 - 10.5 K/uL   RBC 3.80 (L) 3.87 - 5.11 MIL/uL   Hemoglobin 10.7 (L) 12.0 - 15.0 g/dL   HCT 34.1 (L) 36.0 - 46.0 %   MCV 89.7 80.0 - 100.0 fL   MCH 28.2 26.0 - 34.0 pg   MCHC 31.4 30.0 - 36.0 g/dL   RDW 15.3 11.5 - 15.5 %   Platelets 353 150 - 400 K/uL   nRBC 0.0 0.0 - 0.2 %   Neutrophils Relative % 57 %   Neutro Abs 4.4 1.7 - 7.7 K/uL   Lymphocytes Relative 26 %   Lymphs Abs 1.9 0.7 - 4.0 K/uL   Monocytes Relative 10 %   Monocytes Absolute 0.8 0.1 - 1.0 K/uL   Eosinophils Relative 5 %   Eosinophils Absolute 0.4 0.0 - 0.5 K/uL   Basophils Relative 1 %   Basophils Absolute 0.1 0.0 - 0.1 K/uL   Immature Granulocytes 1 %   Abs Immature Granulocytes 0.04 0.00 - 0.07 K/uL    Comment: Performed at [facility], [place] [address], [place], [place] [address]  Comprehensive metabolic panel     Status: Abnormal   Collection Time: 10/04/22  5:30 PM  Result Value Ref Range   Sodium 136 135 - 145 mmol/L   Potassium 4.0 3.5 - 5.1 mmol/L   Chloride 102 98 - 111 mmol/L   CO2 27 22 - 32 mmol/L   Glucose, Bld 98 70 - 99 mg/dL    Comment: Glucose reference range applies only to samples taken after fasting for at least 8 hours.   BUN 26 (H) 8 - 23 mg/dL   Creatinine, Ser 0.96 0.44 - 1.00 mg/dL   Calcium 8.9 8.9 - 10.3 mg/dL   Total Protein 6.8 6.5 - 8.1 g/dL   Albumin 3.4 (L) 3.5 - 5.0 g/dL   AST 15 15 - 41 U/L   ALT 12 0 - 44 U/L   Alkaline Phosphatase 60 38 - 126 U/L   Total Bilirubin 0.5 0.3 - 1.2 mg/dL   GFR, Estimated 56 (L) >60 mL/min    Comment: (NOTE) Calculated using the CKD-EPI Creatinine Equation (2021)    Anion gap 7 5 - 15    Comment: Performed at [facility], [place] [address], [place], [place] [address]    DG Chest Portable 1 View  Result Date:  10/04/2022 CLINICAL DATA:  Left tibia and fibula fractures require surgical intervention EXAM:  PORTABLE CHEST 1 VIEW COMPARISON:  Chest radiograph dated 08/06/2016 FINDINGS: Implanted loop recorder projects over the left lower chest. Normal lung volumes. No focal consolidations. Retrocardiac lucency. Unchanged blunting of the left costophrenic angle. No pneumothorax. The heart size and mediastinal contours are within normal limits. Aortic atherosclerosis. The visualized skeletal structures are unremarkable. Bilateral breast implants. IMPRESSION: 1. No acute cardiopulmonary findings. 2. Unchanged blunting of the left costophrenic angle, which may represent a small pleural effusion versus pleural thickening. 3. Retrocardiac lucency likely represents hiatal hernia. 4.  Aortic Atherosclerosis (ICD10-I70.0). Electronically Signed   By: Darrin Nipper M.D.   On: 10/04/2022 17:30   DG Tibia/Fibula Left  Result Date: 10/04/2022 CLINICAL DATA:  [place], pain EXAM: LEFT TIBIA AND FIBULA - 2 VIEW COMPARISON:  None Available. FINDINGS: Frontal and lateral views of the left tibia and fibula are obtained. There are displaced oblique fractures of the distal left tibia and fibular diaphyses, with slight valgus and severe dorsal angulation at the fracture site. Bones are osteopenic. Alignment of the left knee and ankle appears anatomic. There is diffuse soft tissue edema. IMPRESSION: 1. Displaced distal left tibial and fibular diaphyseal fractures, with valgus and dorsal angulation at the fracture site. 2. Diffuse soft tissue edema. 3. Osteopenia. Electronically Signed   By: Randa Ngo M.D.   On: 10/04/2022 17:25    Review of Systems  Reason unable to perform ROS: History of stroke and is nonverbal.   Blood pressure 133/78, pulse 71, temperature 98.9 F (37.2 C), resp. rate 11, height 5\' 5"  (1.651 m), weight 58.1 kg, SpO2 97 %. Physical Exam Vitals reviewed.  HENT:     Head: Normocephalic.     Nose: Nose normal.      Mouth/Throat:     Mouth: Mucous membranes are moist.  Eyes:     Pupils: Pupils are equal, round, and reactive to light.  Cardiovascular:     Rate and Rhythm: Normal rate.     Pulses: Normal pulses.  Pulmonary:     Effort: Pulmonary effort is normal.  Abdominal:     General: Abdomen is flat.  Musculoskeletal:     Cervical back: Normal range of motion.  Skin:    General: Skin is warm.     Capillary Refill: Capillary refill takes less than 2 seconds.  Neurological:     Mental Status: She is alert. Mental status is at baseline.  Psychiatric:        Mood and Affect: Mood normal.   Patient has no crepitus or bruising in the upper extremities wrist elbow or shoulder.  Right lower extremity intact.  Left hip has some early pressure ulceration.  Left leg splint is removed.  Patient's sock remains in place.  This is also removed.  The skin is fully inspected and does have bruising but the skin is intact.  The compartments are soft.  Foot is perfused on the left-hand side.  Angulation is present and fracture mobility is noted. Assessment/Plan: Impression is left tib-fib fracture in a nonambulatory patient.  Plan is to improve the alignment of the fracture which is done today.  Splint is removed and with some sedation the fracture alignment is improved manually and resplinted.  The patient has somewhat of a flexion contracture in the left leg so we are not able to apply a long-leg splint.  Post reduction radiographs are pending.  Overall I discussed with the daughter that the fracture alignment does not have to be perfect in this patient who is nonambulatory  if we can improve it I think that would be better for her long-term.  She will follow-up with [place] in 7 days for splint removal and short leg cast application.  G Scott Selda Jalbert 10/04/2022, 7:59 PM

## 2022-10-04 NOTE — ED Notes (Signed)
X-ray at bedside

## 2022-10-04 NOTE — ED Notes (Signed)
Pt placed on 2L  for low O2 after pain medication

## 2022-10-04 NOTE — ED Notes (Signed)
Patient saw Dr. Garlan Fillers in their [place] office today, [facility].

## 2022-10-04 NOTE — ED Notes (Signed)
V/o from Dr. Marlou Sa Orthopaedic surgeon to administer more dilaudid for splint application. Pt having pain during placement

## 2022-10-04 NOTE — ED Triage Notes (Signed)
Pt arrives today with complaints of left ankle pain. Pt was seen earlier at [facility], family believes that pt had tib/fib fx with "jagged edges"- no imaging available nor read. Pt was told they will need surgery. Family states pt has hx of stroke and left sided deficits. Pt attempts to stand up using a bar in the house and best guess is that she rolled that ankle in attempt to stand. Pt has been grabbing leg in pain x 3 days.

## 2022-10-04 NOTE — H&P (Signed)
History and Physical    Angela Keith [id number] DOB: 11/11/1929 DOA: 10/04/2022  PCP: Josetta Huddle, MD  Patient coming from: Home  Chief Complaint: Ankle pain  HPI: Angela Keith is a 89 y.o. female with medical history significant of paroxysmal A-fib on Eliquis, hypertension, hyperlipidemia, hypothyroidism, GERD, stroke in 2016 with residual left-sided deficits and nonambulatory, lumbar spinal stenosis with neurogenic claudication sent to the ED from orthopedic office after imaging revealed left distal tib/fib fracture.  She was not hypoxic initially but oxygen saturation dropped to the high 80s after she was given IV Dilaudid for pain and subsequently placed on 2 L [place].  Also blood pressure was initially stable but after Dilaudid systolic dropped to the 0000000.  CBC showing no leukocytosis, hemoglobin 10.7, MCV 89.7, platelet count normal.  No significant abnormalities on CMP.  UA showing positive nitrite, small amount of leukocytes, and microscopy showing >50 RBCs, >50 WBCs, and many bacteria.  Urine culture pending.  Chest x-ray negative for acute finding.  X-ray of left tibia/fibula showing displaced distal left tibial and fibular diaphyseal fractures with valgus and dorsal angulation at the fracture site.  Patient was seen by orthopedics in the ED and fracture was reduced.  Repeat x-ray showing decreasing displacement across the distal tibia and fibular fractures with continued mild displacement and angulation and minimally displaced proximal fibular shaft fracture.  Orthopedics recommended outpatient follow-up in 7 days for splint removal and short leg cast application.  Patient was given ceftriaxone for UTI.  Family requested hospital admission due to patient feeling weak.  Patient is currently somnolent and not able to give any history.  She opens her eyes and falls asleep quickly.  Per daughter-in-law at bedside, patient was awake and alert earlier and became somnolent after she received  Dilaudid for pain.  States patient has been complaining of left ankle pain for the past 2 days.  Daughter-in-law is not sure how exactly the patient fractured her leg but thinks it might be related to using her lift chair.  States patient has left sided weakness due to history of prior stroke and whenever she tries to use a lift chair, her left leg starts to dangle and she thinks she might have twisted it.  She has not had any falls or head injury.  No cough, shortness of breath, vomiting, abdominal pain, or diarrhea.  Daughter-in-law is requesting hospital admission for observation as she is having difficulty taking care of the patient at home, however, does mention that the patient's son is not interested in nursing home placement.  Family is hoping that she could be watched overnight in the hospital and then discharged back home tomorrow.  Review of Systems:  Review of Systems  All other systems reviewed and are negative.   Past Medical History:  Diagnosis Date   Arthritis    Atrial fibrillation (HCC)    Chronic neck pain    DJD (degenerative joint disease), cervical    DJD (degenerative joint disease), lumbar    Dysphagia    GERD (gastroesophageal reflux disease)    Glaucoma    HTN (hypertension) 10/30/2012   Hyperlipidemia    Hypothyroidism    Insomnia    Nocturia    Right bundle branch block 03/21/2014   Rotoscoliosis    Severe hearing loss    Shortness of breath dyspnea    Stroke [facility])    Unspecified hypothyroidism 10/30/2012    Past Surgical History:  Procedure Laterality Date   BACK SURGERY  lower back    bladder laser surgery     BREAST SURGERY     cataract surgery     EP IMPLANTABLE DEVICE N/A 01/19/2015   Procedure: Loop Recorder Insertion;  Surgeon: Evans Lance, MD;  Location: [place] [facility];  Service: Cardiovascular;  Laterality: N/A;   ESOPHAGOGASTRODUODENOSCOPY (EGD) WITH ESOPHAGEAL DILATION  06/08/2012   Procedure: ESOPHAGOGASTRODUODENOSCOPY (EGD) WITH  ESOPHAGEAL DILATION;  Surgeon: Garlan Fair, MD;  Location: [facility] ENDOSCOPY;  Service: Endoscopy;  Laterality: N/A;   facail surgery  2000   facelift and eyelid lift   left carpal tunnel surgery     LUMBAR LAMINECTOMY/DECOMPRESSION MICRODISCECTOMY N/A 01/17/2015   Procedure: Laminectomy and Foraminotomy - L2-L3 - L3-L4;  Surgeon: Earnie Larsson, MD;  Location: [facility];  Service: Neurosurgery;  Laterality: N/A;  Laminectomy and Foraminotomy - L2-L3 - L3-L4   TEE WITHOUT CARDIOVERSION N/A 01/19/2015   Procedure: TRANSESOPHAGEAL ECHOCARDIOGRAM (TEE);  Surgeon: Pixie Casino, MD;  Location: [facility] ENDOSCOPY;  Service: Cardiovascular;  Laterality: N/A;     reports that she has never smoked. She has never used smokeless tobacco. She reports that she does not drink alcohol and does not use drugs.  Allergies  Allergen Reactions   Ace Inhibitors Other (See Comments)    Severe fatigue   Lipitor [Atorvastatin] Other (See Comments)    Mood swings   Motrin [Ibuprofen] Nausea And Vomiting    Family History  Problem Relation Age of Onset   CAD Father    Stroke Father    Diabetes Mother    Lung cancer Other    CAD Brother     Prior to Admission medications   Medication Sig Start Date End Date Taking? Authorizing Provider  acetaminophen (TYLENOL) 325 MG tablet Take 650 mg by mouth every 6 (six) hours as needed for mild pain or moderate pain.    [provider]  alendronate (FOSAMAX) 70 MG tablet Take 70 mg by mouth once a week. 02/11/18   [provider]  apixaban (ELIQUIS) 5 MG TABS tablet Take 5 mg by mouth 2 (two) times daily.     [provider]  aspirin EC 81 MG EC tablet Take 1 tablet (81 mg total) by mouth daily. 08/09/16   Donne Hazel, MD  baclofen (LIORESAL) 10 MG tablet Take 5 mg by mouth 2 (two) times daily as needed for muscle spasms.    [provider]  dorzolamide-timolol (COSOPT) 22.3-6.8 MG/ML ophthalmic solution Place 1 drop into both eyes 2  (two) times daily.    [provider]  gabapentin (NEURONTIN) 100 MG capsule Take 100 mg by mouth 2 (two) times daily. At breakfast and lunch     [provider]  HYDROcodone-acetaminophen (NORCO/VICODIN) 5-325 MG tablet Take 1 tablet by mouth 2 (two) times daily as needed for moderate pain or severe pain. 08/09/16   Donne Hazel, MD  latanoprost (XALATAN) 0.005 % ophthalmic solution Place 1 drop into both eyes at bedtime.    [provider]  levothyroxine (SYNTHROID, LEVOTHROID) 88 MCG tablet Take 88 mcg by mouth daily before breakfast.    [provider]  lidocaine (LIDODERM) 5 % Place 1 patch onto the skin daily. Left lateral thigh  Remove & Discard patch within 12 hours or as directed by MD    [provider]  metoprolol tartrate (LOPRESSOR) 25 MG tablet Take 25 mg by mouth 2 (two) times daily.    [provider]  mirtazapine (REMERON) 15  MG tablet Take 15 mg by mouth at bedtime.    [provider]  nitroGLYCERIN (NITROSTAT) 0.4 MG SL tablet Take 0.4 mg sublingual tablet under the tongue as needed for chest pressure 01/14/17   Evans Lance, MD  NUTRITIONAL SUPPLEMENTS PO Take Medpass 4 oz by mouth 2 times daily to support nutritional needs    [provider]  pantoprazole (PROTONIX) 40 MG tablet Take 40 mg by mouth 2 (two) times daily.     [provider]  PARoxetine (PAXIL-CR) 12.5 MG 24 hr tablet Take 1 tablet by mouth daily. 12/03/17   [provider]  pravastatin (PRAVACHOL) 20 MG tablet Take 1 tablet (20 mg total) by mouth daily at 6 PM. 08/09/16   Donne Hazel, MD  QUEtiapine (SEROQUEL) 25 MG tablet Take 50 mg by mouth at bedtime.     [provider]  tamsulosin (FLOMAX) 0.4 MG CAPS capsule TAKE 1 CAPSULE BY MOUTH ONCE DAILY 01/13/17   Blanchie Serve, MD  vitamin B-12 (CYANOCOBALAMIN) 1000 MCG tablet Take 1,000 mcg by mouth daily.     [provider]    Physical Exam: Vitals:    10/04/22 2000 10/04/22 2030 10/04/22 2047 10/04/22 2150  BP: 123/79 (!) 96/56  (!) 91/55  Pulse: 75 73  71  Resp: 13 11  10   Temp:   97.6 F (36.4 C)   TempSrc:   Oral   SpO2: 97% 98%  98%  Weight:      Height:        Physical Exam Vitals reviewed.  HENT:     Head: Normocephalic and atraumatic.  Eyes:     Comments: Unable to examine at this time due to lack of patient cooperation  Cardiovascular:     Rate and Rhythm: Normal rate and regular rhythm.     Pulses: Normal pulses.  Pulmonary:     Effort: Pulmonary effort is normal. No respiratory distress.     Breath sounds: Normal breath sounds. No wheezing or rales.  Abdominal:     General: Bowel sounds are normal. There is no distension.     Palpations: Abdomen is soft.     Tenderness: There is no abdominal tenderness.  Musculoskeletal:     Cervical back: Normal range of motion.     Comments: Left lower extremity in splint  Skin:    General: Skin is warm and dry.  Neurological:     Comments: Somnolent but arousable Moving bilateral upper extremities spontaneously     Labs on Admission: I have personally reviewed following labs and imaging studies  CBC: Recent Labs  Lab 10/04/22 1730  WBC 7.6  NEUTROABS 4.4  HGB 10.7*  HCT 34.1*  MCV 89.7  PLT 0000000   Basic Metabolic Panel: Recent Labs  Lab 10/04/22 1730  NA 136  K 4.0  CL 102  CO2 27  GLUCOSE 98  BUN 26*  CREATININE 0.96  CALCIUM 8.9   GFR: Estimated Creatinine Clearance: 35 mL/min (by C-G formula based on SCr of 0.96 mg/dL). Liver Function Tests: Recent Labs  Lab 10/04/22 1730  AST 15  ALT 12  ALKPHOS 60  BILITOT 0.5  PROT 6.8  ALBUMIN 3.4*   No results for input(s): "LIPASE", "AMYLASE" in the last 168 hours. No results for input(s): "AMMONIA" in the last 168 hours. Coagulation Profile: No results for input(s): "INR", "PROTIME" in the last 168 hours. Cardiac Enzymes: No results for input(s): "CKTOTAL", "CKMB", "CKMBINDEX", "TROPONINI" in  the last 168 hours.  BNP (last 3 results) No results for input(s): "PROBNP" in the last 8760 hours. HbA1C: No results for input(s): "HGBA1C" in the last 72 hours. CBG: No results for input(s): "GLUCAP" in the last 168 hours. Lipid Profile: No results for input(s): "CHOL", "HDL", "LDLCALC", "TRIG", "CHOLHDL", "LDLDIRECT" in the last 72 hours. Thyroid Function Tests: No results for input(s): "TSH", "T4TOTAL", "FREET4", "T3FREE", "THYROIDAB" in the last 72 hours. Anemia Panel: No results for input(s): "VITAMINB12", "FOLATE", "FERRITIN", "TIBC", "IRON", "RETICCTPCT" in the last 72 hours. Urine analysis:    Component Value Date/Time   COLORURINE YELLOW 10/04/2022 1647   APPEARANCEUR TURBID (A) 10/04/2022 1647   LABSPEC 1.019 10/04/2022 1647   PHURINE 5.0 10/04/2022 1647   GLUCOSEU NEGATIVE 10/04/2022 1647   HGBUR SMALL (A) 10/04/2022 1647   BILIRUBINUR NEGATIVE 10/04/2022 1647   KETONESUR 5 (A) 10/04/2022 1647   PROTEINUR >=300 (A) 10/04/2022 1647   UROBILINOGEN 1.0 01/25/2015 0547   NITRITE POSITIVE (A) 10/04/2022 1647   LEUKOCYTESUR SMALL (A) 10/04/2022 1647    Radiological Exams on Admission: DG Tibia/Fibula Left  Result Date: 10/04/2022 CLINICAL DATA:  Postreduction EXAM: LEFT TIBIA AND FIBULA - 2 VIEW COMPARISON:  10/04/2022 FINDINGS: In cast views demonstrate decreased displacement and angulation at the distal tibia and fibular shaft fractures. Mild angulation and displacement remain. Proximal fibular shaft fracture also noted with minimal displacement. IMPRESSION: Decreasing displacement across the distal tibia and fibular fractures with continued mild displacement and angulation. Minimally displaced proximal fibular shaft fracture. Electronically Signed   By: Rolm Baptise M.D.   On: 10/04/2022 20:11   DG Chest Portable 1 View  Result Date: 10/04/2022 CLINICAL DATA:  Left tibia and fibula fractures require surgical intervention EXAM: PORTABLE CHEST 1 VIEW COMPARISON:  Chest  radiograph dated 08/06/2016 FINDINGS: Implanted loop recorder projects over the left lower chest. Normal lung volumes. No focal consolidations. Retrocardiac lucency. Unchanged blunting of the left costophrenic angle. No pneumothorax. The heart size and mediastinal contours are within normal limits. Aortic atherosclerosis. The visualized skeletal structures are unremarkable. Bilateral breast implants. IMPRESSION: 1. No acute cardiopulmonary findings. 2. Unchanged blunting of the left costophrenic angle, which may represent a small pleural effusion versus pleural thickening. 3. Retrocardiac lucency likely represents hiatal hernia. 4.  Aortic Atherosclerosis (ICD10-I70.0). Electronically Signed   By: Darrin Nipper M.D.   On: 10/04/2022 17:30   DG Tibia/Fibula Left  Result Date: 10/04/2022 CLINICAL DATA:  [place], pain EXAM: LEFT TIBIA AND FIBULA - 2 VIEW COMPARISON:  None Available. FINDINGS: Frontal and lateral views of the left tibia and fibula are obtained. There are displaced oblique fractures of the distal left tibia and fibular diaphyses, with slight valgus and severe dorsal angulation at the fracture site. Bones are osteopenic. Alignment of the left knee and ankle appears anatomic. There is diffuse soft tissue edema. IMPRESSION: 1. Displaced distal left tibial and fibular diaphyseal fractures, with valgus and dorsal angulation at the fracture site. 2. Diffuse soft tissue edema. 3. Osteopenia. Electronically Signed   By: Randa Ngo M.D.   On: 10/04/2022 17:25    EKG: Independently reviewed.  Sinus rhythm with first-degree AV block, baseline wander in inferior leads, RBBB is not new, QTc 510.  Assessment and Plan  Displaced distal left tibial and fibular diaphyseal fractures Patient is nonambulatory at baseline and uses a lift chair and had some type of an event causing her to injure her leg.  X-ray showing displaced distal left tibial and fibular diaphyseal fractures with valgus and dorsal angulation at  the fracture site.  Patient was seen by orthopedics in the ED and fracture was reduced.  Repeat x-ray showing decreasing displacement across the distal tibia and fibular fractures with continued mild displacement and angulation and minimally displaced proximal fibular shaft fracture.  Orthopedics felt that fracture alignment does not have to be perfect in this patient who is nonambulatory and recommended outpatient follow-up in 7 days for splint removal and short leg cast application.  Continue pain management.  Somnolence Per daughter-in-law, patient was earlier awake and alert but became somnolent after receiving opiates for pain.  Discussed obtaining a head CT as the patient is on Eliquis and also has history of prior stroke but daughter-in-law does not want any head imaging done at this time.  She takes care of the patient at home and does not report any falls or head injury.  Patient is currently somnolent but arousable.  She is moving her upper extremities spontaneously.  Will continue to monitor closely.  Hold home Eliquis until her mental status improves.  UTI UA with signs of infection.  No fever, leukocytosis, or signs of sepsis.  Continue ceftriaxone.  Urine culture pending.  QT prolongation QTc 510.  Monitor potassium and magnesium levels, replace if low.  Repeat EKG in a.m. and avoid QT prolonging drugs.  Normocytic anemia Hemoglobin 10.7, no recent labs for comparison.  Hemoglobin was ranging between 12-14 back in 2018.  No signs of active bleeding, continue to monitor CBC.  Hypertension Hold antihypertensives at this time due to soft blood pressure.  Give gentle IV fluid hydration and monitor blood pressure.  Paroxysmal A-fib: Hold Eliquis until her mental status improves. Hyperlipidemia Hypothyroidism GERD Pharmacy med rec pending.  DVT prophylaxis: She is chronically anticoagulated with Eliquis.  Holding at this time until her mental status improves, family declining head  imaging at this time. Code Status: DNR/DNI (discussed with the patient's daughter-in-law) Family Communication: Daughter-in-law at bedside. Level of care: Telemetry bed Admission status: It is my clinical opinion that referral for OBSERVATION is reasonable and necessary in this patient based on the above information provided. The aforementioned taken together are felt to place the patient at high risk for further clinical deterioration. However, it is anticipated that the patient may be medically stable for discharge from the hospital within 24 to 48 hours.   Shela Leff MD [facility]  If 7PM-7AM, please contact night-coverage [email or web address]  10/04/2022, 10:35 PM

## 2022-10-05 ENCOUNTER — Other Ambulatory Visit: Payer: Self-pay

## 2022-10-05 DIAGNOSIS — S82232A Displaced oblique fracture of shaft of left tibia, initial encounter for closed fracture: Secondary | ICD-10-CM | POA: Diagnosis not present

## 2022-10-05 DIAGNOSIS — S82302A Unspecified fracture of lower end of left tibia, initial encounter for closed fracture: Secondary | ICD-10-CM | POA: Diagnosis not present

## 2022-10-05 DIAGNOSIS — R404 Transient alteration of awareness: Secondary | ICD-10-CM | POA: Diagnosis not present

## 2022-10-05 DIAGNOSIS — Z7401 Bed confinement status: Secondary | ICD-10-CM | POA: Diagnosis not present

## 2022-10-05 LAB — MAGNESIUM: Magnesium: 2.2 mg/dL (ref 1.7–2.4)

## 2022-10-05 LAB — CBC
HCT: 33 % — ABNORMAL LOW (ref 36.0–46.0)
Hemoglobin: 10 g/dL — ABNORMAL LOW (ref 12.0–15.0)
MCH: 27.6 pg (ref 26.0–34.0)
MCHC: 30.3 g/dL (ref 30.0–36.0)
MCV: 91.2 fL (ref 80.0–100.0)
Platelets: 318 10*3/uL (ref 150–400)
RBC: 3.62 MIL/uL — ABNORMAL LOW (ref 3.87–5.11)
RDW: 15.5 % (ref 11.5–15.5)
WBC: 7.5 10*3/uL (ref 4.0–10.5)
nRBC: 0 % (ref 0.0–0.2)

## 2022-10-05 LAB — BASIC METABOLIC PANEL
Anion gap: 5 (ref 5–15)
BUN: 23 mg/dL (ref 8–23)
CO2: 28 mmol/L (ref 22–32)
Calcium: 8.6 mg/dL — ABNORMAL LOW (ref 8.9–10.3)
Chloride: 106 mmol/L (ref 98–111)
Creatinine, Ser: 0.81 mg/dL (ref 0.44–1.00)
GFR, Estimated: >60 mL/min (ref >60)
Glucose, Bld: 80 mg/dL (ref 70–99)
Potassium: 5 mmol/L (ref 3.5–5.1)
Sodium: 139 mmol/L (ref 135–145)

## 2022-10-05 MED ORDER — CEFADROXIL 500 MG PO CAPS: 1000.0000 mg | ORAL_CAPSULE | Freq: Two times a day (BID) | ORAL | 0 refills | Status: DC

## 2022-10-05 MED ORDER — CEFADROXIL 500 MG PO CAPS: 1000.0000 mg | ORAL_CAPSULE | Freq: Two times a day (BID) | ORAL | 0 refills | Status: AC

## 2022-10-05 MED ORDER — HYDROCODONE-ACETAMINOPHEN 5-325 MG PO TABS: 1.0000 | ORAL_TABLET | Freq: Four times a day (QID) | ORAL | 0 refills | Status: DC | PRN

## 2022-10-05 MED ORDER — HYDROCODONE-ACETAMINOPHEN 5-325 MG PO TABS: 1.0000 | ORAL_TABLET | Freq: Four times a day (QID) | ORAL | 0 refills | Status: AC | PRN

## 2022-10-05 NOTE — TOC Transition Note (Signed)
Transition of Care [facility]) - CM/SW Discharge Note   Patient Details  Name: Angela Keith MRN: [id number] Date of Birth: 11-30-1929  [facility]) CM/SW Contact:  Rodney Booze, LCSW Phone Number: 10/05/2022, 10:27 AM   Clinical Narrative:    This CSW has called [facility] patient will go home with family support.    Final next level of care: Home/Self Care Barriers to Discharge: No Barriers Identified   Patient Goals and CMS Choice      Discharge Placement                  Patient to be transferred to facility by: [place] Name of family member notified: Son Patient and family notified of of transfer: 10/05/22  Discharge Plan and Services Additional resources added to the After Visit Summary for                                       Social Determinants of Health (SDOH) Interventions SDOH Screenings   Tobacco Use: Low Risk  (10/04/2022)     Readmission Risk Interventions     No data to display

## 2022-10-05 NOTE — Plan of Care (Signed)

## 2022-10-05 NOTE — Progress Notes (Signed)
Pt is non verbal at baseline, no family at bedside. Admission Documentation unable to be completed at this time.

## 2022-10-05 NOTE — Progress Notes (Signed)
  Subjective: Patient stable.  Although nonverbal does not appear to be in pain   Objective: Vital signs in last 24 hours: Temp:  [97.4 F (36.3 C)-98.9 F (37.2 C)] 97.4 F (36.3 C) (03/30 0709) Pulse Rate:  [57-76] 68 (03/30 0709) Resp:  [10-19] 18 (03/30 0709) BP: (91-154)/(53-87) 124/74 (03/30 0709) SpO2:  [89 %-100 %] 97 % (03/30 0709) Weight:  [58.1 kg] 58.1 kg (03/29 1600)  Intake/Output from previous day: 03/29 0701 - 03/30 0700 In: 100 [IV Piggyback:100] Out: -  Intake/Output this shift: No intake/output data recorded.  Exam:  No pain with passive motion of the toes on the left-hand side.  The toes are perfused.  Compartments palpates soft above the splint and through the splint.  Labs: Recent Labs    10/04/22 1730 10/05/22 0548  HGB 10.7* 10.0*   Recent Labs    10/04/22 1730 10/05/22 0548  WBC 7.6 7.5  RBC 3.80* 3.62*  HCT 34.1* 33.0*  PLT 353 318   Recent Labs    10/04/22 1730 10/05/22 0548  NA 136 139  K 4.0 5.0  CL 102 106  CO2 27 28  BUN 26* 23  CREATININE 0.96 0.81  GLUCOSE 98 80  CALCIUM 8.9 8.6*   No results for input(s): "LABPT", "INR" in the last 72 hours.  Assessment/Plan: Plan at this time is splint immobilization for the next week.  We will change her over to a short leg cast.  Post reduction radiographs show improved alignment which should be sufficient for her in terms of healing.  Patient does not ambulate.  Eliquis has been resumed for DVT prophylaxis.  Follow-up with [place] on Friday for cast application   Angela Keith 10/05/2022, 9:01 AM

## 2022-10-05 NOTE — Discharge Summary (Signed)
Physician Discharge Summary   Angela Keith [id number] DOB: April 11, 1930 DOA: 10/04/2022  PCP: Josetta Huddle, MD  Admit date: 10/04/2022 Discharge date: 10/05/2022  Admitted From: Home Disposition:  Home Discharging physician: Dwyane Dee, MD Barriers to discharge:   Recommendations for Outpatient Follow-up:  Follow up with orthopedic surgery   Discharge Condition: stable CODE STATUS: DNR Diet recommendation:  Diet Orders (From admission, onward)     Start     Ordered   10/05/22 0000  Diet - low sodium heart healthy        10/05/22 0945   10/04/22 2333  Diet Heart Room service appropriate? Yes; Fluid consistency: Thin  Diet effective now       Question Answer Comment  Room service appropriate? Yes   Fluid consistency: Thin      10/04/22 2334            Hospital Course: Ms. 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 She presented after referral from her orthopedic office due to concern for left distal tib-fib fracture. Imaging was performed which showed a displaced distal left tibial and fibular fracture. She underwent manual partial reduction and was placed in a splint per orthopedic surgery.  She was treated with some sedation during procedure and had some hypotension and hypoxia due to this.  She was monitored overnight and had improvement in blood pressure and was weaned off of oxygen back to room air.  She will follow-up outpatient with orthopedic surgery in 1 week for splint removal and short leg cast application.  Due to her somnolence, she also underwent some further workup to rule out any associated infections.  UA was concerning for possible infection and she was started on Rocephin during hospitalization which was transitioned to cefadroxil at discharge to complete course empirically.    The  patient's chronic medical conditions were treated accordingly per the patient's home medication regimen except as noted.  On day of discharge, patient was felt deemed stable for discharge. Patient/family member advised to call PCP or come back to ER if needed.   Principal Diagnosis: Closed displaced oblique fracture of shaft of left tibia  Discharge Diagnoses: Active Hospital Problems   Diagnosis Date Noted   Closed displaced oblique fracture of shaft of left tibia 10/04/2022   UTI (urinary tract infection) 10/04/2022   QT prolongation 10/04/2022   Normocytic anemia 10/04/2022   Somnolence    Paroxysmal atrial fibrillation ([place]) 06/06/2015   History of stroke    Essential hypertension 11/10/2014   HLD (hyperlipidemia) 11/10/2014   Hypothyroidism 10/30/2012    Resolved Hospital Problems  No resolved problems to display.     Discharge Instructions     Diet - low sodium heart healthy   Complete by: As directed    No wound care   Complete by: As directed       Allergies as of 10/05/2022       Reactions   Ace Inhibitors Other (See Comments)   Severe fatigue   Lipitor [atorvastatin] Other (See Comments)   Mood swings   Motrin [ibuprofen] Nausea And Vomiting        Medication List     TAKE these medications    alendronate 70 MG tablet Commonly known as: FOSAMAX Take 70 mg by mouth once a week.   aspirin EC 81 MG tablet Take 1 tablet (81 mg  total) by mouth daily.   baclofen 10 MG tablet Commonly known as: LIORESAL Take 5 mg by mouth 2 (two) times daily as needed for muscle spasms.   cefadroxil 500 MG capsule Commonly known as: DURICEF Take 2 capsules (1,000 mg total) by mouth 2 (two) times daily for 4 days.   cyanocobalamin 1000 MCG tablet Commonly known as: VITAMIN B12 Take 1,000 mcg by mouth daily.   dorzolamide-timolol 2-0.5 % ophthalmic solution Commonly known as: COSOPT Place 1 drop into both eyes 2 (two) times daily.   Eliquis 5 MG Tabs  tablet Generic drug: apixaban Take 5 mg by mouth 2 (two) times daily.   gabapentin 100 MG capsule Commonly known as: NEURONTIN Take 100 mg by mouth 2 (two) times daily. At breakfast and lunch   HYDROcodone-acetaminophen 5-325 MG tablet Commonly known as: NORCO/VICODIN Take 1 tablet by mouth every 6 (six) hours as needed for up to 7 days for moderate pain or severe pain. What changed: when to take this   latanoprost 0.005 % ophthalmic solution Commonly known as: XALATAN Place 1 drop into both eyes at bedtime.   levothyroxine 88 MCG tablet Commonly known as: SYNTHROID Take 88 mcg by mouth daily before breakfast.   lidocaine 5 % Commonly known as: LIDODERM Place 1 patch onto the skin daily. Left lateral thigh  Remove & Discard patch within 12 hours or as directed by MD   metoprolol tartrate 25 MG tablet Commonly known as: LOPRESSOR Take 25 mg by mouth 2 (two) times daily.   mirtazapine 15 MG tablet Commonly known as: REMERON Take 15 mg by mouth at bedtime.   nitroGLYCERIN 0.4 MG SL tablet Commonly known as: NITROSTAT Take 0.4 mg sublingual tablet under the tongue as needed for chest pressure   NUTRITIONAL SUPPLEMENTS PO Take Medpass 4 oz by mouth 2 times daily to support nutritional needs   pantoprazole 40 MG tablet Commonly known as: PROTONIX Take 40 mg by mouth 2 (two) times daily.   PARoxetine 12.5 MG 24 hr tablet Commonly known as: PAXIL-CR Take 1 tablet by mouth daily.   pravastatin 20 MG tablet Commonly known as: PRAVACHOL Take 1 tablet (20 mg total) by mouth daily at 6 PM.   QUEtiapine 25 MG tablet Commonly known as: SEROQUEL Take 50 mg by mouth at bedtime.   tamsulosin 0.4 MG Caps capsule Commonly known as: FLOMAX TAKE 1 CAPSULE BY MOUTH ONCE DAILY   Tylenol 325 MG tablet Generic drug: acetaminophen Take 650 mg by mouth every 6 (six) hours as needed for mild pain or moderate pain.        Follow-up Information     Marlou Sa, Tonna Corner, MD.  Schedule an appointment as soon as possible for a visit in 1 week(s).   Specialty: Orthopedic Surgery Contact information: [address] [place] [address] [phone]                Allergies  Allergen Reactions   Ace Inhibitors Other (See Comments)    Severe fatigue   Lipitor [Atorvastatin] Other (See Comments)    Mood swings   Motrin [Ibuprofen] Nausea And Vomiting    Consultations: Orthopedic surgery  Procedures:   Discharge Exam: BP (!) 121/93 (BP Location: Left Arm)   Pulse 77   Temp 98.6 F (37 C) (Oral)   Resp 16   Ht 5\' 5"  (1.651 m)   Wt 58.1 kg   SpO2 94%   BMI 21.30 kg/m  Physical Exam Constitutional:  Comments: Chronically ill elderly woman lying in bed in no distress and is nonverbal  HENT:     Head: Normocephalic and atraumatic.     Mouth/Throat:     Mouth: Mucous membranes are moist.  Eyes:     Extraocular Movements: Extraocular movements intact.  Cardiovascular:     Rate and Rhythm: Normal rate and regular rhythm.  Pulmonary:     Effort: Pulmonary effort is normal.     Breath sounds: Normal breath sounds.  Abdominal:     General: Bowel sounds are normal. There is no distension.     Palpations: Abdomen is soft.     Tenderness: There is no abdominal tenderness.  Musculoskeletal:     Cervical back: Normal range of motion and neck supple.     Comments: Left lower extremity noted in splint.  No swelling appreciated at knee or in toes  Neurological:     Comments: Nonverbal and does not follow commands      The results of significant diagnostics from this hospitalization (including imaging, microbiology, ancillary and laboratory) are listed below for reference.   Microbiology: No results found for this or any previous visit (from the past 240 hour(s)).   Labs: BNP (last 3 results) No results for input(s): "BNP" in the last 8760 hours. Basic Metabolic Panel: Recent Labs  Lab 10/04/22 1730 10/05/22 0548  NA 136 139  K  4.0 5.0  CL 102 106  CO2 27 28  GLUCOSE 98 80  BUN 26* 23  CREATININE 0.96 0.81  CALCIUM 8.9 8.6*  MG  --  2.2   Liver Function Tests: Recent Labs  Lab 10/04/22 1730  AST 15  ALT 12  ALKPHOS 60  BILITOT 0.5  PROT 6.8  ALBUMIN 3.4*   No results for input(s): "LIPASE", "AMYLASE" in the last 168 hours. No results for input(s): "AMMONIA" in the last 168 hours. CBC: Recent Labs  Lab 10/04/22 1730 10/05/22 0548  WBC 7.6 7.5  NEUTROABS 4.4  --   HGB 10.7* 10.0*  HCT 34.1* 33.0*  MCV 89.7 91.2  PLT 353 318   Cardiac Enzymes: No results for input(s): "CKTOTAL", "CKMB", "CKMBINDEX", "TROPONINI" in the last 168 hours. BNP: Invalid input(s): "POCBNP" CBG: No results for input(s): "GLUCAP" in the last 168 hours. D-Dimer No results for input(s): "DDIMER" in the last 72 hours. Hgb A1c No results for input(s): "HGBA1C" in the last 72 hours. Lipid Profile No results for input(s): "CHOL", "HDL", "LDLCALC", "TRIG", "CHOLHDL", "LDLDIRECT" in the last 72 hours. Thyroid function studies No results for input(s): "TSH", "T4TOTAL", "T3FREE", "THYROIDAB" in the last 72 hours.  Invalid input(s): "FREET3" Anemia work up No results for input(s): "VITAMINB12", "FOLATE", "FERRITIN", "TIBC", "IRON", "RETICCTPCT" in the last 72 hours. Urinalysis    Component Value Date/Time   COLORURINE YELLOW 10/04/2022 1647   APPEARANCEUR TURBID (A) 10/04/2022 1647   LABSPEC 1.019 10/04/2022 1647   PHURINE 5.0 10/04/2022 1647   GLUCOSEU NEGATIVE 10/04/2022 1647   HGBUR SMALL (A) 10/04/2022 1647   BILIRUBINUR NEGATIVE 10/04/2022 1647   KETONESUR 5 (A) 10/04/2022 1647   PROTEINUR >=300 (A) 10/04/2022 1647   UROBILINOGEN 1.0 01/25/2015 0547   NITRITE POSITIVE (A) 10/04/2022 1647   LEUKOCYTESUR SMALL (A) 10/04/2022 1647   Sepsis Labs Recent Labs  Lab 10/04/22 1730 10/05/22 0548  WBC 7.6 7.5   Microbiology No results found for this or any previous visit (from the past 240  hour(s)).  Procedures/Studies: DG Tibia/Fibula Left  Result Date: 10/04/2022 CLINICAL DATA:  Postreduction EXAM: LEFT TIBIA AND FIBULA - 2 VIEW COMPARISON:  10/04/2022 FINDINGS: In cast views demonstrate decreased displacement and angulation at the distal tibia and fibular shaft fractures. Mild angulation and displacement remain. Proximal fibular shaft fracture also noted with minimal displacement. IMPRESSION: Decreasing displacement across the distal tibia and fibular fractures with continued mild displacement and angulation. Minimally displaced proximal fibular shaft fracture. Electronically Signed   By: Rolm Baptise M.D.   On: 10/04/2022 20:11   DG Chest Portable 1 View  Result Date: 10/04/2022 CLINICAL DATA:  Left tibia and fibula fractures require surgical intervention EXAM: PORTABLE CHEST 1 VIEW COMPARISON:  Chest radiograph dated 08/06/2016 FINDINGS: Implanted loop recorder projects over the left lower chest. Normal lung volumes. No focal consolidations. Retrocardiac lucency. Unchanged blunting of the left costophrenic angle. No pneumothorax. The heart size and mediastinal contours are within normal limits. Aortic atherosclerosis. The visualized skeletal structures are unremarkable. Bilateral breast implants. IMPRESSION: 1. No acute cardiopulmonary findings. 2. Unchanged blunting of the left costophrenic angle, which may represent a small pleural effusion versus pleural thickening. 3. Retrocardiac lucency likely represents hiatal hernia. 4.  Aortic Atherosclerosis (ICD10-I70.0). Electronically Signed   By: Darrin Nipper M.D.   On: 10/04/2022 17:30   DG Tibia/Fibula Left  Result Date: 10/04/2022 CLINICAL DATA:  [place], pain EXAM: LEFT TIBIA AND FIBULA - 2 VIEW COMPARISON:  None Available. FINDINGS: Frontal and lateral views of the left tibia and fibula are obtained. There are displaced oblique fractures of the distal left tibia and fibular diaphyses, with slight valgus and severe dorsal angulation at  the fracture site. Bones are osteopenic. Alignment of the left knee and ankle appears anatomic. There is diffuse soft tissue edema. IMPRESSION: 1. Displaced distal left tibial and fibular diaphyseal fractures, with valgus and dorsal angulation at the fracture site. 2. Diffuse soft tissue edema. 3. Osteopenia. Electronically Signed   By: Randa Ngo M.D.   On: 10/04/2022 17:25     Time coordinating discharge: Over 30 minutes    Dwyane Dee, MD  [facility] 10/05/2022, 12:30 PM

## 2022-10-05 NOTE — Hospital Course (Signed)
Ms. 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 She presented after referral from her orthopedic office due to concern for left distal tib-fib fracture. Imaging was performed which showed a displaced distal left tibial and fibular fracture. She underwent manual partial reduction and was placed in a splint per orthopedic surgery.  She was treated with some sedation during procedure and had some hypotension and hypoxia due to this.  She was monitored overnight and had improvement in blood pressure and was weaned off of oxygen back to room air.  She will follow-up outpatient with orthopedic surgery in 1 week for splint removal and short leg cast application.  Due to her somnolence, she also underwent some further workup to rule out any associated infections.  UA was concerning for possible infection and she was started on Rocephin during hospitalization which was transitioned to cefadroxil at discharge to complete course empirically.

## 2022-10-05 NOTE — Consult Note (Signed)
[facility] Nurse Consult Note: Reason for Consult:Left hip wound with no need for intervention. Patient to follow up next week with Orthopedics. (See note from Dr. Marlou Sa from yesterday) Silicone foam applied. Patient to be discharged today.  [facility] nursing team will not follow, but will remain available to this patient, the nursing and medical teams.  Please re-consult if needed.  Thank you for inviting [place] to participate in this patient's Plan of Care.  Maudie Flakes, MSN, RN, CNS, [place], Serita Grammes, [employer], [employer] phone:  [phone]

## 2022-10-07 LAB — URINE CULTURE: Culture: >=100000 — AB

## 2022-10-16 ENCOUNTER — Other Ambulatory Visit (INDEPENDENT_AMBULATORY_CARE_PROVIDER_SITE_OTHER): Payer: Medicare Other

## 2022-10-16 ENCOUNTER — Ambulatory Visit (INDEPENDENT_AMBULATORY_CARE_PROVIDER_SITE_OTHER): Payer: Medicare Other | Admitting: Orthopedic Surgery

## 2022-10-16 DIAGNOSIS — S82232A Displaced oblique fracture of shaft of left tibia, initial encounter for closed fracture: Secondary | ICD-10-CM | POA: Diagnosis not present

## 2022-10-20 ENCOUNTER — Encounter: Payer: Self-pay | Admitting: Orthopedic Surgery

## 2022-10-20 NOTE — Progress Notes (Signed)
Post-Op Visit Note   Patient: Angela Keith           Date of Birth: 29-Sep-1929           MRN: [id number] Visit Date: 10/16/2022 PCP: Thana Ates, MD   Assessment & Plan:  Chief Complaint:  Chief Complaint  Patient presents with   Left Leg - Fracture   Visit Diagnoses:  1. Closed displaced oblique fracture of shaft of left tibia, initial encounter     Plan: Angela Keith is a patient who is here for follow-up from the emergency department.  She is about 1 week out from distal tib-fib fracture.  She does not ambulate.  Not an operative candidate.  On examination the splint is removed.  Compartments are soft.  Fracture remains mobile.  Has a little bit of skin pressure around the fracture site.  This is not an open wound but more like a pressure type situation.  The leg is remanipulated today and we kept the leg in slight hyperextension at the fracture site to diminish some of the pressure over that skin.  Well-padded short leg cast is also applied.  Patient can only straighten her leg out to have a long-leg cast.  She will follow-up in 2 weeks to evaluate the skin with removal of the cast at that time.  Follow-Up Instructions: No follow-ups on file.   Orders:  Orders Placed This Encounter  Procedures   XR Tibia/Fibula Left   No orders of the defined types were placed in this encounter.   Imaging: No results found.  PMFS History: Patient Active Problem List   Diagnosis Date Noted   Closed displaced oblique fracture of shaft of left tibia 10/04/2022   UTI (urinary tract infection) 10/04/2022   QT prolongation 10/04/2022   Normocytic anemia 10/04/2022   Somnolence    Acute ischemic left MCA stroke 08/06/2016   Acute urinary retention 08/06/2016   Aphasia    Depression 02/03/2016   Insomnia 02/03/2016   Chronic musculoskeletal pain 02/03/2016   GERD (gastroesophageal reflux disease) 12/31/2015   Pressure ulcer 12/23/2015   Paroxysmal atrial fibrillation 06/06/2015    Embolic stroke 03/22/2015   Left hemiparesis (HCC)    CVA (cerebral vascular accident)    History of stroke    Spinal stenosis, lumbar region, with neurogenic claudication 01/17/2015   Lumbar stenosis 01/17/2015   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   Essential hypertension 11/10/2014   HLD (hyperlipidemia) 11/10/2014   Chronic back pain 11/08/2014   Cerebral infarction due to thrombosis of cerebral artery    Hyperlipidemia LDL goal <70 09/07/2014   Presumed CVA (cerebral infarction) s/p IV tPA, MRI negative 09/05/2014   Hypertensive urgency 09/05/2014   Right bundle branch block 03/21/2014   Cough 10/31/2012   Elevated lipase 10/30/2012   HTN (hypertension) 10/30/2012   Hypothyroidism 10/30/2012   Past Medical History:  Diagnosis Date   Arthritis    Atrial fibrillation (HCC)    Chronic neck pain    DJD (degenerative joint disease), cervical    DJD (degenerative joint disease), lumbar    Dysphagia    GERD (gastroesophageal reflux disease)    Glaucoma    HTN (hypertension) 10/30/2012   Hyperlipidemia    Hypothyroidism    Insomnia    Nocturia    Right bundle branch block 03/21/2014   Rotoscoliosis    Severe hearing loss    Shortness of breath dyspnea    Stroke (HCC)    Unspecified hypothyroidism 10/30/2012  Family History  Problem Relation Age of Onset   CAD Father    Stroke Father    Diabetes Mother    Lung cancer Other    CAD Brother     Past Surgical History:  Procedure Laterality Date   BACK SURGERY     lower back    bladder laser surgery     BREAST SURGERY     cataract surgery     EP IMPLANTABLE DEVICE N/A 01/19/2015   Procedure: Loop Recorder Insertion;  Surgeon: Marinus Maw, MD;  Location: [facility] INVASIVE CV LAB;  Service: Cardiovascular;  Laterality: N/A;   ESOPHAGOGASTRODUODENOSCOPY (EGD) WITH ESOPHAGEAL DILATION  06/08/2012   Procedure: ESOPHAGOGASTRODUODENOSCOPY (EGD) WITH ESOPHAGEAL DILATION;  Surgeon: Charolett Bumpers, MD;  Location: [facility]  ENDOSCOPY;  Service: Endoscopy;  Laterality: N/A;   facail surgery  2000   facelift and eyelid lift   left carpal tunnel surgery     LUMBAR LAMINECTOMY/DECOMPRESSION MICRODISCECTOMY N/A 01/17/2015   Procedure: Laminectomy and Foraminotomy - L2-L3 - L3-L4;  Surgeon: [name], MD;  Location: [facility] NEURO ORS;  Service: Neurosurgery;  Laterality: N/A;  Laminectomy and Foraminotomy - L2-L3 - L3-L4   TEE WITHOUT CARDIOVERSION N/A 01/19/2015   Procedure: TRANSESOPHAGEAL ECHOCARDIOGRAM (TEE);  Surgeon: Chrystie Nose, MD;  Location: [facility] ENDOSCOPY;  Service: Cardiovascular;  Laterality: N/A;   Social History   Occupational History   Occupation: retired    Comment: Veterinary surgeon, office work  Tobacco Use   Smoking status: Never   Smokeless tobacco: Never  Building services engineer Use: Never used  Substance and Sexual Activity   Alcohol use: No   Drug use: No   Sexual activity: Never

## 2022-11-01 ENCOUNTER — Encounter: Payer: Self-pay | Admitting: Orthopedic Surgery

## 2022-11-01 ENCOUNTER — Other Ambulatory Visit (INDEPENDENT_AMBULATORY_CARE_PROVIDER_SITE_OTHER): Payer: Medicare Other

## 2022-11-01 ENCOUNTER — Ambulatory Visit (INDEPENDENT_AMBULATORY_CARE_PROVIDER_SITE_OTHER): Payer: Medicare Other | Admitting: Orthopedic Surgery

## 2022-11-01 DIAGNOSIS — S82232A Displaced oblique fracture of shaft of left tibia, initial encounter for closed fracture: Secondary | ICD-10-CM

## 2022-11-01 NOTE — Progress Notes (Signed)
Post-Op Visit Note   Patient: Angela Keith           Date of Birth: [birth year]           MRN: [id number] Visit Date: 11/01/2022 PCP: Thana Ates, MD   Assessment & Plan:  Chief Complaint:  Chief Complaint  Patient presents with   Left Leg - Follow-up   Visit Diagnoses:  1. Closed displaced oblique fracture of shaft of left tibia, initial encounter     Plan: Patient is a 89 year old female who presents for reevaluation of closed distal tibial shaft fracture.  She has been in a cast for the last 2 weeks.  Cast removed today and there is significant lack of callus formation on radiographs with gross mobility at the fracture site.  Small healing ulcer noted anterior to the fracture site.  There is a new ulcer that is noted in the posterior aspect of the knee where patient has been rubbing the cast consistently by flexing the knee and flexing her hip.  There is no purulent drainage from this ulcer.  She has history of stroke and really does not have any motor function below the knee of this extremity.  We discussed options available to the patient with her daughter-in-law today.  Main options include placing back in a cast versus intramedullary nail versus amputation.  Amputation seems to be a drastic step at this time.  After discussion, daughter-in-law would like to proceed with intramedullary nail.  She will be placed in a cast today and will be shorter in order to avoid any further exacerbation of the ulceration.  She was placed in a small amount of apex posterior angulation in order to prevent the fracture fragments from causing anterior ulceration.  Collagen dressing placed over the ulceration behind the knee.  She will be posted for intramedullary nail.  We will obtain medical risk stratification prior to procedure and she will need to hold her Eliquis at least 2 days in advance.  Follow-Up Instructions: No follow-ups on file.   Orders:  Orders Placed This Encounter  Procedures    XR Tibia/Fibula Left   No orders of the defined types were placed in this encounter.   Imaging: XR Tibia/Fibula Left  Result Date: 11/01/2022 AP lateral radiographs left tib-fib reviewed.  Minimal to no callus formation is present in the tibia fracture.  Overall alignment slight hyperextension at the fracture site.   PMFS History: Patient Active Problem List   Diagnosis Date Noted   Closed displaced oblique fracture of shaft of left tibia 10/04/2022   UTI (urinary tract infection) 10/04/2022   QT prolongation 10/04/2022   Normocytic anemia 10/04/2022   Somnolence    Acute ischemic left MCA stroke (HCC) 08/06/2016   Acute urinary retention 08/06/2016   Aphasia    Depression 02/03/2016   Insomnia 02/03/2016   Chronic musculoskeletal pain 02/03/2016   GERD (gastroesophageal reflux disease) 12/31/2015   Pressure ulcer 12/23/2015   Paroxysmal atrial fibrillation (HCC) 06/06/2015   Embolic stroke (HCC) 03/22/2015   Left hemiparesis (HCC)    CVA (cerebral vascular accident) (HCC)    History of stroke    Spinal stenosis, lumbar region, with neurogenic claudication 01/17/2015   Lumbar stenosis 01/17/2015   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   Essential hypertension 11/10/2014   HLD (hyperlipidemia) 11/10/2014   Chronic back pain 11/08/2014   Cerebral infarction due to thrombosis of cerebral artery (HCC)    Hyperlipidemia LDL goal <70 09/07/2014   Presumed CVA (  cerebral infarction) s/p IV tPA, MRI negative 09/05/2014   Hypertensive urgency 09/05/2014   Right bundle branch block 03/21/2014   Cough 10/31/2012   Elevated lipase 10/30/2012   HTN (hypertension) 10/30/2012   Hypothyroidism 10/30/2012   Past Medical History:  Diagnosis Date   Arthritis    Atrial fibrillation (HCC)    Chronic neck pain    DJD (degenerative joint disease), cervical    DJD (degenerative joint disease), lumbar    Dysphagia    GERD (gastroesophageal reflux disease)    Glaucoma    HTN  (hypertension) 10/30/2012   Hyperlipidemia    Hypothyroidism    Insomnia    Nocturia    Right bundle branch block 03/21/2014   Rotoscoliosis    Severe hearing loss    Shortness of breath dyspnea    Stroke [facility])    Unspecified hypothyroidism 10/30/2012    Family History  Problem Relation Age of Onset   CAD Father    Stroke Father    Diabetes Mother    Lung cancer Other    CAD Brother     Past Surgical History:  Procedure Laterality Date   BACK SURGERY     lower back    bladder laser surgery     BREAST SURGERY     cataract surgery     EP IMPLANTABLE DEVICE N/A 01/19/2015   Procedure: Loop Recorder Insertion;  Surgeon: Marinus Maw, MD;  Location: [facility];  Service: Cardiovascular;  Laterality: N/A;   ESOPHAGOGASTRODUODENOSCOPY (EGD) WITH ESOPHAGEAL DILATION  06/08/2012   Procedure: ESOPHAGOGASTRODUODENOSCOPY (EGD) WITH ESOPHAGEAL DILATION;  Surgeon: Charolett Bumpers, MD;  Location: [facility];  Service: Endoscopy;  Laterality: N/A;   facail surgery  2000   facelift and eyelid lift   left carpal tunnel surgery     LUMBAR LAMINECTOMY/DECOMPRESSION MICRODISCECTOMY N/A 01/17/2015   Procedure: Laminectomy and Foraminotomy - L2-L3 - L3-L4;  Surgeon: [name], MD;  Location: [facility];  Service: Neurosurgery;  Laterality: N/A;  Laminectomy and Foraminotomy - L2-L3 - L3-L4   TEE WITHOUT CARDIOVERSION N/A 01/19/2015   Procedure: TRANSESOPHAGEAL ECHOCARDIOGRAM (TEE);  Surgeon: Chrystie Nose, MD;  Location: [facility] ENDOSCOPY;  Service: Cardiovascular;  Laterality: N/A;   Social History   Occupational History   Occupation: retired    Comment: Veterinary surgeon, office work  Tobacco Use   Smoking status: Never   Smokeless tobacco: Never  Building services engineer Use: Never used  Substance and Sexual Activity   Alcohol use: No   Drug use: No   Sexual activity: Never

## 2022-11-03 ENCOUNTER — Encounter: Payer: Self-pay | Admitting: Orthopedic Surgery

## 2022-11-04 ENCOUNTER — Telehealth: Payer: Self-pay | Admitting: Orthopedic Surgery

## 2022-11-04 NOTE — Telephone Encounter (Signed)
Patient's daughter Chaya Jan would like the surgery for the left tibial shaft fracture to be cancelled for 11-12-22 because they are not comfortable with the patient coming off of the blood thinner, considering her history of strokes.  She would prefer to continue with cast changing.  Call [phone] if you need to speak directly with Dena.

## 2022-11-04 NOTE — Telephone Encounter (Signed)
Patients states family requesting not to have surgery. Patient family states she will continue to change cast.

## 2022-11-05 NOTE — Telephone Encounter (Signed)
See other note. Sent to Dr August Saucer to advise.

## 2022-11-11 ENCOUNTER — Encounter (HOSPITAL_COMMUNITY): Payer: Self-pay | Admitting: Physician Assistant

## 2022-11-11 NOTE — Progress Notes (Signed)
I spoke with Chaya Jan, patient's daughter in law. Pt is cancelling her surgery for tomorrow. Dena stated that Dr. Diamantina Providence office has been made aware of this.

## 2022-11-11 NOTE — Telephone Encounter (Signed)
Ok for this thx

## 2022-11-12 ENCOUNTER — Ambulatory Visit (HOSPITAL_COMMUNITY): Admission: RE | Admit: 2022-11-12 | Payer: Medicare Other | Source: Home / Self Care | Admitting: Orthopedic Surgery

## 2022-11-12 ENCOUNTER — Encounter (HOSPITAL_COMMUNITY): Admission: RE | Payer: Self-pay | Source: Home / Self Care

## 2022-11-12 SURGERY — INSERTION, INTRAMEDULLARY ROD, TIBIA
Anesthesia: Choice | Site: Leg Lower | Laterality: Left

## 2022-11-15 ENCOUNTER — Other Ambulatory Visit (INDEPENDENT_AMBULATORY_CARE_PROVIDER_SITE_OTHER): Payer: Medicare Other

## 2022-11-15 ENCOUNTER — Ambulatory Visit (INDEPENDENT_AMBULATORY_CARE_PROVIDER_SITE_OTHER): Payer: Medicare Other | Admitting: Surgical

## 2022-11-15 DIAGNOSIS — M810 Age-related osteoporosis without current pathological fracture: Secondary | ICD-10-CM | POA: Diagnosis not present

## 2022-11-15 DIAGNOSIS — S82232A Displaced oblique fracture of shaft of left tibia, initial encounter for closed fracture: Secondary | ICD-10-CM

## 2022-11-16 ENCOUNTER — Encounter: Payer: Self-pay | Admitting: Surgical

## 2022-11-16 LAB — VITAMIN D 25 HYDROXY (VIT D DEFICIENCY, FRACTURES): Vit D, 25-Hydroxy: 83 ng/mL (ref 30–100)

## 2022-11-16 NOTE — Progress Notes (Signed)
Post-fracture Visit Note   Patient: Angela Keith           Date of Birth: [birth year]           MRN: [id number] Visit Date: 11/15/2022 PCP: Thana Ates, MD   Assessment & Plan:  Chief Complaint:  Chief Complaint  Patient presents with   Left Leg - Fracture, Follow-up   Visit Diagnoses:  1. Closed displaced oblique fracture of shaft of left tibia, initial encounter   2. Age-related osteoporosis without current pathological fracture     Plan: Patient is a 89 year old female who returns for reevaluation of closed fracture of distal left tibia/fibula shaft.  She was previously in short leg cast that was made shorter than normal to prevent exacerbation of the ulcer she had developed behind her knee.  On evaluation today, that ulceration has healed significantly with no evidence of infection.  Still has a little bit of depth to heal back to skin level but it looks significantly improved compared with last exam.  She has decreased mobility at the fracture site compared with the last evaluation with significantly less tendency for the fracture site to "flop" into apex anterior angulation.  We will continue with cast changes as she cannot undergo surgery due to every time she comes off of her blood thinner she has a stroke.  Will also check a vitamin D which was found to be 83.  Short leg cast reapplied and the fracture site was kept straight and with a little bit of traction until the cast hardened.  Today's radiographs demonstrate no callus formation but clinically, seems that she is slowly healing this fracture.  We will see her back in 2-2 and half weeks and discontinue the cast at that point with new radiographs.  The cast was made sure to be very well-padded.  Daughter-in-law agreed with this plan.  Follow-Up Instructions: No follow-ups on file.   Orders:  Orders Placed This Encounter  Procedures   XR Tibia/Fibula Left   Vitamin D (25 hydroxy)   No orders of the defined types were  placed in this encounter.   Imaging: No results found.  PMFS History: Patient Active Problem List   Diagnosis Date Noted   Closed displaced oblique fracture of shaft of left tibia 10/04/2022   UTI (urinary tract infection) 10/04/2022   QT prolongation 10/04/2022   Normocytic anemia 10/04/2022   Somnolence    Acute ischemic left MCA stroke (HCC) 08/06/2016   Acute urinary retention 08/06/2016   Aphasia    Depression 02/03/2016   Insomnia 02/03/2016   Chronic musculoskeletal pain 02/03/2016   GERD (gastroesophageal reflux disease) 12/31/2015   Pressure ulcer 12/23/2015   Paroxysmal atrial fibrillation (HCC) 06/06/2015   Embolic stroke (HCC) 03/22/2015   Left hemiparesis (HCC)    CVA (cerebral vascular accident) (HCC)    History of stroke    Spinal stenosis, lumbar region, with neurogenic claudication 01/17/2015   Lumbar stenosis 01/17/2015   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   Essential hypertension 11/10/2014   HLD (hyperlipidemia) 11/10/2014   Chronic back pain 11/08/2014   Cerebral infarction due to thrombosis of cerebral artery (HCC)    Hyperlipidemia LDL goal <70 09/07/2014   Presumed CVA (cerebral infarction) s/p IV tPA, MRI negative 09/05/2014   Hypertensive urgency 09/05/2014   Right bundle branch block 03/21/2014   Cough 10/31/2012   Elevated lipase 10/30/2012   HTN (hypertension) 10/30/2012   Hypothyroidism 10/30/2012   Past Medical History:  Diagnosis Date  Arthritis    Atrial fibrillation (HCC)    Chronic neck pain    DJD (degenerative joint disease), cervical    DJD (degenerative joint disease), lumbar    Dysphagia    GERD (gastroesophageal reflux disease)    Glaucoma    HTN (hypertension) 10/30/2012   Hyperlipidemia    Hypothyroidism    Insomnia    Nocturia    Right bundle branch block 03/21/2014   Rotoscoliosis    Severe hearing loss    Shortness of breath dyspnea    Stroke [facility])    Unspecified hypothyroidism 10/30/2012     Family History  Problem Relation Age of Onset   CAD Father    Stroke Father    Diabetes Mother    Lung cancer Other    CAD Brother     Past Surgical History:  Procedure Laterality Date   BACK SURGERY     lower back    bladder laser surgery     BREAST SURGERY     cataract surgery     EP IMPLANTABLE DEVICE N/A 01/19/2015   Procedure: Loop Recorder Insertion;  Surgeon: Marinus Maw, MD;  [facility]: [facility];  Service: Cardiovascular;  Laterality: N/A;   ESOPHAGOGASTRODUODENOSCOPY (EGD) WITH ESOPHAGEAL DILATION  06/08/2012   Procedure: ESOPHAGOGASTRODUODENOSCOPY (EGD) WITH ESOPHAGEAL DILATION;  Surgeon: Charolett Bumpers, MD;  Location: [facility];  Service: Endoscopy;  Laterality: N/A;   facail surgery  2000   facelift and eyelid lift   left carpal tunnel surgery     LUMBAR LAMINECTOMY/DECOMPRESSION MICRODISCECTOMY N/A 01/17/2015   Procedure: Laminectomy and Foraminotomy - L2-L3 - L3-L4;  Surgeon: [name], MD;  Location: [facility];  Service: Neurosurgery;  Laterality: N/A;  Laminectomy and Foraminotomy - L2-L3 - L3-L4   TEE WITHOUT CARDIOVERSION N/A 01/19/2015   Procedure: TRANSESOPHAGEAL ECHOCARDIOGRAM (TEE);  Surgeon: Chrystie Nose, MD;  Location: [facility] ENDOSCOPY;  Service: Cardiovascular;  Laterality: N/A;   Social History   Occupational History   Occupation: retired    Comment: Veterinary surgeon, office work  Tobacco Use   Smoking status: Never   Smokeless tobacco: Never  Building services engineer Use: Never used  Substance and Sexual Activity   Alcohol use: No   Drug use: No   Sexual activity: Never

## 2022-11-18 NOTE — Progress Notes (Signed)
Called and left VM

## 2022-11-25 ENCOUNTER — Encounter: Payer: Medicare Other | Admitting: Surgical

## 2022-12-04 ENCOUNTER — Encounter: Payer: Self-pay | Admitting: Surgical

## 2022-12-04 ENCOUNTER — Ambulatory Visit (INDEPENDENT_AMBULATORY_CARE_PROVIDER_SITE_OTHER): Payer: Medicare Other | Admitting: Surgical

## 2022-12-04 ENCOUNTER — Other Ambulatory Visit (INDEPENDENT_AMBULATORY_CARE_PROVIDER_SITE_OTHER): Payer: Medicare Other

## 2022-12-04 DIAGNOSIS — S82232A Displaced oblique fracture of shaft of left tibia, initial encounter for closed fracture: Secondary | ICD-10-CM

## 2022-12-04 NOTE — Progress Notes (Signed)
Post-Op Visit Note   Patient: Angela Keith           Date of Birth: [birth year]           MRN: [id number] Visit Date: 12/04/2022 PCP: Thana Ates, MD   Assessment & Plan:  Chief Complaint:  Chief Complaint  Patient presents with   Left Leg - Fracture, Follow-up   Visit Diagnoses:  1. Closed displaced oblique fracture of shaft of left tibia, initial encounter     Plan: Patient is a 89 year old female who presents for reevaluation of closed displaced fracture of the left tibial shaft with fracture of the fibular shaft.  She has had treatment with several casts.  Presents today for cast removal and reevaluation.  Radiographs demonstrate no significant callus formation of the tibial fracture but there is a little bit of callus noted around the fibular fracture.  There is maybe a little bit of decreased mobility at the fracture site compared with previous exam.  No new ulcer but there is a little bit of a early pressure injury to the dorsum of the foot along the bony prominence which is redness but no ulceration.  Ulcer behind her knee continues to heal well.  After discussion with her family, they are all in favor of proceeding with cast immobilization.  Surgery not really an option for her as she would have to come off of her blood thinner and every time this has happened, she has had a stroke.  Short leg cast reapplied today which was well-padded especially around the new area of redness on her foot and all the bony prominences.  See her back in 2-1/2 to 3 weeks for clinical recheck with Dr. August Saucer.  Follow-Up Instructions: No follow-ups on file.   Orders:  Orders Placed This Encounter  Procedures   XR Tibia/Fibula Left   No orders of the defined types were placed in this encounter.   Imaging: No results found.  PMFS History: Patient Active Problem List   Diagnosis Date Noted   Closed displaced oblique fracture of shaft of left tibia 10/04/2022   UTI (urinary tract infection)  10/04/2022   QT prolongation 10/04/2022   Normocytic anemia 10/04/2022   Somnolence    Acute ischemic left MCA stroke (HCC) 08/06/2016   Acute urinary retention 08/06/2016   Aphasia    Depression 02/03/2016   Insomnia 02/03/2016   Chronic musculoskeletal pain 02/03/2016   GERD (gastroesophageal reflux disease) 12/31/2015   Pressure ulcer 12/23/2015   Paroxysmal atrial fibrillation (HCC) 06/06/2015   Embolic stroke (HCC) 03/22/2015   Left hemiparesis (HCC)    CVA (cerebral vascular accident) (HCC)    History of stroke    Spinal stenosis, lumbar region, with neurogenic claudication 01/17/2015   Lumbar stenosis 01/17/2015   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   Essential hypertension 11/10/2014   HLD (hyperlipidemia) 11/10/2014   Chronic back pain 11/08/2014   Cerebral infarction due to thrombosis of cerebral artery (HCC)    Hyperlipidemia LDL goal <70 09/07/2014   Presumed CVA (cerebral infarction) s/p IV tPA, MRI negative 09/05/2014   Hypertensive urgency 09/05/2014   Right bundle branch block 03/21/2014   Cough 10/31/2012   Elevated lipase 10/30/2012   HTN (hypertension) 10/30/2012   Hypothyroidism 10/30/2012   Past Medical History:  Diagnosis Date   Arthritis    Atrial fibrillation (HCC)    Chronic neck pain    DJD (degenerative joint disease), cervical    DJD (degenerative joint disease), lumbar  Dysphagia    GERD (gastroesophageal reflux disease)    Glaucoma    HTN (hypertension) 10/30/2012   Hyperlipidemia    Hypothyroidism    Insomnia    Nocturia    Right bundle branch block 03/21/2014   Rotoscoliosis    Severe hearing loss    Shortness of breath dyspnea    Stroke [facility])    Unspecified hypothyroidism 10/30/2012    Family History  Problem Relation Age of Onset   CAD Father    Stroke Father    Diabetes Mother    Lung cancer Other    CAD Brother     Past Surgical History:  Procedure Laterality Date   BACK SURGERY     lower back    bladder  laser surgery     BREAST SURGERY     cataract surgery     EP IMPLANTABLE DEVICE N/A 01/19/2015   Procedure: Loop Recorder Insertion;  Surgeon: Marinus Maw, MD;  Location: [facility];  Service: Cardiovascular;  Laterality: N/A;   ESOPHAGOGASTRODUODENOSCOPY (EGD) WITH ESOPHAGEAL DILATION  06/08/2012   Procedure: ESOPHAGOGASTRODUODENOSCOPY (EGD) WITH ESOPHAGEAL DILATION;  Surgeon: Charolett Bumpers, MD;  Location: [facility] ENDOSCOPY;  Service: Endoscopy;  Laterality: N/A;   facail surgery  2000   facelift and eyelid lift   left carpal tunnel surgery     LUMBAR LAMINECTOMY/DECOMPRESSION MICRODISCECTOMY N/A 01/17/2015   Procedure: Laminectomy and Foraminotomy - L2-L3 - L3-L4;  Surgeon: [name], MD;  Location: [facility];  Service: Neurosurgery;  Laterality: N/A;  Laminectomy and Foraminotomy - L2-L3 - L3-L4   TEE WITHOUT CARDIOVERSION N/A 01/19/2015   Procedure: TRANSESOPHAGEAL ECHOCARDIOGRAM (TEE);  Surgeon: Chrystie Nose, MD;  Location: [facility] ENDOSCOPY;  Service: Cardiovascular;  Laterality: N/A;   Social History   Occupational History   Occupation: retired    Comment: Veterinary surgeon, office work  Tobacco Use   Smoking status: Never   Smokeless tobacco: Never  Building services engineer Use: Never used  Substance and Sexual Activity   Alcohol use: No   Drug use: No   Sexual activity: Never

## 2022-12-25 ENCOUNTER — Ambulatory Visit (INDEPENDENT_AMBULATORY_CARE_PROVIDER_SITE_OTHER): Payer: Medicare Other | Admitting: Surgical

## 2022-12-25 ENCOUNTER — Other Ambulatory Visit (INDEPENDENT_AMBULATORY_CARE_PROVIDER_SITE_OTHER): Payer: Medicare Other

## 2022-12-25 DIAGNOSIS — S82232A Displaced oblique fracture of shaft of left tibia, initial encounter for closed fracture: Secondary | ICD-10-CM

## 2022-12-26 ENCOUNTER — Encounter: Payer: Self-pay | Admitting: Surgical

## 2022-12-26 NOTE — Progress Notes (Signed)
Post-Op Visit Note   Patient: Angela Keith           Date of Birth: [birth year]           MRN: [id number] Visit Date: 12/25/2022 PCP: Thana Ates, MD   Assessment & Plan:  Chief Complaint:  Chief Complaint  Patient presents with   Left Leg - Pain   Visit Diagnoses:  1. Closed displaced oblique fracture of shaft of left tibia, initial encounter     Plan: Patient is a 89 year old female who returns for reevaluation of closed fracture of left tib-fib region.  She has been treated with serial casting over the last almost 3 months with increased callus formation noted around the fibular fracture but no callus formation noted around the tibial fracture today on radiographs.  She does have slightly decreased fracture mobility but there is still a significant amount especially with apex posterior angulation.  The fracture has some stability with avoiding apex anterior angulation.  We have discussed surgical intervention in the past but she cannot really come off her blood thinners without risking a stroke.  With the reduced fracture ability slightly, but no callus formation, plan to treat with fracture boot that she will leave on at all times aside from bathing and bone stimulator use.  Will get her set up for a bone stim and see her back in about 6 weeks for clinical recheck with Dr. August Saucer.  The ulceration behind her knee has completely healed.  The slight early pressure wound that was noted on the dorsum of her foot has also recovered as well.  If extended treatment with bone stim does not yield any callus formation, could consider trying to perform amputation while patient is on blood thinners with Dr. Lajoyce Corners but we will try this more conservative measures first.  Follow-Up Instructions: Return in about 6 weeks (around 02/05/2023).   Orders:  Orders Placed This Encounter  Procedures   XR Tibia/Fibula Left   No orders of the defined types were placed in this encounter.   Imaging: XR  Tibia/Fibula Left  Result Date: 12/26/2022 AP and lateral views of left tib-fib reviewed.  Increased callus formation noted of the fibular fracture but there is no callus formation around the tibial shaft fracture with continued angulation and displacement   PMFS History: Patient Active Problem List   Diagnosis Date Noted   Closed displaced oblique fracture of shaft of left tibia 10/04/2022   UTI (urinary tract infection) 10/04/2022   QT prolongation 10/04/2022   Normocytic anemia 10/04/2022   Somnolence    Acute ischemic left MCA stroke (HCC) 08/06/2016   Acute urinary retention 08/06/2016   Aphasia    Depression 02/03/2016   Insomnia 02/03/2016   Chronic musculoskeletal pain 02/03/2016   GERD (gastroesophageal reflux disease) 12/31/2015   Pressure ulcer 12/23/2015   Paroxysmal atrial fibrillation (HCC) 06/06/2015   Embolic stroke (HCC) 03/22/2015   Left hemiparesis (HCC)    CVA (cerebral vascular accident) (HCC)    History of stroke    Spinal stenosis, lumbar region, with neurogenic claudication 01/17/2015   Lumbar stenosis 01/17/2015   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   Essential hypertension 11/10/2014   HLD (hyperlipidemia) 11/10/2014   Chronic back pain 11/08/2014   Cerebral infarction due to thrombosis of cerebral artery (HCC)    Hyperlipidemia LDL goal <70 09/07/2014   Presumed CVA (cerebral infarction) s/p IV tPA, MRI negative 09/05/2014   Hypertensive urgency 09/05/2014   Right bundle branch block 03/21/2014  Cough 10/31/2012   Elevated lipase 10/30/2012   HTN (hypertension) 10/30/2012   Hypothyroidism 10/30/2012   Past Medical History:  Diagnosis Date   Arthritis    Atrial fibrillation (HCC)    Chronic neck pain    DJD (degenerative joint disease), cervical    DJD (degenerative joint disease), lumbar    Dysphagia    GERD (gastroesophageal reflux disease)    Glaucoma    HTN (hypertension) 10/30/2012   Hyperlipidemia    Hypothyroidism     Insomnia    Nocturia    Right bundle branch block 03/21/2014   Rotoscoliosis    Severe hearing loss    Shortness of breath dyspnea    Stroke [facility])    Unspecified hypothyroidism 10/30/2012    Family History  Problem Relation Age of Onset   CAD Father    Stroke Father    Diabetes Mother    Lung cancer Other    CAD Brother     Past Surgical History:  Procedure Laterality Date   BACK SURGERY     lower back    bladder laser surgery     BREAST SURGERY     cataract surgery     EP IMPLANTABLE DEVICE N/A 01/19/2015   Procedure: Loop Recorder Insertion;  Surgeon: Marinus Maw, MD;  [facility]: [facility];  Service: Cardiovascular;  Laterality: N/A;   ESOPHAGOGASTRODUODENOSCOPY (EGD) WITH ESOPHAGEAL DILATION  06/08/2012   Procedure: ESOPHAGOGASTRODUODENOSCOPY (EGD) WITH ESOPHAGEAL DILATION;  Surgeon: Charolett Bumpers, MD;  Location: [facility] ENDOSCOPY;  Service: Endoscopy;  Laterality: N/A;   facail surgery  2000   facelift and eyelid lift   left carpal tunnel surgery     LUMBAR LAMINECTOMY/DECOMPRESSION MICRODISCECTOMY N/A 01/17/2015   Procedure: Laminectomy and Foraminotomy - L2-L3 - L3-L4;  Surgeon: [name], MD;  Location: [facility];  Service: Neurosurgery;  Laterality: N/A;  Laminectomy and Foraminotomy - L2-L3 - L3-L4   TEE WITHOUT CARDIOVERSION N/A 01/19/2015   Procedure: TRANSESOPHAGEAL ECHOCARDIOGRAM (TEE);  Surgeon: Chrystie Nose, MD;  Location: [facility] ENDOSCOPY;  Service: Cardiovascular;  Laterality: N/A;   Social History   Occupational History   Occupation: retired    Comment: Veterinary surgeon, office work  Tobacco Use   Smoking status: Never   Smokeless tobacco: Never  Building services engineer Use: Never used  Substance and Sexual Activity   Alcohol use: No   Drug use: No   Sexual activity: Never

## 2023-02-06 ENCOUNTER — Other Ambulatory Visit (INDEPENDENT_AMBULATORY_CARE_PROVIDER_SITE_OTHER): Payer: Medicare Other

## 2023-02-06 ENCOUNTER — Ambulatory Visit (INDEPENDENT_AMBULATORY_CARE_PROVIDER_SITE_OTHER): Payer: Medicare Other | Admitting: Surgical

## 2023-02-06 ENCOUNTER — Encounter: Payer: Self-pay | Admitting: Orthopedic Surgery

## 2023-02-06 DIAGNOSIS — S82232A Displaced oblique fracture of shaft of left tibia, initial encounter for closed fracture: Secondary | ICD-10-CM

## 2023-02-06 NOTE — Progress Notes (Addendum)
Post-Op Visit Note   Patient: Angela Keith           Date of Birth: 09-Dec-1929           MRN: [id number] Visit Date: 02/06/2023 PCP: Thana Ates, MD   Assessment & Plan:  Chief Complaint:  Chief Complaint  Patient presents with   Left Leg - Fracture, Follow-up   Visit Diagnoses:  1. Closed displaced oblique fracture of shaft of left tibia, initial encounter     Plan:   Last visit: Patient is a 89 year old female who returns for reevaluation of closed fracture of left tib-fib region.  She has been treated with serial casting over the last almost 3 months with increased callus formation noted around the fibular fracture but no callus formation noted around the tibial fracture today on radiographs.  She does have slightly decreased fracture mobility but there is still a significant amount especially with apex posterior angulation.  The fracture has some stability with avoiding apex anterior angulation.  We have discussed surgical intervention in the past but she cannot really come off her blood thinners without risking a stroke.  With the reduced fracture ability slightly, but no callus formation, plan to treat with fracture boot that she will leave on at all times aside from bathing and bone stimulator use.  Will get her set up for a bone stim and see her back in about 6 weeks for clinical recheck with Dr. August Saucer.  The ulceration behind her knee has completely healed.  The slight early pressure wound that was noted on the dorsum of her foot has also recovered as well.  If extended treatment with bone stim does not yield any callus formation, could consider trying to perform amputation while patient is on blood thinners with Dr. Lajoyce Corners but we will try this more conservative measures first.  Today's Visit: Patient returns with her daughter-in-law.  They went to the beach.  Have not had the bone stimulator approved yet.  Daughter-in-law reports increased pain in the last couple days.  She has  significantly reduced mobility at the fracture site today compared to the last visit.  There is absolutely no tenderness on exam over the fracture site though it is difficult to say exactly based on patient's nonverbal status.  She does not flex her hip as she normally does with pain when palpated over the fracture site.  She does have a new heel ulcer that is bleeding.  There is no bone that is exposed.  This seems to be responsible for the majority of her pain based on her drawing away and hip flexion when approaching the heel ulcer and when palpating this area.  We discussed options available to patient such as continuing with bone stimulator and fracture boot with treatment of the ulcer versus above knee amputation.  After discussion with Dr. Lajoyce Corners, family would like to hold off on any surgical intervention.  Seems reasonable based on the increased pain tolerance in regards to her tib-fib fracture.  Plan to treat the ulcer with Surgicel which worked well for her ulcer in the posterior aspect of the knee that she had several months ago.  Plan to follow-up in 1 month with Dr. Lajoyce Corners. Today's radiographs demonstrate >34mm fracture gap with continued nonunion and angulation of the tibial fracture  Follow-Up Instructions: No follow-ups on file.   Orders:  Orders Placed This Encounter  Procedures   XR Tibia/Fibula Left   No orders of the defined types were placed  in this encounter.   Imaging: No results found.  PMFS History: Patient Active Problem List   Diagnosis Date Noted   Closed displaced oblique fracture of shaft of left tibia 10/04/2022   UTI (urinary tract infection) 10/04/2022   QT prolongation 10/04/2022   Normocytic anemia 10/04/2022   Somnolence    Acute ischemic left MCA stroke (HCC) 08/06/2016   Acute urinary retention 08/06/2016   Aphasia    Depression 02/03/2016   Insomnia 02/03/2016   Chronic musculoskeletal pain 02/03/2016   GERD (gastroesophageal reflux disease) 12/31/2015    Pressure ulcer 12/23/2015   Paroxysmal atrial fibrillation (HCC) 06/06/2015   Embolic stroke (HCC) 03/22/2015   Left hemiparesis (HCC)    CVA (cerebral vascular accident) (HCC)    History of stroke    Spinal stenosis, lumbar region, with neurogenic claudication 01/17/2015   Lumbar stenosis 01/17/2015   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   Essential hypertension 11/10/2014   HLD (hyperlipidemia) 11/10/2014   Chronic back pain 11/08/2014   Cerebral infarction due to thrombosis of cerebral artery (HCC)    Hyperlipidemia LDL goal <70 09/07/2014   Presumed CVA (cerebral infarction) s/p IV tPA, MRI negative 09/05/2014   Hypertensive urgency 09/05/2014   Right bundle branch block 03/21/2014   Cough 10/31/2012   Elevated lipase 10/30/2012   HTN (hypertension) 10/30/2012   Hypothyroidism 10/30/2012   Past Medical History:  Diagnosis Date   Arthritis    Atrial fibrillation (HCC)    Chronic neck pain    DJD (degenerative joint disease), cervical    DJD (degenerative joint disease), lumbar    Dysphagia    GERD (gastroesophageal reflux disease)    Glaucoma    HTN (hypertension) 10/30/2012   Hyperlipidemia    Hypothyroidism    Insomnia    Nocturia    Right bundle branch block 03/21/2014   Rotoscoliosis    Severe hearing loss    Shortness of breath dyspnea    Stroke [facility])    Unspecified hypothyroidism 10/30/2012    Family History  Problem Relation Age of Onset   CAD Father    Stroke Father    Diabetes Mother    Lung cancer Other    CAD Brother     Past Surgical History:  Procedure Laterality Date   BACK SURGERY     lower back    bladder laser surgery     BREAST SURGERY     cataract surgery     EP IMPLANTABLE DEVICE N/A 01/19/2015   Procedure: Loop Recorder Insertion;  Surgeon: Marinus Maw, MD;  Location: [facility];  Service: Cardiovascular;  Laterality: N/A;   ESOPHAGOGASTRODUODENOSCOPY (EGD) WITH ESOPHAGEAL DILATION  06/08/2012   Procedure:  ESOPHAGOGASTRODUODENOSCOPY (EGD) WITH ESOPHAGEAL DILATION;  Surgeon: Charolett Bumpers, MD;  Location: [facility]OS[facility];  Service: Endoscopy;  Laterality: N/A;   facail surgery  2000   facelift and eyelid lift   left carpal tunnel surgery     LUMBAR LAMINECTOMY/DECOMPRESSION MICRODISCECTOMY N/A 01/17/2015   Procedure: Laminectomy and Foraminotomy - L2-L3 - L3-L4;  Surgeon: [name], MD;  Location: [facility];  Service: Neurosurgery;  Laterality: N/A;  Laminectomy and Foraminotomy - L2-L3 - L3-L4   TEE WITHOUT CARDIOVERSION N/A 01/19/2015   Procedure: TRANSESOPHAGEAL ECHOCARDIOGRAM (TEE);  Surgeon: Chrystie Nose, MD;  Location: [facility] ENDOSCOPY;  Service: Cardiovascular;  Laterality: N/A;   Social History   Occupational History   Occupation: retired    Comment: Veterinary surgeon, office work  Tobacco Use   Smoking  status: Never   Smokeless tobacco: Never  Vaping Use   Vaping status: Never Used  Substance and Sexual Activity   Alcohol use: No   Drug use: No   Sexual activity: Never

## 2023-02-12 ENCOUNTER — Ambulatory Visit: Payer: Medicare Other | Admitting: Orthopedic Surgery

## 2023-02-19 DIAGNOSIS — L8995 Pressure ulcer of unspecified site, unstageable: Secondary | ICD-10-CM | POA: Diagnosis not present

## 2023-02-19 DIAGNOSIS — F03B11 Unspecified dementia, moderate, with agitation: Secondary | ICD-10-CM | POA: Diagnosis not present

## 2023-02-19 DIAGNOSIS — D6869 Other thrombophilia: Secondary | ICD-10-CM | POA: Diagnosis not present

## 2023-02-19 DIAGNOSIS — F341 Dysthymic disorder: Secondary | ICD-10-CM | POA: Diagnosis not present

## 2023-02-19 DIAGNOSIS — G8194 Hemiplegia, unspecified affecting left nondominant side: Secondary | ICD-10-CM | POA: Diagnosis not present

## 2023-02-19 DIAGNOSIS — I4891 Unspecified atrial fibrillation: Secondary | ICD-10-CM | POA: Diagnosis not present

## 2023-02-19 DIAGNOSIS — I69359 Hemiplegia and hemiparesis following cerebral infarction affecting unspecified side: Secondary | ICD-10-CM | POA: Diagnosis not present

## 2023-02-19 DIAGNOSIS — N1831 Chronic kidney disease, stage 3a: Secondary | ICD-10-CM | POA: Diagnosis not present

## 2023-02-19 DIAGNOSIS — G4700 Insomnia, unspecified: Secondary | ICD-10-CM | POA: Diagnosis not present

## 2023-02-19 DIAGNOSIS — Z79899 Other long term (current) drug therapy: Secondary | ICD-10-CM | POA: Diagnosis not present

## 2023-02-19 DIAGNOSIS — E039 Hypothyroidism, unspecified: Secondary | ICD-10-CM | POA: Diagnosis not present

## 2023-02-19 DIAGNOSIS — I10 Essential (primary) hypertension: Secondary | ICD-10-CM | POA: Diagnosis not present

## 2023-03-21 ENCOUNTER — Telehealth: Payer: Self-pay

## 2023-03-21 NOTE — Telephone Encounter (Signed)
Can one of you please call patient to schedule follow up appt with Dr August Saucer? Thanks

## 2023-03-24 ENCOUNTER — Encounter: Payer: Self-pay | Admitting: Orthopedic Surgery

## 2023-03-24 ENCOUNTER — Ambulatory Visit (INDEPENDENT_AMBULATORY_CARE_PROVIDER_SITE_OTHER): Payer: Medicare Other | Admitting: Orthopedic Surgery

## 2023-03-24 ENCOUNTER — Telehealth: Payer: Self-pay

## 2023-03-24 ENCOUNTER — Other Ambulatory Visit (INDEPENDENT_AMBULATORY_CARE_PROVIDER_SITE_OTHER): Payer: Medicare Other

## 2023-03-24 DIAGNOSIS — S82232A Displaced oblique fracture of shaft of left tibia, initial encounter for closed fracture: Secondary | ICD-10-CM

## 2023-03-24 NOTE — Progress Notes (Signed)
Office Visit Note   Patient: Angela Keith           Date of Birth: [birth year]           MRN: [id number] Visit Date: 03/24/2023 Requested by: Thana Ates, MD [address] [place],  [place] [address] PCP: Thana Ates, MD  Subjective: Chief Complaint  Patient presents with   Left Leg - Follow-up    Started using bone stimulator on 02/10/23    HPI: Angela Keith is a 89 y.o. female who presents to the office reporting left tib-fib fracture.  Patient has a nonunion which has been slow to heal.  Bone stem in place.  Also has some ulcerations on the lateral and posterior aspect of the foot.  These have been treated with Citrucel.  Currently in fracture boot nonweightbearing being cared for by daughter-in-law.                ROS: All systems reviewed are negative as they relate to the chief complaint within the history of present illness.  Patient denies fevers or chills.  Assessment & Plan: Visit Diagnoses:  1. Closed displaced oblique fracture of shaft of left tibia, initial encounter     Plan: Impression is healing fracture but with some recurvatum.  I think the ulcerations emphasized on the lateral aspect of the foot and dime sized on the posterior aspect of the heel partial-thickness are being caused more or less by the fracture boot.  We will DC the fracture boot since the fracture now has a lot more inherent stability.  Citrucel reordered for the patient and she will come back in 6 weeks for final check.  Follow-Up Instructions: No follow-ups on file.   Orders:  Orders Placed This Encounter  Procedures   XR Tibia/Fibula Left   No orders of the defined types were placed in this encounter.     Procedures: No procedures performed   Clinical Data: No additional findings.  Objective: Vital Signs: There were no vitals taken for this visit.  Physical Exam:  Constitutional: Patient appears well-developed HEENT:  Head: Normocephalic Eyes:EOM are  normal Neck: Normal range of motion Cardiovascular: Normal rate Pulmonary/chest: Effort normal Neurologic: Patient is alert Skin: Skin is warm Psychiatric: Patient has normal mood and affect  Ortho Exam: Ortho exam demonstrates recurvatum deformity of the distal tib-fib region.  Foot is perfused and appears to be sensate although it is difficult to tell at this patient and her current mental status.  No calf tenderness.  Dime sized ulceration partial-thickness on the posterior aspect of the heel and lateral aspect of the foot around the base of the fifth metatarsal.  No palpable motion at the fracture site with manipulation.  Specialty Comments:  No specialty comments available.  Imaging: XR Tibia/Fibula Left  Result Date: 03/24/2023 AP lateral radiographs left tib-fib reviewed.  Hyperextension deformity is present at the distal tib-fib fracture site.  Some callus formation is present.  No new fracture.  Bones appear osteopenic    PMFS History: Patient Active Problem List   Diagnosis Date Noted   Closed displaced oblique fracture of shaft of left tibia 10/04/2022   UTI (urinary tract infection) 10/04/2022   QT prolongation 10/04/2022   Normocytic anemia 10/04/2022   Somnolence    Acute ischemic left MCA stroke (HCC) 08/06/2016   Acute urinary retention 08/06/2016   Aphasia    Depression 02/03/2016   Insomnia 02/03/2016   Chronic musculoskeletal pain 02/03/2016  GERD (gastroesophageal reflux disease) 12/31/2015   Pressure ulcer 12/23/2015   Paroxysmal atrial fibrillation (HCC) 06/06/2015   Embolic stroke (HCC) 03/22/2015   Left hemiparesis (HCC)    CVA (cerebral vascular accident) (HCC)    History of stroke    Spinal stenosis, lumbar region, with neurogenic claudication 01/17/2015   Lumbar stenosis 01/17/2015   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   Essential hypertension 11/10/2014   HLD (hyperlipidemia) 11/10/2014   Chronic back pain 11/08/2014   Cerebral  infarction due to thrombosis of cerebral artery (HCC)    Hyperlipidemia LDL goal <70 09/07/2014   Presumed CVA (cerebral infarction) s/p IV tPA, MRI negative 09/05/2014   Hypertensive urgency 09/05/2014   Right bundle branch block 03/21/2014   Cough 10/31/2012   Elevated lipase 10/30/2012   HTN (hypertension) 10/30/2012   Hypothyroidism 10/30/2012   Past Medical History:  Diagnosis Date   Arthritis    Atrial fibrillation (HCC)    Chronic neck pain    DJD (degenerative joint disease), cervical    DJD (degenerative joint disease), lumbar    Dysphagia    GERD (gastroesophageal reflux disease)    Glaucoma    HTN (hypertension) 10/30/2012   Hyperlipidemia    Hypothyroidism    Insomnia    Nocturia    Right bundle branch block 03/21/2014   Rotoscoliosis    Severe hearing loss    Shortness of breath dyspnea    Stroke [facility])    Unspecified hypothyroidism 10/30/2012    Family History  Problem Relation Age of Onset   CAD Father    Stroke Father    Diabetes Mother    Lung cancer Other    CAD Brother     Past Surgical History:  Procedure Laterality Date   BACK SURGERY     lower back    bladder laser surgery     BREAST SURGERY     cataract surgery     EP IMPLANTABLE DEVICE N/A 01/19/2015   Procedure: Loop Recorder Insertion;  Surgeon: Marinus Maw, MD;  Location: [facility];  Service: Cardiovascular;  Laterality: N/A;   ESOPHAGOGASTRODUODENOSCOPY (EGD) WITH ESOPHAGEAL DILATION  06/08/2012   Procedure: ESOPHAGOGASTRODUODENOSCOPY (EGD) WITH ESOPHAGEAL DILATION;  Surgeon: Charolett Bumpers, MD;  Location: [facility]OS[facility];  Service: Endoscopy;  Laterality: N/A;   facail surgery  2000   facelift and eyelid lift   left carpal tunnel surgery     LUMBAR LAMINECTOMY/DECOMPRESSION MICRODISCECTOMY N/A 01/17/2015   Procedure: Laminectomy and Foraminotomy - L2-L3 - L3-L4;  Surgeon: [name], MD;  Location: [facility];  Service: Neurosurgery;  Laterality: N/A;  Laminectomy and  Foraminotomy - L2-L3 - L3-L4   TEE WITHOUT CARDIOVERSION N/A 01/19/2015   Procedure: TRANSESOPHAGEAL ECHOCARDIOGRAM (TEE);  Surgeon: Chrystie Nose, MD;  Location: [facility] ENDOSCOPY;  Service: Cardiovascular;  Laterality: N/A;   Social History   Occupational History   Occupation: retired    Comment: Veterinary surgeon, office work  Tobacco Use   Smoking status: Never   Smokeless tobacco: Never  Vaping Use   Vaping status: Never Used  Substance and Sexual Activity   Alcohol use: No   Drug use: No   Sexual activity: Never

## 2023-03-24 NOTE — Telephone Encounter (Signed)
-----   Message from Burnard Bunting sent at 03/24/2023  2:10 PM EDT ----- Please follow-up with Angela Keith in  6 weeks

## 2023-03-24 NOTE — Telephone Encounter (Signed)
Please make sure f/u appt scheduled in 6 weeks with Lajoyce Corners

## 2023-05-05 ENCOUNTER — Ambulatory Visit: Payer: Medicare Other | Admitting: Orthopedic Surgery

## 2023-05-20 ENCOUNTER — Ambulatory Visit (INDEPENDENT_AMBULATORY_CARE_PROVIDER_SITE_OTHER): Payer: Medicare Other | Admitting: Orthopedic Surgery

## 2023-05-20 DIAGNOSIS — S82232A Displaced oblique fracture of shaft of left tibia, initial encounter for closed fracture: Secondary | ICD-10-CM

## 2023-05-20 DIAGNOSIS — I739 Peripheral vascular disease, unspecified: Secondary | ICD-10-CM | POA: Diagnosis not present

## 2023-05-20 DIAGNOSIS — L97521 Non-pressure chronic ulcer of other part of left foot limited to breakdown of skin: Secondary | ICD-10-CM

## 2023-05-27 ENCOUNTER — Encounter: Payer: Self-pay | Admitting: Orthopedic Surgery

## 2023-05-27 NOTE — Progress Notes (Signed)
Office Visit Note   Patient: Angela Keith           Date of Birth: 11/01/1929           MRN: [id number] Visit Date: 05/20/2023              Requested by: Thana Ates, MD [address] [place],  [place] [address] PCP: Thana Ates, MD  Chief Complaint  Patient presents with   Left Foot - Wound Check      HPI: Patient is a 89 year old woman who is seen in initial evaluation referral from Dr. August Saucer.  Patient is status post closed treatment for a distal tibia fracture.  Patient states that she developed wounds from the fracture boot.  Assessment & Plan: Visit Diagnoses:  1. PAD (peripheral artery disease) (HCC)   2. Closed displaced oblique fracture of shaft of left tibia, initial encounter   3. Non-pressure chronic ulcer of other part of left foot limited to breakdown of skin (HCC)     Plan: Continue with conservative wound care.  With patient's extensive arterial calcification it is doubtful that patient would be a revascularization candidate.  Follow-Up Instructions: Return in about 4 weeks (around 06/17/2023).   Ortho Exam  Patient is alert, oriented, no adenopathy, well-dressed, normal affect, normal respiratory effort. Examination of the radiographs shows a apex posterior distal tibia and fibular fracture about 30 degrees it appears well consolidated.  Patient has extensive calcification of the arteries to the ankle.  Patient does not have a palpable pulse.  With the Doppler patient has a monophasic anterior tibial and dorsalis pedis pulse.  Patient has an ischemic ulcer over the cuboid that is 3 cm in diameter as well as a ulcer great toe MTP joint.  No ankle-brachial indices on file.  Imaging: No results found. No images are attached to the encounter.  Labs: Lab Results  Component Value Date   HGBA1C 5.8 (H) 08/07/2016   HGBA1C 6.1 (H) 01/19/2015   HGBA1C 6.0 (H) 09/06/2014   REPTSTATUS 10/07/2022 FINAL 10/04/2022   CULT >=100,000 COLONIES/mL  CITROBACTER KOSERI (A) 10/04/2022   LABORGA CITROBACTER KOSERI (A) 10/04/2022     Lab Results  Component Value Date   ALBUMIN 3.4 (L) 10/04/2022   ALBUMIN 3.9 08/06/2016   ALBUMIN 3.4 (L) 12/23/2015    Lab Results  Component Value Date   MG 2.2 10/05/2022   MG 2.0 01/31/2015   MG 2.0 01/20/2015   Lab Results  Component Value Date   VD25OH 83 11/15/2022    No results found for: "PREALBUMIN"    Latest Ref Rng & Units 10/05/2022    5:48 AM 10/04/2022    5:30 PM 08/14/2016   12:00 AM  CBC EXTENDED  WBC 4.0 - 10.5 K/uL 7.5  7.6  7.4      RBC 3.87 - 5.11 MIL/uL 3.62  3.80    Hemoglobin 12.0 - 15.0 g/dL 78.2  95.6  21.3      HCT 36.0 - 46.0 % 33.0  34.1  44      Platelets 150 - 400 K/uL 318  353  268      NEUT# 1.7 - 7.7 K/uL  4.4    Lymph# 0.7 - 4.0 K/uL  1.9       This result is from an external source.     There is no height or weight on file to calculate BMI.  Orders:  No orders of the defined types were  placed in this encounter.  No orders of the defined types were placed in this encounter.    Procedures: No procedures performed  Clinical Data: No additional findings.  ROS:  All other systems negative, except as noted in the HPI. Review of Systems  Objective: Vital Signs: There were no vitals taken for this visit.  Specialty Comments:  No specialty comments available.  PMFS History: Patient Active Problem List   Diagnosis Date Noted   Closed displaced oblique fracture of shaft of left tibia 10/04/2022   UTI (urinary tract infection) 10/04/2022   QT prolongation 10/04/2022   Normocytic anemia 10/04/2022   Somnolence    Acute ischemic left MCA stroke (HCC) 08/06/2016   Acute urinary retention 08/06/2016   Aphasia    Depression 02/03/2016   Insomnia 02/03/2016   Chronic musculoskeletal pain 02/03/2016   GERD (gastroesophageal reflux disease) 12/31/2015   Pressure ulcer 12/23/2015   Paroxysmal atrial fibrillation (HCC) 06/06/2015   Embolic  stroke (HCC) 03/22/2015   Left hemiparesis (HCC)    CVA (cerebral vascular accident) (HCC)    History of stroke    Spinal stenosis, lumbar region, with neurogenic claudication 01/17/2015   Lumbar stenosis 01/17/2015   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   Essential hypertension 11/10/2014   HLD (hyperlipidemia) 11/10/2014   Chronic back pain 11/08/2014   Cerebral infarction due to thrombosis of cerebral artery (HCC)    Hyperlipidemia LDL goal <70 09/07/2014   Cerebral infarction (HCC) 09/05/2014   Hypertensive urgency 09/05/2014   Right bundle branch block 03/21/2014   Cough 10/31/2012   Elevated lipase 10/30/2012   HTN (hypertension) 10/30/2012   Hypothyroidism 10/30/2012   Past Medical History:  Diagnosis Date   Arthritis    Atrial fibrillation (HCC)    Chronic neck pain    DJD (degenerative joint disease), cervical    DJD (degenerative joint disease), lumbar    Dysphagia    GERD (gastroesophageal reflux disease)    Glaucoma    HTN (hypertension) 10/30/2012   Hyperlipidemia    Hypothyroidism    Insomnia    Nocturia    Right bundle branch block 03/21/2014   Rotoscoliosis    Severe hearing loss    Shortness of breath dyspnea    Stroke [facility])    Unspecified hypothyroidism 10/30/2012    Family History  Problem Relation Age of Onset   CAD Father    Stroke Father    Diabetes Mother    Lung cancer Other    CAD Brother     Past Surgical History:  Procedure Laterality Date   BACK SURGERY     lower back    bladder laser surgery     BREAST SURGERY     cataract surgery     EP IMPLANTABLE DEVICE N/A 01/19/2015   Procedure: Loop Recorder Insertion;  Surgeon: Marinus Maw, MD;  Location: [facility];  Service: Cardiovascular;  Laterality: N/A;   ESOPHAGOGASTRODUODENOSCOPY (EGD) WITH ESOPHAGEAL DILATION  06/08/2012   Procedure: ESOPHAGOGASTRODUODENOSCOPY (EGD) WITH ESOPHAGEAL DILATION;  Surgeon: Charolett Bumpers, MD;  Location: [facility] ENDOSCOPY;  Service:  Endoscopy;  Laterality: N/A;   facail surgery  2000   facelift and eyelid lift   left carpal tunnel surgery     LUMBAR LAMINECTOMY/DECOMPRESSION MICRODISCECTOMY N/A 01/17/2015   Procedure: Laminectomy and Foraminotomy - L2-L3 - L3-L4;  Surgeon: [name], MD;  Location: [facility]O [facility];  Service: Neurosurgery;  Laterality: N/A;  Laminectomy and Foraminotomy - L2-L3 - L3-L4   TEE WITHOUT CARDIOVERSION  N/A 01/19/2015   Procedure: TRANSESOPHAGEAL ECHOCARDIOGRAM (TEE);  Surgeon: Chrystie Nose, MD;  Location: [facility] ENDOSCOPY;  Service: Cardiovascular;  Laterality: N/A;   Social History   Occupational History   Occupation: retired    Comment: Veterinary surgeon, office work  Tobacco Use   Smoking status: Never   Smokeless tobacco: Never  Vaping Use   Vaping status: Never Used  Substance and Sexual Activity   Alcohol use: No   Drug use: No   Sexual activity: Never

## 2023-06-05 DIAGNOSIS — R404 Transient alteration of awareness: Secondary | ICD-10-CM | POA: Diagnosis not present

## 2023-06-05 DIAGNOSIS — I499 Cardiac arrhythmia, unspecified: Secondary | ICD-10-CM | POA: Diagnosis not present

## 2023-06-05 DIAGNOSIS — I469 Cardiac arrest, cause unspecified: Secondary | ICD-10-CM | POA: Diagnosis not present

## 2023-06-08 DEATH — deceased
